# Patient Record
Sex: Male | Born: 1970 | Race: Black or African American | Hispanic: No | Marital: Single | Smoking: Current every day smoker
Health system: Southern US, Community
[De-identification: ages and names within clinical notes are randomized; demographics above are authoritative.]

## PROBLEM LIST (undated history)

## (undated) DIAGNOSIS — F1010 Alcohol abuse, uncomplicated: Secondary | ICD-10-CM

## (undated) DIAGNOSIS — T07XXXA Unspecified multiple injuries, initial encounter: Secondary | ICD-10-CM

## (undated) DIAGNOSIS — J449 Chronic obstructive pulmonary disease, unspecified: Secondary | ICD-10-CM

## (undated) DIAGNOSIS — N529 Male erectile dysfunction, unspecified: Secondary | ICD-10-CM

## (undated) DIAGNOSIS — R12 Heartburn: Secondary | ICD-10-CM

## (undated) DIAGNOSIS — J050 Acute obstructive laryngitis [croup]: Secondary | ICD-10-CM

## (undated) DIAGNOSIS — E785 Hyperlipidemia, unspecified: Secondary | ICD-10-CM

## (undated) DIAGNOSIS — Z720 Tobacco use: Secondary | ICD-10-CM

## (undated) DIAGNOSIS — F439 Reaction to severe stress, unspecified: Secondary | ICD-10-CM

## (undated) DIAGNOSIS — Z20828 Contact with and (suspected) exposure to other viral communicable diseases: Secondary | ICD-10-CM

## (undated) DIAGNOSIS — Z9111 Patient's noncompliance with dietary regimen: Secondary | ICD-10-CM

## (undated) DIAGNOSIS — I509 Heart failure, unspecified: Secondary | ICD-10-CM

## (undated) DIAGNOSIS — Z872 Personal history of diseases of the skin and subcutaneous tissue: Secondary | ICD-10-CM

## (undated) DIAGNOSIS — K30 Functional dyspepsia: Secondary | ICD-10-CM

## (undated) DIAGNOSIS — L0291 Cutaneous abscess, unspecified: Secondary | ICD-10-CM

## (undated) DIAGNOSIS — J40 Bronchitis, not specified as acute or chronic: Secondary | ICD-10-CM

## (undated) DIAGNOSIS — E119 Type 2 diabetes mellitus without complications: Secondary | ICD-10-CM

## (undated) DIAGNOSIS — Z91119 Patient's noncompliance with dietary regimen due to unspecified reason: Secondary | ICD-10-CM

## (undated) DIAGNOSIS — L700 Acne vulgaris: Secondary | ICD-10-CM

## (undated) DIAGNOSIS — R42 Dizziness and giddiness: Secondary | ICD-10-CM

## (undated) DIAGNOSIS — J45909 Unspecified asthma, uncomplicated: Secondary | ICD-10-CM

## (undated) DIAGNOSIS — L02215 Cutaneous abscess of perineum: Secondary | ICD-10-CM

## (undated) DIAGNOSIS — I10 Essential (primary) hypertension: Secondary | ICD-10-CM

## (undated) DIAGNOSIS — E669 Obesity, unspecified: Secondary | ICD-10-CM

## (undated) DIAGNOSIS — R9431 Abnormal electrocardiogram [ECG] [EKG]: Secondary | ICD-10-CM

## (undated) HISTORY — DX: Obesity, unspecified: E66.9

## (undated) HISTORY — DX: Acute obstructive laryngitis (croup): J05.0

## (undated) HISTORY — DX: Reaction to severe stress, unspecified: F43.9

## (undated) HISTORY — DX: Tobacco use: Z72.0

## (undated) HISTORY — DX: Male erectile dysfunction, unspecified: N52.9

## (undated) HISTORY — DX: Contact with and (suspected) exposure to other viral communicable diseases: Z20.828

## (undated) HISTORY — DX: Type 2 diabetes mellitus without complications: E11.9

## (undated) HISTORY — DX: Essential (primary) hypertension: I10

## (undated) HISTORY — DX: Abnormal electrocardiogram (ECG) (EKG): R94.31

## (undated) HISTORY — DX: Patient's noncompliance with dietary regimen: Z91.11

## (undated) HISTORY — DX: Patient's noncompliance with dietary regimen due to unspecified reason: Z91.119

## (undated) HISTORY — DX: Cutaneous abscess of perineum: L02.215

## (undated) HISTORY — DX: Heartburn: R12

## (undated) HISTORY — DX: Personal history of diseases of the skin and subcutaneous tissue: Z87.2

## (undated) HISTORY — DX: Dizziness and giddiness: R42

## (undated) HISTORY — DX: Unspecified asthma, uncomplicated: J45.909

## (undated) HISTORY — DX: Chronic obstructive pulmonary disease, unspecified: J44.9

## (undated) HISTORY — DX: Unspecified multiple injuries, initial encounter: T07.XXXA

## (undated) HISTORY — DX: Acne vulgaris: L70.0

## (undated) HISTORY — DX: Bronchitis, not specified as acute or chronic: J40

## (undated) HISTORY — DX: Cutaneous abscess, unspecified: L02.91

## (undated) HISTORY — DX: Alcohol abuse, uncomplicated: F10.10

## (undated) HISTORY — DX: Functional dyspepsia: K30

## (undated) HISTORY — DX: Hyperlipidemia, unspecified: E78.5

---

## 1997-09-16 ENCOUNTER — Emergency Department (HOSPITAL_COMMUNITY): Admission: EM | Admit: 1997-09-16 | Discharge: 1997-09-16 | Payer: Self-pay | Admitting: Emergency Medicine

## 1998-07-13 ENCOUNTER — Encounter: Payer: Self-pay | Admitting: Emergency Medicine

## 1998-07-13 ENCOUNTER — Inpatient Hospital Stay (HOSPITAL_COMMUNITY): Admission: EM | Admit: 1998-07-13 | Discharge: 1998-07-15 | Payer: Self-pay | Admitting: Emergency Medicine

## 1998-07-14 ENCOUNTER — Encounter: Payer: Self-pay | Admitting: Internal Medicine

## 1998-07-15 ENCOUNTER — Encounter: Payer: Self-pay | Admitting: Internal Medicine

## 1998-08-16 ENCOUNTER — Emergency Department (HOSPITAL_COMMUNITY): Admission: EM | Admit: 1998-08-16 | Discharge: 1998-08-16 | Payer: Self-pay | Admitting: Internal Medicine

## 1999-08-04 ENCOUNTER — Encounter: Admission: RE | Admit: 1999-08-04 | Discharge: 1999-11-02 | Payer: Self-pay | Admitting: Internal Medicine

## 2007-06-13 ENCOUNTER — Encounter: Admission: RE | Admit: 2007-06-13 | Discharge: 2007-06-13 | Payer: Self-pay | Admitting: Internal Medicine

## 2009-01-08 ENCOUNTER — Encounter (HOSPITAL_COMMUNITY): Admission: RE | Admit: 2009-01-08 | Discharge: 2009-03-14 | Payer: Self-pay | Admitting: Cardiology

## 2010-02-21 ENCOUNTER — Emergency Department (HOSPITAL_COMMUNITY): Admission: EM | Admit: 2010-02-21 | Discharge: 2010-02-21 | Payer: Self-pay | Admitting: Emergency Medicine

## 2010-02-23 ENCOUNTER — Emergency Department (HOSPITAL_COMMUNITY): Admission: EM | Admit: 2010-02-23 | Discharge: 2010-02-23 | Payer: Self-pay | Admitting: Emergency Medicine

## 2010-04-20 ENCOUNTER — Emergency Department (HOSPITAL_COMMUNITY)
Admission: EM | Admit: 2010-04-20 | Discharge: 2010-04-20 | Payer: Self-pay | Source: Home / Self Care | Admitting: Emergency Medicine

## 2010-04-27 LAB — URINALYSIS, ROUTINE W REFLEX MICROSCOPIC
Bilirubin Urine: NEGATIVE
Hgb urine dipstick: NEGATIVE
Ketones, ur: NEGATIVE mg/dL
Leukocytes, UA: NEGATIVE
Nitrite: NEGATIVE
Protein, ur: 30 mg/dL — AB
Specific Gravity, Urine: 1.014 (ref 1.005–1.030)
Urine Glucose, Fasting: NEGATIVE mg/dL
Urobilinogen, UA: 0.2 mg/dL (ref 0.0–1.0)
pH: 6 (ref 5.0–8.0)

## 2010-04-27 LAB — CBC
HCT: 44.3 % (ref 39.0–52.0)
Hemoglobin: 15.2 g/dL (ref 13.0–17.0)
MCH: 31.3 pg (ref 26.0–34.0)
MCHC: 34.3 g/dL (ref 30.0–36.0)
MCV: 91.3 fL (ref 78.0–100.0)
Platelets: 325 10*3/uL (ref 150–400)
RBC: 4.85 MIL/uL (ref 4.22–5.81)
RDW: 13.5 % (ref 11.5–15.5)
WBC: 7.5 10*3/uL (ref 4.0–10.5)

## 2010-04-27 LAB — URINE MICROSCOPIC-ADD ON

## 2010-04-27 LAB — GLUCOSE, CAPILLARY
Glucose-Capillary: 142 mg/dL — ABNORMAL HIGH (ref 70–99)
Glucose-Capillary: 93 mg/dL (ref 70–99)

## 2010-04-27 LAB — DIFFERENTIAL
Basophils Absolute: 0 10*3/uL (ref 0.0–0.1)
Basophils Relative: 1 % (ref 0–1)
Eosinophils Absolute: 0.3 10*3/uL (ref 0.0–0.7)
Eosinophils Relative: 3 % (ref 0–5)
Lymphocytes Relative: 17 % (ref 12–46)
Lymphs Abs: 1.2 10*3/uL (ref 0.7–4.0)
Monocytes Absolute: 0.7 10*3/uL (ref 0.1–1.0)
Monocytes Relative: 10 % (ref 3–12)
Neutro Abs: 5.2 10*3/uL (ref 1.7–7.7)
Neutrophils Relative %: 70 % (ref 43–77)

## 2010-04-27 LAB — HEPATIC FUNCTION PANEL
ALT: 21 U/L (ref 0–53)
AST: 21 U/L (ref 0–37)
Albumin: 3.7 g/dL (ref 3.5–5.2)
Alkaline Phosphatase: 72 U/L (ref 39–117)
Bilirubin, Direct: 0.1 mg/dL (ref 0.0–0.3)
Indirect Bilirubin: 0.5 mg/dL (ref 0.3–0.9)
Total Bilirubin: 0.6 mg/dL (ref 0.3–1.2)
Total Protein: 7.9 g/dL (ref 6.0–8.3)

## 2010-04-27 LAB — POCT I-STAT, CHEM 8
BUN: 16 mg/dL (ref 6–23)
Calcium, Ion: 0.99 mmol/L — ABNORMAL LOW (ref 1.12–1.32)
Chloride: 105 mEq/L (ref 96–112)
Creatinine, Ser: 1 mg/dL (ref 0.4–1.5)
Glucose, Bld: 101 mg/dL — ABNORMAL HIGH (ref 70–99)
HCT: 47 % (ref 39.0–52.0)
Hemoglobin: 16 g/dL (ref 13.0–17.0)
Potassium: 4.6 mEq/L (ref 3.5–5.1)
Sodium: 135 mEq/L (ref 135–145)
TCO2: 29 mmol/L (ref 0–100)

## 2012-03-25 ENCOUNTER — Encounter (HOSPITAL_COMMUNITY): Payer: Self-pay | Admitting: Hematology

## 2012-03-26 ENCOUNTER — Encounter (HOSPITAL_COMMUNITY): Payer: Self-pay | Admitting: Internal Medicine

## 2012-05-19 ENCOUNTER — Encounter (HOSPITAL_COMMUNITY): Payer: Self-pay | Admitting: General Practice

## 2012-05-19 DIAGNOSIS — F439 Reaction to severe stress, unspecified: Secondary | ICD-10-CM

## 2012-05-19 DIAGNOSIS — I10 Essential (primary) hypertension: Secondary | ICD-10-CM | POA: Insufficient documentation

## 2012-05-19 DIAGNOSIS — E1165 Type 2 diabetes mellitus with hyperglycemia: Secondary | ICD-10-CM | POA: Insufficient documentation

## 2013-09-22 ENCOUNTER — Observation Stay (HOSPITAL_COMMUNITY)
Admission: EM | Admit: 2013-09-22 | Discharge: 2013-09-23 | Disposition: A | Discharge status: Home / Self Care | Payer: Self-pay | Attending: Internal Medicine | Admitting: Internal Medicine

## 2013-09-22 ENCOUNTER — Encounter (HOSPITAL_COMMUNITY): Payer: Self-pay | Admitting: Emergency Medicine

## 2013-09-22 ENCOUNTER — Emergency Department (HOSPITAL_COMMUNITY): Payer: Self-pay

## 2013-09-22 DIAGNOSIS — I10 Essential (primary) hypertension: Secondary | ICD-10-CM | POA: Diagnosis present

## 2013-09-22 DIAGNOSIS — IMO0002 Reserved for concepts with insufficient information to code with codable children: Secondary | ICD-10-CM | POA: Diagnosis present

## 2013-09-22 DIAGNOSIS — I5023 Acute on chronic systolic (congestive) heart failure: Principal | ICD-10-CM | POA: Diagnosis present

## 2013-09-22 DIAGNOSIS — E785 Hyperlipidemia, unspecified: Secondary | ICD-10-CM | POA: Insufficient documentation

## 2013-09-22 DIAGNOSIS — I160 Hypertensive urgency: Secondary | ICD-10-CM

## 2013-09-22 DIAGNOSIS — F1010 Alcohol abuse, uncomplicated: Secondary | ICD-10-CM | POA: Insufficient documentation

## 2013-09-22 DIAGNOSIS — F17200 Nicotine dependence, unspecified, uncomplicated: Secondary | ICD-10-CM | POA: Insufficient documentation

## 2013-09-22 DIAGNOSIS — J449 Chronic obstructive pulmonary disease, unspecified: Secondary | ICD-10-CM | POA: Insufficient documentation

## 2013-09-22 DIAGNOSIS — I509 Heart failure, unspecified: Secondary | ICD-10-CM | POA: Insufficient documentation

## 2013-09-22 DIAGNOSIS — R609 Edema, unspecified: Secondary | ICD-10-CM

## 2013-09-22 DIAGNOSIS — Z720 Tobacco use: Secondary | ICD-10-CM | POA: Diagnosis present

## 2013-09-22 DIAGNOSIS — J4489 Other specified chronic obstructive pulmonary disease: Secondary | ICD-10-CM | POA: Insufficient documentation

## 2013-09-22 DIAGNOSIS — Z91199 Patient's noncompliance with other medical treatment and regimen due to unspecified reason: Secondary | ICD-10-CM | POA: Insufficient documentation

## 2013-09-22 DIAGNOSIS — E1165 Type 2 diabetes mellitus with hyperglycemia: Secondary | ICD-10-CM | POA: Diagnosis present

## 2013-09-22 DIAGNOSIS — R0602 Shortness of breath: Secondary | ICD-10-CM

## 2013-09-22 DIAGNOSIS — IMO0001 Reserved for inherently not codable concepts without codable children: Secondary | ICD-10-CM | POA: Insufficient documentation

## 2013-09-22 DIAGNOSIS — N529 Male erectile dysfunction, unspecified: Secondary | ICD-10-CM | POA: Insufficient documentation

## 2013-09-22 DIAGNOSIS — Z9119 Patient's noncompliance with other medical treatment and regimen: Secondary | ICD-10-CM | POA: Insufficient documentation

## 2013-09-22 DIAGNOSIS — J811 Chronic pulmonary edema: Secondary | ICD-10-CM | POA: Diagnosis present

## 2013-09-22 LAB — CBC
HEMATOCRIT: 39 % (ref 39.0–52.0)
Hemoglobin: 13.3 g/dL (ref 13.0–17.0)
MCH: 32.3 pg (ref 26.0–34.0)
MCHC: 34.1 g/dL (ref 30.0–36.0)
MCV: 94.7 fL (ref 78.0–100.0)
Platelets: 361 10*3/uL (ref 150–400)
RBC: 4.12 MIL/uL — ABNORMAL LOW (ref 4.22–5.81)
RDW: 13.2 % (ref 11.5–15.5)
WBC: 8.2 10*3/uL (ref 4.0–10.5)

## 2013-09-22 LAB — BASIC METABOLIC PANEL
BUN: 13 mg/dL (ref 6–23)
CO2: 22 mEq/L (ref 19–32)
CREATININE: 0.89 mg/dL (ref 0.50–1.35)
Calcium: 8.9 mg/dL (ref 8.4–10.5)
Chloride: 95 mEq/L — ABNORMAL LOW (ref 96–112)
GLUCOSE: 221 mg/dL — AB (ref 70–99)
POTASSIUM: 4.7 meq/L (ref 3.7–5.3)
Sodium: 133 mEq/L — ABNORMAL LOW (ref 137–147)

## 2013-09-22 LAB — PRO B NATRIURETIC PEPTIDE: Pro B Natriuretic peptide (BNP): 1845 pg/mL — ABNORMAL HIGH (ref 0–125)

## 2013-09-22 LAB — GLUCOSE, CAPILLARY
GLUCOSE-CAPILLARY: 338 mg/dL — AB (ref 70–99)
Glucose-Capillary: 155 mg/dL — ABNORMAL HIGH (ref 70–99)

## 2013-09-22 LAB — TROPONIN I: Troponin I: <0.3 ng/mL (ref <0.30)

## 2013-09-22 LAB — I-STAT TROPONIN, ED: Troponin i, poc: 0.04 ng/mL (ref 0.00–0.08)

## 2013-09-22 LAB — HEMOGLOBIN A1C
Hgb A1c MFr Bld: 9.4 % — ABNORMAL HIGH (ref <5.7)
Mean Plasma Glucose: 223 mg/dL — ABNORMAL HIGH (ref <117)

## 2013-09-22 LAB — TSH: TSH: 2.48 u[IU]/mL (ref 0.350–4.500)

## 2013-09-22 MED ORDER — HYDRALAZINE HCL 20 MG/ML IJ SOLN
10.0000 mg | Freq: Once | INTRAMUSCULAR | Status: AC
  Administered 2013-09-22: 10 mg via INTRAVENOUS
  Filled 2013-09-22: qty 1

## 2013-09-22 MED ORDER — NITROGLYCERIN 0.4 MG SL SUBL: 0.4000 mg | SUBLINGUAL_TABLET | SUBLINGUAL | Status: DC | PRN

## 2013-09-22 MED ORDER — ASPIRIN 325 MG PO TABS
325.0000 mg | ORAL_TABLET | ORAL | Status: AC
  Administered 2013-09-22: 325 mg via ORAL
  Filled 2013-09-22: qty 1

## 2013-09-22 MED ORDER — CARVEDILOL 6.25 MG PO TABS
6.2500 mg | ORAL_TABLET | Freq: Two times a day (BID) | ORAL | Status: DC
  Administered 2013-09-22: 6.25 mg via ORAL
  Filled 2013-09-22 (×2): qty 1

## 2013-09-22 MED ORDER — ASPIRIN 81 MG PO CHEW
324.0000 mg | CHEWABLE_TABLET | ORAL | Status: AC
  Administered 2013-09-22: 324 mg via ORAL
  Filled 2013-09-22: qty 4

## 2013-09-22 MED ORDER — HEPARIN SODIUM (PORCINE) 5000 UNIT/ML IJ SOLN
5000.0000 [IU] | Freq: Three times a day (TID) | INTRAMUSCULAR | Status: DC
  Administered 2013-09-22 – 2013-09-23 (×2): 5000 [IU] via SUBCUTANEOUS
  Filled 2013-09-22 (×4): qty 1

## 2013-09-22 MED ORDER — PNEUMOCOCCAL VAC POLYVALENT 25 MCG/0.5ML IJ INJ
0.5000 mL | INJECTION | INTRAMUSCULAR | Status: AC
  Administered 2013-09-23: 0.5 mL via INTRAMUSCULAR
  Filled 2013-09-22: qty 0.5

## 2013-09-22 MED ORDER — NICOTINE 14 MG/24HR TD PT24
14.0000 mg | MEDICATED_PATCH | Freq: Every day | TRANSDERMAL | Status: DC
  Administered 2013-09-22 – 2013-09-23 (×2): 14 mg via TRANSDERMAL
  Filled 2013-09-22 (×2): qty 1

## 2013-09-22 MED ORDER — CARVEDILOL 12.5 MG PO TABS
12.5000 mg | ORAL_TABLET | Freq: Two times a day (BID) | ORAL | Status: DC
  Administered 2013-09-23: 12.5 mg via ORAL
  Filled 2013-09-22 (×3): qty 1

## 2013-09-22 MED ORDER — ASPIRIN EC 81 MG PO TBEC
81.0000 mg | DELAYED_RELEASE_TABLET | Freq: Every day | ORAL | Status: DC
  Administered 2013-09-23: 81 mg via ORAL
  Filled 2013-09-22: qty 1

## 2013-09-22 MED ORDER — INSULIN ASPART 100 UNIT/ML ~~LOC~~ SOLN
0.0000 [IU] | Freq: Three times a day (TID) | SUBCUTANEOUS | Status: DC
  Administered 2013-09-22: 7 [IU] via SUBCUTANEOUS
  Administered 2013-09-23: 5 [IU] via SUBCUTANEOUS

## 2013-09-22 MED ORDER — ASPIRIN 300 MG RE SUPP
300.0000 mg | RECTAL | Status: AC
  Filled 2013-09-22: qty 1

## 2013-09-22 MED ORDER — FUROSEMIDE 10 MG/ML IJ SOLN
40.0000 mg | Freq: Once | INTRAMUSCULAR | Status: AC
  Administered 2013-09-22: 40 mg via INTRAVENOUS
  Filled 2013-09-22: qty 4

## 2013-09-22 MED ORDER — LISINOPRIL 10 MG PO TABS
10.0000 mg | ORAL_TABLET | Freq: Every day | ORAL | Status: DC
  Administered 2013-09-22 – 2013-09-23 (×2): 10 mg via ORAL
  Filled 2013-09-22 (×2): qty 1

## 2013-09-22 MED ORDER — SPIRONOLACTONE 25 MG PO TABS
25.0000 mg | ORAL_TABLET | Freq: Every day | ORAL | Status: DC
  Administered 2013-09-22 – 2013-09-23 (×2): 25 mg via ORAL
  Filled 2013-09-22 (×2): qty 1

## 2013-09-22 MED ORDER — LISINOPRIL 10 MG PO TABS
10.0000 mg | ORAL_TABLET | Freq: Once | ORAL | Status: AC
  Administered 2013-09-22: 10 mg via ORAL
  Filled 2013-09-22: qty 1

## 2013-09-22 MED ORDER — FUROSEMIDE 20 MG PO TABS
20.0000 mg | ORAL_TABLET | Freq: Every day | ORAL | Status: DC
  Administered 2013-09-22 – 2013-09-23 (×2): 20 mg via ORAL
  Filled 2013-09-22 (×2): qty 1

## 2013-09-22 NOTE — H&P (Signed)
Date: 09/22/2013               Patient Name:  Austin Alexander MRN: [id number]  DOB: [birth year] Age / Sex: 43 y.o., male   PCP: No Pcp Per Patient              Medical Service: Internal Medicine Teaching Service              Attending Physician: Dr. Bartholomew Crews, MD    First Contact: Arrie Aran, MS 3 Pager: [phone]  Second Contact: Dr. Bing Neighbors Pager: [phone]  Third Contact Dr. Randell Loop Pager: [phone]       After Hours (After 5p/  First Contact Pager: [phone]  weekends / holidays): Second Contact Pager: [phone]   Chief Complaint: "Hard cough"  History of Present Illness:  Austin Alexander is an obese 63y gentlemen with h/o uncontrolled DM2, HTn, HLD, tobacco and alcohol abuse, who has not taken any medications for multiple weeks, and presented to the ED this morning after being woken up from sleep by a cough, felt short of breath, and became diaphoretic.  He was in his normal state of health when he went to sleep last night.  His cough was worsened by lying down, and SOB was worsening by activity.  He was found to be mildly hypoxic at about 95%, and at one reading dropped to 88%, at which point he was given 3L O2 via [place] with good response.  He was weaned back to RA shortly thereafter and remained stable at 94-95%.  He was also moderately hypertensive, with BP as high as sys 165 and dias 111. He was given a dose of IV lasix 40mg , and BPs are now trending down.  He reports improved SOB.    Meds: No current facility-administered medications for this encounter.   Current Outpatient Prescriptions  Medication Sig Dispense Refill  . glimepiride (AMARYL) 4 MG tablet Take 4 mg by mouth daily with breakfast. Take 1 tablet in the morning with breakfast or the first main meal      . Multiple Vitamins-Minerals (CENTRUM ADULTS PO) Take 1 tablet by mouth daily.        Allergies: Allergies as of 09/22/2013  . (No Known Allergies)   Past Medical History  Diagnosis Date  .  Bronchitis   . Croup   . Tobacco abuse   . Dizziness   . Abscess of perineum   . Heartburn   . Indigestion   . H/O folliculitis   . 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 Alcohol abuse   . Hypertension     variable in nature  . Hyperlipidemia   . Multiple excoriations     multifactorial in origin  . Mild obesity   . Dietary noncompliance     History of,  . Situational stress     secondary to financial issues  . Erectile dysfunction     Multifactorial. Improved with Cialis. New onset.  . Cystic acne     with acne keloidosis  . Diabetes mellitus without complication     Type 2, uncontrolled with questionable improvement  . 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 EKG, abnormal     History of, with negative cardiac evaluation by history.  [name] COPD (chronic obstructive pulmonary disease)     moderate obstruction with low vital capacity  . Asthma     History of,   History reviewed. No pertinent past  surgical history. History reviewed. No pertinent family history. History   Social History  . Marital Status: Single    Spouse Name: N/A    Number of Children: N/A  . Years of Education: N/A   Occupational History  . Not on file.   Social History Main Topics  . Smoking status: Current Every Day Smoker  . Smokeless tobacco: Not on file  . Alcohol Use: Yes  . Drug Use: Not on file  . Sexual Activity: Not on file   Other Topics Concern  . Not on file   Social History Narrative  . No narrative on file    Review of Systems:  Review of Systems  Constitutional: Negative for fever, weight loss and diaphoresis.  HENT: Positive for congestion.   Eyes: Negative for redness.  Respiratory: Positive for cough and shortness of breath. Negative for sputum production.   Cardiovascular: Negative for chest pain, palpitations, orthopnea and leg swelling.  Gastrointestinal: Negative for nausea, vomiting and abdominal pain.  Genitourinary:  Negative for dysuria and flank pain.  Neurological: Negative for dizziness and headaches.   Physical Exam: Blood pressure 151/106, pulse 109, temperature 98.1 F (36.7 C), temperature source Oral, resp. rate 19, SpO2 97.00%. Physical Exam  Constitutional: He is oriented to person, place, and time and well-developed, well-nourished, and in no distress.  Moderately obese  HENT:  Head: Normocephalic and atraumatic.  Nose: Nose normal.  Mouth/Throat: Oropharynx is clear and moist. No oropharyngeal exudate.  Eyes: Conjunctivae and EOM are normal. No scleral icterus.  Neck: Normal range of motion. Neck supple. No JVD present. No thyromegaly present.  Cardiovascular: Regular rhythm and normal heart sounds.   No murmur heard. Tachycardic to 110  Pulmonary/Chest: Effort normal. No respiratory distress. He has no wheezes.  Bilateral crackles  Abdominal: Soft. Bowel sounds are normal. He exhibits distension. He exhibits no mass. There is no tenderness. There is no rebound and no guarding.  Musculoskeletal: Normal range of motion. He exhibits no edema.  Lymphadenopathy:    He has no cervical adenopathy.  Neurological: He is alert and oriented to person, place, and time.  Skin: Skin is warm and dry. He is not diaphoretic.  Psychiatric: Affect normal.    Lab results: Results for Austin Alexander, Austin Alexander (MRN [id number]) as of 09/22/2013 08:04  Ref. Range 09/22/2013 03:42 09/22/2013 03:44  Sodium Latest Range: 137-147 mEq/L  133 (L)  Potassium Latest Range: 3.7-5.3 mEq/L  4.7  Chloride Latest Range: 96-112 mEq/L  95 (L)  CO2 Latest Range: 19-32 mEq/L  22  BUN Latest Range: 6-23 mg/dL  13  Creatinine Latest Range: 0.50-1.35 mg/dL  0.89  Calcium Latest Range: 8.4-10.5 mg/dL  8.9  GFR calc non Af Amer Latest Range: >90 mL/min  >90  GFR calc Af Amer Latest Range: >90 mL/min  >90  Glucose Latest Range: 70-99 mg/dL  221 (H)  Troponin i, poc Latest Range: 0.00-0.08 ng/mL 0.04   Pro B Natriuretic peptide (BNP)  Latest Range: 0-125 pg/mL  1845.0 (H)  WBC Latest Range: 4.0-10.5 K/uL  8.2  RBC Latest Range: 4.22-5.81 MIL/uL  4.12 (L)  Hemoglobin Latest Range: 13.0-17.0 g/dL  13.3  HCT Latest Range: 39.0-52.0 %  39.0  MCV Latest Range: 78.0-100.0 fL  94.7  MCH Latest Range: 26.0-34.0 pg  32.3  MCHC Latest Range: 30.0-36.0 g/dL  34.1  RDW Latest Range: 11.5-15.5 %  13.2  Platelets Latest Range: 150-400 K/uL  361   Imaging results:  Dg Chest 2 View  09/22/2013  CLINICAL DATA:  Shortness of breath and chest pain. History of smoking.  EXAM: CHEST  2 VIEW  COMPARISON:  Chest radiograph performed 04/20/2010  FINDINGS: The lungs are well expanded. Vascular congestion is noted, with increased interstitial markings, raising concern for mild pulmonary edema. No definite pleural effusion or pneumothorax is seen.  The heart is borderline normal in size. No acute osseous abnormalities are identified.  IMPRESSION: Vascular congestion, with increased interstitial markings, raising concern for mild pulmonary edema.   Electronically Signed   By: Garald Balding M.D.   On: 09/22/2013 06:37    Other results: EKG: Initial EKG showed sinus tachycardia, with new T wave abnormalities in lead I, avL.  Repeat EKG shows additional biphasic T wave in V5  Assessment & Plan by Problem: Active Problems:   Pulmonary edema   Acute on chronic heart failure  Acute on Chronic Heart Failure - Stress test from Sept 2010 Est EF = 46%.  Clinical signs of crackles on lung exam, tachycardia, history of acute SOB, diaphoresis, EKG changes in contiguous leads, necessitates r/o of cardiac ischemia.  It is reassuring that pt does not report CP.  BNP 1845, Troponins neg x2. - ASA 325mg  STAT  ?Hypertensive Urgency - Pt is just below borderline to meet criteria for urgency. He reports 2 weeks of HTN med noncompliance 2/2 financial considerations.  On exam, he is fluid overloaded with crackles at lung bases.  He has received 40mg  IV lasix and is  currently diuresing well with total 0.9L urine so far.  Plan to observe him for improvement of Sxs, and reassess the need for further diuresis. -hydralazine 10mg  -lisinopril 10mg   Pulmonary edema - Improved SOB reported; anticipate further improvement with current diuresis plan, and HTN control.   Health Maintenance - Pt states he does not have a PCP he sees regularly for his DM, HTN, HLD, but has seen Dr. Amadeo Garnet at the [facility] on [address]   Attempts to contact this clinic were unsuccessful.  Mr. Mcniff was encouraged to call at the next available business hour to set up an appointment, or walk-in to visit and discuss his chronic medical conditions.  In the mean-time, it would benefit him to come to a one-time visit in our clinic.    This is a Careers information officer Note.  The care of the patient was discussed with Dr. Denton Brick and the assessment and plan was formulated with their assistance.  Please see their note for official documentation of the patient encounter.   Signed: Barbie Haggis, Med Student 09/22/2013, 8:06 AM

## 2013-09-22 NOTE — ED Notes (Signed)
Attempted report 

## 2013-09-22 NOTE — ED Notes (Signed)
Per Patient: States he woke up from his sleep with SOB, patient reports associated chest tightness. Pt reports he has had a cough for several days. Pt is able to speak in complete sentences at this time. NAD at the moment.

## 2013-09-22 NOTE — H&P (Signed)
Date: 09/22/2013               Patient Name:  Austin Alexander MRN: [id number]  DOB: 01/04/1971 Age / Sex: 43 y.o., male   PCP: No Pcp Per Patient         Medical Service: Internal Medicine Teaching Service         Attending Physician: Dr. Bartholomew Crews, MD    First Contact: Dr. Denton Brick Pager: [phone]  Second Contact: Dr. Hayes Ludwig Pager: [phone]       After Hours (After 5p/  First Contact Pager: [phone]  weekends / holidays): Second Contact Pager: [phone]   Chief Complaint: Cough  History of Present Illness: 34 y o male with PMH of DM and HTN, presented today with c/o cough of 2 weeks duration, patient said he coughed and threw up the food he had recently eaten. Pt says he has been coughing at baseline, he calls it smokers cough, as he has been smoking cigarette for over 10 years, at least one pack per day. Cough- productive of whitish sputum, Pt denies SOB- says he can lie flat, denies leg swelling or increase in abdominal swelling, has tightness, but no change from prior he says. Pt denies Chest pain. As per Ed notes, pt came in with complaints of SOB, but at the tim eof our examination pt had already passed at least 940mls of urine, also documented was chest pain which pt denied having. Pt last saw his PCP 2 years ago, thinks he was placed on lisinopril- say the medication sounds familiar, is also on glipizide. Last refill of his  medications was a few months ago. Pt will like to continue following up with Dr Mardene Speak. [place]. Pt says his challenge is that he has no insurance and no money, otherwise he is ready to follow up as an outpatient.  Meds: No current facility-administered medications for this encounter.   Current Outpatient Prescriptions  Medication Sig Dispense Refill  . glimepiride (AMARYL) 4 MG tablet Take 4 mg by mouth daily with breakfast. Take 1 tablet in the morning with breakfast or the first main meal      . Multiple Vitamins-Minerals (CENTRUM ADULTS PO) Take  1 tablet by mouth daily.        Allergies: Allergies as of 09/22/2013  . (No Known Allergies)   Past Medical History  Diagnosis Date  . Bronchitis   . Croup   . Tobacco abuse   . Dizziness   . Abscess of perineum   . Heartburn   . Indigestion   . H/O folliculitis   . 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 Alcohol abuse   . Hypertension     variable in nature  . Hyperlipidemia   . Multiple excoriations     multifactorial in origin  . Mild obesity   . Dietary noncompliance     History of,  . Situational stress     secondary to financial issues  . Erectile dysfunction     Multifactorial. Improved with Cialis. New onset.  . Cystic acne     with acne keloidosis  . Diabetes mellitus without complication     Type 2, uncontrolled with questionable improvement  . 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 EKG, abnormal     History of, with negative cardiac evaluation by history.  [name] COPD (chronic obstructive pulmonary disease)     moderate obstruction with low vital capacity  .  Asthma     History of,   History reviewed. No pertinent past surgical history. History reviewed. No pertinent family history. History   Social History  . Marital Status: Single    Spouse Name: N/A    Number of Children: N/A  . Years of Education: N/A   Occupational History  . Not on file.   Social History Main Topics  . Smoking status: Current Every Day Smoker  . Smokeless tobacco: Not on file  . Alcohol Use: Yes  . Drug Use: Not on file  . Sexual Activity: Not on file   Other Topics Concern  . Not on file   Social History Narrative  . No narrative on file    Review of Systems: CONSTITUTIONAL- No Fever, or change in appetite. HEAD- No Headache or dizziness. Mouth/throat- No Sorethroat. RESPIRATORY- Some congestion from sleeping under a fan, he says. CARDIAC- No Palpitations or chest pain. GI- No vomiting, diarrhoea, constipation.  URINARY- No  dysuria.  NEUROLOGIC- No Numbness, syncope, seizures or burning.  Physical Exam: Blood pressure 137/98, pulse 105, temperature 98.1 F (36.7 C), temperature source Oral, resp. rate 14, SpO2 95.00%. GENERAL- alert, co-operative, appears as stated age, not in any distress, was on O2 by nasal canula- sat 97%- on 4.5L , turned off O2 sats ranged between- 93-97%, mostly- 95% . HEENT- Atraumatic, normocephalic, PERRL, EOMI, oral mucosa appears moist,poor dentition generally. No cervical LN enlargement, thyroid does not appear enlarged, neck supple. CARDIAC- Tachycardic- low 100s, regular, no murmurs, rubs or gallops. RESP- Moving equal volumes of air, bilateral basilar crackles, no wheezes.  ABDOMEN- Soft, nontender, no guarding or rebound, no palpable masses or organomegaly, bowel sounds present. BACK- Normal curvature of the spine no CVA tenderness. NEURO- No obvious Cr N abnormality, Normal strenght upper and lower extremities-. EXTREMITIES- pulse 2+, symmetric, no pedal edema. SKIN- Warm, dry, No rash or lesion. PSYCH- Normal mood and affect, appropriate thought content and speech.  Lab results: Basic Metabolic Panel:  Recent Labs  09/22/13 0344  NA 133*  K 4.7  CL 95*  CO2 22  GLUCOSE 221*  BUN 13  CREATININE 0.89  CALCIUM 8.9   CBC:  Recent Labs  09/22/13 0344  WBC 8.2  HGB 13.3  HCT 39.0  MCV 94.7  PLT 361   BNP:  Recent Labs  09/22/13 0344  PROBNP 1845.0*   Imaging results:  Dg Chest 2 View  09/22/2013   CLINICAL DATA:  Shortness of breath and chest pain. History of smoking.  EXAM: CHEST  2 VIEW  COMPARISON:  Chest radiograph performed 04/20/2010  FINDINGS: The lungs are well expanded. Vascular congestion is noted, with increased interstitial markings, raising concern for mild pulmonary edema. No definite pleural effusion or pneumothorax is seen.  The heart is borderline normal in size. No acute osseous abnormalities are identified.  IMPRESSION: Vascular  congestion, with increased interstitial markings, raising concern for mild pulmonary edema.   Electronically Signed   By: Garald Balding M.D.   On: 09/22/2013 06:37    Other results: EKG: rate- 123bpm, sinus tachycardia, large P waves- II, III, AVF, axis- normal, T wave inversions- 1 and AVL only. No ST Abnormalities. Compared to prior EKG- 2012- some t wave flattening- AVL, ? repol abnormalities. Repeat EKG in th eEd- T wave inversions prominent with some changes in V4 and V5.  Assessment & Plan by Problem:  Pulmonary edema- Likely from congestive heart failure which appears to be more of left heart failure at this time,  most likely from chronically uncontrolled HTN, due to medication noncompliance. This is consistent with pts SOB- present initially on admission and Cough, tachypnea and tachycardia, crackles present on exam, BNP- elevated- 1845, hyponatremia- na- 133( Hypervolemic hyponatremia). Chest Xray Suggestive of mild pulmonary edema. Pt weight was not taken in the Ed. But say his weight is about 220.  Pt was given 40mg  of IV lasix an hour before we saw him, and pt had already put out at least 1L of urine, with improvement of symptoms. Also consider ACS as provoking factor for CHF, EKG with some T wave inversions- Lat leads, still present on repeat after diuresis, not present in prior- 2012, also with c/o chest pain, documented by Ed physician- no family hx of heart attack, pt says he has had a stress tests before which was negative. I- stat troponin was negative. Pt kidney function is normal, with Normal, Cr and GFR.  - Admit to in pt- Tele - Monitor in and out - Bmet in the am. -  Troponin X1- negative, cycle trops.  - Close follow up with PCP is important. - EKG in the morning. - Aspirin 325 start and 81 daily. - TSH  Heart failure- No echo on file. Clinically appears to be R - Echo - Daily weights - frusemide- 20mg  daily.   Diabetes- Pts blood sugar on arrival in the ED- 221. Hx of  Diabetes. Home meds- Glimepiride- 4mg  daily. - SSI-  - HgBa1c. - Lipid profile  HTN- Blood pressure elevated- highest recorded in the Ed- 165/108.  Pt thinks he was on lisinopril in the past, likely, since he has diabetes. - Coreg- 6.25mg  daily.   Smoking- Pt ready to quit. Will like to ready nicotine patches.   DVT ppx-   Dispo: Disposition is deferred at this time, awaiting improvement of current medical problems.  The patient does have a current PCP (No Pcp Per Patient) and does need an [facility] hospital follow-up appointment after discharge.  The patient does not know have transportation limitations that hinder transportation to clinic appointments.  Signed: Jenetta Downer, MD 09/22/2013, 8:44 AM

## 2013-09-22 NOTE — Progress Notes (Signed)
Echo Lab  2D Echocardiogram completed.  [place], RDCS 09/22/2013 3:40 PM

## 2013-09-22 NOTE — ED Provider Notes (Addendum)
CSN: [id number]     Arrival date & time 09/22/13  0303 History   First MD Initiated Contact with Patient 09/22/13 0533     Chief Complaint  Patient presents with  . Chest Pain  . Shortness of Breath     (Consider location/radiation/quality/duration/timing/severity/associated sxs/prior Treatment) HPI  This patient is a pleasant 43 year old diabetic man who smokes about 1.5 cigarettes per day. He also has a history of hypertension and has been out of his medications for the past week. He comes in today with complaints of shortness of breath. He was feeling well last night when he went to sleep. However, he awoke shortly prior to arrival coughing and had persistent shortness of breath. He denies chest pain, fever, palpitations and wheezing. He denies history of similar symptoms.  Denies any recent cocaine use. He has no history of coronary artery disease. He has no risk factors or history for VTE. He feels more short of breath when he is up and ambulating. He has not had any lower extremity edema. No abdominal pain.  Past Medical History  Diagnosis Date  . Bronchitis   . Croup   . Tobacco abuse   . Dizziness   . Abscess of perineum   . Heartburn   . Indigestion   . H/O folliculitis   . 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 Alcohol abuse   . Hypertension     variable in nature  . Hyperlipidemia   . Multiple excoriations     multifactorial in origin  . Mild obesity   . Dietary noncompliance     History of,  . Situational stress     secondary to financial issues  . Erectile dysfunction     Multifactorial. Improved with Cialis. New onset.  . Cystic acne     with acne keloidosis  . Diabetes mellitus without complication     Type 2, uncontrolled with questionable improvement  . 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 EKG, abnormal     History of, with negative cardiac evaluation by history.  [name] COPD (chronic obstructive pulmonary disease)      moderate obstruction with low vital capacity  . Asthma     History of,   History reviewed. No pertinent past surgical history. History reviewed. No pertinent family history. History  Substance Use Topics  . Smoking status: Current Every Day Smoker  . Smokeless tobacco: Not on file  . Alcohol Use: Yes    Review of Systems Ten point review of symptoms performed and is negative with the exception of symptoms noted above.     Allergies  Review of patient's allergies indicates no known allergies.  Home Medications   Prior to Admission medications   Medication Sig Start Date End Date Taking? Authorizing Provider  glimepiride (AMARYL) 4 MG tablet Take 4 mg by mouth daily with breakfast. Take 1 tablet in the morning with breakfast or the first main meal   Yes Historical Provider, MD  Multiple Vitamins-Minerals (CENTRUM ADULTS PO) Take 1 tablet by mouth daily.   Yes Historical Provider, MD   BP 153/111  Pulse 114  Temp(Src) 98.1 F (36.7 C) (Oral)  Resp 21  SpO2 97% Physical Exam Gen: well developed and well nourished appearing Head: NCAT Eyes: PERL, EOMI Nose: no epistaixis or rhinorrhea Mouth/throat: mucosa is moist and pink Neck: supple, no stridor Lungs: sats 90% on RA, RR 24/min, CTA B, no wheezing, rhonchi or rales  CV: Rapid and regular with pulse of 112 beats per minute, no murmur,, good distal pulses.  Abd: soft, notender, nondistended Back: no ttp, no cva ttp Skin: warm and dry Ext: no edema, normal to inspection Neuro: CN ii-xii grossly intact, no focal deficits Psyche; normal affect,  calm and cooperative.  ED Course  Procedures (including critical care time)    Results for orders placed during the hospital encounter of 09/22/13 (from the past 24 hour(s))  I-STAT TROPOININ, ED     Status: None   Collection Time    09/22/13  3:42 AM      Result Value Ref Range   Troponin i, poc 0.04  0.00 - 0.08 ng/mL   Comment 3           CBC     Status: Abnormal    Collection Time    09/22/13  3:44 AM      Result Value Ref Range   WBC 8.2  4.0 - 10.5 K/uL   RBC 4.12 (*) 4.22 - 5.81 MIL/uL   Hemoglobin 13.3  13.0 - 17.0 g/dL   HCT 39.0  39.0 - 52.0 %   MCV 94.7  78.0 - 100.0 fL   MCH 32.3  26.0 - 34.0 pg   MCHC 34.1  30.0 - 36.0 g/dL   RDW 13.2  11.5 - 15.5 %   Platelets 361  150 - 400 K/uL  BASIC METABOLIC PANEL     Status: Abnormal   Collection Time    09/22/13  3:44 AM      Result Value Ref Range   Sodium 133 (*) 137 - 147 mEq/L   Potassium 4.7  3.7 - 5.3 mEq/L   Chloride 95 (*) 96 - 112 mEq/L   CO2 22  19 - 32 mEq/L   Glucose, Bld 221 (*) 70 - 99 mg/dL   BUN 13  6 - 23 mg/dL   Creatinine, Ser 0.89  0.50 - 1.35 mg/dL   Calcium 8.9  8.4 - 10.5 mg/dL   GFR calc non Af Amer >90  >90 mL/min   GFR calc Af Amer >90  >90 mL/min  PRO B NATRIURETIC PEPTIDE     Status: Abnormal   Collection Time    09/22/13  3:44 AM      Result Value Ref Range   Pro B Natriuretic peptide (BNP) 1845.0 (*) 0 - 125 pg/mL   EKG: sius tach, 123 bpm, no acute ischemic changes, normal intervals, normal axis, normal qrs complex  CRITICAL CARE Performed by: Elyn Peers   Total critical care time: 83m  Critical care time was exclusive of separately billable procedures and treating other patients.  Critical care was necessary to treat or prevent imminent or life-threatening deterioration.  Critical care was time spent personally by me on the following activities: development of treatment plan with patient and/or surrogate as well as nursing, discussions with consultants, evaluation of patient's response to treatment, examination of patient, obtaining history from patient or surrogate, ordering and performing treatments and interventions, ordering and review of laboratory studies, ordering and review of radiographic studies, pulse oximetry and re-evaluation of patient's condition.  MDM   DDX: pneumonia, pleural effusion, PE, pneumothorax, pulmonary edema.   CXR  consistent with pulmonary edema. This is supported by elevated BNP level and HTN. We are treating with hydralazine IV for afterload reduction and IV Lasix. IM on call paged to request admission.   Elyn Peers, MD 09/22/13 [phone]  Case discussed with IM resident  on call who has accepted the patient on behalf of Dr. Melina Copa.   Elyn Peers, MD 09/22/13 [phone]

## 2013-09-22 NOTE — H&P (Signed)
  Date: 09/22/2013  Patient name: Carina Alire  Medical record number: [id number]  Date of birth: Jan 14, 1971   I have seen and evaluated Johny Blamer and discussed their care with the Residency Team. Mr Lutes was at his son's college graduation the day prior to admission and was in normal health. He woke up suddenly coughing, which he states is not unusual for him as he has a smoker's cough, but then realized he had SOB which made him come to the ED. In the ED, he was found to be in mild HF and was tx with IV lasix and diuresed 900 cc urine. When we saw the pt, he had no dyspnea, was lying flat. He also denied CP. He states he can walk up 3 flights or stairs and lift 50 lbs without CP or dyspnea. He has not noticed any weight gain, edema. He had a stress test in 2010 that showed EF 46% and no ischemia. This was similar to results in 2000.  He has DM, non insulin dep, and HTN. He has not seen a physician in over one year. He cont to take his amaryl as he had refills.   Vitals were reviewed. H tach and regular. No MRG. Lungs good air flow with slight crackles R base. Abd + BS. Ext no edema.  Labs and imaging reviewed.  Assessment and Plan: I have seen and evaluated the patient as outlined above. I agree with the formulated Assessment and Plan as detailed in the residents' admission note, with the following changes:   1. Acute heart failure - his EF was slightly low in 2010 but he was never started on HF meds. He has responded well to diuresis and will be transitioned to PO. ACEI and BB will be added. Will get ECHO today. He will need education on daily weights and diet. He has F/U arranged next week with new MD.  2. DM II - agree with A1C. SSI. Likely will need metformin at D/C as I doubt his A1C is controlled.  3. HTN - to be D/C'd on ACEI and BB and lasix. He may need additinoal agents but will be F/U'd as outpt.  4,. Inverted T waves in lateral leads - pre-test prob for AMI is very low but  he needs R/O with Trop I.   Bartholomew Crews, MD 6/13/20151:46 PM

## 2013-09-22 NOTE — Consult Note (Signed)
Reason for Consult: New-onset congestive heart failure/atypical chest pain Referring Physician: Dr. Larey Dresser  Austin Alexander is an 43 y.o. male.  HPI: Patient is 43 year old male with past medical history significant for hypertensive heart disease with asymptomatic LV systolic dysfunction, type 2 diabetes mellitus, alcohol abuse, tobacco abuse, was admitted earlier this morning because of sudden onset of paroxysmal nocturnal dyspnea associated with coughing. Denies orthopnea or leg swelling. Also complains of vague pleuritic chest pain which lasted a few minutes. Denies any exertional angina states he is very active and lifts boxes up to 50 pounds without difficulty. Patient states he ran out of medication for last few weeks. Denies any palpitation lightheadedness or syncope. States has been drinking heavy 1 pint of hard liquor 4-5 days per week. And continues to smoke one pack per day. Patient had a stress test in 2000 and in 2010 which showed no evidence of ischemia with EF of 46%. Patient was noted to be in mild congestive heart failure requiring IV Lasix with improvement in his breathing. MI was ruled out by serial enzymes 2-D echo showed EF of approximately 30%. EKG done in the ER showed sinus tach with minor T wave inversion in high lateral leads. Patient presently denies any complaints and feels that all his symptoms are because of not taking his medications and drinking.  Past Medical History  Diagnosis Date  . Bronchitis   . Croup   . Tobacco abuse   . Dizziness   . Abscess of perineum   . Heartburn   . Indigestion   . H/O folliculitis   . 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 Alcohol abuse   . Hypertension     variable in nature  . Hyperlipidemia   . Multiple excoriations     multifactorial in origin  . Mild obesity   . Dietary noncompliance     History of,  . Situational stress     secondary to financial issues  . Erectile dysfunction     Multifactorial.  Improved with Cialis. New onset.  . Cystic acne     with acne keloidosis  . Diabetes mellitus without complication     Type 2, uncontrolled with questionable improvement  . 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 EKG, abnormal     History of, with negative cardiac evaluation by history.  [name] COPD (chronic obstructive pulmonary disease)     moderate obstruction with low vital capacity  . Asthma     History of,    History reviewed. No pertinent past surgical history.  History reviewed. No pertinent family history.  Social History:  reports that he has been smoking.  He does not have any smokeless tobacco history on file. He reports that he drinks alcohol. His drug history is not on file.  Allergies: No Known Allergies  Medications: I have reviewed the patient's current medications.  Results for orders placed during the hospital encounter of 09/22/13 (from the past 48 hour(s))  I-STAT TROPOININ, ED     Status: None   Collection Time    09/22/13  3:42 AM      Result Value Ref Range   Troponin i, poc 0.04  0.00 - 0.08 ng/mL   Comment 3            Comment: Due to the release kinetics of cTnI,     a negative result within the first hours     of the  onset of symptoms does not rule out     myocardial infarction with certainty.     If myocardial infarction is still suspected,     repeat the test at appropriate intervals.  CBC     Status: Abnormal   Collection Time    09/22/13  3:44 AM      Result Value Ref Range   WBC 8.2  4.0 - 10.5 K/uL   RBC 4.12 (*) 4.22 - 5.81 MIL/uL   Hemoglobin 13.3  13.0 - 17.0 g/dL   HCT 39.0  39.0 - 52.0 %   MCV 94.7  78.0 - 100.0 fL   MCH 32.3  26.0 - 34.0 pg   MCHC 34.1  30.0 - 36.0 g/dL   RDW 13.2  11.5 - 15.5 %   Platelets 361  150 - 400 K/uL  BASIC METABOLIC PANEL     Status: Abnormal   Collection Time    09/22/13  3:44 AM      Result Value Ref Range   Sodium 133 (*) 137 - 147 mEq/L   Potassium 4.7  3.7 - 5.3 mEq/L    Comment: HEMOLYSIS AT THIS LEVEL MAY AFFECT RESULT   Chloride 95 (*) 96 - 112 mEq/L   CO2 22  19 - 32 mEq/L   Glucose, Bld 221 (*) 70 - 99 mg/dL   BUN 13  6 - 23 mg/dL   Creatinine, Ser 0.89  0.50 - 1.35 mg/dL   Calcium 8.9  8.4 - 10.5 mg/dL   GFR calc non Af Amer >90  >90 mL/min   GFR calc Af Amer >90  >90 mL/min   Comment: (NOTE)     The eGFR has been calculated using the CKD EPI equation.     This calculation has not been validated in all clinical situations.     eGFR's persistently <90 mL/min signify possible Chronic Kidney     Disease.  PRO B NATRIURETIC PEPTIDE     Status: Abnormal   Collection Time    09/22/13  3:44 AM      Result Value Ref Range   Pro B Natriuretic peptide (BNP) 1845.0 (*) 0 - 125 pg/mL  TROPONIN I     Status: None   Collection Time    09/22/13 11:16 AM      Result Value Ref Range   Troponin I <0.30  <0.30 ng/mL   Comment:            Due to the release kinetics of cTnI,     a negative result within the first hours     of the onset of symptoms does not rule out     myocardial infarction with certainty.     If myocardial infarction is still suspected,     repeat the test at appropriate intervals.  GLUCOSE, CAPILLARY     Status: Abnormal   Collection Time    09/22/13  4:02 PM      Result Value Ref Range   Glucose-Capillary 338 (*) 70 - 99 mg/dL   Comment 1 Documented in Chart     Comment 2 Notify RN    TROPONIN I     Status: None   Collection Time    09/22/13  4:58 PM      Result Value Ref Range   Troponin I <0.30  <0.30 ng/mL   Comment:            Due to the release kinetics of cTnI,     a  negative result within the first hours     of the onset of symptoms does not rule out     myocardial infarction with certainty.     If myocardial infarction is still suspected,     repeat the test at appropriate intervals.  TSH     Status: None   Collection Time    09/22/13  4:58 PM      Result Value Ref Range   TSH 2.480  0.350 - 4.500 uIU/mL    Dg  Chest 2 View  09/22/2013   CLINICAL DATA:  Shortness of breath and chest pain. History of smoking.  EXAM: CHEST  2 VIEW  COMPARISON:  Chest radiograph performed 04/20/2010  FINDINGS: The lungs are well expanded. Vascular congestion is noted, with increased interstitial markings, raising concern for mild pulmonary edema. No definite pleural effusion or pneumothorax is seen.  The heart is borderline normal in size. No acute osseous abnormalities are identified.  IMPRESSION: Vascular congestion, with increased interstitial markings, raising concern for mild pulmonary edema.   Electronically Signed   By: Garald Balding M.D.   On: 09/22/2013 06:37    Review of Systems  Constitutional: Negative for fever and chills.  HENT: Negative for hearing loss.   Eyes: Negative for blurred vision, double vision and photophobia.  Respiratory: Positive for cough and shortness of breath. Negative for hemoptysis, sputum production and wheezing.   Cardiovascular: Positive for chest pain and PND. Negative for palpitations, orthopnea, claudication and leg swelling.  Gastrointestinal: Negative for nausea, vomiting and abdominal pain.  Genitourinary: Negative for dysuria.  Neurological: Negative for dizziness and headaches.   Blood pressure 152/109, pulse 104, temperature 97.8 F (36.6 C), temperature source Oral, resp. rate 20, height 6' (1.829 m), weight 101.197 kg (223 lb 1.6 oz), SpO2 100.00%. Physical Exam  Constitutional: He is oriented to person, place, and time.  HENT:  Head: Normocephalic and atraumatic.  Eyes: Conjunctivae are normal. Pupils are equal, round, and reactive to light. Left eye exhibits no discharge. No scleral icterus.  Neck: Normal range of motion. Neck supple. No JVD present. No tracheal deviation present. No thyromegaly present.  Cardiovascular: Normal rate and regular rhythm.   Murmur (soft systolic murmur and S3 gallop noted) heard. Respiratory: Effort normal and breath sounds normal. No  respiratory distress. He has no wheezes. He has no rales.  GI: Soft. Bowel sounds are normal. He exhibits no distension. There is no tenderness. There is no rebound.  Musculoskeletal: He exhibits no edema and no tenderness.  Neurological: He is alert and oriented to person, place, and time.    Assessment/Plan: Resolving decompensated congestive heart failure secondary to depressed LV systolic function probably secondary to hypertensive heart disease/alcohol abuse rule out ischemia Status post atypical chest pain Uncontrolled hypertension Type 2 diabetes mellitus Alcohol abuse Tobacco abuse Plan Agree with present management Increase carvedilol as per orders We'll add aldosterone inhibitor as per orders Discussed with patient regarding left cardiac cath as yet marked depressed LV systolic function versus repeating the stress test patient states he wants to be treated medically at this point and plans to take his medication regularly. Patient has been consult regarding diet smoking cessation and alcohol cessation. We'll repeat his 2-D echo her as outpatient in 2-3 months and increase his beta blockers and ACE inhibitors as blood pressure tolerates as outpatient. If his EF remains 35% refer him for ICD. Check lipid panel  Austin Alexander 09/22/2013, 7:02 PM

## 2013-09-23 DIAGNOSIS — E119 Type 2 diabetes mellitus without complications: Secondary | ICD-10-CM

## 2013-09-23 DIAGNOSIS — F17200 Nicotine dependence, unspecified, uncomplicated: Secondary | ICD-10-CM

## 2013-09-23 DIAGNOSIS — I509 Heart failure, unspecified: Secondary | ICD-10-CM

## 2013-09-23 LAB — BASIC METABOLIC PANEL
BUN: 12 mg/dL (ref 6–23)
CHLORIDE: 96 meq/L (ref 96–112)
CO2: 25 meq/L (ref 19–32)
CREATININE: 0.9 mg/dL (ref 0.50–1.35)
Calcium: 8.9 mg/dL (ref 8.4–10.5)
GFR calc Af Amer: >90 mL/min (ref >90)
GFR calc non Af Amer: >90 mL/min (ref >90)
GLUCOSE: 281 mg/dL — AB (ref 70–99)
Potassium: 4.5 mEq/L (ref 3.7–5.3)
Sodium: 135 mEq/L — ABNORMAL LOW (ref 137–147)

## 2013-09-23 LAB — LIPID PANEL
CHOL/HDL RATIO: 3.5 ratio
Cholesterol: 190 mg/dL (ref 0–200)
HDL: 55 mg/dL (ref >39)
LDL Cholesterol: 96 mg/dL (ref 0–99)
Triglycerides: 196 mg/dL — ABNORMAL HIGH (ref <150)
VLDL: 39 mg/dL (ref 0–40)

## 2013-09-23 LAB — GLUCOSE, CAPILLARY
GLUCOSE-CAPILLARY: 289 mg/dL — AB (ref 70–99)
GLUCOSE-CAPILLARY: 252 mg/dL — AB (ref 70–99)

## 2013-09-23 LAB — TROPONIN I

## 2013-09-23 MED ORDER — CARVEDILOL 12.5 MG PO TABS: 12.5000 mg | ORAL_TABLET | Freq: Two times a day (BID) | ORAL | Status: DC

## 2013-09-23 MED ORDER — LISINOPRIL 10 MG PO TABS: 10.0000 mg | ORAL_TABLET | Freq: Every day | ORAL | Status: DC

## 2013-09-23 MED ORDER — SIMVASTATIN 40 MG PO TABS: 40.0000 mg | ORAL_TABLET | Freq: Every day | ORAL | Status: AC

## 2013-09-23 MED ORDER — METFORMIN HCL 500 MG PO TABS: 500.0000 mg | ORAL_TABLET | Freq: Two times a day (BID) | ORAL | Status: DC

## 2013-09-23 MED ORDER — SPIRONOLACTONE 25 MG PO TABS: 25.0000 mg | ORAL_TABLET | Freq: Every day | ORAL | Status: DC

## 2013-09-23 MED ORDER — ASPIRIN 81 MG PO TBEC: 81.0000 mg | DELAYED_RELEASE_TABLET | Freq: Every day | ORAL | Status: DC

## 2013-09-23 NOTE — Progress Notes (Signed)
  I have seen and examined the patient, and reviewed the daily progress note by Arrie Aran, MS 3 and discussed the care of the patient with them. Please see my progress note from 09/23/2013 for further details regarding assessment and plan.    Signed:  Jenetta Downer, MD 09/23/2013, 12:46 PM

## 2013-09-23 NOTE — Discharge Summary (Addendum)
Name: Austin Alexander MRN: [id number] DOB: Jun 17, 1970 43 y.o. PCP: No Pcp Per Patient  Date of Admission: 09/22/2013  3:13 AM Date of Discharge: 09/25/2013 Attending Physician: No att. providers found  Discharge Diagnosis:  Principal Problem:   Acute on chronic heart failure Active Problems:   HTN (hypertension)   Uncontrolled diabetes mellitus   Pulmonary edema   Tobacco abuse  Discharge Medications:   Medication List    STOP taking these medications       glimepiride 4 MG tablet  Commonly known as:  AMARYL      TAKE these medications       aspirin 81 MG EC tablet  Take 1 tablet (81 mg total) by mouth daily.     carvedilol 12.5 MG tablet  Commonly known as:  COREG  Take 1 tablet (12.5 mg total) by mouth 2 (two) times daily with a meal.     CENTRUM ADULTS PO  Take 1 tablet by mouth daily.     lisinopril 10 MG tablet  Commonly known as:  PRINIVIL,ZESTRIL  Take 1 tablet (10 mg total) by mouth daily.     metFORMIN 500 MG tablet  Commonly known as:  GLUCOPHAGE  Take 1 tablet (500 mg total) by mouth 2 (two) times daily with a meal.     simvastatin 40 MG tablet  Commonly known as:  ZOCOR  Take 1 tablet (40 mg total) by mouth daily.     spironolactone 25 MG tablet  Commonly known as:  ALDACTONE  Take 1 tablet (25 mg total) by mouth daily.        Disposition and follow-up:   [name] was discharged from [facility] in Good condition.  At the hospital follow up visit please address:  1.  Smoking cessation. Compliance with all his medications- Diabetic, statin and HTN. Any features of fluid overload. Has pt followed up and established care with his PCP.   2.  Labs / imaging needed at time of follow-up: Bmet- K, as pt is on ACE-i and Spironolactone.  3.  Pending labs/ test needing follow-up: None  Follow-up Appointments: Follow-up Information   Follow up with [facility], MD. Schedule an appointment as soon as possible for a visit  on 09/28/2013. (Please schedule an appointment within a week.)    Specialty:  General Practice   Contact information:   [place] [id number] [phone]       Discharge Instructions: Discharge Instructions   Diet - low sodium heart healthy    Complete by:  As directed      Discharge instructions    Complete by:  As directed   We have prescribed several new medications for you.  For your blood pressure 2 medications- Coreg/carvedilol, One tablet  2ce a day  And lisinopril, take one tablet once a day.     For your diabetes- Metformin- one tablet 2ce a day.  For your heart failure- Spironolactone one tablet 1ce a day. This medication also helps your blood pressure.  For your cholesterol- Simvastatin- one tablet once a day, in the evening.  All these medications are very important, it is therefore very very  important you follow up with your primary care doctor within a week. These medication doses will be adjusted by your PCP.  We have attached some materials on smoking cessation, please read them, and follow up with your PCP, about stopping smoking.     Increase activity slowly    Complete  by:  As directed            Consultations: Treatment Team:  Clent Demark, MD  Procedures Performed:  Dg Chest 2 View  09/22/2013   CLINICAL DATA:  Shortness of breath and chest pain. History of smoking.  EXAM: CHEST  2 VIEW  COMPARISON:  Chest radiograph performed 04/20/2010  FINDINGS: The lungs are well expanded. Vascular congestion is noted, with increased interstitial markings, raising concern for mild pulmonary edema. No definite pleural effusion or pneumothorax is seen.  The heart is borderline normal in size. No acute osseous abnormalities are identified.  IMPRESSION: Vascular congestion, with increased interstitial markings, raising concern for mild pulmonary edema.   Electronically Signed   By: Garald Balding M.D.   On: 09/22/2013 06:37    2D Echo:  Study Conclusions  - Left ventricle: The cavity size was moderately dilated. Systolic function was severely reduced. The estimated ejection fraction was in the range of 25% to 30%. Regional wall motion abnormalities cannot be excluded. - Mitral valve: There was mild regurgitation. - Left atrium: The atrium was moderately to severely dilated. - Right ventricle: The cavity size was mildly dilated. Wall thickness was normal.  Cardiac Cath: None  Admission HPI: Chief Complaint: Cough and initial complaints of chest pain and SOB.   History of Present Illness: 86 y o male with PMH of DM and HTN, presented today with c/o cough of about 2 weeks duration, patient said he coughed and threw up the food he had recently eaten. Pt says he has been coughing at baseline, he calls it smokers cough, as he has been smoking cigarette for over 10 years, at least one pack per day. Cough- productive of whitish sputum, Pt says he initially had some SOB which he reported had resolved at the time of our evaluation, but pt said he could lie flat, denies leg swelling or increase in abdominal swelling, has tightness, but no change from prior he says. Pt initially came in with some complaints of Chest pain, but denied this at the time of our evaluation. As per Ed notes, pt came in with complaints of SOB, but at the time of our examination pt had already passed at least 967mls of urine, also documented was chest pain which pt denied having. No family hx of premature heart attacks. Pt last saw his PCP 2 years ago, thinks he was placed on lisinopril- say the medication sounds familiar, is also on glipizide. Last refill of his medications was a few months ago. Pt will like to continue following up with Dr Mardene Speak. [place]. Pt says his challenge is that he has no insurance and no money, otherwise he is ready to follow up as an outpatient.  Physical Exam:  Blood pressure 137/98, pulse 105, temperature 98.1 F (36.7 C), temperature  source Oral, resp. rate 14, SpO2 95.00%.  GENERAL- alert, co-operative, appears as stated age, not in any distress, was on O2 by nasal canula- sat 97%- on 4.5L , turned off O2 sats ranged between- 93-97%, mostly- 95% .  HEENT- Atraumatic, normocephalic, PERRL, EOMI, oral mucosa appears moist,poor dentition generally. No cervical LN enlargement, thyroid does not appear enlarged, neck supple.  CARDIAC- Tachycardic- low 100s, regular, no murmurs, rubs or gallops.  RESP- Moving equal volumes of air, bilateral basilar crackles, no wheezes.  ABDOMEN- Soft, nontender, no guarding or rebound, no palpable masses or organomegaly, bowel sounds present.  BACK- Normal curvature of the spine no CVA tenderness.  NEURO- No obvious  Cr N abnormality, Normal strenght upper and lower extremities-.  EXTREMITIES- pulse 2+, symmetric, no pedal edema.  SKIN- Warm, dry, No rash or lesion.  PSYCH- Normal mood and affect, appropriate thought content and speech.   Hospital Course by problem list:   Acute on Chronic CHF (Systolic) - With pulmonary edema- Pt presented with SOB, with Cough, with crackles on exam, had a transient supplemental oxygen requirement. BNP done- 1845, trops X3- negative, EKG- with T wave inversions in lateral Leads, more prominent on repeat after diuresis, Chest xray suggestive of mild congestion, Consequent echo showed EF- 25-30%, wall motion abnormalities not excluded. This was decreased compared to Nuc med stress test in 2010, with EF 40-45%. He was given 40mg  IV Lasix, with reported improvement of symptoms, and given 2-20mg  PO doses over the next 24 hours.  Cardiology was consulted, impression- Compensated congestive heart failure secondary to depressed LV systolic function probably secondary to hypertensive heart disease/alcohol abuse rule out ischemia and recommendations, based on pts preference was for medical therapy, with ACE-i, Spironolactone, and beta-blockers, repeat 2D Echo in 2-3 months, and   If EF <35% refer for ICD. On discharge, pts SOB had resolved, and did not require O2 at rest or with ambulation, He was discharged in stable condition, to follow up with cardiology on the 22nd of June-2015. With the following prescriptions: - ASA 81mg  daily, Coreg 12.5mg  BID, Lisinopril 10mg  daily, Spironolactone 25mg  daily (K- on discharge- 4.5).. Pt wanted to follow up with his previous PCP- Dr Amadeo Garnet. He was therefore instructed to call and follow up within a week.  Diabetes- Hg A1c found to be 9.4- 09/22/2013. He had previously been noncompliant on Glimepiride. As there was no clear contraindication to Metformin therapy, he was discharged on 500mg  BID, and told to discuss his diabetic regimen at his PCP follow-up visit. Given his CVD risk profile, high intensity statin therapy was indicated, with a goal of LDL<70. His LDL was found to be 96, TG- 196; discharged with simvastatin 40mg  daily.   HTN- BP on presentation was 165/108; acceptable level achieved on discharge- 140/94 after Lasix therapy and initiating blood pressure regimen described above (Lisinopril, Coreg, Spironolactone). Counseled that these medications may need to be titrated up at his outpatient PCP follow up visit.   Smoking cessation- Patient reported effort and desire to quit smoking. He was given Scientist, clinical (histocompatibility and immunogenetics).  Addendum- 09/30/2013- Acute on Chronic Systolic heart failure.  Discharge Vitals:   BP 148/94  Pulse 105  Temp(Src) 98.2 F (36.8 C) (Oral)  Resp 19  Ht 6' (1.829 m)  Wt 221 lb 8 oz (100.472 kg)  BMI 30.03 kg/m2  SpO2 100%  Discharge Labs:  No results found for this or any previous visit (from the past 24 hour(s)).  Signed: Jenetta Downer, MD 09/25/2013, 11:39 AM   Time Spent on Discharge: 35 minutes Services Ordered on Discharge: None Equipment Ordered on Discharge: None

## 2013-09-23 NOTE — Discharge Instructions (Signed)
Smoking Cessation Quitting smoking is important to your health and has many advantages. However, it is not always easy to quit since nicotine is a very addictive drug. Often times, people try 3 times or more before being able to quit. This document explains the best ways for you to prepare to quit smoking. Quitting takes hard work and a lot of effort, but you can do it. ADVANTAGES OF QUITTING SMOKING  You will live longer, feel better, and live better.  Your body will feel the impact of quitting smoking almost immediately.  Within 20 minutes, blood pressure decreases. Your pulse returns to its normal level.  After 8 hours, carbon monoxide levels in the blood return to normal. Your oxygen level increases.  After 24 hours, the chance of having a heart attack starts to decrease. Your breath, hair, and body stop smelling like smoke.  After 48 hours, damaged nerve endings begin to recover. Your sense of taste and smell improve.  After 72 hours, the body is virtually free of nicotine. Your bronchial tubes relax and breathing becomes easier.  After 2 to 12 weeks, lungs can hold more air. Exercise becomes easier and circulation improves.  The risk of having a heart attack, stroke, cancer, or lung disease is greatly reduced.  After 1 year, the risk of coronary heart disease is cut in half.  After 5 years, the risk of stroke falls to the same as a nonsmoker.  After 10 years, the risk of lung cancer is cut in half and the risk of other cancers decreases significantly.  After 15 years, the risk of coronary heart disease drops, usually to the level of a nonsmoker.  If you are pregnant, quitting smoking will improve your chances of having a healthy baby.  The people you live with, especially any children, will be healthier.  You will have extra money to spend on things other than cigarettes. QUESTIONS TO THINK ABOUT BEFORE ATTEMPTING TO QUIT You may want to talk about your answers with your  caregiver.  Why do you want to quit?  If you tried to quit in the past, what helped and what did not?  What will be the most difficult situations for you after you quit? How will you plan to handle them?  Who can help you through the tough times? Your family? Friends? A caregiver?  What pleasures do you get from smoking? What ways can you still get pleasure if you quit? Here are some questions to ask your caregiver:  How can you help me to be successful at quitting?  What medicine do you think would be best for me and how should I take it?  What should I do if I need more help?  What is smoking withdrawal like? How can I get information on withdrawal? GET READY  Set a quit date.  Change your environment by getting rid of all cigarettes, ashtrays, matches, and lighters in your home, car, or work. Do not let people smoke in your home.  Review your past attempts to quit. Think about what worked and what did not. GET SUPPORT AND ENCOURAGEMENT You have a better chance of being successful if you have help. You can get support in many ways.  Tell your family, friends, and co-workers that you are going to quit and need their support. Ask them not to smoke around you.  Get individual, group, or telephone counseling and support. Programs are available at [employer] and health centers. Call your local health department for  information about programs in your area.  Spiritual beliefs and practices may help some smokers quit.  Download a "quit meter" on your computer to keep track of quit statistics, such as how long you have gone without smoking, cigarettes not smoked, and money saved.  Get a self-help book about quitting smoking and staying off of tobacco. Chualar yourself from urges to smoke. Talk to someone, go for a walk, or occupy your time with a task.  Change your normal routine. Take a different route to work. Drink tea instead of coffee.  Eat breakfast in a different place.  Reduce your stress. Take a hot bath, exercise, or read a book.  Plan something enjoyable to do every day. Reward yourself for not smoking.  Explore interactive web-based programs that specialize in helping you quit. GET MEDICINE AND USE IT CORRECTLY Medicines can help you stop smoking and decrease the urge to smoke. Combining medicine with the above behavioral methods and support can greatly increase your chances of successfully quitting smoking.  Nicotine replacement therapy helps deliver nicotine to your body without the negative effects and risks of smoking. Nicotine replacement therapy includes nicotine gum, lozenges, inhalers, nasal sprays, and skin patches. Some may be available over-the-counter and others require a prescription.  Antidepressant medicine helps people abstain from smoking, but how this works is unknown. This medicine is available by prescription.  Nicotinic receptor partial agonist medicine simulates the effect of nicotine in your brain. This medicine is available by prescription. Ask your caregiver for advice about which medicines to use and how to use them based on your health history. Your caregiver will tell you what side effects to look out for if you choose to be on a medicine or therapy. Carefully read the information on the package. Do not use any other product containing nicotine while using a nicotine replacement product.  RELAPSE OR DIFFICULT SITUATIONS Most relapses occur within the first 3 months after quitting. Do not be discouraged if you start smoking again. Remember, most people try several times before finally quitting. You may have symptoms of withdrawal because your body is used to nicotine. You may crave cigarettes, be irritable, feel very hungry, cough often, get headaches, or have difficulty concentrating. The withdrawal symptoms are only temporary. They are strongest when you first quit, but they will go away within  10 14 days. To reduce the chances of relapse, try to:  Avoid drinking alcohol. Drinking lowers your chances of successfully quitting.  Reduce the amount of caffeine you consume. Once you quit smoking, the amount of caffeine in your body increases and can give you symptoms, such as a rapid heartbeat, sweating, and anxiety.  Avoid smokers because they can make you want to smoke.  Do not let weight gain distract you. Many smokers will gain weight when they quit, usually less than 10 pounds. Eat a healthy diet and stay active. You can always lose the weight gained after you quit.  Find ways to improve your mood other than smoking. FOR MORE INFORMATION  www.smokefree.gov

## 2013-09-23 NOTE — Progress Notes (Signed)
Subjective:  Patient denies any chest pain or shortness of breath. States feels much better since admission. Less palpitation lightheadedness or syncope. Denies PND orthopnea or leg swelling.  Objective:  Vital Signs in the last 24 hours: Temp:  [97.8 F (36.6 C)-99.3 F (37.4 C)] 98.2 F (36.8 C) (06/14 0523) Pulse Rate:  [98-108] 105 (06/14 0700) Resp:  [16-31] 19 (06/14 0523) BP: (117-156)/(79-112) 148/94 mmHg (06/14 0700) SpO2:  [95 %-100 %] 100 % (06/14 0523) Weight:  [100.472 kg (221 lb 8 oz)-101.197 kg (223 lb 1.6 oz)] 100.472 kg (221 lb 8 oz) (06/14 0523)  Intake/Output from previous day: 06/13 0701 - 06/14 0700 In: 240 [P.O.:240] Out: 4350 [Urine:4350] Intake/Output from this shift: Total I/O In: 240 [P.O.:240] Out: -   Physical Exam: Neck: no adenopathy, no carotid bruit, no JVD and supple, symmetrical, trachea midline Lungs: clear to auscultation bilaterally Heart: regular rate and rhythm, S1, S2 normal and Soft systolic murmur noted no history gallop Abdomen: soft, non-tender; bowel sounds normal; no masses,  no organomegaly Extremities: extremities normal, atraumatic, no cyanosis or edema  Lab Results:  Recent Labs  09/22/13 0344  WBC 8.2  HGB 13.3  PLT 361    Recent Labs  09/22/13 0344 09/23/13 0840  NA 133* 135*  K 4.7 4.5  CL 95* 96  CO2 22 25  GLUCOSE 221* 281*  BUN 13 12  CREATININE 0.89 0.90    Recent Labs  09/22/13 1658 09/22/13 2320  TROPONINI <0.30 <0.30   Hepatic Function Panel No results found for this basename: PROT, ALBUMIN, AST, ALT, ALKPHOS, BILITOT, BILIDIR, IBILI,  in the last 72 hours  Recent Labs  09/23/13 0438  CHOL 190   No results found for this basename: PROTIME,  in the last 72 hours  Imaging: Imaging results have been reviewed and Dg Chest 2 View  09/22/2013   CLINICAL DATA:  Shortness of breath and chest pain. History of smoking.  EXAM: CHEST  2 VIEW  COMPARISON:  Chest radiograph performed 04/20/2010   FINDINGS: The lungs are well expanded. Vascular congestion is noted, with increased interstitial markings, raising concern for mild pulmonary edema. No definite pleural effusion or pneumothorax is seen.  The heart is borderline normal in size. No acute osseous abnormalities are identified.  IMPRESSION: Vascular congestion, with increased interstitial markings, raising concern for mild pulmonary edema.   Electronically Signed   By: Garald Balding M.D.   On: 09/22/2013 06:37    Cardiac Studies:  Assessment/Plan:  Compensated congestive heart failure secondary to depressed LV systolic function probably secondary to hypertensive heart disease/alcohol abuse rule out ischemia  Status post atypical chest pain  Uncontrolled hypertension  Type 2 diabetes mellitus  Alcohol abuse  Tobacco abuse  Plan Okay to discharge home Will up titrate beta blockers and ACE inhibitor  as outpatient as tolerated. Will arrange for 2-D echo in to 3 months as outpatient hopefully his LV function will improve. Discussed again with patient regarding lifestyle modification smoking cessation and alcohol cessation and diet. Heart failure instructions also have been given.  LOS: 1 day    Terasa Orsini N 09/23/2013, 11:14 AM

## 2013-09-23 NOTE — H&P (Signed)
  I have seen and examined the patient, and reviewed the daily progress note by Arrie Aran, MS 3 and discussed the care of the patient with them. Please see my progress note from 09/23/2013 for further details regarding assessment and plan.    Signed:  Jenetta Downer, MD 09/23/2013, 12:45 PM

## 2013-09-23 NOTE — Progress Notes (Signed)
Heart Failure Navigator Consult Note  Presentation: Austin Alexander is a 43 year old male---was at his son's college graduation the day prior to admission and was in normal health. He woke up suddenly coughing, which he states is not unusual for him as he has a smoker's cough, but then realized he had SOB which made him come to the ED. In the ED, he was found to be in mild HF and was tx with IV lasix and diuresed 900 cc urine. When we saw the pt, he had no dyspnea, was lying flat. He also denied CP. He states he can walk up 3 flights or stairs and lift 50 lbs without CP or dyspnea. He has not noticed any weight gain, edema. He had a stress test in 2010 that showed EF 46% and no ischemia. This was similar to results in 2000.   Past Medical History  Diagnosis Date  . Bronchitis   . Croup   . Tobacco abuse   . Dizziness   . Abscess of perineum   . Heartburn   . Indigestion   . H/O folliculitis   . 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 Alcohol abuse   . Hypertension     variable in nature  . Hyperlipidemia   . Multiple excoriations     multifactorial in origin  . Mild obesity   . Dietary noncompliance     History of,  . Situational stress     secondary to financial issues  . Erectile dysfunction     Multifactorial. Improved with Cialis. New onset.  . Cystic acne     with acne keloidosis  . Diabetes mellitus without complication     Type 2, uncontrolled with questionable improvement  . 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 EKG, abnormal     History of, with negative cardiac evaluation by history.  [name] COPD (chronic obstructive pulmonary disease)     moderate obstruction with low vital capacity  . Asthma     History of,    History   Social History  . Marital Status: Single    Spouse Name: N/A    Number of Children: N/A  . Years of Education: N/A   Social History Main Topics  . Smoking status: Current Every Day Smoker  . Smokeless  tobacco: None  . Alcohol Use: Yes  . Drug Use: None  . Sexual Activity: None   Other Topics Concern  . None   Social History Narrative  . None    ECHO:Study Conclusions--09/12/13  - Left ventricle: The cavity size was moderately dilated. Systolic function was severely reduced. The estimated ejection fraction was in the range of 25% to 30%. Regional wall motion abnormalities cannot be excluded. - Mitral valve: There was mild regurgitation. - Left atrium: The atrium was moderately to severely dilated. - Right ventricle: The cavity size was mildly dilated. Wall thickness was normal.  Transthoracic echocardiography. M-mode, complete 2D, spectral Doppler, and color Doppler. Birthdate: Patient birthdate: Feb 06, 1971. Age: Patient is 43 yr old. Sex: Gender: male. Blood pressure: 127/87 Patient status: Inpatient. Study date: Study date: 09/22/2013. Study time: 03:30 PM. Location: Bedside.   BNP    Component Value Date/Time   PROBNP 1845.0* 09/22/2013 0344    Education Assessment and Provision:  Detailed education and instructions provided on heart failure disease management including the following:  Signs and symptoms of Heart Failure When to call the physician Importance of daily  weights Low sodium diet Fluid restriction Medication management Anticipated future follow-up appointments  Patient education given on each of the above topics.  Patient acknowledges understanding and acceptance of all instructions.  He seems open to the topics described above.  We briefly discussed his history of hypertension and how that can play a role in HF.  He does acknowledge issues with medication compliance in the past and not doing as he "was supposed to".  I reinforced the importance of taking medications exactly as prescribed.  I also encouraged him to get a scale to do daily weights.    Education Materials:  "Living Better With Heart Failure" Booklet, Daily Weight Tracker Tool and Heart  Failure Educational Video.   High Risk Criteria for Readmission and/or Poor Patient Outcomes:   EF <30%- Yes 25-30%   2 or more admissions in 6 months- No  Difficult social situation- No-  Demonstrates medication noncompliance- Yes--says he has not always taken in the past.    Barriers of Care:  Knowledge of HF and compliance to recommendations  Discharge Planning:   Plans to discharge to home alone--girlfriend is there some.  He says he plans to follow-up with Dr. Terrence Dupont at discharge.  No appointment noted as of yet.  He may benefit from outpatient follow-up with the HF clinic --as affording medications may be an issue.  He could also benefit from seeing the outpatient HF SW.

## 2013-09-23 NOTE — Progress Notes (Signed)
Subjective: No complaints today. Seen by cardiology yesterday. Pt feels okay, ready to go home. Says she will follow up as needed with his PCP.   Objective: Vital signs in last 24 hours: Filed Vitals:   09/22/13 2127 09/23/13 0224 09/23/13 0523 09/23/13 0700  BP: 132/95 141/104 148/105 148/94  Pulse: 99 108 106 105  Temp: 98.8 F (37.1 C) 99.3 F (37.4 C) 98.2 F (36.8 C)   TempSrc: Oral Oral Oral   Resp: 18 19 19    Height:      Weight:   221 lb 8 oz (100.472 kg)   SpO2: 99% 99% 100%    Weight change:   Intake/Output Summary (Last 24 hours) at 09/23/13 0938 Last data filed at 09/23/13 0831  Gross per 24 hour  Intake    480 ml  Output   2550 ml  Net  -2070 ml   General appearance: alert, cooperative and no distress Head: Normocephalic, without obvious abnormality, atraumatic Lungs: clear to auscultation bilaterally Heart: regular rate and rhythm, S1, S2 normal, no murmur, click, rub or gallop Abdomen: soft, non-tender; bowel sounds normal; no masses,  no organomegaly Extremities: extremities normal, atraumatic, no cyanosis or edema Pulses: 2+ and symmetric Lab Results: Basic Metabolic Panel:  Recent Labs Lab 09/22/13 0344 09/23/13 0840  NA 133* 135*  K 4.7 4.5  CL 95* 96  CO2 22 25  GLUCOSE 221* 281*  BUN 13 12  CREATININE 0.89 0.90  CALCIUM 8.9 8.9   CBC:  Recent Labs Lab 09/22/13 0344  WBC 8.2  HGB 13.3  HCT 39.0  MCV 94.7  PLT 361   Cardiac Enzymes:  Recent Labs Lab 09/22/13 1116 09/22/13 1658 09/22/13 2320  TROPONINI <0.30 <0.30 <0.30   BNP:  Recent Labs Lab 09/22/13 0344  PROBNP 1845.0*   CBG:  Recent Labs Lab 09/22/13 1602 09/22/13 2006 09/23/13 0708  GLUCAP 338* 155* 289*   Hemoglobin A1C:  Recent Labs Lab 09/22/13 1658  HGBA1C 9.4*   Fasting Lipid Panel:  Recent Labs Lab 09/23/13 0438  CHOL 190  HDL 55  LDLCALC 96  TRIG 196*  CHOLHDL 3.5   Thyroid Function Tests:  Recent Labs Lab 09/22/13 1658    TSH 2.480   Micro Results: No results found for this or any previous visit (from the past 240 hour(s)). Studies/Results: Dg Chest 2 View  09/22/2013   CLINICAL DATA:  Shortness of breath and chest pain. History of smoking.  EXAM: CHEST  2 VIEW  COMPARISON:  Chest radiograph performed 04/20/2010  FINDINGS: The lungs are well expanded. Vascular congestion is noted, with increased interstitial markings, raising concern for mild pulmonary edema. No definite pleural effusion or pneumothorax is seen.  The heart is borderline normal in size. No acute osseous abnormalities are identified.  IMPRESSION: Vascular congestion, with increased interstitial markings, raising concern for mild pulmonary edema.   Electronically Signed   By: Garald Balding M.D.   On: 09/22/2013 06:37   Medications: I have reviewed the patient's current medications. Scheduled Meds: . aspirin EC  81 mg Oral Daily  . carvedilol  12.5 mg Oral BID WC  . furosemide  20 mg Oral Daily  . heparin  5,000 Units Subcutaneous 3 times per day  . insulin aspart  0-9 Units Subcutaneous TID WC  . lisinopril  10 mg Oral Daily  . nicotine  14 mg Transdermal Daily  . pneumococcal 23 valent vaccine  0.5 mL Intramuscular Tomorrow-1000  . spironolactone  25 mg Oral Daily  Continuous Infusions:  PRN Meds:.nitroGLYCERIN Assessment/Plan:  Acute on Chronic CHF- Echo done yesterday- EF- 25-30%, wall motion abnormalities not excluded. Seen by cards yesterday. Cards recs- Repeat 2D Echo, as pt has elected for medical therapy. If EF <35% refer for ICD. Follow up with cards in 2-3 months. Pulmonary edema has resolved - Discharge home today  - Cards recs appreciated- Pt to be discharged home with spironolactone, lisinopril and Coreg, to be up titrated as an out-pt.  - K- 4.5 today, this should be followed up closely. - Cont Aspirin- 81mg  daily  Diabetes- HgBa1c- 9.4 09/22/2013. Previously on Glimeperide. - Discharge home on Metformin- 500mg  BID. -  LDL- 96, not at goal, will start statin- Simvastatin at 40mg  daily.  HTN- Acceptable Bp on discharge- 140/94. But now on 3 blood pressure medications. - Titrate as out-pt.  - Discharge home on Lisinopril 10mg  daily, Coreg- 6.25mg  daily, and spironolactone- 25mg  daily.  Smoking cessation- Scientist, clinical (histocompatibility and immunogenetics) given.   Dispo: Disposition is deferred at this time, awaiting improvement of current medical problems.  Anticipated discharge in approximately today. The patient does have a current PCP (No Pcp Per Patient) and does need an [facility] hospital follow-up appointment after discharge.  The patient does not know have transportation limitations that hinder transportation to clinic appointments.  .Services Needed at time of discharge: Y = Yes, Blank = No PT:   OT:   RN:   Equipment:   Other:     LOS: 1 day   Jenetta Downer, MD 09/23/2013, 9:38 AM

## 2013-09-23 NOTE — Progress Notes (Signed)
Utilization Review Completed.Austin Alexander T6/14/2015  

## 2013-09-23 NOTE — Progress Notes (Signed)
Subjective: Austin Alexander was feeling great this morning, with no SOB.  Spoke with Dr. Terrence Dupont yesterday regarding therapeutic options for his reduced EF.  Would prefer to manage medically and follow up in clinic.  Pending lab results, he can go home today.  Objective: Vital signs in last 24 hours: Filed Vitals:   09/22/13 2127 09/23/13 0224 09/23/13 0523 09/23/13 0700  BP: 132/95 141/104 148/105 148/94  Pulse: 99 108 106 105  Temp: 98.8 F (37.1 C) 99.3 F (37.4 C) 98.2 F (36.8 C)   TempSrc: Oral Oral Oral   Resp: 18 19 19    Height:      Weight:   100.472 kg (221 lb 8 oz)   SpO2: 99% 99% 100%    Weight change:   Intake/Output Summary (Last 24 hours) at 09/23/13 0943 Last data filed at 09/23/13 0831  Gross per 24 hour  Intake    480 ml  Output   2050 ml  Net  -1570 ml   Physical Exam  Constitutional: He is oriented to person, place, and time and well-developed, well-nourished, and in no distress.  Eyes: Conjunctivae are normal.  Cardiovascular: Normal rate, regular rhythm and normal heart sounds.  Exam reveals no gallop and no friction rub.   No murmur heard. Pulmonary/Chest: Effort normal and breath sounds normal.  Neurological: He is alert and oriented to person, place, and time.  Skin: Skin is warm and dry.   Lab Results: Results for TEDRIC, COLLINGWOOD (MRN [id number]) as of 09/23/2013 09:58  Ref. Range 09/22/2013 03:44 09/23/2013 08:40  Sodium Latest Range: 137-147 mEq/L 133 (L) 135 (L)  Potassium Latest Range: 3.7-5.3 mEq/L 4.7 4.5  Chloride Latest Range: 96-112 mEq/L 95 (L) 96  CO2 Latest Range: 19-32 mEq/L 22 25  BUN Latest Range: 6-23 mg/dL 13 12  Creatinine Latest Range: 0.50-1.35 mg/dL 0.89 0.90  Calcium Latest Range: 8.4-10.5 mg/dL 8.9 8.9  GFR calc non Af Amer Latest Range: >90 mL/min >90 >90  GFR calc Af Amer Latest Range: >90 mL/min >90 >90  Glucose Latest Range: 70-99 mg/dL 221 (H) 281 (H)   Results for COBEE, MIAO (MRN [id number]) as of 09/23/2013 09:58  Ref. Range 09/23/2013 04:38  Cholesterol Latest Range: 0-200 mg/dL 190  Triglycerides Latest Range: <150 mg/dL 196 (H)  HDL Latest Range: >39 mg/dL 55  LDL (calc) Latest Range: 0-99 mg/dL 96  VLDL Latest Range: 0-40 mg/dL 39  Total CHOL/HDL Ratio No range found 3.5    Hemoglobin A1C Latest Range: <5.7 % 9.4 (H)   TSH Latest Range: 0.350-4.500 uIU/mL 2.480    Ref. Range 09/22/2013 11:16 09/22/2013 16:58 09/22/2013 23:20  Troponin I Latest Range: <0.30 ng/mL <0.30 <0.30 <0.30    Micro Results: No results found for this or any previous visit (from the past 240 hour(s)). Studies/Results: Dg Chest 2 View  09/22/2013   CLINICAL DATA:  Shortness of breath and chest pain. History of smoking.  EXAM: CHEST  2 VIEW  COMPARISON:  Chest radiograph performed 04/20/2010  FINDINGS: The lungs are well expanded. Vascular congestion is noted, with increased interstitial markings, raising concern for mild pulmonary edema. No definite pleural effusion or pneumothorax is seen.  The heart is borderline normal in size. No acute osseous abnormalities are identified.  IMPRESSION: Vascular congestion, with increased interstitial markings, raising concern for mild pulmonary edema.   Electronically Signed   By: Garald Balding M.D.   On: 09/22/2013 06:37   Medications: I have reviewed the patient's current medications. Scheduled Meds: . aspirin  EC  81 mg Oral Daily  . carvedilol  12.5 mg Oral BID WC  . furosemide  20 mg Oral Daily  . heparin  5,000 Units Subcutaneous 3 times per day  . insulin aspart  0-9 Units Subcutaneous TID WC  . lisinopril  10 mg Oral Daily  . nicotine  14 mg Transdermal Daily  . pneumococcal 23 valent vaccine  0.5 mL Intramuscular Tomorrow-1000  . spironolactone  25 mg Oral Daily   Continuous Infusions:  PRN Meds:.nitroGLYCERIN Assessment/Plan: Principal Problem:   Acute on chronic heart failure Active Problems:   HTN (hypertension)   Uncontrolled diabetes mellitus   Pulmonary edema    Tobacco abuse  Acute on Chronic Heart Failure - Stress test from Sept 2010 Est EF = 46%. 2D Echo yesterday ef: 20-25%. Pt spoke with cardiologist yesterday PM, re: medical mgmt vs. Heart cath.  Pt prefers medical mgmt with close follow up in outpt setting. Troponins neg x3.  - Dr Terrence Dupont, cardiologist, consulted, appreciate guidance.  Recommends:  - ASA 81mg  daily - Coreg 12.5mg  BID - Lisinopril 10mg  daily - Spironolactone 25mg  daily - Discontinue Lasix - Will require close followup for K monitoring (4.5 at discharge) -Discharge home today  Diabetes - A1c 9.4 - metformin 500mg   Hyperlipidemia - Age 43, male, smoker, diabetic, HTN, CHF: high intensity statin is indicated, goal LDL<70 (96 today) Simvastatin 40mg  daily  Hypertensive Urgency, resolved - Pt is just below borderline to meet criteria for urgency. He reports 2 weeks of HTN med noncompliance 2/2 financial considerations. On exam, he is fluid overloaded with crackles at lung bases. He has received 40mg  IV lasix and is currently diuresing well with total 0.9L urine so far. Plan to observe him for improvement of Sxs, and reassess the need for further diuresis.  -hydralazine 10mg   -lisinopril 10mg    Pulmonary edema, resolved - Maintain fluid status with spironolactone, as noted above.  Health Maintenance - Pt states he does not have a PCP he sees regularly for his DM, HTN, HLD, but has seen Dr. Amadeo Garnet at the [facility] on [address]. Attempts to contact this clinic were unsuccessful. Mr. Polek was encouraged to call at the next available business hour to set up an appointment, or walk-in to visit and discuss his chronic medical conditions. In the mean-time, it would benefit him to come to a one-time visit in our clinic.    This is a Careers information officer Note.  The care of the patient was discussed with Dr. Denton Brick and the assessment and plan formulated with their assistance.  Please see their attached note for official  documentation of the daily encounter.   LOS: 1 day   Barbie Haggis, Med Student 09/23/2013, 9:43 AM

## 2013-10-01 ENCOUNTER — Encounter (HOSPITAL_COMMUNITY): Payer: Self-pay

## 2013-10-31 ENCOUNTER — Emergency Department (HOSPITAL_COMMUNITY): Payer: Self-pay

## 2013-10-31 ENCOUNTER — Emergency Department (HOSPITAL_COMMUNITY)
Admission: EM | Admit: 2013-10-31 | Discharge: 2013-10-31 | Disposition: A | Discharge status: Hospice | Payer: Self-pay | Source: Home / Self Care

## 2013-10-31 ENCOUNTER — Encounter (HOSPITAL_COMMUNITY): Payer: Self-pay | Admitting: Emergency Medicine

## 2013-10-31 ENCOUNTER — Inpatient Hospital Stay (HOSPITAL_COMMUNITY)
Admission: EM | Admit: 2013-10-31 | Discharge: 2013-11-04 | DRG: 728 | Disposition: A | Discharge status: Home / Self Care | Payer: Self-pay | Attending: Internal Medicine | Admitting: Internal Medicine

## 2013-10-31 DIAGNOSIS — N498 Inflammatory disorders of other specified male genital organs: Secondary | ICD-10-CM

## 2013-10-31 DIAGNOSIS — I509 Heart failure, unspecified: Secondary | ICD-10-CM | POA: Diagnosis present

## 2013-10-31 DIAGNOSIS — F17200 Nicotine dependence, unspecified, uncomplicated: Secondary | ICD-10-CM | POA: Diagnosis present

## 2013-10-31 DIAGNOSIS — N492 Inflammatory disorders of scrotum: Secondary | ICD-10-CM | POA: Diagnosis present

## 2013-10-31 DIAGNOSIS — IMO0001 Reserved for inherently not codable concepts without codable children: Secondary | ICD-10-CM | POA: Diagnosis present

## 2013-10-31 DIAGNOSIS — F1010 Alcohol abuse, uncomplicated: Secondary | ICD-10-CM | POA: Diagnosis present

## 2013-10-31 DIAGNOSIS — E1165 Type 2 diabetes mellitus with hyperglycemia: Secondary | ICD-10-CM

## 2013-10-31 DIAGNOSIS — Z91199 Patient's noncompliance with other medical treatment and regimen due to unspecified reason: Secondary | ICD-10-CM

## 2013-10-31 DIAGNOSIS — J4489 Other specified chronic obstructive pulmonary disease: Secondary | ICD-10-CM | POA: Diagnosis present

## 2013-10-31 DIAGNOSIS — Z720 Tobacco use: Secondary | ICD-10-CM | POA: Diagnosis present

## 2013-10-31 DIAGNOSIS — Z7982 Long term (current) use of aspirin: Secondary | ICD-10-CM

## 2013-10-31 DIAGNOSIS — E785 Hyperlipidemia, unspecified: Secondary | ICD-10-CM | POA: Diagnosis present

## 2013-10-31 DIAGNOSIS — IMO0002 Reserved for concepts with insufficient information to code with codable children: Secondary | ICD-10-CM | POA: Diagnosis present

## 2013-10-31 DIAGNOSIS — I5022 Chronic systolic (congestive) heart failure: Secondary | ICD-10-CM | POA: Diagnosis present

## 2013-10-31 DIAGNOSIS — I10 Essential (primary) hypertension: Secondary | ICD-10-CM | POA: Diagnosis present

## 2013-10-31 DIAGNOSIS — I429 Cardiomyopathy, unspecified: Secondary | ICD-10-CM

## 2013-10-31 DIAGNOSIS — I428 Other cardiomyopathies: Secondary | ICD-10-CM | POA: Diagnosis present

## 2013-10-31 DIAGNOSIS — Z9119 Patient's noncompliance with other medical treatment and regimen: Secondary | ICD-10-CM

## 2013-10-31 DIAGNOSIS — J449 Chronic obstructive pulmonary disease, unspecified: Secondary | ICD-10-CM | POA: Diagnosis present

## 2013-10-31 LAB — URINALYSIS, ROUTINE W REFLEX MICROSCOPIC
GLUCOSE, UA: NEGATIVE mg/dL
Hgb urine dipstick: NEGATIVE
Ketones, ur: NEGATIVE mg/dL
Nitrite: NEGATIVE
PH: 5 (ref 5.0–8.0)
Protein, ur: >300 mg/dL — AB
SPECIFIC GRAVITY, URINE: 1.024 (ref 1.005–1.030)
Urobilinogen, UA: 1 mg/dL (ref 0.0–1.0)

## 2013-10-31 LAB — URINE MICROSCOPIC-ADD ON

## 2013-10-31 LAB — CBC WITH DIFFERENTIAL/PLATELET
Basophils Absolute: 0 10*3/uL (ref 0.0–0.1)
Basophils Relative: 0 % (ref 0–1)
EOS PCT: 3 % (ref 0–5)
Eosinophils Absolute: 0.5 10*3/uL (ref 0.0–0.7)
HCT: 37.7 % — ABNORMAL LOW (ref 39.0–52.0)
Hemoglobin: 12.9 g/dL — ABNORMAL LOW (ref 13.0–17.0)
LYMPHS ABS: 1.6 10*3/uL (ref 0.7–4.0)
LYMPHS PCT: 10 % — AB (ref 12–46)
MCH: 32.1 pg (ref 26.0–34.0)
MCHC: 34.2 g/dL (ref 30.0–36.0)
MCV: 93.8 fL (ref 78.0–100.0)
Monocytes Absolute: 0.9 10*3/uL (ref 0.1–1.0)
Monocytes Relative: 6 % (ref 3–12)
Neutro Abs: 12.7 10*3/uL — ABNORMAL HIGH (ref 1.7–7.7)
Neutrophils Relative %: 81 % — ABNORMAL HIGH (ref 43–77)
Platelets: 445 10*3/uL — ABNORMAL HIGH (ref 150–400)
RBC: 4.02 MIL/uL — AB (ref 4.22–5.81)
RDW: 12.8 % (ref 11.5–15.5)
WBC: 15.7 10*3/uL — AB (ref 4.0–10.5)

## 2013-10-31 LAB — GLUCOSE, CAPILLARY
GLUCOSE-CAPILLARY: 302 mg/dL — AB (ref 70–99)
Glucose-Capillary: 114 mg/dL — ABNORMAL HIGH (ref 70–99)

## 2013-10-31 LAB — I-STAT CHEM 8, ED
BUN: 19 mg/dL (ref 6–23)
CREATININE: 0.9 mg/dL (ref 0.50–1.35)
Calcium, Ion: 1.17 mmol/L (ref 1.12–1.23)
Chloride: 102 mEq/L (ref 96–112)
Glucose, Bld: 136 mg/dL — ABNORMAL HIGH (ref 70–99)
HCT: 40 % (ref 39.0–52.0)
Hemoglobin: 13.6 g/dL (ref 13.0–17.0)
Potassium: 4.7 mEq/L (ref 3.7–5.3)
SODIUM: 135 meq/L — AB (ref 137–147)
TCO2: 23 mmol/L (ref 0–100)

## 2013-10-31 LAB — I-STAT CG4 LACTIC ACID, ED: Lactic Acid, Venous: 0.89 mmol/L (ref 0.5–2.2)

## 2013-10-31 MED ORDER — PIPERACILLIN-TAZOBACTAM 3.375 G IVPB
3.3750 g | Freq: Three times a day (TID) | INTRAVENOUS | Status: DC
  Administered 2013-10-31 – 2013-11-03 (×8): 3.375 g via INTRAVENOUS
  Filled 2013-10-31 (×10): qty 50

## 2013-10-31 MED ORDER — INSULIN ASPART 100 UNIT/ML ~~LOC~~ SOLN
0.0000 [IU] | Freq: Every day | SUBCUTANEOUS | Status: DC
  Administered 2013-10-31: 4 [IU] via SUBCUTANEOUS
  Administered 2013-11-01: 2 [IU] via SUBCUTANEOUS

## 2013-10-31 MED ORDER — HYDROCODONE-ACETAMINOPHEN 5-325 MG PO TABS
1.0000 | ORAL_TABLET | ORAL | Status: DC | PRN
  Administered 2013-10-31 – 2013-11-03 (×6): 2 via ORAL
  Filled 2013-10-31 (×7): qty 2

## 2013-10-31 MED ORDER — ONDANSETRON HCL 4 MG/2ML IJ SOLN: 4.0000 mg | Freq: Four times a day (QID) | INTRAMUSCULAR | Status: DC | PRN

## 2013-10-31 MED ORDER — SIMVASTATIN 40 MG PO TABS
40.0000 mg | ORAL_TABLET | Freq: Every day | ORAL | Status: DC
  Administered 2013-11-01 – 2013-11-03 (×3): 40 mg via ORAL
  Filled 2013-10-31 (×4): qty 1

## 2013-10-31 MED ORDER — LOPERAMIDE HCL 2 MG PO CAPS: 2.0000 mg | ORAL_CAPSULE | ORAL | Status: AC | PRN

## 2013-10-31 MED ORDER — CLINDAMYCIN PHOSPHATE 600 MG/50ML IV SOLN
600.0000 mg | Freq: Once | INTRAVENOUS | Status: AC
  Administered 2013-10-31: 600 mg via INTRAVENOUS
  Filled 2013-10-31: qty 50

## 2013-10-31 MED ORDER — MORPHINE SULFATE 2 MG/ML IJ SOLN
2.0000 mg | INTRAMUSCULAR | Status: DC | PRN
  Administered 2013-10-31 – 2013-11-04 (×6): 2 mg via INTRAVENOUS
  Filled 2013-10-31 (×6): qty 1

## 2013-10-31 MED ORDER — LORAZEPAM 1 MG PO TABS: 1.0000 mg | ORAL_TABLET | Freq: Four times a day (QID) | ORAL | Status: AC | PRN

## 2013-10-31 MED ORDER — ENOXAPARIN SODIUM 40 MG/0.4ML ~~LOC~~ SOLN
40.0000 mg | SUBCUTANEOUS | Status: DC
  Administered 2013-10-31 – 2013-11-03 (×4): 40 mg via SUBCUTANEOUS
  Filled 2013-10-31 (×5): qty 0.4

## 2013-10-31 MED ORDER — VITAMIN B-1 100 MG PO TABS
100.0000 mg | ORAL_TABLET | Freq: Every day | ORAL | Status: DC
  Administered 2013-11-01 – 2013-11-04 (×4): 100 mg via ORAL
  Filled 2013-10-31 (×4): qty 1

## 2013-10-31 MED ORDER — ACETAMINOPHEN 325 MG PO TABS: 650.0000 mg | ORAL_TABLET | Freq: Four times a day (QID) | ORAL | Status: DC | PRN

## 2013-10-31 MED ORDER — SPIRONOLACTONE 25 MG PO TABS
25.0000 mg | ORAL_TABLET | Freq: Every day | ORAL | Status: DC
  Administered 2013-10-31 – 2013-11-04 (×5): 25 mg via ORAL
  Filled 2013-10-31 (×6): qty 1

## 2013-10-31 MED ORDER — ACETAMINOPHEN 650 MG RE SUPP: 650.0000 mg | Freq: Four times a day (QID) | RECTAL | Status: DC | PRN

## 2013-10-31 MED ORDER — VANCOMYCIN HCL IN DEXTROSE 1-5 GM/200ML-% IV SOLN
1000.0000 mg | Freq: Three times a day (TID) | INTRAVENOUS | Status: DC
  Administered 2013-10-31 – 2013-11-02 (×6): 1000 mg via INTRAVENOUS
  Filled 2013-10-31 (×8): qty 200

## 2013-10-31 MED ORDER — SODIUM CHLORIDE 0.9 % IJ SOLN
3.0000 mL | Freq: Two times a day (BID) | INTRAMUSCULAR | Status: DC
  Administered 2013-11-02 – 2013-11-04 (×3): 3 mL via INTRAVENOUS

## 2013-10-31 MED ORDER — IOHEXOL 300 MG/ML  SOLN
100.0000 mL | Freq: Once | INTRAMUSCULAR | Status: AC | PRN
  Administered 2013-10-31: 100 mL via INTRAVENOUS

## 2013-10-31 MED ORDER — ASPIRIN EC 81 MG PO TBEC
81.0000 mg | DELAYED_RELEASE_TABLET | Freq: Every day | ORAL | Status: DC
  Administered 2013-11-01 – 2013-11-04 (×4): 81 mg via ORAL
  Filled 2013-10-31 (×4): qty 1

## 2013-10-31 MED ORDER — INSULIN ASPART 100 UNIT/ML ~~LOC~~ SOLN
0.0000 [IU] | Freq: Three times a day (TID) | SUBCUTANEOUS | Status: DC
  Administered 2013-11-01: 5 [IU] via SUBCUTANEOUS
  Administered 2013-11-01: 2 [IU] via SUBCUTANEOUS
  Administered 2013-11-01: 3 [IU] via SUBCUTANEOUS
  Administered 2013-11-02 – 2013-11-03 (×3): 2 [IU] via SUBCUTANEOUS
  Administered 2013-11-04: 3 [IU] via SUBCUTANEOUS
  Administered 2013-11-04: 2 [IU] via SUBCUTANEOUS

## 2013-10-31 MED ORDER — MORPHINE SULFATE 4 MG/ML IJ SOLN: 4.0000 mg | Freq: Once | INTRAMUSCULAR | Status: DC

## 2013-10-31 MED ORDER — HYDROXYZINE HCL 25 MG PO TABS: 25.0000 mg | ORAL_TABLET | Freq: Four times a day (QID) | ORAL | Status: AC | PRN

## 2013-10-31 MED ORDER — ALBUTEROL SULFATE (2.5 MG/3ML) 0.083% IN NEBU: 2.5000 mg | INHALATION_SOLUTION | RESPIRATORY_TRACT | Status: DC | PRN

## 2013-10-31 MED ORDER — ADULT MULTIVITAMIN W/MINERALS CH
1.0000 | ORAL_TABLET | Freq: Every day | ORAL | Status: DC
  Administered 2013-10-31 – 2013-11-04 (×5): 1 via ORAL
  Filled 2013-10-31 (×5): qty 1

## 2013-10-31 MED ORDER — ONDANSETRON HCL 4 MG PO TABS: 4.0000 mg | ORAL_TABLET | Freq: Four times a day (QID) | ORAL | Status: DC | PRN

## 2013-10-31 MED ORDER — ONDANSETRON 4 MG PO TBDP
4.0000 mg | ORAL_TABLET | Freq: Four times a day (QID) | ORAL | Status: AC | PRN
  Filled 2013-10-31: qty 1

## 2013-10-31 MED ORDER — CARVEDILOL 12.5 MG PO TABS
12.5000 mg | ORAL_TABLET | Freq: Two times a day (BID) | ORAL | Status: DC
  Administered 2013-10-31 – 2013-11-04 (×8): 12.5 mg via ORAL
  Filled 2013-10-31 (×10): qty 1

## 2013-10-31 MED ORDER — LISINOPRIL 10 MG PO TABS
10.0000 mg | ORAL_TABLET | Freq: Every day | ORAL | Status: DC
  Administered 2013-11-01 – 2013-11-04 (×4): 10 mg via ORAL
  Filled 2013-10-31 (×4): qty 1

## 2013-10-31 MED ORDER — THIAMINE HCL 100 MG/ML IJ SOLN
100.0000 mg | Freq: Once | INTRAMUSCULAR | Status: AC
  Administered 2013-10-31: 100 mg via INTRAMUSCULAR
  Filled 2013-10-31: qty 1

## 2013-10-31 NOTE — ED Notes (Signed)
Md Borden at bedside performing I & D of left scrotum. Pt tolerated well. Pressure dressing applied.

## 2013-10-31 NOTE — ED Notes (Signed)
Admitting MD at bedside.

## 2013-10-31 NOTE — ED Provider Notes (Signed)
Medical screening examination/treatment/procedure(s) were conducted as a shared visit with non-physician practitioner(s) and myself.  I personally evaluated the patient during the encounter.   EKG Interpretation None      Pt is a 43 y.o. M with history of diabetes who presents to the emergency department with scrotal swelling for the last day. He denies any fevers or chills. He has had swelling that has been present for 1 day. Denies a history of hidradenitis. No history of prior abscess. No vomiting or diarrhea. No testicular pain, dysuria or hematuria. On exam, patient has significant swelling and induration of the scrotum without erythema. There is no perineal crepitus or perineal warmth or erythema. He does have multiple areas with some purulent drainage that appear similar to hidradenitis. We'll obtain CT scan to evaluate for possible abscess and less likely Fournier's gangrene. We'll obtain labs, give IV clindamycin.   CT scan shows a scrotal abscess with significant cellulitis. Spoke with Dr. Alinda Money with urology who will see the patient in consult. He recommends admission given patient's cellulitis and history of diabetes. Discussed with hospitalist Dr. Algis Liming was requested admission for inpatient, telemetry bed given patient has a history of an ejection fraction of 30%.  [place], DO 10/31/13 1700

## 2013-10-31 NOTE — ED Notes (Signed)
Per pt sent here from [facility] for further evaluation of scrotal abscess.

## 2013-10-31 NOTE — Progress Notes (Signed)
ANTIBIOTIC CONSULT NOTE - INITIAL  Pharmacy Consult for Vancomycin and Zosyn Indication: scrotal abscess  No Known Allergies  Patient Measurements: Height: 6' (182.9 cm) Weight: 218 lb (98.884 kg) IBW/kg (Calculated) : 77.6  Vital Signs: Temp: 99.2 F (37.3 C) (07/22 1813) Temp src: Oral (07/22 1059) BP: 163/111 mmHg (07/22 1813) Pulse Rate: 111 (07/22 1813) Intake/Output from previous day:   Intake/Output from this shift:    Labs:  Recent Labs  10/31/13 1126 10/31/13 1136  WBC 15.7*  --   HGB 12.9* 13.6  PLT 445*  --   CREATININE  --  0.90   Estimated Creatinine Clearance: 126.2 ml/min (by C-G formula based on Cr of 0.9). No results found for this basename: VANCOTROUGH, VANCOPEAK, VANCORANDOM, GENTTROUGH, GENTPEAK, GENTRANDOM, TOBRATROUGH, TOBRAPEAK, TOBRARND, AMIKACINPEAK, AMIKACINTROU, AMIKACIN,  in the last 72 hours   Microbiology: No results found for this or any previous visit (from the past 720 hour(s)).  Medical History: Past Medical History  Diagnosis Date  . Bronchitis   . Croup   . Tobacco abuse   . Dizziness   . Abscess of perineum   . Heartburn   . Indigestion   . H/O folliculitis   . 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 Alcohol abuse   . Hypertension     variable in nature  . Hyperlipidemia   . Multiple excoriations     multifactorial in origin  . Mild obesity   . Dietary noncompliance     History of,  . Situational stress     secondary to financial issues  . Erectile dysfunction     Multifactorial. Improved with Cialis. New onset.  . Cystic acne     with acne keloidosis  . Diabetes mellitus without complication     Type 2, uncontrolled with questionable improvement  . 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 EKG, abnormal     History of, with negative cardiac evaluation by history.  [name] COPD (chronic obstructive pulmonary disease)     moderate obstruction with low vital capacity  . Asthma     History of,    Medications:  Prescriptions prior to admission  Medication Sig Dispense Refill  . aspirin EC 81 MG EC tablet Take 1 tablet (81 mg total) by mouth daily.  30 tablet  6  . carvedilol (COREG) 12.5 MG tablet Take 1 tablet (12.5 mg total) by mouth 2 (two) times daily with a meal.  30 tablet  0  . lisinopril (PRINIVIL,ZESTRIL) 10 MG tablet Take 1 tablet (10 mg total) by mouth daily.  30 tablet  0  . metFORMIN (GLUCOPHAGE) 500 MG tablet Take 1 tablet (500 mg total) by mouth 2 (two) times daily with a meal.  60 tablet  0  . Multiple Vitamins-Minerals (CENTRUM ADULTS PO) Take 1 tablet by mouth daily.      . simvastatin (ZOCOR) 40 MG tablet Take 1 tablet (40 mg total) by mouth daily.  30 tablet  0  . spironolactone (ALDACTONE) 25 MG tablet Take 1 tablet (25 mg total) by mouth daily.  30 tablet  0   Assessment: 43 y.o. male presents with scrotal pain, swelling, and discharge. S/p I&D of scrotal abscess in ED. To begin broad spectrum antibiotics (Vanc and Zosyn) for scrotal abscess. Estimated CrCl >100 ml/min. WBC elevated.  Goal of Therapy:  Vancomycin trough level 10-15 mcg/ml  Plan:  1. Zosyn 3.375gm IV q8h. Each dose over 4 hours  2. Vancomycin 1gm IV q8h. 3. Will f/u renal function, micro data, pt's clinical condition 4. Vanc trough prn  Sherlon Handing, PharmD, BCPS Clinical pharmacist, pager [phone] 10/31/2013,6:35 PM

## 2013-10-31 NOTE — H&P (Signed)
History and Physical  Austin Alexander [id number] DOB: [birth year] DOA: 10/31/2013   Referring physician: Dr. Pryor Curia, [place] PCP: No PCP Per Patient  Outpatient Specialists:  1. Cardiology: Dr. Charolette Forward.  Chief Complaint: Scrotal pain, swelling and drainage.  HPI: Austin Alexander is a 43 y.o. male with history of type II DM, HTN, cardiomyopathy with LVEF of [id number], chronic systolic CHF, ongoing tobacco and alcohol abuse, hyperlipidemia, prior history of skin infections-facial acne, abscesses, folliculitis, presented to the ED from urgent care center for complaints as above. Patient states that he first noticed a bump underneath his scrotum 2 days ago. He denies taking on the bump. Since then he has noticed rapidly progressive scrotal swelling, worsening pain rated as 8/10 which is worse on sitting and drainage from the scrotal area. He tried topical treatment with warm water soaks and OTC medications without significant relief. He denies fever or chills. He claims compliance to all his medications except lisinopril. He denies chest pain, dyspnea, palpitations, orthopnea or PND. In the ED, he was noted to have findings suggestive of scrotal abscesses which were confirmed on CT, WBC 15.7. Urology has been consulted by EDP and hospitalist admission requested.   Review of Systems: All systems reviewed and apart from history of presenting illness, are negative.  Past Medical History  Diagnosis Date  . Bronchitis   . Croup   . Tobacco abuse   . Dizziness   . Abscess of perineum   . Heartburn   . Indigestion   . H/O folliculitis   . 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 Alcohol abuse   . Hypertension     variable in nature  . Hyperlipidemia   . Multiple excoriations     multifactorial in origin  . Mild obesity   . Dietary noncompliance     History of,  . Situational stress     secondary to financial issues  . Erectile dysfunction     Multifactorial. Improved with  Cialis. New onset.  . Cystic acne     with acne keloidosis  . Diabetes mellitus without complication     Type 2, uncontrolled with questionable improvement  . 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 EKG, abnormal     History of, with negative cardiac evaluation by history.  [name] COPD (chronic obstructive pulmonary disease)     moderate obstruction with low vital capacity  . Asthma     History of,   History reviewed. No pertinent past surgical history. Social History:  reports that he has been smoking.  He does not have any smokeless tobacco history on file. He reports that he drinks alcohol. His drug history is not on file. Single. Independent of activities of daily living. Smokes about a pack of cigarettes per day. States that he drinks 4 times per week including beers and liquor but evasive about exact amount. Seems to be drinking more than he states.   No Known Allergies  History reviewed. No pertinent family history. denies family history.   Prior to Admission medications   Medication Sig Start Date End Date Taking? Authorizing Provider  aspirin EC 81 MG EC tablet Take 1 tablet (81 mg total) by mouth daily. 09/23/13  Yes Ejiroghene Emokpae, MD  carvedilol (COREG) 12.5 MG tablet Take 1 tablet (12.5 mg total) by mouth 2 (two) times daily with a meal. 09/23/13  Yes Ejiroghene Emokpae, MD  lisinopril (PRINIVIL,ZESTRIL) 10 MG tablet  Take 1 tablet (10 mg total) by mouth daily. 09/23/13  Yes Ejiroghene Emokpae, MD  metFORMIN (GLUCOPHAGE) 500 MG tablet Take 1 tablet (500 mg total) by mouth 2 (two) times daily with a meal. 09/23/13  Yes Ejiroghene Emokpae, MD  Multiple Vitamins-Minerals (CENTRUM ADULTS PO) Take 1 tablet by mouth daily.   Yes Historical Provider, MD  simvastatin (ZOCOR) 40 MG tablet Take 1 tablet (40 mg total) by mouth daily. 09/23/13  Yes Ejiroghene Emokpae, MD  spironolactone (ALDACTONE) 25 MG tablet Take 1 tablet (25 mg total) by mouth daily. 09/23/13  Yes  Jenetta Downer, MD   Physical Exam: Filed Vitals:   10/31/13 1515 10/31/13 1525 10/31/13 1545 10/31/13 1645  BP: 141/96 141/96 144/97 144/98  Pulse: 88 92 90 92  Temp:      TempSrc:      Resp:      Height:      Weight:      SpO2: 96% 98% 98% 98%   respiratory rate: 18 per minute, temperature: 99.4 degrees Fahrenheit   General exam: Moderately built and nourished male patient, lying comfortably supine on the gurney in no obvious distress.  Head, eyes and ENT: Nontraumatic and normocephalic. Pupils equally reacting to light and accommodation. Oral mucosa moist.   Neck: Supple. No JVD, carotid bruit or thyromegaly.  Lymphatics: No lymphadenopathy.  Respiratory system: Clear to auscultation. No increased work of breathing.  Cardiovascular system: S1 and S2 heard, RRR. No JVD, murmurs, gallops, clicks or pedal edema.  Gastrointestinal system: Abdomen is nondistended, soft and nontender. Normal bowel sounds heard. No organomegaly or masses appreciated. Scrotum is moderately enlarged with induration of skin, couple of areas of ulceration with purulent drainage, mild tenderness without significant erythema. No crepitus. Penis appears normal.  Central nervous system: Alert and oriented. No focal neurological deficits.  Extremities: Symmetric 5 x 5 power. Peripheral pulses symmetrically felt.   Skin: No rashes or acute findings.  Musculoskeletal system: Negative exam.  Psychiatry: Pleasant and cooperative.   Labs on Admission:  Basic Metabolic Panel:  Recent Labs Lab 10/31/13 1136  NA 135*  K 4.7  CL 102  GLUCOSE 136*  BUN 19  CREATININE 0.90   Liver Function Tests: No results found for this basename: AST, ALT, ALKPHOS, BILITOT, PROT, ALBUMIN,  in the last 168 hours No results found for this basename: LIPASE, AMYLASE,  in the last 168 hours No results found for this basename: AMMONIA,  in the last 168 hours CBC:  Recent Labs Lab 10/31/13 1126 10/31/13 1136    WBC 15.7*  --   NEUTROABS 12.7*  --   HGB 12.9* 13.6  HCT 37.7* 40.0  MCV 93.8  --   PLT 445*  --    Cardiac Enzymes: No results found for this basename: CKTOTAL, CKMB, CKMBINDEX, TROPONINI,  in the last 168 hours  BNP (last 3 results)  Recent Labs  09/22/13 0344  PROBNP 1845.0*   CBG: No results found for this basename: GLUCAP,  in the last 168 hours  Radiological Exams on Admission: Ct Pelvis W Contrast  10/31/2013   CLINICAL DATA:  Scrotal pain and swelling for 2 days  EXAM: CT PELVIS WITH CONTRAST  TECHNIQUE: Multidetector CT imaging of the pelvis was performed using the standard protocol following the bolus administration of intravenous contrast.  CONTRAST:  18mL OMNIPAQUE IOHEXOL 300 MG/ML  SOLN  COMPARISON:  None.  FINDINGS: There is diffuse edema of the scrotum. A midline scrotal hypodensity surrounded by increased density measures 4  x 2 cm and may reflect a scrotal abscess. There is no gas within the soft tissues of the scrotum or extending into the perineal region. The inflammatory changes do not appear to extend superiorly along the inguinal canals into the pelvis. There are enlarged inguinal lymph nodes bilaterally.  Separate from the scrotum along the posterior aspect of the buttocks there is skin thickening and increased fat density on the left. Within the pelvic cavity no abnormal fluid collections or inflammatory changes are demonstrated. The bony structures are unremarkable.  IMPRESSION: 1. The findings are consistent with a scrotal abscess and diffuse scrotal wall edema. No gas collections are demonstrated. There are enlarged inguinal lymph nodes bilaterally. 2. There is change consistent with a cellulitis involving the gluteal regions posteriorly, separate from the scrotal base. 3. There is no extension of inflammatory changes into the pelvic cavity.   Electronically Signed   By: David  Martinique   On: 10/31/2013 12:43    Assessment/Plan Principal Problem:   Scrotal  abscess Active Problems:   HTN (hypertension)   Uncontrolled diabetes mellitus   Tobacco abuse   Systolic CHF, chronic   Cardiomyopathy   Alcohol abuse   1. Scrotal abscess: Admit to telemetry. Treat empirically with IV Zosyn, vancomycin and pain management. Urology has been consulted and we'll await this input regarding bedside incision and drainage versus drainage in order GA (in which case may consider cardiology clearance given low EF although seems compensated from CHF standpoint). 2. Uncontrolled type II DM: Hold oral hypoglycemics and place on SSI. 3. Uncontrolled hypertension: Continue carvedilol and resume lisinopril which he as been noncompliant with. Counseled regarding compliance with medications. 4. Chronic systolic CHF/cardiomyopathy: LVEF 25-30% in June 2015. He had then been admitted for acute on chronic systolic CHF and had declined cardiac cath. Continue medical management-Aldactone, lisinopril, statins, beta blockers. Compensated. 5. Tobacco abuse: Cessation counseled. Patient declines the cooking patch. 6. Alcohol abuse: Ativan protocol.     Code Status: Full  Family Communication: None at bedside.  Disposition Plan: Home when medically stable.   Time spent: 72 minutes  Ralynn San, MD, FACP, FHM. [facility] Pager [phone]  If 7PM-7AM, please contact night-coverage [email or web address] Password [facility] 10/31/2013, 5:12 PM

## 2013-10-31 NOTE — ED Provider Notes (Signed)
Austin Alexander is a 43 y.o. male who presents to Urgent Care today for scrotal swelling. Patient presents to urgent care today for scrotal abscess and swelling. Symptoms were present over the past 2 days and dramatically worsened yesterday. He notes significant tenderness and swelling. He denies any significant fevers or chills nausea vomiting or diarrhea.   Past Medical History  Diagnosis Date  . Bronchitis   . Croup   . Tobacco abuse   . Dizziness   . Abscess of perineum   . Heartburn   . Indigestion   . H/O folliculitis   . 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 Alcohol abuse   . Hypertension     variable in nature  . Hyperlipidemia   . Multiple excoriations     multifactorial in origin  . Mild obesity   . Dietary noncompliance     History of,  . Situational stress     secondary to financial issues  . Erectile dysfunction     Multifactorial. Improved with Cialis. New onset.  . Cystic acne     with acne keloidosis  . Diabetes mellitus without complication     Type 2, uncontrolled with questionable improvement  . 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 EKG, abnormal     History of, with negative cardiac evaluation by history.  [name] COPD (chronic obstructive pulmonary disease)     moderate obstruction with low vital capacity  . Asthma     History of,   History  Substance Use Topics  . Smoking status: Current Every Day Smoker  . Smokeless tobacco: Not on file  . Alcohol Use: Yes   ROS as above Medications: No current facility-administered medications for this encounter.   Current Outpatient Prescriptions  Medication Sig Dispense Refill  . aspirin EC 81 MG EC tablet Take 1 tablet (81 mg total) by mouth daily.  30 tablet  6  . carvedilol (COREG) 12.5 MG tablet Take 1 tablet (12.5 mg total) by mouth 2 (two) times daily with a meal.  30 tablet  0  . lisinopril (PRINIVIL,ZESTRIL) 10 MG tablet Take 1 tablet (10 mg total) by mouth daily.   30 tablet  0  . metFORMIN (GLUCOPHAGE) 500 MG tablet Take 1 tablet (500 mg total) by mouth 2 (two) times daily with a meal.  60 tablet  0  . Multiple Vitamins-Minerals (CENTRUM ADULTS PO) Take 1 tablet by mouth daily.      . simvastatin (ZOCOR) 40 MG tablet Take 1 tablet (40 mg total) by mouth daily.  30 tablet  0  . spironolactone (ALDACTONE) 25 MG tablet Take 1 tablet (25 mg total) by mouth daily.  30 tablet  0    Exam:  BP 150/103  Pulse 99  Temp(Src) 99.4 F (37.4 C)  Resp 16  SpO2 99% Gen: Well NAD HEENT: EOMI,  MMM Lungs: Normal work of breathing. CTABL Heart: RRR no MRG Abd: NABS, Soft. Nondistended, Nontender Exts: Brisk capillary refill, warm and well perfused.  Scrotum: Significant induration and swelling in the inferior scrotum into the perineum. Patient has multiple areas of pus oozing with pressure on the right side of his scrotum. The testicles are nontender bilaterally. No SI induration or swelling. No abdominal induration  No results found for this or any previous visit (from the past 24 hour(s)). No results found.  Assessment and Plan: 43 y.o. male with large scrotal abscess with induration. Concern for early  Fournier's gangrene. Transfer to the emergency room for evaluation and management. Of note patient has poor health generally with recent admission for acute on chronic heart failure and poorly controlled diabetes. These medical problems increase his risk.  Discussed warning signs or symptoms. Please see discharge instructions. Patient expresses understanding.   This note was created using Systems analyst. Any transcription errors are unintended.    Gregor Hams, MD 10/31/13 [phone]

## 2013-10-31 NOTE — ED Notes (Signed)
C/o abscess on groin area which was notice on Monday Did soak in tub  Does have little drainage

## 2013-10-31 NOTE — Progress Notes (Signed)
Brandonjames Tango [id number] Admitted to 5W: 10/31/2013 6:42 PM Attending Provider: Modena Jansky, MD    Austin Alexander is a 43 y.o. male patient admitted from ED awake, alert  & orientated  X 3,  Full Code, VSS - Blood pressure 163/111, pulse 111, temperature 99.2 F (37.3 C), temperature source Oral, resp. rate 18, height 6' (1.829 m), weight 98.884 kg (218 lb), SpO2 99.00%.,  R/A, no c/o shortness of breath, no c/o chest pain, no distress noted. Tele # 08 placed and pt is currently running:sinus tachycardia.   IV site WDL:  forearm left, condition patent and no redness with a transparent dsg that's clean dry and intact.  Allergies:  No Known Allergies   Past Medical History  Diagnosis Date  . Bronchitis   . Croup   . Tobacco abuse   . Dizziness   . Abscess of perineum   . Heartburn   . Indigestion   . H/O folliculitis   . 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 Alcohol abuse   . Hypertension     variable in nature  . Hyperlipidemia   . Multiple excoriations     multifactorial in origin  . Mild obesity   . Dietary noncompliance     History of,  . Situational stress     secondary to financial issues  . Erectile dysfunction     Multifactorial. Improved with Cialis. New onset.  . Cystic acne     with acne keloidosis  . Diabetes mellitus without complication     Type 2, uncontrolled with questionable improvement  . 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 EKG, abnormal     History of, with negative cardiac evaluation by history.  [name] COPD (chronic obstructive pulmonary disease)     moderate obstruction with low vital capacity  . Asthma     History of,    History:  obtained from patient.  Pt orientation to unit, room and routine. Information packet given to patient/family and safety video watched.  Admission INP armband ID verified with patient/family, and in place. SR up x 2, fall risk assessment complete with Patient and family  verbalizing understanding of risks associated with falls. Pt verbalizes an understanding of how to use the call bell and to call for help before getting out of bed.  Skin, clean-dry- intact without evidence of bruising, or skin tears.   S/P I & D to scrotum abscess.    Will cont to monitor and assist as needed.  Lindalou Hose, RN 10/31/2013 6:42 PM

## 2013-10-31 NOTE — Progress Notes (Signed)
Received report from [facility] in ED.

## 2013-10-31 NOTE — ED Provider Notes (Signed)
CSN: [id number]     Arrival date & time 10/31/13  1052 History   First MD Initiated Contact with Patient 10/31/13 1103     Chief Complaint  Patient presents with  . Abscess     (Consider location/radiation/quality/duration/timing/severity/associated sxs/prior Treatment) HPI  43 year old male with history of noncompliant diabetes, CHF, tobacco and alcohol abuse, history of folliculitis who was sent here from urgent care for evaluation of scrotal abscess concerning for early Fournier's gangrene.  Patient reported rapid onset of pain, and swelling to his scrotal region for the past 2 days. Patient states she noticed a bump to his scrotum 2 days ago. He has been trying to soak in warm water with absence of that today he notice that the swelling has.progressively worse. He also noticed some pus discharge from scrotum.  Complaining of pain only with palpation. He denies fever, chills, headache, chest pain, shortness of breath, abdominal pain, back pain, dysuria, hematuria, rectal pain, rectal bleeding. He has had a similar abscess many years ago.  Past Medical History  Diagnosis Date  . Bronchitis   . Croup   . Tobacco abuse   . Dizziness   . Abscess of perineum   . Heartburn   . Indigestion   . H/O folliculitis   . 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 Alcohol abuse   . Hypertension     variable in nature  . Hyperlipidemia   . Multiple excoriations     multifactorial in origin  . Mild obesity   . Dietary noncompliance     History of,  . Situational stress     secondary to financial issues  . Erectile dysfunction     Multifactorial. Improved with Cialis. New onset.  . Cystic acne     with acne keloidosis  . Diabetes mellitus without complication     Type 2, uncontrolled with questionable improvement  . 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 EKG, abnormal     History of, with negative cardiac evaluation by history.  [name] COPD (chronic  obstructive pulmonary disease)     moderate obstruction with low vital capacity  . Asthma     History of,   History reviewed. No pertinent past surgical history. History reviewed. No pertinent family history. History  Substance Use Topics  . Smoking status: Current Every Day Smoker  . Smokeless tobacco: Not on file  . Alcohol Use: Yes    Review of Systems  Constitutional: Negative for fever.  Respiratory: Negative for shortness of breath.   Cardiovascular: Negative for chest pain.  Genitourinary: Positive for scrotal swelling. Negative for dysuria, flank pain, difficulty urinating, penile pain and testicular pain.  Neurological: Negative for numbness and headaches.  All other systems reviewed and are negative.     Allergies  Review of patient's allergies indicates no known allergies.  Home Medications   Prior to Admission medications   Medication Sig Start Date End Date Taking? Authorizing Provider  aspirin EC 81 MG EC tablet Take 1 tablet (81 mg total) by mouth daily. 09/23/13   Jenetta Downer, MD  carvedilol (COREG) 12.5 MG tablet Take 1 tablet (12.5 mg total) by mouth 2 (two) times daily with a meal. 09/23/13   Ejiroghene Emokpae, MD  lisinopril (PRINIVIL,ZESTRIL) 10 MG tablet Take 1 tablet (10 mg total) by mouth daily. 09/23/13   Jenetta Downer, MD  metFORMIN (GLUCOPHAGE) 500 MG tablet Take 1 tablet (500 mg total) by mouth 2 (  two) times daily with a meal. 09/23/13   Jenetta Downer, MD  Multiple Vitamins-Minerals (CENTRUM ADULTS PO) Take 1 tablet by mouth daily.    Historical Provider, MD  simvastatin (ZOCOR) 40 MG tablet Take 1 tablet (40 mg total) by mouth daily. 09/23/13   Jenetta Downer, MD  spironolactone (ALDACTONE) 25 MG tablet Take 1 tablet (25 mg total) by mouth daily. 09/23/13   Ejiroghene Emokpae, MD   BP 137/95  Pulse 95  Temp(Src) 99.1 F (37.3 C) (Oral)  Resp 20  Ht 6' (1.829 m)  Wt 223 lb (101.152 kg)  BMI 30.24 kg/m2  SpO2 99% Physical  Exam  Constitutional: He appears well-developed and well-nourished. No distress.  HENT:  Head: Atraumatic.  Eyes: Conjunctivae are normal.  Neck: Normal range of motion. Neck supple.  Cardiovascular: Normal rate and regular rhythm.   Pulmonary/Chest: Effort normal and breath sounds normal.  Abdominal: Soft. There is no tenderness.  Genitourinary:  Scrotum with moderate induration, and swelling noted to the inferior scrotum approaching perineum. An area of pus noted to left side of scrotum, which can be expressed with pressure.  Mild tenderness on exam.  Testicles are nontender.  Normal penile shaft, no discharge.    Neurological: He is alert.  Skin: No rash noted.  Psychiatric: He has a normal mood and affect.    ED Course  Procedures (including critical care time)  11:32 AM Pt with rapid progressive scrotal swelling concerning for scrotal abscess, and early Fournier's Gangrene given hx of uncontrolled DM and alcohol abuse.  Care discussed with Dr. Leonides Schanz.  2:53 PM Pt has a scrotal abscess on CT scan.  No evidence of Fournier's Gangrene.  [facility] has been consulted but was recommended to consult urology for management.  Will consult urology.  Pt otherwise has a leukocytosis of 15.7 with a left shift.    3:40 PM Urology, Dr. Alinda Money was consulted who agrees to be available for consultation and will also see pt.  He request for medical admission.  Will consult for medicine admission.   4:19 PM Pt will be admitted for further care.  My attending has consulted for admission.    Labs Review Labs Reviewed  CBC WITH DIFFERENTIAL - Abnormal; Notable for the following:    WBC 15.7 (*)    RBC 4.02 (*)    Hemoglobin 12.9 (*)    HCT 37.7 (*)    Platelets 445 (*)    Neutrophils Relative % 81 (*)    Neutro Abs 12.7 (*)    Lymphocytes Relative 10 (*)    All other components within normal limits  URINALYSIS, ROUTINE W REFLEX MICROSCOPIC - Abnormal; Notable for the following:     Color, Urine AMBER (*)    APPearance HAZY (*)    Bilirubin Urine SMALL (*)    Protein, ur >300 (*)    Leukocytes, UA SMALL (*)    All other components within normal limits  URINE MICROSCOPIC-ADD ON - Abnormal; Notable for the following:    Bacteria, UA FEW (*)    Casts HYALINE CASTS (*)    All other components within normal limits  I-STAT CHEM 8, ED - Abnormal; Notable for the following:    Sodium 135 (*)    Glucose, Bld 136 (*)    All other components within normal limits  CULTURE, BLOOD (ROUTINE X 2)  CULTURE, BLOOD (ROUTINE X 2)  I-STAT CG4 LACTIC ACID, ED    Imaging Review Ct Pelvis W Contrast  10/31/2013  CLINICAL DATA:  Scrotal pain and swelling for 2 days  EXAM: CT PELVIS WITH CONTRAST  TECHNIQUE: Multidetector CT imaging of the pelvis was performed using the standard protocol following the bolus administration of intravenous contrast.  CONTRAST:  110mL OMNIPAQUE IOHEXOL 300 MG/ML  SOLN  COMPARISON:  None.  FINDINGS: There is diffuse edema of the scrotum. A midline scrotal hypodensity surrounded by increased density measures 4 x 2 cm and may reflect a scrotal abscess. There is no gas within the soft tissues of the scrotum or extending into the perineal region. The inflammatory changes do not appear to extend superiorly along the inguinal canals into the pelvis. There are enlarged inguinal lymph nodes bilaterally.  Separate from the scrotum along the posterior aspect of the buttocks there is skin thickening and increased fat density on the left. Within the pelvic cavity no abnormal fluid collections or inflammatory changes are demonstrated. The bony structures are unremarkable.  IMPRESSION: 1. The findings are consistent with a scrotal abscess and diffuse scrotal wall edema. No gas collections are demonstrated. There are enlarged inguinal lymph nodes bilaterally. 2. There is change consistent with a cellulitis involving the gluteal regions posteriorly, separate from the scrotal base.  3. There is no extension of inflammatory changes into the pelvic cavity.   Electronically Signed   By: David  Martinique   On: 10/31/2013 12:43     EKG Interpretation None      MDM   Final diagnoses:  Scrotal abscess    BP 144/97  Pulse 90  Temp(Src) 99.1 F (37.3 C) (Oral)  Resp 18  Ht 6' (1.829 m)  Wt 223 lb (101.152 kg)  BMI 30.24 kg/m2  SpO2 98%  I have reviewed nursing notes and vital signs. I personally reviewed the imaging tests through PACS system  I reviewed available ER/hospitalization records thought the EMR     Domenic Moras, [place] 10/31/13 1619

## 2013-10-31 NOTE — Consult Note (Signed)
Urology Consult   Physician requesting consult: Dr. Janeann Merl  Reason for consult: Scrotal abscess  History of Present Illness: Austin Alexander is a 43 y.o. male with a history of type II DM, HTN, cardiomyopathy with LVEF of [id number], chronic systolic CHF, ongoing tobacco and alcohol abuse, and  Hyperlipidemia. He presented to the ED today with a 2 day history of worsening, severe, scrotal pain.  He states he has noted some purulent drainage from the scrotum and he has had some low grade fever. He denies prior history of scrotal abscess or infection but has had skin infections elsewhere on his body repeatedly. A CT scan was performed which demonstrated a 4 x 2 cm area that was hypodense and located in the midline of the inferior scrotum possibly consistent with a developing abscess.   He denies a history of voiding or storage urinary symptoms, hematuria, UTIs, STDs, urolithiasis, GU malignancy/trauma/surgery.  Past Medical History  Diagnosis Date  .    .    . Tobacco abuse   .    [name] Abscess of perineum   . Heartburn   . Indigestion   . H/O folliculitis   . 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 Alcohol abuse   . Hypertension     variable in nature  . Hyperlipidemia   . Multiple excoriations     multifactorial in origin  . Mild obesity   . Dietary noncompliance     History of,  . Situational stress     secondary to financial issues  . Erectile dysfunction     Multifactorial. Improved with Cialis. New onset.  . Cystic acne     with acne keloidosis  . Diabetes mellitus without complication     Type 2, uncontrolled with questionable improvement  . 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 EKG, abnormal     History of, with negative cardiac evaluation by history.  [name] COPD (chronic obstructive pulmonary disease)     moderate obstruction with low vital capacity  . Asthma     History of,    History reviewed. No pertinent past surgical  history.  Medications:  Home meds:    Medication List    ASK your doctor about these medications       aspirin 81 MG EC tablet  Take 1 tablet (81 mg total) by mouth daily.     carvedilol 12.5 MG tablet  Commonly known as:  COREG  Take 1 tablet (12.5 mg total) by mouth 2 (two) times daily with a meal.     CENTRUM ADULTS PO  Take 1 tablet by mouth daily.     lisinopril 10 MG tablet  Commonly known as:  PRINIVIL,ZESTRIL  Take 1 tablet (10 mg total) by mouth daily.     metFORMIN 500 MG tablet  Commonly known as:  GLUCOPHAGE  Take 1 tablet (500 mg total) by mouth 2 (two) times daily with a meal.     simvastatin 40 MG tablet  Commonly known as:  ZOCOR  Take 1 tablet (40 mg total) by mouth daily.     spironolactone 25 MG tablet  Commonly known as:  ALDACTONE  Take 1 tablet (25 mg total) by mouth daily.        Scheduled Meds: . [START ON 11/01/2013] aspirin EC  81 mg Oral Daily  . carvedilol  12.5 mg Oral BID WC  . enoxaparin (LOVENOX) injection  40 mg Subcutaneous Q24H  .  insulin aspart  0-5 Units Subcutaneous QHS  . [START ON 11/01/2013] insulin aspart  0-9 Units Subcutaneous TID WC  . [START ON 11/01/2013] lisinopril  10 mg Oral Daily  . multivitamin with minerals  1 tablet Oral Daily  . piperacillin-tazobactam (ZOSYN)  IV  3.375 g Intravenous 3 times per day  . [START ON 11/01/2013] simvastatin  40 mg Oral q1800  . sodium chloride  3 mL Intravenous Q12H  . spironolactone  25 mg Oral Daily  . thiamine  100 mg Intramuscular Once  . [START ON 11/01/2013] thiamine  100 mg Oral Daily  . vancomycin  1,000 mg Intravenous Q8H   Continuous Infusions:  PRN Meds:.acetaminophen, acetaminophen, albuterol, HYDROcodone-acetaminophen, hydrOXYzine, loperamide, LORazepam, morphine injection, ondansetron (ZOFRAN) IV, ondansetron, ondansetron  Allergies: No Known Allergies  Family History  Problem Relation Age of Onset  . Diabetes type II Mother   . Hypertension Mother     Social  History:  reports that he has been smoking Cigarettes.  He has a 20 pack-year smoking history. He does not have any smokeless tobacco history on file. He reports that he drinks about 7.2 ounces of alcohol per week. He reports that he does not use illicit drugs.  ROS: A complete review of systems was performed.  All systems are negative except for pertinent findings as noted.  Physical Exam:  Vital signs in last 24 hours: Temp:  [99.1 F (37.3 C)-99.4 F (37.4 C)] 99.2 F (37.3 C) (07/22 1813) Pulse Rate:  [88-111] 111 (07/22 1813) Resp:  [16-20] 18 (07/22 1238) BP: (128-163)/(87-111) 163/111 mmHg (07/22 1813) SpO2:  [96 %-100 %] 99 % (07/22 1813) Weight:  [98.884 kg (218 lb)-101.152 kg (223 lb)] 98.884 kg (218 lb) (07/22 1813) Constitutional:  Alert and oriented, No acute distress Cardiovascular: Regular rate and rhythm, No JVD Respiratory: Normal respiratory effort GI: Abdomen is soft, nontender, nondistended, no abdominal masses Genitourinary: No CVAT. Normal male phallus, testes are descended bilaterally and non-tender and without masses. Scrotum is indurated inferiorly with a small 2 cm fluctuant area noted in the midline inferiorly and anteriorly. He also has an indurated area that appears separate on the right lateral superior scrotum which has drained with purulent material seen. No further drainage can be expressed from this area. Rectal: Normal sphincter tone, no rectal masses, prostate is non tender and without nodularity. Lymphatic: No lymphadenopathy Neurologic: Grossly intact, no focal deficits Psychiatric: Normal mood and affect  Laboratory Data:   Recent Labs  10/31/13 1126 10/31/13 1136  WBC 15.7*  --   HGB 12.9* 13.6  HCT 37.7* 40.0  PLT 445*  --      Recent Labs  10/31/13 1136  NA 135*  K 4.7  CL 102  GLUCOSE 136*  BUN 19  CREATININE 0.90     Results for orders placed during the hospital encounter of 10/31/13 (from the past 24 hour(s))  CBC WITH  DIFFERENTIAL     Status: Abnormal   Collection Time    10/31/13 11:26 AM      Result Value Ref Range   WBC 15.7 (*) 4.0 - 10.5 K/uL   RBC 4.02 (*) 4.22 - 5.81 MIL/uL   Hemoglobin 12.9 (*) 13.0 - 17.0 g/dL   HCT 37.7 (*) 39.0 - 52.0 %   MCV 93.8  78.0 - 100.0 fL   MCH 32.1  26.0 - 34.0 pg   MCHC 34.2  30.0 - 36.0 g/dL   RDW 12.8  11.5 - 15.5 %   Platelets  445 (*) 150 - 400 K/uL   Neutrophils Relative % 81 (*) 43 - 77 %   Neutro Abs 12.7 (*) 1.7 - 7.7 K/uL   Lymphocytes Relative 10 (*) 12 - 46 %   Lymphs Abs 1.6  0.7 - 4.0 K/uL   Monocytes Relative 6  3 - 12 %   Monocytes Absolute 0.9  0.1 - 1.0 K/uL   Eosinophils Relative 3  0 - 5 %   Eosinophils Absolute 0.5  0.0 - 0.7 K/uL   Basophils Relative 0  0 - 1 %   Basophils Absolute 0.0  0.0 - 0.1 K/uL  I-STAT CHEM 8, ED     Status: Abnormal   Collection Time    10/31/13 11:36 AM      Result Value Ref Range   Sodium 135 (*) 137 - 147 mEq/L   Potassium 4.7  3.7 - 5.3 mEq/L   Chloride 102  96 - 112 mEq/L   BUN 19  6 - 23 mg/dL   Creatinine, Ser 0.90  0.50 - 1.35 mg/dL   Glucose, Bld 136 (*) 70 - 99 mg/dL   Calcium, Ion 1.17  1.12 - 1.23 mmol/L   TCO2 23  0 - 100 mmol/L   Hemoglobin 13.6  13.0 - 17.0 g/dL   HCT 40.0  39.0 - 52.0 %  I-STAT CG4 LACTIC ACID, ED     Status: None   Collection Time    10/31/13 11:39 AM      Result Value Ref Range   Lactic Acid, Venous 0.89  0.5 - 2.2 mmol/L  URINALYSIS, ROUTINE W REFLEX MICROSCOPIC     Status: Abnormal   Collection Time    10/31/13 12:12 PM      Result Value Ref Range   Color, Urine AMBER (*) YELLOW   APPearance HAZY (*) CLEAR   Specific Gravity, Urine 1.024  1.005 - 1.030   pH 5.0  5.0 - 8.0   Glucose, UA NEGATIVE  NEGATIVE mg/dL   Hgb urine dipstick NEGATIVE  NEGATIVE   Bilirubin Urine SMALL (*) NEGATIVE   Ketones, ur NEGATIVE  NEGATIVE mg/dL   Protein, ur >300 (*) NEGATIVE mg/dL   Urobilinogen, UA 1.0  0.0 - 1.0 mg/dL   Nitrite NEGATIVE  NEGATIVE   Leukocytes, UA SMALL (*)  NEGATIVE  URINE MICROSCOPIC-ADD ON     Status: Abnormal   Collection Time    10/31/13 12:12 PM      Result Value Ref Range   Squamous Epithelial / LPF RARE  RARE   WBC, UA 7-10  <3 WBC/hpf   Bacteria, UA FEW (*) RARE   Casts HYALINE CASTS (*) NEGATIVE  GLUCOSE, CAPILLARY     Status: Abnormal   Collection Time    10/31/13  6:21 PM      Result Value Ref Range   Glucose-Capillary 114 (*) 70 - 99 mg/dL   No results found for this or any previous visit (from the past 240 hour(s)).  Renal Function:  Recent Labs  10/31/13 1136  CREATININE 0.90   Estimated Creatinine Clearance: 126.2 ml/min (by C-G formula based on Cr of 0.9).    Radiologic Imaging: Ct Pelvis W Contrast  10/31/2013   CLINICAL DATA:  Scrotal pain and swelling for 2 days  EXAM: CT PELVIS WITH CONTRAST  TECHNIQUE: Multidetector CT imaging of the pelvis was performed using the standard protocol following the bolus administration of intravenous contrast.  CONTRAST:  122mL OMNIPAQUE IOHEXOL 300 MG/ML  SOLN  COMPARISON:  None.  FINDINGS: There is diffuse edema of the scrotum. A midline scrotal hypodensity surrounded by increased density measures 4 x 2 cm and may reflect a scrotal abscess. There is no gas within the soft tissues of the scrotum or extending into the perineal region. The inflammatory changes do not appear to extend superiorly along the inguinal canals into the pelvis. There are enlarged inguinal lymph nodes bilaterally.  Separate from the scrotum along the posterior aspect of the buttocks there is skin thickening and increased fat density on the left. Within the pelvic cavity no abnormal fluid collections or inflammatory changes are demonstrated. The bony structures are unremarkable.  IMPRESSION: 1. The findings are consistent with a scrotal abscess and diffuse scrotal wall edema. No gas collections are demonstrated. There are enlarged inguinal lymph nodes bilaterally. 2. There is change consistent with a cellulitis  involving the gluteal regions posteriorly, separate from the scrotal base. 3. There is no extension of inflammatory changes into the pelvic cavity.   Electronically Signed   By: David  Martinique   On: 10/31/2013 12:43    I independently reviewed the above imaging studies.  Procedure: I recommended incision and drainage of what appeared to be a superfical scrotal abscess based on his CT scan and confirmed with physical exam.  The fluctuant area anteriorly correlated with the CT findings. I consented the patient to perform a bedside I & D and discuss possible risks, complications, and the potential benefits. I explained that it would be preferable to avoid an anesthetic considering his cardiac history if possible and he agreed.  I prepped his scrotum with betadine and injected 3 cc of 2% lidocaine into the scrotal skin around the fluctuant area.  I then used a scalpel to incise a 2 cm opening with drainage of mostly serous fluid. A culture was obtained. I was able to place my finger into the opening and opened any small pockets that appeared to be confluent with this area. Overall, it measured about 2 x 5 cm  I then packed the opening with a wet to dry gauze dressing. The patient tolerated the procedure well and without complications.  Impression/Recommendation 1) Scrotal abscess: He underwent bedside I & D today. Culture has been sent.  I would recommend broad spectrum IV antibiotics covering gram positive, gram negative, and anaerobes pending his culture results.  I am hopeful that this will allow him to recover without the need to proceed to the OR for more aggressive drainage.  However, I would recommend cardiology see the patient if it would be necessary prior to any operative intervention as this is certainly possibly necessary in the next 1-2 days.   Oriah Leinweber,LES 10/31/2013, 7:23 PM    Pryor Curia MD  CC: Dr. Algis Liming

## 2013-11-01 DIAGNOSIS — F1010 Alcohol abuse, uncomplicated: Secondary | ICD-10-CM

## 2013-11-01 LAB — BASIC METABOLIC PANEL
ANION GAP: 12 (ref 5–15)
BUN: 19 mg/dL (ref 6–23)
CHLORIDE: 100 meq/L (ref 96–112)
CO2: 24 meq/L (ref 19–32)
CREATININE: 0.86 mg/dL (ref 0.50–1.35)
Calcium: 8.9 mg/dL (ref 8.4–10.5)
GFR calc Af Amer: >90 mL/min (ref >90)
GFR calc non Af Amer: >90 mL/min (ref >90)
Glucose, Bld: 156 mg/dL — ABNORMAL HIGH (ref 70–99)
Potassium: 4.8 mEq/L (ref 3.7–5.3)
Sodium: 136 mEq/L — ABNORMAL LOW (ref 137–147)

## 2013-11-01 LAB — GLUCOSE, CAPILLARY
GLUCOSE-CAPILLARY: 191 mg/dL — AB (ref 70–99)
GLUCOSE-CAPILLARY: 270 mg/dL — AB (ref 70–99)
Glucose-Capillary: 205 mg/dL — ABNORMAL HIGH (ref 70–99)
Glucose-Capillary: 237 mg/dL — ABNORMAL HIGH (ref 70–99)

## 2013-11-01 LAB — CBC
HCT: 36.1 % — ABNORMAL LOW (ref 39.0–52.0)
HEMOGLOBIN: 12.3 g/dL — AB (ref 13.0–17.0)
MCH: 31.9 pg (ref 26.0–34.0)
MCHC: 34.1 g/dL (ref 30.0–36.0)
MCV: 93.8 fL (ref 78.0–100.0)
Platelets: 411 10*3/uL — ABNORMAL HIGH (ref 150–400)
RBC: 3.85 MIL/uL — ABNORMAL LOW (ref 4.22–5.81)
RDW: 13.1 % (ref 11.5–15.5)
WBC: 12.9 10*3/uL — AB (ref 4.0–10.5)

## 2013-11-01 NOTE — Progress Notes (Signed)
Patient ID: Keyler Grigsby, male   DOB: 07-26-1970, 43 y.o.   MRN: [id number]    Subjective: Pt feels improved.  Pain is significantly decreased compared to prior to drainage of abscess. He has remained afebrile.  Objective: Vital signs in last 24 hours: Temp:  [98.5 F (36.9 C)-99.5 F (37.5 C)] 98.5 F (36.9 C) (07/23 0443) Pulse Rate:  [85-111] 85 (07/23 0600) Resp:  [17-20] 17 (07/23 0443) BP: (120-163)/(79-111) 133/89 mmHg (07/23 0600) SpO2:  [95 %-99 %] 95 % (07/23 0443) Weight:  [98.884 kg (218 lb)-99.6 kg (219 lb 9.3 oz)] 99.6 kg (219 lb 9.3 oz) (07/23 0443)  Intake/Output from previous day: 07/22 0701 - 07/23 0700 In: -  Out: 600 [Urine:600] Intake/Output this shift: Total I/O In: 360 [P.O.:360] Out: 400 [Urine:400]  Physical Exam:  General: Alert and oriented GU: Scrotal incision in inferior scrotum is packed with no apparent erythema and no crepitus.  There remains induration around the inferior scrotum with no fluctuant areas noted.  The right lateral area continues to drain minimal purulent material with induration also in this area but again without evidence of fluctuance.   Lab Results:  Recent Labs  10/31/13 1126 10/31/13 1136 11/01/13 0500  HGB 12.9* 13.6 12.3*  HCT 37.7* 40.0 36.1*   Lab Results  Component Value Date   WBC 12.9* 11/01/2013   HGB 12.3* 11/01/2013   HCT 36.1* 11/01/2013   MCV 93.8 11/01/2013   PLT 411* 11/01/2013     BMET  Recent Labs  10/31/13 1136 11/01/13 0500  NA 135* 136*  K 4.7 4.8  CL 102 100  CO2  --  24  GLUCOSE 136* 156*  BUN 19 19  CREATININE 0.90 0.86  CALCIUM  --  8.9     Assessment/Plan: 1) Scrotal abscess: Patient is clinically improved. Culture pending. Continue broad spectrum antibiotics and await culture results. Continue local wound care with wet to dry dressings BID.   LOS: 1 day   Sukhman Kocher,LES 11/01/2013, 2:41 PM

## 2013-11-01 NOTE — Progress Notes (Signed)
[facility] PROGRESS NOTE  Austin Alexander [id number] DOB: 07-02-1970 DOA: 10/31/2013 PCP: No PCP Per Patient  Assessment/Plan: 1. Scrotal abscess 1. Urology following 2. S/p debridement 3. On empiric vanc and zosyn 4. Hopeful for recovery without OR, however Urology has recommended Cardiac clearance should surgery be indicated in the next 1-2 days 2. DM 2 1. Recent a1c of 9.4 2. Diabetic coordinator consulted 3. HTN 1. BP stable, controlled 2. Cont current regimen 4. Systolic/diastolic chf 1. Presently compensated 2. EF in the 25-30% range 3. Cont monitor i/o and daily weights 5. Tobacco abuse 6. DVT prophylaxis 1. Lovenox 7. ETOH abuse 1. On CIWA  Code Status: Full Family Communication: Pt in room Disposition Plan: Pending   Consultants:  Urology  Antibiotics:  Vanc  Zosyn  HPI/Subjective: No complaints. No acute events noted overnight.  Objective: Filed Vitals:   10/31/13 1956 10/31/13 2343 11/01/13 0443 11/01/13 0600  BP: 120/87 127/79 132/90 133/89  Pulse: 105 85 90 85  Temp: 99.5 F (37.5 C)  98.5 F (36.9 C)   TempSrc: Oral  Oral   Resp: 20  17   Height:      Weight:   219 lb 9.3 oz (99.6 kg)   SpO2: 95%  95%     Intake/Output Summary (Last 24 hours) at 11/01/13 1256 Last data filed at 11/01/13 1004  Gross per 24 hour  Intake    360 ml  Output   1000 ml  Net   -640 ml   Filed Weights   10/31/13 1059 10/31/13 1813 11/01/13 0443  Weight: 223 lb (101.152 kg) 218 lb (98.884 kg) 219 lb 9.3 oz (99.6 kg)    Exam:   General:  Awake, in nad  Cardiovascular: regular, s1, s2  Respiratory: normal resp, effort, no wheezing  Abdomen: soft, nondistended*  Musculoskeletal: perfused, no clubbing   Data Reviewed: Basic Metabolic Panel:  Recent Labs Lab 10/31/13 1136 11/01/13 0500  NA 135* 136*  K 4.7 4.8  CL 102 100  CO2  --  24  GLUCOSE 136* 156*  BUN 19 19  CREATININE 0.90 0.86  CALCIUM  --  8.9   Liver Function  Tests: No results found for this basename: AST, ALT, ALKPHOS, BILITOT, PROT, ALBUMIN,  in the last 168 hours No results found for this basename: LIPASE, AMYLASE,  in the last 168 hours No results found for this basename: AMMONIA,  in the last 168 hours CBC:  Recent Labs Lab 10/31/13 1126 10/31/13 1136 11/01/13 0500  WBC 15.7*  --  12.9*  NEUTROABS 12.7*  --   --   HGB 12.9* 13.6 12.3*  HCT 37.7* 40.0 36.1*  MCV 93.8  --  93.8  PLT 445*  --  411*   Cardiac Enzymes: No results found for this basename: CKTOTAL, CKMB, CKMBINDEX, TROPONINI,  in the last 168 hours BNP (last 3 results)  Recent Labs  09/22/13 0344  PROBNP 1845.0*   CBG:  Recent Labs Lab 10/31/13 1821 10/31/13 2127 11/01/13 0755 11/01/13 1207  GLUCAP 114* 302* 191* 270*    Recent Results (from the past 240 hour(s))  CULTURE, BLOOD (ROUTINE X 2)     Status: None   Collection Time    10/31/13 11:43 AM      Result Value Ref Range Status   Specimen Description BLOOD LEFT ARM   Final   Special Requests BOTTLES DRAWN AEROBIC AND ANAEROBIC [facility]   Final   Culture  Setup Time     Final  Value: 10/31/2013 14:01     Performed at [facility]   Culture     Final   Value:        BLOOD CULTURE RECEIVED NO GROWTH TO DATE CULTURE WILL BE HELD FOR 5 DAYS BEFORE ISSUING A FINAL NEGATIVE REPORT     Performed at [facility]   Report Status PENDING   Incomplete  CULTURE, BLOOD (ROUTINE X 2)     Status: None   Collection Time    10/31/13 11:45 AM      Result Value Ref Range Status   Specimen Description BLOOD BLOOD RIGHT FOREARM   Final   Special Requests BOTTLES DRAWN AEROBIC AND ANAEROBIC 6CC   Final   Culture  Setup Time     Final   Value: 10/31/2013 14:01     Performed at [facility]   Culture     Final   Value:        BLOOD CULTURE RECEIVED NO GROWTH TO DATE CULTURE WILL BE HELD FOR 5 DAYS BEFORE ISSUING A FINAL NEGATIVE REPORT     Performed at [facility]   Report Status  PENDING   Incomplete  WOUND CULTURE     Status: None   Collection Time    10/31/13  5:56 PM      Result Value Ref Range Status   Specimen Description WOUND TESTICLE LEFT   Final   Special Requests NONE   Final   Gram Stain     Final   Value: MODERATE WBC PRESENT,BOTH PMN AND MONONUCLEAR     NO SQUAMOUS EPITHELIAL CELLS SEEN     NO ORGANISMS SEEN     Performed at [facility]   Culture     Final   Value: NO GROWTH 1 DAY     Performed at [facility]   Report Status PENDING   Incomplete     Studies: Ct Pelvis W Contrast  10/31/2013   CLINICAL DATA:  Scrotal pain and swelling for 2 days  EXAM: CT PELVIS WITH CONTRAST  TECHNIQUE: Multidetector CT imaging of the pelvis was performed using the standard protocol following the bolus administration of intravenous contrast.  CONTRAST:  134mL OMNIPAQUE IOHEXOL 300 MG/ML  SOLN  COMPARISON:  None.  FINDINGS: There is diffuse edema of the scrotum. A midline scrotal hypodensity surrounded by increased density measures 4 x 2 cm and may reflect a scrotal abscess. There is no gas within the soft tissues of the scrotum or extending into the perineal region. The inflammatory changes do not appear to extend superiorly along the inguinal canals into the pelvis. There are enlarged inguinal lymph nodes bilaterally.  Separate from the scrotum along the posterior aspect of the buttocks there is skin thickening and increased fat density on the left. Within the pelvic cavity no abnormal fluid collections or inflammatory changes are demonstrated. The bony structures are unremarkable.  IMPRESSION: 1. The findings are consistent with a scrotal abscess and diffuse scrotal wall edema. No gas collections are demonstrated. There are enlarged inguinal lymph nodes bilaterally. 2. There is change consistent with a cellulitis involving the gluteal regions posteriorly, separate from the scrotal base. 3. There is no extension of inflammatory changes into the pelvic  cavity.   Electronically Signed   By: David  Martinique   On: 10/31/2013 12:43   Scheduled Meds: . aspirin EC  81 mg Oral Daily  . carvedilol  12.5 mg Oral BID WC  . enoxaparin (LOVENOX) injection  40 mg Subcutaneous Q24H  . insulin aspart  0-5 Units Subcutaneous QHS  . insulin aspart  0-9 Units Subcutaneous TID WC  . lisinopril  10 mg Oral Daily  . multivitamin with minerals  1 tablet Oral Daily  . piperacillin-tazobactam (ZOSYN)  IV  3.375 g Intravenous 3 times per day  . simvastatin  40 mg Oral q1800  . sodium chloride  3 mL Intravenous Q12H  . spironolactone  25 mg Oral Daily  . thiamine  100 mg Oral Daily  . vancomycin  1,000 mg Intravenous Q8H   Continuous Infusions:   Principal Problem:   Scrotal abscess Active Problems:   HTN (hypertension)   Uncontrolled diabetes mellitus   Tobacco abuse   Systolic CHF, chronic   Cardiomyopathy   Alcohol abuse  Time spent: 20min  [facility], [facility] [facility] Pager [phone]. If 7PM-7AM, please contact night-coverage at [email or web address], password [facility] 11/01/2013, 12:56 PM  LOS: 1 day

## 2013-11-01 NOTE — Progress Notes (Signed)
Inpatient Diabetes Program Recommendations  AACE/ADA: New Consensus Statement on Inpatient Glycemic Control (2013)  Target Ranges:  Prepandial:   less than 140 mg/dL      Peak postprandial:   less than 180 mg/dL (1-2 hours)      Critically ill patients:  140 - 180 mg/dL   Inpatient Diabetes Program Recommendations Insulin - Basal: add Lantus 10 units  Insulin - Meal Coverage: add Novolog 5 units TID with meals A1C=9.4 on 09/22/13 will need follow-up after discharge for glycemic management   Thank you  Raoul Pitch BSN, RN,CDE Inpatient Diabetes Coordinator [phone] (team pager)

## 2013-11-02 DIAGNOSIS — F17200 Nicotine dependence, unspecified, uncomplicated: Secondary | ICD-10-CM

## 2013-11-02 LAB — COMPREHENSIVE METABOLIC PANEL
ALBUMIN: 2.4 g/dL — AB (ref 3.5–5.2)
ALT: 8 U/L (ref 0–53)
ANION GAP: 14 (ref 5–15)
AST: 9 U/L (ref 0–37)
Alkaline Phosphatase: 111 U/L (ref 39–117)
BUN: 16 mg/dL (ref 6–23)
CO2: 24 mEq/L (ref 19–32)
CREATININE: 0.88 mg/dL (ref 0.50–1.35)
Calcium: 9 mg/dL (ref 8.4–10.5)
Chloride: 96 mEq/L (ref 96–112)
GFR calc Af Amer: >90 mL/min (ref >90)
GFR calc non Af Amer: >90 mL/min (ref >90)
Glucose, Bld: 189 mg/dL — ABNORMAL HIGH (ref 70–99)
Potassium: 4.7 mEq/L (ref 3.7–5.3)
Sodium: 134 mEq/L — ABNORMAL LOW (ref 137–147)
TOTAL PROTEIN: 7.5 g/dL (ref 6.0–8.3)
Total Bilirubin: 0.3 mg/dL (ref 0.3–1.2)

## 2013-11-02 LAB — CBC
HEMATOCRIT: 36.3 % — AB (ref 39.0–52.0)
HEMOGLOBIN: 12.4 g/dL — AB (ref 13.0–17.0)
MCH: 32 pg (ref 26.0–34.0)
MCHC: 34.2 g/dL (ref 30.0–36.0)
MCV: 93.8 fL (ref 78.0–100.0)
Platelets: 430 10*3/uL — ABNORMAL HIGH (ref 150–400)
RBC: 3.87 MIL/uL — ABNORMAL LOW (ref 4.22–5.81)
RDW: 12.9 % (ref 11.5–15.5)
WBC: 13 10*3/uL — AB (ref 4.0–10.5)

## 2013-11-02 LAB — VANCOMYCIN, TROUGH: Vancomycin Tr: 17.9 ug/mL (ref 10.0–20.0)

## 2013-11-02 LAB — WOUND CULTURE: Culture: NO GROWTH

## 2013-11-02 LAB — GLUCOSE, CAPILLARY
GLUCOSE-CAPILLARY: 115 mg/dL — AB (ref 70–99)
GLUCOSE-CAPILLARY: 79 mg/dL (ref 70–99)
GLUCOSE-CAPILLARY: 82 mg/dL (ref 70–99)
Glucose-Capillary: 184 mg/dL — ABNORMAL HIGH (ref 70–99)

## 2013-11-02 MED ORDER — VANCOMYCIN HCL IN DEXTROSE 750-5 MG/150ML-% IV SOLN
750.0000 mg | Freq: Three times a day (TID) | INTRAVENOUS | Status: DC
  Administered 2013-11-03: 750 mg via INTRAVENOUS
  Filled 2013-11-02 (×3): qty 150

## 2013-11-02 MED ORDER — INSULIN ASPART 100 UNIT/ML ~~LOC~~ SOLN
5.0000 [IU] | Freq: Three times a day (TID) | SUBCUTANEOUS | Status: DC
  Administered 2013-11-02 – 2013-11-04 (×7): 5 [IU] via SUBCUTANEOUS

## 2013-11-02 MED ORDER — INSULIN GLARGINE 100 UNIT/ML ~~LOC~~ SOLN
10.0000 [IU] | Freq: Every day | SUBCUTANEOUS | Status: DC
  Administered 2013-11-02 – 2013-11-04 (×3): 10 [IU] via SUBCUTANEOUS
  Filled 2013-11-02 (×3): qty 0.1

## 2013-11-02 MED ORDER — VANCOMYCIN HCL IN DEXTROSE 750-5 MG/150ML-% IV SOLN
750.0000 mg | Freq: Once | INTRAVENOUS | Status: AC
  Administered 2013-11-02: 750 mg via INTRAVENOUS
  Filled 2013-11-02: qty 150

## 2013-11-02 NOTE — Progress Notes (Signed)
[facility] PROGRESS NOTE  Austin Alexander [id number] DOB: 03-Sep-1970 DOA: 10/31/2013 PCP: No PCP Per Patient  Assessment/Plan: 1. Scrotal abscess 1. Urology following 2. S/p debridement, packing in place 3. On empiric vanc and zosyn 4. Hopeful for continued improvement without need for OR 5. Should pt need surgery, will consult Cardiology for clearance 2. DM 2 1. Recent a1c of 9.4 2. Diabetic coordinator following 3. Glucose near 200 4. Lantus 10 and pre meal aspart at 5 units added 3. HTN 1. BP stable, controlled 2. Cont current regimen 4. Systolic/diastolic chf 1. Remains compensated 2. EF in the 25-30% range 3. Cont monitor i/o and daily weights 5. Tobacco abuse 6. DVT prophylaxis 1. Lovenox 7. ETOH abuse 1. On CIWA  Code Status: Full Family Communication: Pt in room Disposition Plan: Pending  Consultants:  Urology  Antibiotics:  Vanc  Zosyn  HPI/Subjective: No acute events. Pt with out complaints.  Objective: Filed Vitals:   11/01/13 2358 11/02/13 0442 11/02/13 0514 11/02/13 1007  BP: 127/79 141/91 137/93 127/87  Pulse: 89 75 74   Temp:  99 F (37.2 C)    TempSrc:  Oral    Resp:  18    Height:      Weight:  224 lb 6.9 oz (101.8 kg)    SpO2:  96%      Intake/Output Summary (Last 24 hours) at 11/02/13 1017 Last data filed at 11/02/13 0204  Gross per 24 hour  Intake      0 ml  Output   1400 ml  Net  -1400 ml   Filed Weights   10/31/13 1813 11/01/13 0443 11/02/13 0442  Weight: 218 lb (98.884 kg) 219 lb 9.3 oz (99.6 kg) 224 lb 6.9 oz (101.8 kg)    Exam:   General:  Awake, in no acute distress  Cardiovascular: regular, s1, s2  Respiratory: normal resp, effort, no wheezing or crackles  Abdomen: soft, nondistended, pos bowel sounds  Musculoskeletal: perfused, no clubbing   Data Reviewed: Basic Metabolic Panel:  Recent Labs Lab 10/31/13 1136 11/01/13 0500 11/02/13 0532  NA 135* 136* 134*  K 4.7 4.8 4.7  CL 102 100 96   CO2  --  24 24  GLUCOSE 136* 156* 189*  BUN 19 19 16   CREATININE 0.90 0.86 0.88  CALCIUM  --  8.9 9.0   Liver Function Tests:  Recent Labs Lab 11/02/13 0532  AST 9  ALT 8  ALKPHOS 111  BILITOT 0.3  PROT 7.5  ALBUMIN 2.4*   No results found for this basename: LIPASE, AMYLASE,  in the last 168 hours No results found for this basename: AMMONIA,  in the last 168 hours CBC:  Recent Labs Lab 10/31/13 1126 10/31/13 1136 11/01/13 0500 11/02/13 0532  WBC 15.7*  --  12.9* 13.0*  NEUTROABS 12.7*  --   --   --   HGB 12.9* 13.6 12.3* 12.4*  HCT 37.7* 40.0 36.1* 36.3*  MCV 93.8  --  93.8 93.8  PLT 445*  --  411* 430*   Cardiac Enzymes: No results found for this basename: CKTOTAL, CKMB, CKMBINDEX, TROPONINI,  in the last 168 hours BNP (last 3 results)  Recent Labs  09/22/13 0344  PROBNP 1845.0*   CBG:  Recent Labs Lab 11/01/13 0755 11/01/13 1207 11/01/13 1727 11/01/13 2224 11/02/13 0804  GLUCAP 191* 270* 205* 237* 184*    Recent Results (from the past 240 hour(s))  CULTURE, BLOOD (ROUTINE X 2)     Status: None  Collection Time    10/31/13 11:43 AM      Result Value Ref Range Status   Specimen Description BLOOD LEFT ARM   Final   Special Requests BOTTLES DRAWN AEROBIC AND ANAEROBIC [facility]   Final   Culture  Setup Time     Final   Value: 10/31/2013 14:01     Performed at [facility]   Culture     Final   Value:        BLOOD CULTURE RECEIVED NO GROWTH TO DATE CULTURE WILL BE HELD FOR 5 DAYS BEFORE ISSUING A FINAL NEGATIVE REPORT     Performed at [facility]   Report Status PENDING   Incomplete  CULTURE, BLOOD (ROUTINE X 2)     Status: None   Collection Time    10/31/13 11:45 AM      Result Value Ref Range Status   Specimen Description BLOOD BLOOD RIGHT FOREARM   Final   Special Requests BOTTLES DRAWN AEROBIC AND ANAEROBIC 6CC   Final   Culture  Setup Time     Final   Value: 10/31/2013 14:01     Performed at [facility]   Culture      Final   Value:        BLOOD CULTURE RECEIVED NO GROWTH TO DATE CULTURE WILL BE HELD FOR 5 DAYS BEFORE ISSUING A FINAL NEGATIVE REPORT     Performed at [facility]   Report Status PENDING   Incomplete  WOUND CULTURE     Status: None   Collection Time    10/31/13  5:56 PM      Result Value Ref Range Status   Specimen Description WOUND TESTICLE LEFT   Final   Special Requests NONE   Final   Gram Stain     Final   Value: MODERATE WBC PRESENT,BOTH PMN AND MONONUCLEAR     NO SQUAMOUS EPITHELIAL CELLS SEEN     NO ORGANISMS SEEN     Performed at [facility]   Culture     Final   Value: NO GROWTH 2 DAYS     Performed at [facility]   Report Status 11/02/2013 FINAL   Final     Studies: Ct Pelvis W Contrast  10/31/2013   CLINICAL DATA:  Scrotal pain and swelling for 2 days  EXAM: CT PELVIS WITH CONTRAST  TECHNIQUE: Multidetector CT imaging of the pelvis was performed using the standard protocol following the bolus administration of intravenous contrast.  CONTRAST:  127mL OMNIPAQUE IOHEXOL 300 MG/ML  SOLN  COMPARISON:  None.  FINDINGS: There is diffuse edema of the scrotum. A midline scrotal hypodensity surrounded by increased density measures 4 x 2 cm and may reflect a scrotal abscess. There is no gas within the soft tissues of the scrotum or extending into the perineal region. The inflammatory changes do not appear to extend superiorly along the inguinal canals into the pelvis. There are enlarged inguinal lymph nodes bilaterally.  Separate from the scrotum along the posterior aspect of the buttocks there is skin thickening and increased fat density on the left. Within the pelvic cavity no abnormal fluid collections or inflammatory changes are demonstrated. The bony structures are unremarkable.  IMPRESSION: 1. The findings are consistent with a scrotal abscess and diffuse scrotal wall edema. No gas collections are demonstrated. There are enlarged inguinal lymph nodes  bilaterally. 2. There is change consistent with a cellulitis involving the gluteal regions posteriorly, separate from the  scrotal base. 3. There is no extension of inflammatory changes into the pelvic cavity.   Electronically Signed   By: David  Martinique   On: 10/31/2013 12:43   Scheduled Meds: . aspirin EC  81 mg Oral Daily  . carvedilol  12.5 mg Oral BID WC  . enoxaparin (LOVENOX) injection  40 mg Subcutaneous Q24H  . insulin aspart  0-5 Units Subcutaneous QHS  . insulin aspart  0-9 Units Subcutaneous TID WC  . lisinopril  10 mg Oral Daily  . multivitamin with minerals  1 tablet Oral Daily  . piperacillin-tazobactam (ZOSYN)  IV  3.375 g Intravenous 3 times per day  . simvastatin  40 mg Oral q1800  . sodium chloride  3 mL Intravenous Q12H  . spironolactone  25 mg Oral Daily  . thiamine  100 mg Oral Daily  . vancomycin  1,000 mg Intravenous Q8H   Continuous Infusions:   Principal Problem:   Scrotal abscess Active Problems:   HTN (hypertension)   Uncontrolled diabetes mellitus   Tobacco abuse   Systolic CHF, chronic   Cardiomyopathy   Alcohol abuse  Time spent: 66min  Ticara Waner, [place] [facility] Pager [phone]. If 7PM-7AM, please contact night-coverage at [email or web address], password [facility] 11/02/2013, 10:17 AM  LOS: 2 days

## 2013-11-02 NOTE — Progress Notes (Signed)
ANTIBIOTIC CONSULT NOTE  Pharmacy Consult for Vancomycin and Zosyn Indication: scrotal abscess  No Known Allergies  Patient Measurements: Height: 6' (182.9 cm) Weight: 224 lb 6.9 oz (101.8 kg) IBW/kg (Calculated) : 77.6  Vital Signs: Temp: 97.9 F (36.6 C) (07/24 1806) Temp src: Oral (07/24 1806) BP: 141/93 mmHg (07/24 1806) Pulse Rate: 95 (07/24 1806) Intake/Output from previous day: 07/23 0701 - 07/24 0700 In: 360 [P.O.:360] Out: 1800 [Urine:1800] Intake/Output from this shift:    Labs:  Recent Labs  10/31/13 1126 10/31/13 1136 11/01/13 0500 11/02/13 0532  WBC 15.7*  --  12.9* 13.0*  HGB 12.9* 13.6 12.3* 12.4*  PLT 445*  --  411* 430*  CREATININE  --  0.90 0.86 0.88   Estimated Creatinine Clearance: 130.9 ml/min (by C-G formula based on Cr of 0.88).  Recent Labs  11/02/13 2010  [place] 17.9     Microbiology: Recent Results (from the past 720 hour(s))  CULTURE, BLOOD (ROUTINE X 2)     Status: None   Collection Time    10/31/13 11:43 AM      Result Value Ref Range Status   Specimen Description BLOOD LEFT ARM   Final   Special Requests BOTTLES DRAWN AEROBIC AND ANAEROBIC [facility]   Final   Culture  Setup Time     Final   Value: 10/31/2013 14:01     Performed at [facility]   Culture     Final   Value:        BLOOD CULTURE RECEIVED NO GROWTH TO DATE CULTURE WILL BE HELD FOR 5 DAYS BEFORE ISSUING A FINAL NEGATIVE REPORT     Performed at [facility]   Report Status PENDING   Incomplete  CULTURE, BLOOD (ROUTINE X 2)     Status: None   Collection Time    10/31/13 11:45 AM      Result Value Ref Range Status   Specimen Description BLOOD BLOOD RIGHT FOREARM   Final   Special Requests BOTTLES DRAWN AEROBIC AND ANAEROBIC 6CC   Final   Culture  Setup Time     Final   Value: 10/31/2013 14:01     Performed at [facility]   Culture     Final   Value:        BLOOD CULTURE RECEIVED NO GROWTH TO DATE CULTURE WILL BE HELD FOR 5 DAYS BEFORE  ISSUING A FINAL NEGATIVE REPORT     Performed at [facility]   Report Status PENDING   Incomplete  WOUND CULTURE     Status: None   Collection Time    10/31/13  5:56 PM      Result Value Ref Range Status   Specimen Description WOUND TESTICLE LEFT   Final   Special Requests NONE   Final   Gram Stain     Final   Value: MODERATE WBC PRESENT,BOTH PMN AND MONONUCLEAR     NO SQUAMOUS EPITHELIAL CELLS SEEN     NO ORGANISMS SEEN     Performed at [facility]   Culture     Final   Value: NO GROWTH 2 DAYS     Performed at [facility]   Report Status 11/02/2013 FINAL   Final    Medical History: Past Medical History  Diagnosis Date  . Bronchitis   . Croup   . Tobacco abuse   . Dizziness   . Abscess of perineum   . Heartburn   . Indigestion   .  H/O folliculitis   . 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 Alcohol abuse   . Hypertension     variable in nature  . Hyperlipidemia   . Multiple excoriations     multifactorial in origin  . Mild obesity   . Dietary noncompliance     History of,  . Situational stress     secondary to financial issues  . Erectile dysfunction     Multifactorial. Improved with Cialis. New onset.  . Cystic acne     with acne keloidosis  . Diabetes mellitus without complication     Type 2, uncontrolled with questionable improvement  . 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 EKG, abnormal     History of, with negative cardiac evaluation by history.  [name] COPD (chronic obstructive pulmonary disease)     moderate obstruction with low vital capacity  . Asthma     History of,    Medications:  Prescriptions prior to admission  Medication Sig Dispense Refill  . aspirin EC 81 MG EC tablet Take 1 tablet (81 mg total) by mouth daily.  30 tablet  6  . carvedilol (COREG) 12.5 MG tablet Take 1 tablet (12.5 mg total) by mouth 2 (two) times daily with a meal.  30 tablet  0  . lisinopril  (PRINIVIL,ZESTRIL) 10 MG tablet Take 1 tablet (10 mg total) by mouth daily.  30 tablet  0  . metFORMIN (GLUCOPHAGE) 500 MG tablet Take 1 tablet (500 mg total) by mouth 2 (two) times daily with a meal.  60 tablet  0  . Multiple Vitamins-Minerals (CENTRUM ADULTS PO) Take 1 tablet by mouth daily.      . simvastatin (ZOCOR) 40 MG tablet Take 1 tablet (40 mg total) by mouth daily.  30 tablet  0  . spironolactone (ALDACTONE) 25 MG tablet Take 1 tablet (25 mg total) by mouth daily.  30 tablet  0   Assessment: 43 y.o. male presents with scrotal pain, swelling, and discharge. S/p I&D of scrotal abscess in ED. To continue broad spectrum antibiotics (Vanc and Zosyn) for scrotal abscess. Estimated CrCl >100 ml/min. WBC elevated, but trending down now.  VT returns at 17.9 which is slightly supratherapeutic.  Per MD note plan to change to PO antibiotics soon.    Goal of Therapy:  Vancomycin trough level 10-15 mcg/ml  Plan:  1. Zosyn 3.375gm IV q8h. Each dose over 4 hours 2. Change Vancomycin to 750mg  IV q8h. 3. Will f/u renal function, micro data, pt's clinical condition, transition to PO 4. Vanc trough prn

## 2013-11-02 NOTE — Progress Notes (Signed)
Patient ID: Austin Alexander, male   DOB: 1970/06/15, 43 y.o.   MRN: [id number]    Subjective: Patient states that scrotal pain is about the same as yesterday and still improved from baseline on admission. No fever last 24 hours.  Patient states that dressing has not been changed since I initially packed it in the ER.  Objective: Vital signs in last 24 hours: Temp:  [97.3 F (36.3 C)-99 F (37.2 C)] 99 F (37.2 C) (07/24 0442) Pulse Rate:  [74-90] 74 (07/24 0514) Resp:  [18] 18 (07/24 0442) BP: (127-141)/(79-95) 127/87 mmHg (07/24 1007) SpO2:  [96 %-100 %] 96 % (07/24 0442) Weight:  [101.8 kg (224 lb 6.9 oz)] 101.8 kg (224 lb 6.9 oz) (07/24 0442)  Intake/Output from previous day: 07/23 0701 - 07/24 0700 In: 360 [P.O.:360] Out: 1800 [Urine:1800] Intake/Output this shift:    Physical Exam:  General: Alert and oriented GU: Exam is unchanged from yesterday. No scrotal erythema or crepitus. He continues to have significant indurated area in right superolateral scrotum (1.5 x 3.5 cm), inferior scrotum near incision site (5 x 3 cm) and left lateral scrotum (2 x 2 cm). No purulent drainage noted. I attempted to take dressing out but patient requires pain medication.  Lab Results:  Recent Labs  10/31/13 1136 11/01/13 0500 11/02/13 0532  HGB 13.6 12.3* 12.4*  HCT 40.0 36.1* 36.3*   Lab Results  Component Value Date   WBC 13.0* 11/02/2013   HGB 12.4* 11/02/2013   HCT 36.3* 11/02/2013   MCV 93.8 11/02/2013   PLT 430* 11/02/2013     BMET  Recent Labs  11/01/13 0500 11/02/13 0532  NA 136* 134*  K 4.8 4.7  CL 100 96  CO2 24 24  GLUCOSE 156* 189*  BUN 19 16  CREATININE 0.86 0.88  CALCIUM 8.9 9.0     Studies/Results: Wound culture with no growth.  Assessment/Plan: Scrotal abscess: Patient needs to be getting dressing changes with wet to dry saline dressings packed into scrotal wound BID.  Please provide pain medication prior to changes if necessary. Patient is otherwise  clinically similar today but not markedly improved. I think it would be ok to transition him to broad spectrum po antibiotics at this time with gram positive, gram negative, and anaerobic coverage. I would ask ID for specific recommendations for treatment.  Patient will then need continue antibiotic therapy for 2 weeks after discharge and needs continued wet to dry dressing changes to scrotal wound. He may need home health care to provide dressing changes. If he continues to demonstrate clinical improvement, he may then be discharged home with outpatient followup in about 2 weeks for further assessment. Will continue to follow.   LOS: 2 days   Judah Carchi,LES 11/02/2013, 12:37 PM

## 2013-11-03 DIAGNOSIS — I428 Other cardiomyopathies: Secondary | ICD-10-CM

## 2013-11-03 LAB — CBC
HCT: 37.1 % — ABNORMAL LOW (ref 39.0–52.0)
Hemoglobin: 12.8 g/dL — ABNORMAL LOW (ref 13.0–17.0)
MCH: 32.2 pg (ref 26.0–34.0)
MCHC: 34.5 g/dL (ref 30.0–36.0)
MCV: 93.2 fL (ref 78.0–100.0)
Platelets: 469 10*3/uL — ABNORMAL HIGH (ref 150–400)
RBC: 3.98 MIL/uL — ABNORMAL LOW (ref 4.22–5.81)
RDW: 12.9 % (ref 11.5–15.5)
WBC: 13.9 10*3/uL — ABNORMAL HIGH (ref 4.0–10.5)

## 2013-11-03 LAB — GLUCOSE, CAPILLARY
GLUCOSE-CAPILLARY: 102 mg/dL — AB (ref 70–99)
Glucose-Capillary: 155 mg/dL — ABNORMAL HIGH (ref 70–99)
Glucose-Capillary: 160 mg/dL — ABNORMAL HIGH (ref 70–99)
Glucose-Capillary: 178 mg/dL — ABNORMAL HIGH (ref 70–99)

## 2013-11-03 MED ORDER — CLINDAMYCIN HCL 150 MG PO CAPS
150.0000 mg | ORAL_CAPSULE | Freq: Four times a day (QID) | ORAL | Status: DC
  Administered 2013-11-03 – 2013-11-04 (×5): 150 mg via ORAL
  Filled 2013-11-03 (×8): qty 1

## 2013-11-03 MED ORDER — SACCHAROMYCES BOULARDII 250 MG PO CAPS
250.0000 mg | ORAL_CAPSULE | Freq: Two times a day (BID) | ORAL | Status: DC
  Administered 2013-11-03 – 2013-11-04 (×3): 250 mg via ORAL
  Filled 2013-11-03 (×4): qty 1

## 2013-11-03 NOTE — Progress Notes (Signed)
Patient ID: Austin Alexander, male   DOB: [birth year], 43 y.o.   MRN: [id number]    Subjective: Patient reports massively improved pain. Dressing last changed 2 hrs ago. No new complaints.  Objective: Vital signs in last 24 hours: Temp:  [97.9 F (36.6 C)-99 F (37.2 C)] 99 F (37.2 C) (07/25 0515) Pulse Rate:  [82-95] 92 (07/25 0515) Resp:  [17-18] 18 (07/25 0515) BP: (127-147)/(87-104) 142/96 mmHg (07/25 0515) SpO2:  [95 %-98 %] 97 % (07/25 0515) Weight:  [101.2 kg (223 lb 1.7 oz)] 101.2 kg (223 lb 1.7 oz) (07/25 0515)  Intake/Output from previous day: 07/24 0701 - 07/25 0700 In: -  Out: 1875 [Urine:1875] Intake/Output this shift:    Physical Exam:  General: Alert and oriented GU: Exam is unchanged from yesterday. No scrotal erythema or crepitus. He has a ~3cm indurated area inferiorly, although this is completely nontender. Some linear dressing is emanating from a 1 cm incision site in the anterior scrotum without overt purulence.   Lab Results:  Recent Labs  10/31/13 1136 11/01/13 0500 11/02/13 0532  HGB 13.6 12.3* 12.4*  HCT 40.0 36.1* 36.3*   Lab Results  Component Value Date   WBC 13.0* 11/02/2013   HGB 12.4* 11/02/2013   HCT 36.3* 11/02/2013   MCV 93.8 11/02/2013   PLT 430* 11/02/2013     BMET  Recent Labs  11/01/13 0500 11/02/13 0532  NA 136* 134*  K 4.8 4.7  CL 100 96  CO2 24 24  GLUCOSE 156* 189*  BUN 19 16  CREATININE 0.86 0.88  CALCIUM 8.9 9.0     Studies/Results: Wound culture with no growth.  Assessment/Plan: Scrotal abscess: Doing well. Improving. I think the indurated area inferiorly is resolving inflammatory tissue given it's lack of erythema and tenderness and does not represent any untreated infection.  As prior notes indicate, I think OK to transition to PO abx (clinda, bactrim, etc), in anticipation of discharge soon.  Patient seems motivated to participate in his own care and exchange the dressing himself, even at home.   LOS: 3  days   Austin Alexander, Austin Alexander 11/03/2013, 8:35 AM

## 2013-11-03 NOTE — Progress Notes (Signed)
[facility] PROGRESS NOTE  Austin Alexander [id number] DOB: Oct 05, 1970 DOA: 10/31/2013 PCP: No PCP Per Patient  Assessment/Plan: 1. Scrotal abscess 1. Urology following 2. S/p debridement, packing in place 3. Had been on empiric vanc and zosyn, will transition to PO clinda today 4. If pt remains stable, afebrile overnight, consider discharge at that time 2. DM 2 1. Recent a1c of 9.4 2. Diabetic coordinator following 3. Glucose near 200, improved with Lantus 10 and pre meal aspart at 5 units 3. HTN 1. BP stable, controlled 2. Cont current regimen 4. Systolic/diastolic chf 1. Remains compensated 2. EF in the 25-30% range 3. Cont monitor i/o and daily weights 5. Tobacco abuse 6. DVT prophylaxis 1. Lovenox 7. ETOH abuse 1. On CIWA  Code Status: Full Family Communication: Pt in room Disposition Plan: Pending  Consultants:  Urology  Antibiotics:  Vanc  Zosyn  Clindamycin added 7/25  HPI/Subjective: No acute events. Pt eager to go home. Objective: Filed Vitals:   11/02/13 1806 11/02/13 2144 11/03/13 0129 11/03/13 0515  BP: 141/93 133/94 140/101 142/96  Pulse: 95 91 94 92  Temp: 97.9 F (36.6 C) 98.9 F (37.2 C) 99 F (37.2 C) 99 F (37.2 C)  TempSrc: Oral Oral Oral Oral  Resp: 18 17 17 18   Height:      Weight:    101.2 kg (223 lb 1.7 oz)  SpO2: 95% 98% 96% 97%    Intake/Output Summary (Last 24 hours) at 11/03/13 1152 Last data filed at 11/03/13 0521  Gross per 24 hour  Intake      0 ml  Output   1875 ml  Net  -1875 ml   Filed Weights   11/01/13 0443 11/02/13 0442 11/03/13 0515  Weight: 99.6 kg (219 lb 9.3 oz) 101.8 kg (224 lb 6.9 oz) 101.2 kg (223 lb 1.7 oz)    Exam:   General:  Awake, in no acute distress  Cardiovascular: regular, s1, s2  Respiratory: normal resp, effort, no wheezing or crackles  Abdomen: soft, nondistended, pos bowel sounds  Musculoskeletal: perfused, no clubbing   Data Reviewed: Basic Metabolic  Panel:  Recent Labs Lab 10/31/13 1136 11/01/13 0500 11/02/13 0532  NA 135* 136* 134*  K 4.7 4.8 4.7  CL 102 100 96  CO2  --  24 24  GLUCOSE 136* 156* 189*  BUN 19 19 16   CREATININE 0.90 0.86 0.88  CALCIUM  --  8.9 9.0   Liver Function Tests:  Recent Labs Lab 11/02/13 0532  AST 9  ALT 8  ALKPHOS 111  BILITOT 0.3  PROT 7.5  ALBUMIN 2.4*   No results found for this basename: LIPASE, AMYLASE,  in the last 168 hours No results found for this basename: AMMONIA,  in the last 168 hours CBC:  Recent Labs Lab 10/31/13 1126 10/31/13 1136 11/01/13 0500 11/02/13 0532 11/03/13 0917  WBC 15.7*  --  12.9* 13.0* 13.9*  NEUTROABS 12.7*  --   --   --   --   HGB 12.9* 13.6 12.3* 12.4* 12.8*  HCT 37.7* 40.0 36.1* 36.3* 37.1*  MCV 93.8  --  93.8 93.8 93.2  PLT 445*  --  411* 430* 469*   Cardiac Enzymes: No results found for this basename: CKTOTAL, CKMB, CKMBINDEX, TROPONINI,  in the last 168 hours BNP (last 3 results)  Recent Labs  09/22/13 0344  PROBNP 1845.0*   CBG:  Recent Labs Lab 11/02/13 1238 11/02/13 1649 11/02/13 2152 11/03/13 0803 11/03/13 1138  GLUCAP 115* 79 82  102* 155*    Recent Results (from the past 240 hour(s))  CULTURE, BLOOD (ROUTINE X 2)     Status: None   Collection Time    10/31/13 11:43 AM      Result Value Ref Range Status   Specimen Description BLOOD LEFT ARM   Final   Special Requests BOTTLES DRAWN AEROBIC AND ANAEROBIC [facility]   Final   Culture  Setup Time     Final   Value: 10/31/2013 14:01     Performed at [facility]   Culture     Final   Value:        BLOOD CULTURE RECEIVED NO GROWTH TO DATE CULTURE WILL BE HELD FOR 5 DAYS BEFORE ISSUING A FINAL NEGATIVE REPORT     Performed at [facility]   Report Status PENDING   Incomplete  CULTURE, BLOOD (ROUTINE X 2)     Status: None   Collection Time    10/31/13 11:45 AM      Result Value Ref Range Status   Specimen Description BLOOD BLOOD RIGHT FOREARM   Final   Special  Requests BOTTLES DRAWN AEROBIC AND ANAEROBIC 6CC   Final   Culture  Setup Time     Final   Value: 10/31/2013 14:01     Performed at [facility]   Culture     Final   Value:        BLOOD CULTURE RECEIVED NO GROWTH TO DATE CULTURE WILL BE HELD FOR 5 DAYS BEFORE ISSUING A FINAL NEGATIVE REPORT     Performed at [facility]   Report Status PENDING   Incomplete  WOUND CULTURE     Status: None   Collection Time    10/31/13  5:56 PM      Result Value Ref Range Status   Specimen Description WOUND TESTICLE LEFT   Final   Special Requests NONE   Final   Gram Stain     Final   Value: MODERATE WBC PRESENT,BOTH PMN AND MONONUCLEAR     NO SQUAMOUS EPITHELIAL CELLS SEEN     NO ORGANISMS SEEN     Performed at [facility]   Culture     Final   Value: NO GROWTH 2 DAYS     Performed at [facility]   Report Status 11/02/2013 FINAL   Final     Studies: No results found. Scheduled Meds: . aspirin EC  81 mg Oral Daily  . carvedilol  12.5 mg Oral BID WC  . clindamycin  150 mg Oral 4 times per day  . enoxaparin (LOVENOX) injection  40 mg Subcutaneous Q24H  . insulin aspart  0-5 Units Subcutaneous QHS  . insulin aspart  0-9 Units Subcutaneous TID WC  . insulin aspart  5 Units Subcutaneous TID WC  . insulin glargine  10 Units Subcutaneous Daily  . lisinopril  10 mg Oral Daily  . multivitamin with minerals  1 tablet Oral Daily  . saccharomyces boulardii  250 mg Oral BID  . simvastatin  40 mg Oral q1800  . sodium chloride  3 mL Intravenous Q12H  . spironolactone  25 mg Oral Daily  . thiamine  100 mg Oral Daily   Continuous Infusions:   Principal Problem:   Scrotal abscess Active Problems:   HTN (hypertension)   Uncontrolled diabetes mellitus   Tobacco abuse   Systolic CHF, chronic   Cardiomyopathy   Alcohol abuse  Time spent: 31min  Austin Alexander  K  [facility] Pager [phone]. If 7PM-7AM, please contact night-coverage at [email or web address], password  [facility] 11/03/2013, 11:52 AM  LOS: 3 days

## 2013-11-03 NOTE — Progress Notes (Signed)
Agree with Dr. Ferguson's A/P.  

## 2013-11-04 LAB — CBC
HCT: 37.5 % — ABNORMAL LOW (ref 39.0–52.0)
Hemoglobin: 13 g/dL (ref 13.0–17.0)
MCH: 32.2 pg (ref 26.0–34.0)
MCHC: 34.7 g/dL (ref 30.0–36.0)
MCV: 92.8 fL (ref 78.0–100.0)
PLATELETS: 468 10*3/uL — AB (ref 150–400)
RBC: 4.04 MIL/uL — ABNORMAL LOW (ref 4.22–5.81)
RDW: 12.6 % (ref 11.5–15.5)
WBC: 9.5 10*3/uL (ref 4.0–10.5)

## 2013-11-04 LAB — GLUCOSE, CAPILLARY
Glucose-Capillary: 163 mg/dL — ABNORMAL HIGH (ref 70–99)
Glucose-Capillary: 230 mg/dL — ABNORMAL HIGH (ref 70–99)

## 2013-11-04 MED ORDER — HYDROCODONE-ACETAMINOPHEN 5-325 MG PO TABS: 1.0000 | ORAL_TABLET | ORAL | Status: DC | PRN

## 2013-11-04 MED ORDER — CLINDAMYCIN HCL 150 MG PO CAPS: 150.0000 mg | ORAL_CAPSULE | Freq: Four times a day (QID) | ORAL | Status: DC

## 2013-11-04 MED ORDER — SACCHAROMYCES BOULARDII 250 MG PO CAPS: 250.0000 mg | ORAL_CAPSULE | Freq: Two times a day (BID) | ORAL | Status: DC

## 2013-11-04 NOTE — Care Management Note (Signed)
    Page 1 of 1   11/04/2013     5:16:48 PM CARE MANAGEMENT NOTE 11/04/2013  Patient:  Austin Alexander, Austin Alexander   Account Number:  [id number]  Date Initiated:  11/04/2013  Documentation initiated by:  [facility]  Subjective/Objective Assessment:   adm: Scrotal abscess     Action/Plan:   discharge plan   Anticipated DC Date:  11/04/2013   Anticipated DC Plan:  [place]  CM consult  [place] [facility]  Medication Assistance      Choice offered to / List presented to:          [facility] arranged  HH-1 RN      Status of service:  Completed, signed off Medicare Important Message given?   (If response is "NO", the following Medicare IM given date fields will be blank) Date Medicare IM given:   Medicare IM given by:   Date Additional Medicare IM given:   Additional Medicare IM given by:    Discharge Disposition:  [place]  Per UR Regulation:    If discussed at Long Length of Stay Meetings, dates discussed:    Comments:  11/04/13 10:00 CM met with pt and gave pt [place] letter with list of participating pharmacies.  Pt verbalized understanding of MATCH parameters.  CM also gave pt [facility] pamphlet and ptverbalized he is to lgo to the clinic any morning from 9-10am to get an appt for a PCP, insurance and make a followup medical care appt.  Referrl texted tpo [facility], [place].  No other CM needs were communicated.  Mariane Masters, BSN, Cm [phone].

## 2013-11-04 NOTE — Progress Notes (Signed)
Nsg Discharge Note  Admit Date:  10/31/2013 Discharge date: 11/04/2013   Austin Alexander to be D/C'd Home per MD order.  AVS completed.  Copy for chart, and copy for patient signed, and dated. Patient/caregiver able to verbalize understanding.  Discharge Medication:   Medication List         aspirin 81 MG EC tablet  Take 1 tablet (81 mg total) by mouth daily.     carvedilol 12.5 MG tablet  Commonly known as:  COREG  Take 1 tablet (12.5 mg total) by mouth 2 (two) times daily with a meal.     CENTRUM ADULTS PO  Take 1 tablet by mouth daily.     clindamycin 150 MG capsule  Commonly known as:  CLEOCIN  Take 1 capsule (150 mg total) by mouth every 6 (six) hours.     HYDROcodone-acetaminophen 5-325 MG per tablet  Commonly known as:  NORCO/VICODIN  Take 1-2 tablets by mouth every 4 (four) hours as needed for moderate pain.     lisinopril 10 MG tablet  Commonly known as:  PRINIVIL,ZESTRIL  Take 1 tablet (10 mg total) by mouth daily.     metFORMIN 500 MG tablet  Commonly known as:  GLUCOPHAGE  Take 1 tablet (500 mg total) by mouth 2 (two) times daily with a meal.     saccharomyces boulardii 250 MG capsule  Commonly known as:  FLORASTOR  Take 1 capsule (250 mg total) by mouth 2 (two) times daily.     simvastatin 40 MG tablet  Commonly known as:  ZOCOR  Take 1 tablet (40 mg total) by mouth daily.     spironolactone 25 MG tablet  Commonly known as:  ALDACTONE  Take 1 tablet (25 mg total) by mouth daily.        Discharge Assessment: Filed Vitals:   11/04/13 0920  BP: 127/88  Pulse:   Temp:   Resp:   Wound noted, and wet to dry changed.  IV catheter discontinued intact. Site without signs and symptoms of complications - no redness or edema noted at insertion site, patient denies c/o pain - only slight tenderness at site.  Dressing with slight pressure applied.  D/c Instructions-Education: Discharge instructions given to patient/family with verbalized understanding. D/c  education completed with patient/family including follow up instructions, medication list, d/c activities limitations if indicated, with other d/c instructions as indicated by MD - patient able to verbalize understanding, all questions fully answered. Patient instructed to return to ED, call 911, or call MD for any changes in condition.  Patient escorted via [place], and D/C home via private auto.  Dayle Points, RN 11/04/2013 2:05 PM

## 2013-11-04 NOTE — Discharge Summary (Signed)
Physician Discharge Summary  Austin Alexander [id number] DOB: 1970-12-26 DOA: 10/31/2013  PCP: No PCP Per Patient  Admit date: 10/31/2013 Discharge date: 11/04/2013  Time spent: 35 minutes  Recommendations for Outpatient Follow-up:  Follow up with PCP as scheduled Follow up with Urology in 2 weeks Local wet to dry dressings BID  Discharge Diagnoses:  Principal Problem:   Scrotal abscess Active Problems:   HTN (hypertension)   Uncontrolled diabetes mellitus   Tobacco abuse   Systolic CHF, chronic   Cardiomyopathy   Alcohol abuse   Discharge Condition: Improved  Diet recommendation: Diabetic  Filed Weights   11/02/13 0442 11/03/13 0515 11/04/13 0505  Weight: 101.8 kg (224 lb 6.9 oz) 101.2 kg (223 lb 1.7 oz) 99.519 kg (219 lb 6.4 oz)    History of present illness:  See h and p from 7/22 for details. Briefly, pt presented with scrotal pain with drainage, admitted for further management of scrotal abscess.  Hospital Course:  1. Scrotal abscess  1. Urology abscess 2. S/p debridement per Urology, packing in place with local wet to dry dressings bid 3. Had been on empiric vanc and zosyn, transitioned to PO clinda 7/25 with Urology recs for 2 weeks of PO abx after discharge 4. Pt also prescribed florastor with abx 2. DM 2  1. Recent a1c of 9.4 2. Diabetic coordinator following 3. Glucose near 200, improved with Lantus 10 and pre meal aspart at 5 units 4. Pt to resume home DM regimen on discharge, to be managed by PCP 3. HTN  1. BP stable, controlled 2. Cont current regimen 4. Systolic/diastolic chf  1. Remains compensated 2. EF in the 25-30% range 3. Cont monitor i/o and daily weights 5. Tobacco abuse 6. DVT prophylaxis  1. Lovenox while inpt 7. ETOH abuse  1. On CIWA 2. No issues with etoh withdrawals   Procedures:  Bedside i/d of abscess 7/22 by Urology (Dr. Alinda Money)  Consultations:  Urology  Discharge Exam: Filed Vitals:   11/03/13 1300 11/03/13 2121  11/04/13 0505 11/04/13 0920  BP: 145/102 137/96 136/92 127/88  Pulse: 88 83 92   Temp: 98.8 F (37.1 C) 98.5 F (36.9 C) 98.1 F (36.7 C)   TempSrc: Oral Oral Oral   Resp: 18 18 18    Height:      Weight:   99.519 kg (219 lb 6.4 oz)   SpO2: 98% 96% 93%     General: awake, in nad Cardiovascular: regular, s1, s2 Respiratory: normal resp effort, no wheezing  Discharge Instructions     Medication List         aspirin 81 MG EC tablet  Take 1 tablet (81 mg total) by mouth daily.     carvedilol 12.5 MG tablet  Commonly known as:  COREG  Take 1 tablet (12.5 mg total) by mouth 2 (two) times daily with a meal.     CENTRUM ADULTS PO  Take 1 tablet by mouth daily.     clindamycin 150 MG capsule  Commonly known as:  CLEOCIN  Take 1 capsule (150 mg total) by mouth every 6 (six) hours.     HYDROcodone-acetaminophen 5-325 MG per tablet  Commonly known as:  NORCO/VICODIN  Take 1-2 tablets by mouth every 4 (four) hours as needed for moderate pain.     lisinopril 10 MG tablet  Commonly known as:  PRINIVIL,ZESTRIL  Take 1 tablet (10 mg total) by mouth daily.     metFORMIN 500 MG tablet  Commonly known as:  GLUCOPHAGE  Take 1 tablet (500 mg total) by mouth 2 (two) times daily with a meal.     saccharomyces boulardii 250 MG capsule  Commonly known as:  FLORASTOR  Take 1 capsule (250 mg total) by mouth 2 (two) times daily.     simvastatin 40 MG tablet  Commonly known as:  ZOCOR  Take 1 tablet (40 mg total) by mouth daily.     spironolactone 25 MG tablet  Commonly known as:  ALDACTONE  Take 1 tablet (25 mg total) by mouth daily.       No Known Allergies Follow-up Information   Follow up with Reader    . (Appointment Tuesday 11/06/13 at [facility])    Contact information:   [place] [phone]      Follow up with BORDEN,LES, MD. Schedule an appointment as soon as possible for a visit in 2 weeks.    Specialty:  Urology   Contact information:   [place] [address] [phone]        The results of significant diagnostics from this hospitalization (including imaging, microbiology, ancillary and laboratory) are listed below for reference.    Significant Diagnostic Studies: Ct Pelvis W Contrast  10/31/2013   CLINICAL DATA:  Scrotal pain and swelling for 2 days  EXAM: CT PELVIS WITH CONTRAST  TECHNIQUE: Multidetector CT imaging of the pelvis was performed using the standard protocol following the bolus administration of intravenous contrast.  CONTRAST:  159mL OMNIPAQUE IOHEXOL 300 MG/ML  SOLN  COMPARISON:  None.  FINDINGS: There is diffuse edema of the scrotum. A midline scrotal hypodensity surrounded by increased density measures 4 x 2 cm and may reflect a scrotal abscess. There is no gas within the soft tissues of the scrotum or extending into the perineal region. The inflammatory changes do not appear to extend superiorly along the inguinal canals into the pelvis. There are enlarged inguinal lymph nodes bilaterally.  Separate from the scrotum along the posterior aspect of the buttocks there is skin thickening and increased fat density on the left. Within the pelvic cavity no abnormal fluid collections or inflammatory changes are demonstrated. The bony structures are unremarkable.  IMPRESSION: 1. The findings are consistent with a scrotal abscess and diffuse scrotal wall edema. No gas collections are demonstrated. There are enlarged inguinal lymph nodes bilaterally. 2. There is change consistent with a cellulitis involving the gluteal regions posteriorly, separate from the scrotal base. 3. There is no extension of inflammatory changes into the pelvic cavity.   Electronically Signed   By: David  Martinique   On: 10/31/2013 12:43    Microbiology: Recent Results (from the past 240 hour(s))  CULTURE, BLOOD (ROUTINE X 2)     Status: None   Collection Time    10/31/13 11:43 AM       Result Value Ref Range Status   Specimen Description BLOOD LEFT ARM   Final   Special Requests BOTTLES DRAWN AEROBIC AND ANAEROBIC [facility]   Final   Culture  Setup Time     Final   Value: 10/31/2013 14:01     Performed at [facility]   Culture     Final   Value:        BLOOD CULTURE RECEIVED NO GROWTH TO DATE CULTURE WILL BE HELD FOR 5 DAYS BEFORE ISSUING A FINAL NEGATIVE REPORT     Performed at [facility]   Report Status PENDING   Incomplete  CULTURE, BLOOD (ROUTINE X 2)     Status: None   Collection Time    10/31/13 11:45 AM      Result Value Ref Range Status   Specimen Description BLOOD BLOOD RIGHT FOREARM   Final   Special Requests BOTTLES DRAWN AEROBIC AND ANAEROBIC 6CC   Final   Culture  Setup Time     Final   Value: 10/31/2013 14:01     Performed at [facility]   Culture     Final   Value:        BLOOD CULTURE RECEIVED NO GROWTH TO DATE CULTURE WILL BE HELD FOR 5 DAYS BEFORE ISSUING A FINAL NEGATIVE REPORT     Performed at [facility]   Report Status PENDING   Incomplete  WOUND CULTURE     Status: None   Collection Time    10/31/13  5:56 PM      Result Value Ref Range Status   Specimen Description WOUND TESTICLE LEFT   Final   Special Requests NONE   Final   Gram Stain     Final   Value: MODERATE WBC PRESENT,BOTH PMN AND MONONUCLEAR     NO SQUAMOUS EPITHELIAL CELLS SEEN     NO ORGANISMS SEEN     Performed at [facility]   Culture     Final   Value: NO GROWTH 2 DAYS     Performed at [facility]   Report Status 11/02/2013 FINAL   Final     Labs: Basic Metabolic Panel:  Recent Labs Lab 10/31/13 1136 11/01/13 0500 11/02/13 0532  NA 135* 136* 134*  K 4.7 4.8 4.7  CL 102 100 96  CO2  --  24 24  GLUCOSE 136* 156* 189*  BUN 19 19 16   CREATININE 0.90 0.86 0.88  CALCIUM  --  8.9 9.0   Liver Function Tests:  Recent Labs Lab 11/02/13 0532  AST 9  ALT 8  ALKPHOS 111  BILITOT 0.3  PROT 7.5  ALBUMIN 2.4*    No results found for this basename: LIPASE, AMYLASE,  in the last 168 hours No results found for this basename: AMMONIA,  in the last 168 hours CBC:  Recent Labs Lab 10/31/13 1126 10/31/13 1136 11/01/13 0500 11/02/13 0532 11/03/13 0917 11/04/13 0859  WBC 15.7*  --  12.9* 13.0* 13.9* 9.5  NEUTROABS 12.7*  --   --   --   --   --   HGB 12.9* 13.6 12.3* 12.4* 12.8* 13.0  HCT 37.7* 40.0 36.1* 36.3* 37.1* 37.5*  MCV 93.8  --  93.8 93.8 93.2 92.8  PLT 445*  --  411* 430* 469* 468*   Cardiac Enzymes: No results found for this basename: CKTOTAL, CKMB, CKMBINDEX, TROPONINI,  in the last 168 hours BNP: BNP (last 3 results)  Recent Labs  09/22/13 0344  PROBNP 1845.0*   CBG:  Recent Labs Lab 11/03/13 0803 11/03/13 1138 11/03/13 1700 11/03/13 2119 11/04/13 0754  GLUCAP 102* 155* 160* 178* 163*    Signed:  CHIU, STEPHEN K  [facility] 11/04/2013, 11:32 AM

## 2013-11-06 LAB — CULTURE, BLOOD (ROUTINE X 2)
CULTURE: NO GROWTH
Culture: NO GROWTH

## 2013-12-05 ENCOUNTER — Ambulatory Visit: Payer: Self-pay

## 2014-02-15 ENCOUNTER — Emergency Department (HOSPITAL_COMMUNITY): Payer: 59

## 2014-02-15 ENCOUNTER — Encounter (HOSPITAL_COMMUNITY): Payer: Self-pay | Admitting: Emergency Medicine

## 2014-02-15 ENCOUNTER — Inpatient Hospital Stay (HOSPITAL_COMMUNITY)
Admission: EM | Admit: 2014-02-15 | Discharge: 2014-02-20 | DRG: 982 | Disposition: A | Discharge status: Home Health | Payer: 59 | Attending: Internal Medicine | Admitting: Internal Medicine

## 2014-02-15 DIAGNOSIS — L02215 Cutaneous abscess of perineum: Secondary | ICD-10-CM | POA: Diagnosis present

## 2014-02-15 DIAGNOSIS — K610 Anal abscess: Secondary | ICD-10-CM | POA: Diagnosis present

## 2014-02-15 DIAGNOSIS — N5082 Scrotal pain: Secondary | ICD-10-CM

## 2014-02-15 DIAGNOSIS — J449 Chronic obstructive pulmonary disease, unspecified: Secondary | ICD-10-CM | POA: Diagnosis present

## 2014-02-15 DIAGNOSIS — F17210 Nicotine dependence, cigarettes, uncomplicated: Secondary | ICD-10-CM | POA: Diagnosis present

## 2014-02-15 DIAGNOSIS — E669 Obesity, unspecified: Secondary | ICD-10-CM | POA: Diagnosis present

## 2014-02-15 DIAGNOSIS — I429 Cardiomyopathy, unspecified: Secondary | ICD-10-CM | POA: Diagnosis present

## 2014-02-15 DIAGNOSIS — E1165 Type 2 diabetes mellitus with hyperglycemia: Secondary | ICD-10-CM | POA: Diagnosis present

## 2014-02-15 DIAGNOSIS — I10 Essential (primary) hypertension: Secondary | ICD-10-CM | POA: Diagnosis present

## 2014-02-15 DIAGNOSIS — N492 Inflammatory disorders of scrotum: Secondary | ICD-10-CM | POA: Diagnosis present

## 2014-02-15 DIAGNOSIS — Z720 Tobacco use: Secondary | ICD-10-CM

## 2014-02-15 DIAGNOSIS — I5022 Chronic systolic (congestive) heart failure: Secondary | ICD-10-CM | POA: Diagnosis present

## 2014-02-15 DIAGNOSIS — Z6830 Body mass index (BMI) 30.0-30.9, adult: Secondary | ICD-10-CM

## 2014-02-15 DIAGNOSIS — F1010 Alcohol abuse, uncomplicated: Secondary | ICD-10-CM | POA: Diagnosis present

## 2014-02-15 DIAGNOSIS — N508 Other specified disorders of male genital organs: Secondary | ICD-10-CM | POA: Diagnosis present

## 2014-02-15 DIAGNOSIS — E785 Hyperlipidemia, unspecified: Secondary | ICD-10-CM | POA: Diagnosis present

## 2014-02-15 DIAGNOSIS — N289 Disorder of kidney and ureter, unspecified: Secondary | ICD-10-CM | POA: Diagnosis not present

## 2014-02-15 DIAGNOSIS — IMO0002 Reserved for concepts with insufficient information to code with codable children: Secondary | ICD-10-CM | POA: Diagnosis present

## 2014-02-15 DIAGNOSIS — N5089 Other specified disorders of the male genital organs: Secondary | ICD-10-CM

## 2014-02-15 DIAGNOSIS — L732 Hidradenitis suppurativa: Secondary | ICD-10-CM | POA: Diagnosis present

## 2014-02-15 DIAGNOSIS — Z79899 Other long term (current) drug therapy: Secondary | ICD-10-CM

## 2014-02-15 DIAGNOSIS — Z7982 Long term (current) use of aspirin: Secondary | ICD-10-CM

## 2014-02-15 DIAGNOSIS — N499 Inflammatory disorder of unspecified male genital organ: Secondary | ICD-10-CM

## 2014-02-15 LAB — GLUCOSE, CAPILLARY
GLUCOSE-CAPILLARY: 55 mg/dL — AB (ref 70–99)
GLUCOSE-CAPILLARY: 188 mg/dL — AB (ref 70–99)
Glucose-Capillary: 77 mg/dL (ref 70–99)

## 2014-02-15 LAB — URINE MICROSCOPIC-ADD ON

## 2014-02-15 LAB — URINALYSIS, ROUTINE W REFLEX MICROSCOPIC
Bilirubin Urine: NEGATIVE
GLUCOSE, UA: NEGATIVE mg/dL
Ketones, ur: NEGATIVE mg/dL
Nitrite: NEGATIVE
PROTEIN: 100 mg/dL — AB
SPECIFIC GRAVITY, URINE: 1.01 (ref 1.005–1.030)
UROBILINOGEN UA: 0.2 mg/dL (ref 0.0–1.0)
pH: 5 (ref 5.0–8.0)

## 2014-02-15 LAB — COMPREHENSIVE METABOLIC PANEL
ALT: 11 U/L (ref 0–53)
AST: 13 U/L (ref 0–37)
Albumin: 2.4 g/dL — ABNORMAL LOW (ref 3.5–5.2)
Alkaline Phosphatase: 128 U/L — ABNORMAL HIGH (ref 39–117)
Anion gap: 17 — ABNORMAL HIGH (ref 5–15)
BUN: 15 mg/dL (ref 6–23)
CALCIUM: 8.7 mg/dL (ref 8.4–10.5)
CO2: 21 meq/L (ref 19–32)
CREATININE: 1.08 mg/dL (ref 0.50–1.35)
Chloride: 93 mEq/L — ABNORMAL LOW (ref 96–112)
GFR calc Af Amer: >90 mL/min (ref >90)
GFR, EST NON AFRICAN AMERICAN: 81 mL/min — AB (ref >90)
Glucose, Bld: 67 mg/dL — ABNORMAL LOW (ref 70–99)
Potassium: 4.3 mEq/L (ref 3.7–5.3)
Sodium: 131 mEq/L — ABNORMAL LOW (ref 137–147)
Total Bilirubin: 0.4 mg/dL (ref 0.3–1.2)
Total Protein: 8.2 g/dL (ref 6.0–8.3)

## 2014-02-15 LAB — CBC WITH DIFFERENTIAL/PLATELET
BASOS PCT: 0 % (ref 0–1)
Basophils Absolute: 0 10*3/uL (ref 0.0–0.1)
Eosinophils Absolute: 0.4 10*3/uL (ref 0.0–0.7)
Eosinophils Relative: 3 % (ref 0–5)
HEMATOCRIT: 33.5 % — AB (ref 39.0–52.0)
Hemoglobin: 11.4 g/dL — ABNORMAL LOW (ref 13.0–17.0)
LYMPHS PCT: 9 % — AB (ref 12–46)
Lymphs Abs: 1.2 10*3/uL (ref 0.7–4.0)
MCH: 30.2 pg (ref 26.0–34.0)
MCHC: 34 g/dL (ref 30.0–36.0)
MCV: 88.6 fL (ref 78.0–100.0)
MONO ABS: 0.7 10*3/uL (ref 0.1–1.0)
Monocytes Relative: 5 % (ref 3–12)
Neutro Abs: 10.9 10*3/uL — ABNORMAL HIGH (ref 1.7–7.7)
Neutrophils Relative %: 83 % — ABNORMAL HIGH (ref 43–77)
Platelets: 440 10*3/uL — ABNORMAL HIGH (ref 150–400)
RBC: 3.78 MIL/uL — ABNORMAL LOW (ref 4.22–5.81)
RDW: 14.7 % (ref 11.5–15.5)
WBC: 13.2 10*3/uL — ABNORMAL HIGH (ref 4.0–10.5)

## 2014-02-15 LAB — I-STAT CG4 LACTIC ACID, ED: LACTIC ACID, VENOUS: 0.48 mmol/L — AB (ref 0.5–2.2)

## 2014-02-15 MED ORDER — INSULIN ASPART 100 UNIT/ML ~~LOC~~ SOLN
0.0000 [IU] | Freq: Three times a day (TID) | SUBCUTANEOUS | Status: DC
  Administered 2014-02-16 (×2): 3 [IU] via SUBCUTANEOUS
  Administered 2014-02-17 (×2): 2 [IU] via SUBCUTANEOUS
  Administered 2014-02-17 – 2014-02-18 (×2): 3 [IU] via SUBCUTANEOUS

## 2014-02-15 MED ORDER — HYDROMORPHONE HCL 1 MG/ML IJ SOLN
1.0000 mg | Freq: Once | INTRAMUSCULAR | Status: AC
  Administered 2014-02-15: 1 mg via INTRAVENOUS
  Filled 2014-02-15: qty 1

## 2014-02-15 MED ORDER — FOLIC ACID 1 MG PO TABS
1.0000 mg | ORAL_TABLET | Freq: Every day | ORAL | Status: DC
  Administered 2014-02-16 – 2014-02-20 (×5): 1 mg via ORAL
  Filled 2014-02-15 (×5): qty 1

## 2014-02-15 MED ORDER — DEXTROSE 50 % IV SOLN
25.0000 mL | Freq: Once | INTRAVENOUS | Status: AC | PRN
  Administered 2014-02-15: 25 mL via INTRAVENOUS
  Filled 2014-02-15: qty 50

## 2014-02-15 MED ORDER — SPIRONOLACTONE 25 MG PO TABS
25.0000 mg | ORAL_TABLET | Freq: Every day | ORAL | Status: DC
  Administered 2014-02-16 – 2014-02-20 (×5): 25 mg via ORAL
  Filled 2014-02-15 (×6): qty 1

## 2014-02-15 MED ORDER — IOHEXOL 300 MG/ML  SOLN
100.0000 mL | Freq: Once | INTRAMUSCULAR | Status: AC | PRN
Start: 2014-02-15 — End: 2014-02-15
  Administered 2014-02-15: 100 mL via INTRAVENOUS

## 2014-02-15 MED ORDER — ADULT MULTIVITAMIN W/MINERALS CH
1.0000 | ORAL_TABLET | Freq: Every day | ORAL | Status: DC
  Administered 2014-02-16 – 2014-02-20 (×5): 1 via ORAL
  Filled 2014-02-15 (×5): qty 1

## 2014-02-15 MED ORDER — HYDROMORPHONE HCL 1 MG/ML IJ SOLN
1.0000 mg | INTRAMUSCULAR | Status: AC | PRN
  Administered 2014-02-15 – 2014-02-16 (×6): 1 mg via INTRAVENOUS
  Filled 2014-02-15 (×6): qty 1

## 2014-02-15 MED ORDER — LORAZEPAM 2 MG/ML IJ SOLN: 1.0000 mg | Freq: Four times a day (QID) | INTRAMUSCULAR | Status: AC | PRN

## 2014-02-15 MED ORDER — ONDANSETRON HCL 4 MG PO TABS: 4.0000 mg | ORAL_TABLET | Freq: Four times a day (QID) | ORAL | Status: DC | PRN

## 2014-02-15 MED ORDER — SIMVASTATIN 40 MG PO TABS
40.0000 mg | ORAL_TABLET | Freq: Every day | ORAL | Status: DC
  Administered 2014-02-16 – 2014-02-20 (×5): 40 mg via ORAL
  Filled 2014-02-15 (×6): qty 1

## 2014-02-15 MED ORDER — INFLUENZA VAC SPLIT QUAD 0.5 ML IM SUSY
0.5000 mL | PREFILLED_SYRINGE | INTRAMUSCULAR | Status: AC
  Administered 2014-02-16: 0.5 mL via INTRAMUSCULAR
  Filled 2014-02-15: qty 0.5

## 2014-02-15 MED ORDER — ASPIRIN EC 81 MG PO TBEC
81.0000 mg | DELAYED_RELEASE_TABLET | Freq: Every day | ORAL | Status: DC
  Administered 2014-02-16 – 2014-02-20 (×5): 81 mg via ORAL
  Filled 2014-02-15 (×6): qty 1

## 2014-02-15 MED ORDER — PIPERACILLIN-TAZOBACTAM 3.375 G IVPB 30 MIN
3.3750 g | Freq: Once | INTRAVENOUS | Status: AC
  Administered 2014-02-15: 3.375 g via INTRAVENOUS
  Filled 2014-02-15: qty 50

## 2014-02-15 MED ORDER — PIPERACILLIN-TAZOBACTAM 3.375 G IVPB
3.3750 g | Freq: Three times a day (TID) | INTRAVENOUS | Status: DC
  Administered 2014-02-15 – 2014-02-20 (×15): 3.375 g via INTRAVENOUS
  Filled 2014-02-15 (×16): qty 50

## 2014-02-15 MED ORDER — PIPERACILLIN-TAZOBACTAM 3.375 G IVPB: 3.3750 g | Freq: Once | INTRAVENOUS | Status: DC

## 2014-02-15 MED ORDER — ONDANSETRON HCL 4 MG/2ML IJ SOLN: 4.0000 mg | Freq: Four times a day (QID) | INTRAMUSCULAR | Status: DC | PRN

## 2014-02-15 MED ORDER — ACETAMINOPHEN 325 MG PO TABS
650.0000 mg | ORAL_TABLET | Freq: Four times a day (QID) | ORAL | Status: DC | PRN
  Administered 2014-02-15: 650 mg via ORAL
  Filled 2014-02-15: qty 2

## 2014-02-15 MED ORDER — ENOXAPARIN SODIUM 60 MG/0.6ML ~~LOC~~ SOLN
50.0000 mg | SUBCUTANEOUS | Status: DC
  Administered 2014-02-15 – 2014-02-19 (×5): 50 mg via SUBCUTANEOUS
  Filled 2014-02-15 (×6): qty 0.6

## 2014-02-15 MED ORDER — ACETAMINOPHEN 650 MG RE SUPP: 650.0000 mg | Freq: Four times a day (QID) | RECTAL | Status: DC | PRN

## 2014-02-15 MED ORDER — CARVEDILOL 25 MG PO TABS
25.0000 mg | ORAL_TABLET | Freq: Two times a day (BID) | ORAL | Status: DC
  Administered 2014-02-16 – 2014-02-20 (×9): 25 mg via ORAL
  Filled 2014-02-15 (×13): qty 1

## 2014-02-15 MED ORDER — SODIUM CHLORIDE 0.9 % IV SOLN
INTRAVENOUS | Status: DC
  Administered 2014-02-15 – 2014-02-19 (×5): via INTRAVENOUS

## 2014-02-15 MED ORDER — VANCOMYCIN HCL IN DEXTROSE 750-5 MG/150ML-% IV SOLN
750.0000 mg | Freq: Three times a day (TID) | INTRAVENOUS | Status: DC
  Administered 2014-02-15 – 2014-02-18 (×11): 750 mg via INTRAVENOUS
  Filled 2014-02-15 (×15): qty 150

## 2014-02-15 MED ORDER — LORAZEPAM 1 MG PO TABS: 1.0000 mg | ORAL_TABLET | Freq: Four times a day (QID) | ORAL | Status: AC | PRN

## 2014-02-15 MED ORDER — VITAMIN B-1 100 MG PO TABS
100.0000 mg | ORAL_TABLET | Freq: Every day | ORAL | Status: DC
Start: 2014-02-16 — End: 2014-02-20
  Administered 2014-02-17 – 2014-02-20 (×4): 100 mg via ORAL
  Filled 2014-02-15 (×5): qty 1

## 2014-02-15 MED ORDER — THIAMINE HCL 100 MG/ML IJ SOLN
100.0000 mg | Freq: Every day | INTRAMUSCULAR | Status: DC
  Administered 2014-02-16: 100 mg via INTRAVENOUS
  Filled 2014-02-15 (×3): qty 1

## 2014-02-15 MED ORDER — LISINOPRIL 20 MG PO TABS
20.0000 mg | ORAL_TABLET | Freq: Every day | ORAL | Status: DC
  Administered 2014-02-16 – 2014-02-18 (×3): 20 mg via ORAL
  Filled 2014-02-15 (×4): qty 1

## 2014-02-15 MED ORDER — ENOXAPARIN SODIUM 40 MG/0.4ML ~~LOC~~ SOLN: 40.0000 mg | SUBCUTANEOUS | Status: DC

## 2014-02-15 NOTE — ED Provider Notes (Signed)
CSN: [id number]     Arrival date & time 02/15/14  0236 History   First MD Initiated Contact with Patient 02/15/14 0602     Chief Complaint  Patient presents with  . Testicle Pain     (Consider location/radiation/quality/duration/timing/severity/associated sxs/prior Treatment) HPI Comments: Patient with a history of DM, Scrotal Abscess, and Abscess of the Perineum presents today with bilateral scrotal swelling.  He reports that the swelling has been present for the past 3 days and is gradually worsening.  He has associated pain.  He has not taken anything for his symptoms.  He denies fever, chills, abdominal pain, nausea, vomiting, constipation, or diarrhea.  He denies penile discharge, dysuria, increased urinary frequency, or urgency. He was admitted for similar presentation approximately 4 months ago.     The history is provided by the patient.    Past Medical History  Diagnosis Date  . Bronchitis   . Croup   . Tobacco abuse   . Dizziness   . Abscess of perineum   . Heartburn   . Indigestion   . H/O folliculitis   . 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 Alcohol abuse   . Hypertension     variable in nature  . Hyperlipidemia   . Multiple excoriations     multifactorial in origin  . Mild obesity   . Dietary noncompliance     History of,  . Situational stress     secondary to financial issues  . Erectile dysfunction     Multifactorial. Improved with Cialis. New onset.  . Cystic acne     with acne keloidosis  . Diabetes mellitus without complication     Type 2, uncontrolled with questionable improvement  . 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 EKG, abnormal     History of, with negative cardiac evaluation by history.  [name] COPD (chronic obstructive pulmonary disease)     moderate obstruction with low vital capacity  . Asthma     History of,   History reviewed. No pertinent past surgical history. Family History  Problem Relation  Age of Onset  . Diabetes type II Mother   . Hypertension Mother    History  Substance Use Topics  . Smoking status: Current Every Day Smoker -- 1.00 packs/day for 20 years    Types: Cigarettes  . Smokeless tobacco: Not on file  . Alcohol Use: 7.2 oz/week    2 Shots of liquor, 10 Cans of beer per week    Review of Systems  All other systems reviewed and are negative.     Allergies  Review of patient's allergies indicates no known allergies.  Home Medications   Prior to Admission medications   Medication Sig Start Date End Date Taking? Authorizing Provider  aspirin EC 81 MG EC tablet Take 1 tablet (81 mg total) by mouth daily. 09/23/13  Yes Ejiroghene Arlyce Dice, MD  carvedilol (COREG) 25 MG tablet Take 25 mg by mouth 2 (two) times daily with a meal.   Yes Historical Provider, MD  lisinopril (PRINIVIL,ZESTRIL) 20 MG tablet Take 20 mg by mouth daily.   Yes Historical Provider, MD  metFORMIN (GLUCOPHAGE) 500 MG tablet Take 1 tablet (500 mg total) by mouth 2 (two) times daily with a meal. Patient taking differently: Take 1,000 mg by mouth 2 (two) times daily with a meal.  09/23/13  Yes Ejiroghene Arlyce Dice, MD  Multiple Vitamin (MULTIVITAMIN WITH MINERALS) TABS tablet  Take 1 tablet by mouth daily.   Yes Historical Provider, MD  simvastatin (ZOCOR) 40 MG tablet Take 1 tablet (40 mg total) by mouth daily. 09/23/13  Yes Ejiroghene Arlyce Dice, MD  spironolactone (ALDACTONE) 25 MG tablet Take 1 tablet (25 mg total) by mouth daily. 09/23/13  Yes Ejiroghene Arlyce Dice, MD  carvedilol (COREG) 12.5 MG tablet Take 1 tablet (12.5 mg total) by mouth 2 (two) times daily with a meal. 09/23/13   Ejiroghene E Emokpae, MD  clindamycin (CLEOCIN) 150 MG capsule Take 1 capsule (150 mg total) by mouth every 6 (six) hours. 11/04/13   Donne Hazel, MD  HYDROcodone-acetaminophen (NORCO/VICODIN) 5-325 MG per tablet Take 1-2 tablets by mouth every 4 (four) hours as needed for moderate pain. 11/04/13   Donne Hazel,  MD  lisinopril (PRINIVIL,ZESTRIL) 10 MG tablet Take 1 tablet (10 mg total) by mouth daily. 09/23/13   Ejiroghene Arlyce Dice, MD  Multiple Vitamins-Minerals (CENTRUM ADULTS PO) Take 1 tablet by mouth daily.    Historical Provider, MD  saccharomyces boulardii (FLORASTOR) 250 MG capsule Take 1 capsule (250 mg total) by mouth 2 (two) times daily. 11/04/13   Donne Hazel, MD   BP 125/87 mmHg  Pulse 99  Temp(Src) 99.9 F (37.7 C) (Oral)  Resp 18  Ht 5\' 11"  (1.803 m)  Wt 220 lb (99.791 kg)  BMI 30.70 kg/m2  SpO2 98% Physical Exam  Constitutional: He appears well-developed and well-nourished.  HENT:  Head: Normocephalic and atraumatic.  Mouth/Throat: Oropharynx is clear and moist.  Neck: Normal range of motion. Neck supple.  Cardiovascular: Normal rate, regular rhythm and normal heart sounds.   Pulmonary/Chest: Effort normal and breath sounds normal.  Abdominal: Soft. Bowel sounds are normal. He exhibits no distension and no mass. There is no tenderness. There is no rebound and no guarding.  Genitourinary:     Significant swelling and induration of the scrotum bilaterally.  Scrotum is tender to palpation bilaterally.  Small area of fluctuance at the posterior aspect.  2x1 cm abscess of the right buttock.  No active drainage.    Musculoskeletal: Normal range of motion.  Neurological: He is alert.  Skin: Skin is warm and dry.  Psychiatric: He has a normal mood and affect.  Nursing note and vitals reviewed.   ED Course  Procedures (including critical care time) Labs Review Labs Reviewed  CBC WITH DIFFERENTIAL - Abnormal; Notable for the following:    WBC 13.2 (*)    RBC 3.78 (*)    Hemoglobin 11.4 (*)    HCT 33.5 (*)    Platelets 440 (*)    Neutrophils Relative % 83 (*)    Neutro Abs 10.9 (*)    Lymphocytes Relative 9 (*)    All other components within normal limits  COMPREHENSIVE METABOLIC PANEL - Abnormal; Notable for the following:    Sodium 131 (*)    Chloride 93 (*)     Glucose, Bld 67 (*)    Albumin 2.4 (*)    Alkaline Phosphatase 128 (*)    GFR calc non Af Amer 81 (*)    Anion gap 17 (*)    All other components within normal limits  URINALYSIS, ROUTINE W REFLEX MICROSCOPIC - Abnormal; Notable for the following:    APPearance HAZY (*)    Hgb urine dipstick TRACE (*)    Protein, ur 100 (*)    Leukocytes, UA TRACE (*)    All other components within normal limits  URINE MICROSCOPIC-ADD ON -  Abnormal; Notable for the following:    Casts GRANULAR CAST (*)    All other components within normal limits  I-STAT CG4 LACTIC ACID, ED - Abnormal; Notable for the following:    Lactic Acid, Venous 0.48 (*)    All other components within normal limits  CULTURE, BLOOD (ROUTINE X 2)  CULTURE, BLOOD (ROUTINE X 2)    Imaging Review No results found.   EKG Interpretation None      9:00 AM Discussed with Dr. Gaynelle Arabian with Urology who recommended consult with Surgery.  He reports that Urology will come see the patient in the ED.  He also recommends admission to Hospitalist.  9:37 AM Discussed with General Surgery.  They report that they will come see the patient in the ED.  MDM   Final diagnoses:  Scrotal swelling  Scrotal pain   Patient with a history of DM and Scrotal Abscess presents today with scrotal swelling.  On exam the patient the scrotum is significantly tender, indurated, and swollen.  There is also a abscess of the buttock area.  CT scan ordered, which shows an abscess of the gluteal cleft and the scrotum.  Both Urology and General Surgery consulted and evaluated the patient.  Patient admitted to [place], PA-C 02/17/14 [id number]  Julianne Rice, MD 02/18/14 2326

## 2014-02-15 NOTE — Consult Note (Signed)
[facility] Surgery Consult Note  Austin Alexander 1970-07-22  [id number].    Requesting MD: Dr. Lita Mains Chief Complaint/Reason for Consult: gluteal/scrotal abscess  HPI:  43 y/o AA male with PMH DM/HTN/HLD, chronic folliculitis with h/o scrotal/perineal abscesses present to [facility] today with scrotal swelling. He reports that the swelling has been present for the past 3 days and is gradually worsening with pain, redness, and bloody purulent drainage. He denies known precipitating or alleviating factors.  No radiating pain.  He notes he has chronically scarred beard/neck from when he used to pull out his hair so he says "now I have a mosaic pattern on my face".  He says he has had problems with in-grown hairs/infections/pus drainage of the groin, buttock, perineum.  He says his scrotum is not usually this swollen/tender.  He says he has had his scrotum drained less than 1 year ago, but it came back in a similar spot.  He denies fever, chills, abdominal pain, nausea, vomiting, constipation, or diarrhea. He denies penile discharge, dysuria, increased urinary frequency, or urgency.   ROS: All systems reviewed and otherwise negative except for as above  Family History  Problem Relation Age of Onset  . Diabetes type II Mother   . Hypertension Mother     Past Medical History  Diagnosis Date  . Bronchitis   . Croup   . Tobacco abuse   . Dizziness   . Abscess of perineum   . Heartburn   . Indigestion   . H/O folliculitis   . 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 Alcohol abuse   . Hypertension     variable in nature  . Hyperlipidemia   . Multiple excoriations     multifactorial in origin  . Mild obesity   . Dietary noncompliance     History of,  . Situational stress     secondary to financial issues  . Erectile dysfunction     Multifactorial. Improved with Cialis. New onset.  . Cystic acne     with acne keloidosis  . Diabetes mellitus without complication      Type 2, uncontrolled with questionable improvement  . 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 EKG, abnormal     History of, with negative cardiac evaluation by history.  [name] COPD (chronic obstructive pulmonary disease)     moderate obstruction with low vital capacity  . Asthma     History of,    History reviewed. No pertinent past surgical history.  Social History:  reports that he has been smoking Cigarettes.  He has a 20 pack-year smoking history. He does not have any smokeless tobacco history on file. He reports that he drinks about 7.2 oz of alcohol per week. He reports that he does not use illicit drugs.  Allergies: No Known Allergies   (Not in a hospital admission)  Blood pressure 132/89, pulse 94, temperature 99.9 F (37.7 C), temperature source Oral, resp. rate 19, height $RemoveBe'5\' 11"'ctcQZIVMs$  (1.803 m), weight 220 lb (99.791 kg), SpO2 98 %. Physical Exam: General: pleasant, WD/WN AA male who is laying in bed in NAD, sweating HEENT: head is normocephalic, has a mosaic scared pattern to his face.  Sclera are noninjected.  PERRL.  Ears and nose without any masses or lesions.  Mouth is pink and moist.  Face is sweaty. Heart: regular, rate, and rhythm.  No obvious murmurs, gallops, or rubs noted.  Palpable pedal pulses bilaterally Lungs: CTAB,  no wheezes, rhonchi, or rales noted.  Respiratory effort nonlabored Abd: soft, NT/ND, +BS, no masses, hernias, or organomegaly Groin/buttock/perineum:  Scrotum is quite indurated and swollen, fluctuant area at the posterior aspect, quite tender to palpation.  B/l groin shows chronic scarring, skin thickening, and purulent-serosanguinous drainage with sinus tracts, buttock b/l is similar to groins.  He has a 2cm x 1cm fluctuant area on his right buttock near mid gluteal cleft which is quite tender and not currently draining MS: all 4 extremities are symmetrical with no cyanosis, clubbing, or edema. Skin: warm and dry with no masses,  lesions, or rashes Psych: A&Ox3 with an appropriate affect.   Results for orders placed or performed during the hospital encounter of 02/15/14 (from the past 48 hour(s))  CBC with Differential     Status: Abnormal   Collection Time: 02/15/14  2:45 AM  Result Value Ref Range   WBC 13.2 (H) 4.0 - 10.5 K/uL   RBC 3.78 (L) 4.22 - 5.81 MIL/uL   Hemoglobin 11.4 (L) 13.0 - 17.0 g/dL   HCT 33.5 (L) 39.0 - 52.0 %   MCV 88.6 78.0 - 100.0 fL   MCH 30.2 26.0 - 34.0 pg   MCHC 34.0 30.0 - 36.0 g/dL   RDW 14.7 11.5 - 15.5 %   Platelets 440 (H) 150 - 400 K/uL   Neutrophils Relative % 83 (H) 43 - 77 %   Neutro Abs 10.9 (H) 1.7 - 7.7 K/uL   Lymphocytes Relative 9 (L) 12 - 46 %   Lymphs Abs 1.2 0.7 - 4.0 K/uL   Monocytes Relative 5 3 - 12 %   Monocytes Absolute 0.7 0.1 - 1.0 K/uL   Eosinophils Relative 3 0 - 5 %   Eosinophils Absolute 0.4 0.0 - 0.7 K/uL   Basophils Relative 0 0 - 1 %   Basophils Absolute 0.0 0.0 - 0.1 K/uL  Comprehensive metabolic panel     Status: Abnormal   Collection Time: 02/15/14  2:45 AM  Result Value Ref Range   Sodium 131 (L) 137 - 147 mEq/L   Potassium 4.3 3.7 - 5.3 mEq/L   Chloride 93 (L) 96 - 112 mEq/L   CO2 21 19 - 32 mEq/L   Glucose, Bld 67 (L) 70 - 99 mg/dL   BUN 15 6 - 23 mg/dL   Creatinine, Ser 1.08 0.50 - 1.35 mg/dL   Calcium 8.7 8.4 - 10.5 mg/dL   Total Protein 8.2 6.0 - 8.3 g/dL   Albumin 2.4 (L) 3.5 - 5.2 g/dL   AST 13 0 - 37 U/L   ALT 11 0 - 53 U/L   Alkaline Phosphatase 128 (H) 39 - 117 U/L   Total Bilirubin 0.4 0.3 - 1.2 mg/dL   GFR calc non Af Amer 81 (L) >90 mL/min   GFR calc Af Amer >90 >90 mL/min    Comment: (NOTE) The eGFR has been calculated using the CKD EPI equation. This calculation has not been validated in all clinical situations. eGFR's persistently <90 mL/min signify possible Chronic Kidney Disease.    Anion gap 17 (H) 5 - 15  Urinalysis, Routine w reflex microscopic     Status: Abnormal   Collection Time: 02/15/14  6:15 AM   Result Value Ref Range   Color, Urine YELLOW YELLOW   APPearance HAZY (A) CLEAR   Specific Gravity, Urine 1.010 1.005 - 1.030   pH 5.0 5.0 - 8.0   Glucose, UA NEGATIVE NEGATIVE mg/dL   Hgb urine dipstick TRACE (  A) NEGATIVE   Bilirubin Urine NEGATIVE NEGATIVE   Ketones, ur NEGATIVE NEGATIVE mg/dL   Protein, ur 100 (A) NEGATIVE mg/dL   Urobilinogen, UA 0.2 0.0 - 1.0 mg/dL   Nitrite NEGATIVE NEGATIVE   Leukocytes, UA TRACE (A) NEGATIVE  Urine microscopic-add on     Status: Abnormal   Collection Time: 02/15/14  6:15 AM  Result Value Ref Range   Squamous Epithelial / LPF RARE RARE   WBC, UA 21-50 <3 WBC/hpf   RBC / HPF 0-2 <3 RBC/hpf   Bacteria, UA RARE RARE   Casts GRANULAR CAST (A) NEGATIVE    Comment: HYALINE CASTS  I-Stat CG4 Lactic Acid, ED     Status: Abnormal   Collection Time: 02/15/14  7:03 AM  Result Value Ref Range   Lactic Acid, Venous 0.48 (L) 0.5 - 2.2 mmol/L   Ct Pelvis W Contrast  02/15/2014   CLINICAL DATA:  Bilateral testicular swelling and pain for 2 days. No known injury. No dysuria. No fever or chills.  EXAM: CT PELVIS WITH CONTRAST  TECHNIQUE: Multidetector CT imaging of the pelvis was performed using the standard protocol following the bolus administration of intravenous contrast.  CONTRAST:  156m OMNIPAQUE IOHEXOL 300 MG/ML SOLN 100 cc Omnipaque 300  COMPARISON:  10/31/2013  FINDINGS: There is diffuse scrotal wall edema bilaterally. Just to the left of midline along the inferior aspect of the scrotum there is an irregular hypoechoic collection with rim enhancement. This measures 1.6 x 4.4 x 4.9 cm and is slightly larger when compared with the prior study, suspicious for abscess. There is heterogeneous appearance along the inferior aspect of the left hemiscrotum, suspicious for additional complex fluid collection. There is more simple appearing fluid along the dependent aspect of the right hemiscrotum, possibly representing hydrocele versus edema.  There is bilateral  inguinal adenopathy, largest node on the right measuring 3.1 cm. Largest node on the left measures 2.2 cm. Dependent edema identified in the buttocks bilaterally. To the right of midline in the gluteal cleft there is question of skin edema, skin breakdown, or possible abscess. This heterogeneous collection measures 2.4 x 5.3 cm. Correlation with physical exam findings recommended.  Urinary bladder has a normal appearance. There are calcifications of the seminal vesicles. Visualized bowel loops are unremarkable in appearance. There are degenerative changes in the lower lumbar spine.  IMPRESSION: 1. Focal collection along the inferior aspect of the scrotum suspicious for abscess. As CT cannot assess integrity of the blood flow in the testicles, further characterization with scrotal ultrasound and Doppler recommended. 2. Hydrocele versus edema involving the dependent aspect of the right hemiscrotum. 3. Bilateral inguinal adenopathy. 4. Question right gluteal cleft abscess, skin thickening, or breakdown. 5. These results will be called to the ordering clinician or representative by the Radiologist Assistant, and communication documented in the PACS or zVision Dashboard.   Electronically Signed   By: BShon HaleM.D.   On: 02/15/2014 08:33      Assessment/Plan Hidradenitis of groin, scrotum, buttock, and perineum Chronic folliculitis Scrotal abscess/cellulitis ETOH abuse Tobacco abuse Multiple medical problems including DM, HTN, HLD  Plan: 1.  Admit to medicine, we weill consult.  Urology consulted by ED.  Will attempt to coordinate surgery with urology as both areas will need I&D  2.  NPO, bowel rest, IVF, pain control, antiemetics, antibiotics (Zosyn.Vanc Day #1 started by EDP) 3.  SCD's  4.  Discussed anatomy and pathophysiology with the patient in detail and discussed preventative and management at home  to prevent further worsening of his hidradenitis 5.  May need extensive excision at some point,  but too acute at this point   Coralie Keens, [facility] Surgery 02/15/2014, 11:20 AM Pager: [phone]

## 2014-02-15 NOTE — Plan of Care (Signed)
Problem: Phase I Progression Outcomes Goal: Voiding-avoid urinary catheter unless indicated Outcome: Completed/Met Date Met:  02/15/14     

## 2014-02-15 NOTE — ED Notes (Signed)
Pt. reports bilateral testicular swelling and pain onset 2 days ago , denies injury or dysuria , no fever or chills.

## 2014-02-15 NOTE — Progress Notes (Signed)
Admission note:  Arrival Method: wheelchair Mental Orientation: alert & oriented x 4  Telemetry: no ordered Assessment: completed Skin: testicular swelling and abscess areas draining serosanguinous fluid  IV: left FA Pain: pt denies pain Tubes: N/A Safety Measures: Patient Handbook has been given, and discussed the Fall Prevention worksheet. Left at bedside  Admission: Completed and admission orders have been written  6E Orientation: Patient has been oriented to the unit, staff and to the room.

## 2014-02-15 NOTE — H&P (Addendum)
PCP:   No PCP Per Patient   Chief Complaint:  Testicle pain  HPI:  43 year old male  has a past medical history of Bronchitis; Croup; Tobacco abuse; Dizziness; Abscess of perineum; Heartburn; Indigestion; H/O folliculitis; Herpes exposure; Alcohol abuse; Hypertension; Hyperlipidemia; Multiple excoriations; Mild obesity; Dietary noncompliance; Situational stress; Erectile dysfunction; Cystic acne; Diabetes mellitus without complication; Abscess; EKG, abnormal; COPD (chronic obstructive pulmonary disease); and Asthma. Today presents to the ED with 3 day history of testicular pain and scrotal swelling. Patient says that the pain and swelling has been gradually worsening. He denies any dysuria urgency or frequency of urination, no penile discharge. No fevers chills no nausea vomiting or diarrhea. In the ED CT scan pelvis with contrast was done, which showed focal collection along the inferior aspect of the scrotum suspicious for abscess. Also for hydrocele versus edema involving the dependent aspect of right hemiscrotum . Urology and surgery have been consulted by the ED physician. [facility] hospitalist called for admission due to multiple medical problems.  Allergies:  No Known Allergies    Past Medical History  Diagnosis Date  . Bronchitis   . Croup   . Tobacco abuse   . Dizziness   . Abscess of perineum   . Heartburn   . Indigestion   . H/O folliculitis   . 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 Alcohol abuse   . Hypertension     variable in nature  . Hyperlipidemia   . Multiple excoriations     multifactorial in origin  . Mild obesity   . Dietary noncompliance     History of,  . Situational stress     secondary to financial issues  . Erectile dysfunction     Multifactorial. Improved with Cialis. New onset.  . Cystic acne     with acne keloidosis  . Diabetes mellitus without complication     Type 2, uncontrolled with questionable improvement  . 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 EKG, abnormal     History of, with negative cardiac evaluation by history.  [name] COPD (chronic obstructive pulmonary disease)     moderate obstruction with low vital capacity  . Asthma     History of,    History reviewed. No pertinent past surgical history.  Prior to Admission medications   Medication Sig Start Date End Date Taking? Authorizing Provider  aspirin EC 81 MG EC tablet Take 1 tablet (81 mg total) by mouth daily. 09/23/13  Yes Ejiroghene Arlyce Dice, MD  carvedilol (COREG) 25 MG tablet Take 25 mg by mouth 2 (two) times daily with a meal.   Yes Historical Provider, MD  lisinopril (PRINIVIL,ZESTRIL) 20 MG tablet Take 20 mg by mouth daily.   Yes Historical Provider, MD  metFORMIN (GLUCOPHAGE) 500 MG tablet Take 1 tablet (500 mg total) by mouth 2 (two) times daily with a meal. Patient taking differently: Take 1,000 mg by mouth 2 (two) times daily with a meal.  09/23/13  Yes Ejiroghene E Emokpae, MD  Multiple Vitamin (MULTIVITAMIN WITH MINERALS) TABS tablet Take 1 tablet by mouth daily.   Yes Historical Provider, MD  simvastatin (ZOCOR) 40 MG tablet Take 1 tablet (40 mg total) by mouth daily. 09/23/13  Yes Ejiroghene Arlyce Dice, MD  spironolactone (ALDACTONE) 25 MG tablet Take 1 tablet (25 mg total) by mouth daily. 09/23/13  Yes Ejiroghene E Emokpae, MD  carvedilol (COREG) 12.5 MG tablet Take 1 tablet (12.5 mg total)  by mouth 2 (two) times daily with a meal. 09/23/13   Ejiroghene E Denton Brick, MD  clindamycin (CLEOCIN) 150 MG capsule Take 1 capsule (150 mg total) by mouth every 6 (six) hours. 11/04/13   Donne Hazel, MD  HYDROcodone-acetaminophen (NORCO/VICODIN) 5-325 MG per tablet Take 1-2 tablets by mouth every 4 (four) hours as needed for moderate pain. 11/04/13   Donne Hazel, MD  lisinopril (PRINIVIL,ZESTRIL) 10 MG tablet Take 1 tablet (10 mg total) by mouth daily. 09/23/13   Ejiroghene Arlyce Dice, MD  Multiple Vitamins-Minerals (CENTRUM ADULTS  PO) Take 1 tablet by mouth daily.    Historical Provider, MD  saccharomyces boulardii (FLORASTOR) 250 MG capsule Take 1 capsule (250 mg total) by mouth 2 (two) times daily. 11/04/13   Donne Hazel, MD    Social History:  reports that he has been smoking Cigarettes.  He has a 20 pack-year smoking history. He does not have any smokeless tobacco history on file. He reports that he drinks about 7.2 oz of alcohol per week. He reports that he does not use illicit drugs.  Family History  Problem Relation Age of Onset  . Diabetes type II Mother   . Hypertension Mother      All the positives are listed in BOLD  Review of Systems:  HEENT: Headache, blurred vision, runny nose, sore throat Neck: Hypothyroidism, hyperthyroidism,,lymphadenopathy Chest : Shortness of breath, history of COPD, Asthma Heart : Chest pain, history of coronary arterey disease GI:  Nausea, vomiting, diarrhea, constipation, GERD GU: Dysuria, urgency, frequency of urination, hematuria Neuro: Stroke, seizures, syncope Psych: Depression, anxiety, hallucinations   Physical Exam: Blood pressure 142/92, pulse 96, temperature 99.9 F (37.7 C), temperature source Oral, resp. rate 16, height 5\' 11"  (1.803 m), weight 99.791 kg (220 lb), SpO2 98 %. Constitutional:   Patient is a well-developed and well-nourished male* in no acute distress and cooperative with exam. Head: Normocephalic and atraumatic Mouth: Mucus membranes moist Eyes: PERRL, EOMI, conjunctivae normal Neck: Supple, No Thyromegaly Cardiovascular: RRR, S1 normal, S2 normal Pulmonary/Chest: CTAB, no wheezes, rales, or rhonchi Abdominal: Soft. Non-tender, non-distended, bowel sounds are normal, no masses, organomegaly, or guarding present.  Scrotum- lichenified skin, with areas of induration noted on the scrotal wall. Positive tenderness to palpation of testicles. No erythema noted Neurological: A&O x3, Strenght is normal and symmetric bilaterally, cranial nerve  II-XII are grossly intact, no focal motor deficit, sensory intact to light touch bilaterally.  Extremities : No Cyanosis, Clubbing or Edema  Labs on Admission:  Basic Metabolic Panel:  Recent Labs Lab 02/15/14 0245  NA 131*  K 4.3  CL 93*  CO2 21  GLUCOSE 67*  BUN 15  CREATININE 1.08  CALCIUM 8.7   Liver Function Tests:  Recent Labs Lab 02/15/14 0245  AST 13  ALT 11  ALKPHOS 128*  BILITOT 0.4  PROT 8.2  ALBUMIN 2.4*   No results for input(s): LIPASE, AMYLASE in the last 168 hours. No results for input(s): AMMONIA in the last 168 hours. CBC:  Recent Labs Lab 02/15/14 0245  WBC 13.2*  NEUTROABS 10.9*  HGB 11.4*  HCT 33.5*  MCV 88.6  PLT 440*   Cardiac Enzymes: No results for input(s): CKTOTAL, CKMB, CKMBINDEX, TROPONINI in the last 168 hours.  BNP (last 3 results)  Recent Labs  09/22/13 0344  PROBNP 1845.0*   CBG: No results for input(s): GLUCAP in the last 168 hours.  Radiological Exams on Admission: Ct Pelvis W Contrast  02/15/2014   CLINICAL  DATA:  Bilateral testicular swelling and pain for 2 days. No known injury. No dysuria. No fever or chills.  EXAM: CT PELVIS WITH CONTRAST  TECHNIQUE: Multidetector CT imaging of the pelvis was performed using the standard protocol following the bolus administration of intravenous contrast.  CONTRAST:  133mL OMNIPAQUE IOHEXOL 300 MG/ML SOLN 100 cc Omnipaque 300  COMPARISON:  10/31/2013  FINDINGS: There is diffuse scrotal wall edema bilaterally. Just to the left of midline along the inferior aspect of the scrotum there is an irregular hypoechoic collection with rim enhancement. This measures 1.6 x 4.4 x 4.9 cm and is slightly larger when compared with the prior study, suspicious for abscess. There is heterogeneous appearance along the inferior aspect of the left hemiscrotum, suspicious for additional complex fluid collection. There is more simple appearing fluid along the dependent aspect of the right hemiscrotum,  possibly representing hydrocele versus edema.  There is bilateral inguinal adenopathy, largest node on the right measuring 3.1 cm. Largest node on the left measures 2.2 cm. Dependent edema identified in the buttocks bilaterally. To the right of midline in the gluteal cleft there is question of skin edema, skin breakdown, or possible abscess. This heterogeneous collection measures 2.4 x 5.3 cm. Correlation with physical exam findings recommended.  Urinary bladder has a normal appearance. There are calcifications of the seminal vesicles. Visualized bowel loops are unremarkable in appearance. There are degenerative changes in the lower lumbar spine.  IMPRESSION: 1. Focal collection along the inferior aspect of the scrotum suspicious for abscess. As CT cannot assess integrity of the blood flow in the testicles, further characterization with scrotal ultrasound and Doppler recommended. 2. Hydrocele versus edema involving the dependent aspect of the right hemiscrotum. 3. Bilateral inguinal adenopathy. 4. Question right gluteal cleft abscess, skin thickening, or breakdown. 5. These results will be called to the ordering clinician or representative by the Radiologist Assistant, and communication documented in the PACS or zVision Dashboard.   Electronically Signed   By: Shon Hale M.D.   On: 02/15/2014 08:33     Assessment/Plan Active Problems:   HTN (hypertension)   Uncontrolled diabetes mellitus   Tobacco abuse   Scrotal wall abscess  Scrotal wall abscess Patient has been seen by surgery, recommended IV antibiotics.Surgery will be done when patient more stable. Will continue vancomycin and Zosyn per pharmacy consultation. Will obtain 2 sets of blood cultures.  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 Continue Coreg 25 mg twice a day.  Diabetes mellitus We'll start the patient on sliding-scale insulin with NovoLog Check CBG every before meals and  at bedtime.  Hypertension Blood pressure is controlled, Continue Zestril, Coreg, Spironolactone  History of alcohol abuse Patient drinks alcohol almost everyday, will initiate CIWA protocol.Also start thiamine and folate  DVT prophylaxis Lovenox  Code status:patient is full code  Family discussion: no family at bedside   Time Spent on Admission: 60 minutes  [facility] Pager: [phone] 02/15/2014, 12:17 PM  If 7PM-7AM, please contact night-coverage  [email or web address]  Password [email or web address]

## 2014-02-15 NOTE — Progress Notes (Addendum)
ANTIBIOTIC CONSULT NOTE - INITIAL  Pharmacy Consult for vancomycin Indication: scrotal abscess/cellulitis  No Known Allergies  Patient Measurements: Height: 5\' 11"  (180.3 cm) Weight: 220 lb (99.791 kg) IBW/kg (Calculated) : 75.3  Vital Signs: Temp: 99.9 F (37.7 C) (11/06 0241) Temp Source: Oral (11/06 0241) BP: 127/89 mmHg (11/06 0603) Pulse Rate: 102 (11/06 0603)  Labs:  Recent Labs  02/15/14 0245  WBC 13.2*  HGB 11.4*  PLT 440*  CREATININE 1.08   Estimated Creatinine Clearance: 104 mL/min (by C-G formula based on Cr of 1.08).   Medical History: Past Medical History  Diagnosis Date  . Bronchitis   . Croup   . Tobacco abuse   . Dizziness   . Abscess of perineum   . Heartburn   . Indigestion   . H/O folliculitis   . 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 Alcohol abuse   . Hypertension     variable in nature  . Hyperlipidemia   . Multiple excoriations     multifactorial in origin  . Mild obesity   . Dietary noncompliance     History of,  . Situational stress     secondary to financial issues  . Erectile dysfunction     Multifactorial. Improved with Cialis. New onset.  . Cystic acne     with acne keloidosis  . Diabetes mellitus without complication     Type 2, uncontrolled with questionable improvement  . 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 EKG, abnormal     History of, with negative cardiac evaluation by history.  [name] COPD (chronic obstructive pulmonary disease)     moderate obstruction with low vital capacity  . Asthma     History of,     Assessment: 43yo male c/o bilateral testicular swelling and pain x2d, to begin IV ABX.  Goal of Therapy:  Vancomycin trough level 10-15 mcg/ml  Plan:  Was previously slightly above goal with usual dose of vanc 1000mg  IV [id number] so will instead start vancomycin 750mg  IV Q8H and monitor CBC, Cx, levels prn.  Wynona Neat, PharmD, BCPS  02/15/2014,6:50 AM   Adding  Zosyn  Plan Zosyn 3.375 grams iv Q 8 hours Continue to follow  Thank you. Anette Guarneri, PharmD [phone]

## 2014-02-15 NOTE — Plan of Care (Signed)
Problem: Phase I Progression Outcomes Goal: OOB as tolerated unless otherwise ordered Outcome: Completed/Met Date Met:  02/15/14

## 2014-02-15 NOTE — ED Notes (Signed)
Pt not in room at this time

## 2014-02-15 NOTE — Consult Note (Signed)
Urology Consult   Physician requesting consult: Oswald Hillock, MD  Reason for consult: Scrotal Abscess  History of Present Illness: Austin Alexander is a 43 y.o. male with PMH DM/HTN/HLD, chronic folliculitis with h/o scrotal/perineal abscesses present to [facility] today with scrotal swelling. He reports that the swelling and pain started about three days ago and has been getting progressively worse.  In the ER he was found to have a draining abscess from his scrotum and inner buttock.  CT scan was performed which confirmed scrotal and gluteal abscesses.  He denies any prior surgeries.  He has had scrotal abscess drained in the past.    Past Medical History  Diagnosis Date  . Bronchitis   . Croup   . Tobacco abuse   . Dizziness   . Abscess of perineum   . Heartburn   . Indigestion   . H/O folliculitis   . 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 Alcohol abuse   . Hypertension     variable in nature  . Hyperlipidemia   . Multiple excoriations     multifactorial in origin  . Mild obesity   . Dietary noncompliance     History of,  . Situational stress     secondary to financial issues  . Erectile dysfunction     Multifactorial. Improved with Cialis. New onset.  . Cystic acne     with acne keloidosis  . Diabetes mellitus without complication     Type 2, uncontrolled with questionable improvement  . 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 EKG, abnormal     History of, with negative cardiac evaluation by history.  [name] COPD (chronic obstructive pulmonary disease)     moderate obstruction with low vital capacity  . Asthma     History of,    History reviewed. No pertinent past surgical history.  Medications:  Home meds:    Medication List    ASK your doctor about these medications        aspirin 81 MG EC tablet  Take 1 tablet (81 mg total) by mouth daily.     carvedilol 25 MG tablet  Commonly known as:  COREG  Take 25 mg by mouth 2 (two)  times daily with a meal.     carvedilol 12.5 MG tablet  Commonly known as:  COREG  Take 1 tablet (12.5 mg total) by mouth 2 (two) times daily with a meal.     CENTRUM ADULTS PO  Take 1 tablet by mouth daily.     clindamycin 150 MG capsule  Commonly known as:  CLEOCIN  Take 1 capsule (150 mg total) by mouth every 6 (six) hours.     HYDROcodone-acetaminophen 5-325 MG per tablet  Commonly known as:  NORCO/VICODIN  Take 1-2 tablets by mouth every 4 (four) hours as needed for moderate pain.     lisinopril 20 MG tablet  Commonly known as:  PRINIVIL,ZESTRIL  Take 20 mg by mouth daily.     lisinopril 10 MG tablet  Commonly known as:  PRINIVIL,ZESTRIL  Take 1 tablet (10 mg total) by mouth daily.     metFORMIN 500 MG tablet  Commonly known as:  GLUCOPHAGE  Take 1 tablet (500 mg total) by mouth 2 (two) times daily with a meal.     multivitamin with minerals Tabs tablet  Take 1 tablet by mouth daily.     saccharomyces boulardii 250 MG capsule  Commonly  known as:  FLORASTOR  Take 1 capsule (250 mg total) by mouth 2 (two) times daily.     simvastatin 40 MG tablet  Commonly known as:  ZOCOR  Take 1 tablet (40 mg total) by mouth daily.     spironolactone 25 MG tablet  Commonly known as:  ALDACTONE  Take 1 tablet (25 mg total) by mouth daily.        Scheduled Meds: . aspirin EC  81 mg Oral Daily  . carvedilol  25 mg Oral BID WC  . enoxaparin (LOVENOX) injection  50 mg Subcutaneous Q24H  . [START ON AB-[id number] folic acid  1 mg Oral Daily  . insulin aspart  0-9 Units Subcutaneous TID WC  . [START ON 02/16/2014] lisinopril  20 mg Oral Daily  . [START ON 02/16/2014] multivitamin with minerals  1 tablet Oral Daily  . piperacillin-tazobactam (ZOSYN)  IV  3.375 g Intravenous Q8H  . simvastatin  40 mg Oral Daily  . spironolactone  25 mg Oral Daily  . [START ON 02/16/2014] thiamine  100 mg Oral Daily   Or  . [START ON 02/16/2014] thiamine  100 mg Intravenous Daily  . vancomycin  750 mg  Intravenous 3 times per day   Continuous Infusions: . sodium chloride 75 mL/hr at 02/15/14 1357   PRN Meds:.acetaminophen **OR** acetaminophen, LORazepam **OR** LORazepam, ondansetron **OR** ondansetron (ZOFRAN) IV  Allergies: No Known Allergies  Family History  Problem Relation Age of Onset  . Diabetes type II Mother   . Hypertension Mother     Social History:  reports that he has been smoking Cigarettes.  He has a 20 pack-year smoking history. He does not have any smokeless tobacco history on file. He reports that he drinks about 7.2 oz of alcohol per week. He reports that he does not use illicit drugs.  ROS: A complete review of systems was performed.  All systems are negative except for pertinent findings as noted.  Physical Exam:  Vital signs in last 24 hours: Temp:  [99.1 F (37.3 C)-99.9 F (37.7 C)] 99.1 F (37.3 C) (11/06 1441) Pulse Rate:  [94-109] 100 (11/06 1441) Resp:  [16-23] 19 (11/06 1441) BP: (114-151)/(73-94) 151/93 mmHg (11/06 1441) SpO2:  [91 %-100 %] 98 % (11/06 1441) Weight:  [99.791 kg (220 lb)-100.971 kg (222 lb 9.6 oz)] 100.971 kg (222 lb 9.6 oz) (11/06 1441) Constitutional:  Alert and oriented, No acute distress Cardiovascular: Regular rate and rhythm, No JVD Respiratory: Normal respiratory effort, Lungs clear bilaterally GI: Abdomen is soft, nontender, nondistended, no abdominal masses Genitourinary: No CVAT. Normal male phallus, Scrotum is enlarged and tender with a draining sinus and area of fluctuance at the base.  Chronic skin changes consistent with hydradenitis. Lymphatic: No lymphadenopathy Neurologic: Grossly intact, no focal deficits Psychiatric: Normal mood and affect  Laboratory Data:   Recent Labs  02/15/14 0245  WBC 13.2*  HGB 11.4*  HCT 33.5*  PLT 440*     Recent Labs  02/15/14 0245  NA 131*  K 4.3  CL 93*  GLUCOSE 67*  BUN 15  CALCIUM 8.7  CREATININE 1.08     Results for orders placed or performed during the  hospital encounter of 02/15/14 (from the past 24 hour(s))  CBC with Differential     Status: Abnormal   Collection Time: 02/15/14  2:45 AM  Result Value Ref Range   WBC 13.2 (H) 4.0 - 10.5 K/uL   RBC 3.78 (L) 4.22 - 5.81 MIL/uL   Hemoglobin 11.4 (  L) 13.0 - 17.0 g/dL   HCT 33.5 (L) 39.0 - 52.0 %   MCV 88.6 78.0 - 100.0 fL   MCH 30.2 26.0 - 34.0 pg   MCHC 34.0 30.0 - 36.0 g/dL   RDW 14.7 11.5 - 15.5 %   Platelets 440 (H) 150 - 400 K/uL   Neutrophils Relative % 83 (H) 43 - 77 %   Neutro Abs 10.9 (H) 1.7 - 7.7 K/uL   Lymphocytes Relative 9 (L) 12 - 46 %   Lymphs Abs 1.2 0.7 - 4.0 K/uL   Monocytes Relative 5 3 - 12 %   Monocytes Absolute 0.7 0.1 - 1.0 K/uL   Eosinophils Relative 3 0 - 5 %   Eosinophils Absolute 0.4 0.0 - 0.7 K/uL   Basophils Relative 0 0 - 1 %   Basophils Absolute 0.0 0.0 - 0.1 K/uL  Comprehensive metabolic panel     Status: Abnormal   Collection Time: 02/15/14  2:45 AM  Result Value Ref Range   Sodium 131 (L) 137 - 147 mEq/L   Potassium 4.3 3.7 - 5.3 mEq/L   Chloride 93 (L) 96 - 112 mEq/L   CO2 21 19 - 32 mEq/L   Glucose, Bld 67 (L) 70 - 99 mg/dL   BUN 15 6 - 23 mg/dL   Creatinine, Ser 1.08 0.50 - 1.35 mg/dL   Calcium 8.7 8.4 - 10.5 mg/dL   Total Protein 8.2 6.0 - 8.3 g/dL   Albumin 2.4 (L) 3.5 - 5.2 g/dL   AST 13 0 - 37 U/L   ALT 11 0 - 53 U/L   Alkaline Phosphatase 128 (H) 39 - 117 U/L   Total Bilirubin 0.4 0.3 - 1.2 mg/dL   GFR calc non Af Amer 81 (L) >90 mL/min   GFR calc Af Amer >90 >90 mL/min   Anion gap 17 (H) 5 - 15  Urinalysis, Routine w reflex microscopic     Status: Abnormal   Collection Time: 02/15/14  6:15 AM  Result Value Ref Range   Color, Urine YELLOW YELLOW   APPearance HAZY (A) CLEAR   Specific Gravity, Urine 1.010 1.005 - 1.030   pH 5.0 5.0 - 8.0   Glucose, UA NEGATIVE NEGATIVE mg/dL   Hgb urine dipstick TRACE (A) NEGATIVE   Bilirubin Urine NEGATIVE NEGATIVE   Ketones, ur NEGATIVE NEGATIVE mg/dL   Protein, ur 100 (A) NEGATIVE mg/dL    Urobilinogen, UA 0.2 0.0 - 1.0 mg/dL   Nitrite NEGATIVE NEGATIVE   Leukocytes, UA TRACE (A) NEGATIVE  Urine microscopic-add on     Status: Abnormal   Collection Time: 02/15/14  6:15 AM  Result Value Ref Range   Squamous Epithelial / LPF RARE RARE   WBC, UA 21-50 <3 WBC/hpf   RBC / HPF 0-2 <3 RBC/hpf   Bacteria, UA RARE RARE   Casts GRANULAR CAST (A) NEGATIVE  I-Stat CG4 Lactic Acid, ED     Status: Abnormal   Collection Time: 02/15/14  7:03 AM  Result Value Ref Range   Lactic Acid, Venous 0.48 (L) 0.5 - 2.2 mmol/L   No results found for this or any previous visit (from the past 240 hour(s)).  Renal Function:  Recent Labs  02/15/14 0245  CREATININE 1.08   Estimated Creatinine Clearance: 104.6 mL/min (by C-G formula based on Cr of 1.08).  Radiologic Imaging: Ct Pelvis W Contrast  02/15/2014   CLINICAL DATA:  Bilateral testicular swelling and pain for 2 days. No known injury. No dysuria. No  fever or chills.  EXAM: CT PELVIS WITH CONTRAST  TECHNIQUE: Multidetector CT imaging of the pelvis was performed using the standard protocol following the bolus administration of intravenous contrast.  CONTRAST:  179mL OMNIPAQUE IOHEXOL 300 MG/ML SOLN 100 cc Omnipaque 300  COMPARISON:  10/31/2013  FINDINGS: There is diffuse scrotal wall edema bilaterally. Just to the left of midline along the inferior aspect of the scrotum there is an irregular hypoechoic collection with rim enhancement. This measures 1.6 x 4.4 x 4.9 cm and is slightly larger when compared with the prior study, suspicious for abscess. There is heterogeneous appearance along the inferior aspect of the left hemiscrotum, suspicious for additional complex fluid collection. There is more simple appearing fluid along the dependent aspect of the right hemiscrotum, possibly representing hydrocele versus edema.  There is bilateral inguinal adenopathy, largest node on the right measuring 3.1 cm. Largest node on the left measures 2.2 cm.  Dependent edema identified in the buttocks bilaterally. To the right of midline in the gluteal cleft there is question of skin edema, skin breakdown, or possible abscess. This heterogeneous collection measures 2.4 x 5.3 cm. Correlation with physical exam findings recommended.  Urinary bladder has a normal appearance. There are calcifications of the seminal vesicles. Visualized bowel loops are unremarkable in appearance. There are degenerative changes in the lower lumbar spine.  IMPRESSION: 1. Focal collection along the inferior aspect of the scrotum suspicious for abscess. As CT cannot assess integrity of the blood flow in the testicles, further characterization with scrotal ultrasound and Doppler recommended. 2. Hydrocele versus edema involving the dependent aspect of the right hemiscrotum. 3. Bilateral inguinal adenopathy. 4. Question right gluteal cleft abscess, skin thickening, or breakdown. 5. These results will be called to the ordering clinician or representative by the Radiologist Assistant, and communication documented in the PACS or zVision Dashboard.   Electronically Signed   By: Shon Hale M.D.   On: 02/15/2014 08:33    I independently reviewed the above imaging studies.  Impression/Recommendation 43 yo with DM/HTN/HLD, chronic folliculitis with h/o scrotal/perineal abscesses who presents with draining scrotal and perineal abscesses  1) Agree with broad spectrum antibiotic coverage 2) Will coordinate with general surgery regarding timing of drainage under general anesthesia

## 2014-02-16 DIAGNOSIS — I429 Cardiomyopathy, unspecified: Secondary | ICD-10-CM

## 2014-02-16 LAB — COMPREHENSIVE METABOLIC PANEL
ALBUMIN: 2.1 g/dL — AB (ref 3.5–5.2)
ALT: 8 U/L (ref 0–53)
ANION GAP: 13 (ref 5–15)
AST: 9 U/L (ref 0–37)
Alkaline Phosphatase: 132 U/L — ABNORMAL HIGH (ref 39–117)
BUN: 16 mg/dL (ref 6–23)
CO2: 24 mEq/L (ref 19–32)
CREATININE: 1.04 mg/dL (ref 0.50–1.35)
Calcium: 8.8 mg/dL (ref 8.4–10.5)
Chloride: 95 mEq/L — ABNORMAL LOW (ref 96–112)
GFR calc Af Amer: >90 mL/min (ref >90)
GFR calc non Af Amer: 85 mL/min — ABNORMAL LOW (ref >90)
Glucose, Bld: 167 mg/dL — ABNORMAL HIGH (ref 70–99)
POTASSIUM: 4.8 meq/L (ref 3.7–5.3)
Sodium: 132 mEq/L — ABNORMAL LOW (ref 137–147)
TOTAL PROTEIN: 7.7 g/dL (ref 6.0–8.3)
Total Bilirubin: 0.5 mg/dL (ref 0.3–1.2)

## 2014-02-16 LAB — CBC
HCT: 33.8 % — ABNORMAL LOW (ref 39.0–52.0)
Hemoglobin: 11.3 g/dL — ABNORMAL LOW (ref 13.0–17.0)
MCH: 30.2 pg (ref 26.0–34.0)
MCHC: 33.4 g/dL (ref 30.0–36.0)
MCV: 90.4 fL (ref 78.0–100.0)
Platelets: 409 10*3/uL — ABNORMAL HIGH (ref 150–400)
RBC: 3.74 MIL/uL — ABNORMAL LOW (ref 4.22–5.81)
RDW: 15 % (ref 11.5–15.5)
WBC: 15 10*3/uL — ABNORMAL HIGH (ref 4.0–10.5)

## 2014-02-16 LAB — GLUCOSE, CAPILLARY
GLUCOSE-CAPILLARY: 241 mg/dL — AB (ref 70–99)
Glucose-Capillary: 211 mg/dL — ABNORMAL HIGH (ref 70–99)
Glucose-Capillary: 248 mg/dL — ABNORMAL HIGH (ref 70–99)

## 2014-02-16 NOTE — Progress Notes (Signed)
Subjective: Complains of pain at scrotum and rectum  Objective: Vital signs in last 24 hours: Temp:  [98.3 F (36.8 C)-99.1 F (37.3 C)] 98.3 F (36.8 C) (11/07 0434) Pulse Rate:  [94-106] 96 (11/07 0434) Resp:  [16-23] 18 (11/07 0434) BP: (117-151)/(75-94) 123/83 mmHg (11/07 0434) SpO2:  [91 %-100 %] 99 % (11/07 0434) Weight:  [222 lb 9.6 oz (100.971 kg)] 222 lb 9.6 oz (100.971 kg) (11/06 1441) Last BM Date: 02/15/14  Intake/Output from previous day: 11/06 0701 - 11/07 0700 In: 1911.3 [P.O.:240; I.V.:1271.3; IV Piggyback:400] Out: 3050 [Urine:3050] Intake/Output this shift:    Male genitalia: significant chronic hidradenitis of scrotum and perirectal areas with multiple draining sites  Lab Results:   Recent Labs  02/15/14 0245 02/16/14 0459  WBC 13.2* 15.0*  HGB 11.4* 11.3*  HCT 33.5* 33.8*  PLT 440* 409*   BMET  Recent Labs  02/15/14 0245 02/16/14 0459  NA 131* 132*  K 4.3 4.8  CL 93* 95*  CO2 21 24  GLUCOSE 67* 167*  BUN 15 16  CREATININE 1.08 1.04  CALCIUM 8.7 8.8   PT/INR No results for input(s): LABPROT, INR in the last 72 hours. ABG No results for input(s): PHART, HCO3 in the last 72 hours.  Invalid input(s): PCO2, PO2  Studies/Results: Ct Pelvis W Contrast  02/15/2014   CLINICAL DATA:  Bilateral testicular swelling and pain for 2 days. No known injury. No dysuria. No fever or chills.  EXAM: CT PELVIS WITH CONTRAST  TECHNIQUE: Multidetector CT imaging of the pelvis was performed using the standard protocol following the bolus administration of intravenous contrast.  CONTRAST:  114mL OMNIPAQUE IOHEXOL 300 MG/ML SOLN 100 cc Omnipaque 300  COMPARISON:  10/31/2013  FINDINGS: There is diffuse scrotal wall edema bilaterally. Just to the left of midline along the inferior aspect of the scrotum there is an irregular hypoechoic collection with rim enhancement. This measures 1.6 x 4.4 x 4.9 cm and is slightly larger when compared with the prior study,  suspicious for abscess. There is heterogeneous appearance along the inferior aspect of the left hemiscrotum, suspicious for additional complex fluid collection. There is more simple appearing fluid along the dependent aspect of the right hemiscrotum, possibly representing hydrocele versus edema.  There is bilateral inguinal adenopathy, largest node on the right measuring 3.1 cm. Largest node on the left measures 2.2 cm. Dependent edema identified in the buttocks bilaterally. To the right of midline in the gluteal cleft there is question of skin edema, skin breakdown, or possible abscess. This heterogeneous collection measures 2.4 x 5.3 cm. Correlation with physical exam findings recommended.  Urinary bladder has a normal appearance. There are calcifications of the seminal vesicles. Visualized bowel loops are unremarkable in appearance. There are degenerative changes in the lower lumbar spine.  IMPRESSION: 1. Focal collection along the inferior aspect of the scrotum suspicious for abscess. As CT cannot assess integrity of the blood flow in the testicles, further characterization with scrotal ultrasound and Doppler recommended. 2. Hydrocele versus edema involving the dependent aspect of the right hemiscrotum. 3. Bilateral inguinal adenopathy. 4. Question right gluteal cleft abscess, skin thickening, or breakdown. 5. These results will be called to the ordering clinician or representative by the Radiologist Assistant, and communication documented in the PACS or zVision Dashboard.   Electronically Signed   By: Shon Hale M.D.   On: 02/15/2014 08:33    Anti-infectives: Anti-infectives    Start     Dose/Rate Route Frequency Ordered Stop   02/15/14  1600  piperacillin-tazobactam (ZOSYN) IVPB 3.375 g     3.375 g12.5 mL/hr over 240 Minutes Intravenous Every 8 hours 02/15/14 1508     02/15/14 0700  piperacillin-tazobactam (ZOSYN) IVPB 3.375 g  Status:  Discontinued     3.375 g12.5 mL/hr over 240 Minutes Intravenous   Once 02/15/14 0649 02/15/14 0655   02/15/14 0700  piperacillin-tazobactam (ZOSYN) IVPB 3.375 g     3.375 g100 mL/hr over 30 Minutes Intravenous  Once 02/15/14 0655 02/15/14 0843   02/15/14 0700  vancomycin (VANCOCIN) IVPB 750 mg/150 ml premix     750 mg150 mL/hr over 60 Minutes Intravenous 3 times per day 02/15/14 0655        Assessment/Plan: s/p * No surgery found * continue abx  Will coordinate I and D with urology  LOS: 1 day    TOTH III,PAUL S 02/16/2014

## 2014-02-16 NOTE — Progress Notes (Signed)
Patient ID: Austin Alexander  male  [id number]    DOB: Nov 06, 1970    DOA: 02/15/2014  PCP: No PCP Per Patient   Brief history of present illness  Patient is a 43 year old male with history of diabetes, hypertension, hyperlipidemia, cardiomyopathy, chronic folliculitis who presented with scrotal swelling and pain that started 3 days ago prior to admission and has been getting progressively worse. In the ER patient was found to have a draining abscess from his scrotum and inner buttock. CT scan showed a scrotal and gluteal abscess. General surgery and urology was consulted. Patient was admitted for further management.   Assessment/Plan: Principal Problem:   Scrotal wall abscess - continue pain control, IV vancomycin and Zosyn - NPO, IV fluids, awaiting surgery - follow blood cultures  Active Problems:   HTN (hypertension) - currently stable continue ACEI, Coreg, spironolactone    Uncontrolled diabetes mellitus - Continue sliding scale insulin  Alcohol abuse - Continue CIWA protocol with Ativan    Tobacco abuse - Continue nicotine patch    Cardiomyopathy - Currently stable, no cardiac symptoms, continue Coreg  DVT Prophylaxis:Lovenox  Code Status:full code  Family Communication:  Disposition:  Consultants:  Gen. Surgery  Urology  Procedures:  CT pelvis  Antibiotics:  IV vancomycin  IV Zosyn  Subjective: Patient seen and examined, awaiting surgery, frustrated, wants to eat  Objective: Weight change: 1.179 kg (2 lb 9.6 oz)  Intake/Output Summary (Last 24 hours) at 02/16/14 1046 Last data filed at 02/16/14 0654  Gross per 24 hour  Intake 1911.25 ml  Output   3050 ml  Net -1138.75 ml   Blood pressure 144/97, pulse 111, temperature 98.9 F (37.2 C), temperature source Oral, resp. rate 20, height 5\' 11"  (1.803 m), weight 100.971 kg (222 lb 9.6 oz), SpO2 99 %.  Physical Exam: General: Alert and awake, oriented x3, not in any acute distress. CVS:  S1-S2 clear, no murmur rubs or gallops Chest: clear to auscultation bilaterally, no wheezing, rales or rhonchi Abdomen: soft nontender, nondistended, normal bowel sounds  Extremities: no cyanosis, clubbing or edema noted bilaterally GU: areas of induration on the scrotal wall, scrotum is enlarged and tender Neuro: Cranial nerves II-XII intact, no focal neurological deficits  Lab Results: Basic Metabolic Panel:  Recent Labs Lab 02/15/14 0245 02/16/14 0459  NA 131* 132*  K 4.3 4.8  CL 93* 95*  CO2 21 24  GLUCOSE 67* 167*  BUN 15 16  CREATININE 1.08 1.04  CALCIUM 8.7 8.8   Liver Function Tests:  Recent Labs Lab 02/15/14 0245 02/16/14 0459  AST 13 9  ALT 11 8  ALKPHOS 128* 132*  BILITOT 0.4 0.5  PROT 8.2 7.7  ALBUMIN 2.4* 2.1*   No results for input(s): LIPASE, AMYLASE in the last 168 hours. No results for input(s): AMMONIA in the last 168 hours. CBC:  Recent Labs Lab 02/15/14 0245 02/16/14 0459  WBC 13.2* 15.0*  NEUTROABS 10.9*  --   HGB 11.4* 11.3*  HCT 33.5* 33.8*  MCV 88.6 90.4  PLT 440* 409*   Cardiac Enzymes: No results for input(s): CKTOTAL, CKMB, CKMBINDEX, TROPONINI in the last 168 hours. BNP: Invalid input(s): POCBNP CBG:  Recent Labs Lab 02/15/14 1700 02/15/14 1808 02/15/14 2027 02/16/14 0736  GLUCAP 55* 77 188* 241*     Micro Results: No results found for this or any previous visit (from the past 240 hour(s)).  Studies/Results: Ct Pelvis W Contrast  02/15/2014   CLINICAL DATA:  Bilateral testicular swelling and pain for  2 days. No known injury. No dysuria. No fever or chills.  EXAM: CT PELVIS WITH CONTRAST  TECHNIQUE: Multidetector CT imaging of the pelvis was performed using the standard protocol following the bolus administration of intravenous contrast.  CONTRAST:  162mL OMNIPAQUE IOHEXOL 300 MG/ML SOLN 100 cc Omnipaque 300  COMPARISON:  10/31/2013  FINDINGS: There is diffuse scrotal wall edema bilaterally. Just to the left of midline  along the inferior aspect of the scrotum there is an irregular hypoechoic collection with rim enhancement. This measures 1.6 x 4.4 x 4.9 cm and is slightly larger when compared with the prior study, suspicious for abscess. There is heterogeneous appearance along the inferior aspect of the left hemiscrotum, suspicious for additional complex fluid collection. There is more simple appearing fluid along the dependent aspect of the right hemiscrotum, possibly representing hydrocele versus edema.  There is bilateral inguinal adenopathy, largest node on the right measuring 3.1 cm. Largest node on the left measures 2.2 cm. Dependent edema identified in the buttocks bilaterally. To the right of midline in the gluteal cleft there is question of skin edema, skin breakdown, or possible abscess. This heterogeneous collection measures 2.4 x 5.3 cm. Correlation with physical exam findings recommended.  Urinary bladder has a normal appearance. There are calcifications of the seminal vesicles. Visualized bowel loops are unremarkable in appearance. There are degenerative changes in the lower lumbar spine.  IMPRESSION: 1. Focal collection along the inferior aspect of the scrotum suspicious for abscess. As CT cannot assess integrity of the blood flow in the testicles, further characterization with scrotal ultrasound and Doppler recommended. 2. Hydrocele versus edema involving the dependent aspect of the right hemiscrotum. 3. Bilateral inguinal adenopathy. 4. Question right gluteal cleft abscess, skin thickening, or breakdown. 5. These results will be called to the ordering clinician or representative by the Radiologist Assistant, and communication documented in the PACS or zVision Dashboard.   Electronically Signed   By: Shon Hale M.D.   On: 02/15/2014 08:33    Medications: Scheduled Meds: . aspirin EC  81 mg Oral Daily  . carvedilol  25 mg Oral BID WC  . enoxaparin (LOVENOX) injection  50 mg Subcutaneous Q24H  . folic acid   1 mg Oral Daily  . Influenza vac split quadrivalent PF  0.5 mL Intramuscular Tomorrow-1000  . insulin aspart  0-9 Units Subcutaneous TID WC  . lisinopril  20 mg Oral Daily  . multivitamin with minerals  1 tablet Oral Daily  . piperacillin-tazobactam (ZOSYN)  IV  3.375 g Intravenous Q8H  . simvastatin  40 mg Oral Daily  . spironolactone  25 mg Oral Daily  . thiamine  100 mg Oral Daily   Or  . thiamine  100 mg Intravenous Daily  . vancomycin  750 mg Intravenous 3 times per day      LOS: 1 day   Kaytlynn Kochan M.D. [facility] 02/16/2014, 10:46 AM Pager: [phone]  If 7PM-7AM, please contact night-coverage [email or web address] Password [email or web address]

## 2014-02-16 NOTE — Progress Notes (Signed)
Patient ID: Austin Alexander, male   DOB: 18-Sep-1970, 43 y.o.   MRN: [id number]    Subjective: Pt with right gluteal abscess and scrotal abscess per imaging.  Per general surgery recommendations, patient has been treated with antibiotics and they would like to delay any surgical intervention until tomorrow.  Patient has been stable. Pain controlled.  Objective: Vital signs in last 24 hours: Temp:  [98.3 F (36.8 C)-99.1 F (37.3 C)] 98.9 F (37.2 C) (11/07 1029) Pulse Rate:  [96-111] 111 (11/07 1029) Resp:  [18-20] 20 (11/07 1029) BP: (117-144)/(75-97) 144/97 mmHg (11/07 1029) SpO2:  [97 %-100 %] 99 % (11/07 1029)  Intake/Output from previous day: 11/06 0701 - 11/07 0700 In: 1911.3 [P.O.:240; I.V.:1271.3; IV Piggyback:400] Out: 3050 [Urine:3050] Intake/Output this shift:    Physical Exam:  General: Alert and oriented GU: Scrotum edematous with an area of fluctuance inferiorly and purulent drainage from left inferior scrotum.  Lab Results:  Recent Labs  02/15/14 0245 02/16/14 0459  HGB 11.4* 11.3*  HCT 33.5* 33.8*   Lab Results  Component Value Date   WBC 15.0* 02/16/2014   HGB 11.3* 02/16/2014   HCT 33.8* 02/16/2014   MCV 90.4 02/16/2014   PLT 409* 02/16/2014     BMET  Recent Labs  02/15/14 0245 02/16/14 0459  NA 131* 132*  K 4.3 4.8  CL 93* 95*  CO2 21 24  GLUCOSE 67* 167*  BUN 15 16  CREATININE 1.08 1.04  CALCIUM 8.7 8.8     Studies/Results: Ct Pelvis W Contrast  02/15/2014   CLINICAL DATA:  Bilateral testicular swelling and pain for 2 days. No known injury. No dysuria. No fever or chills.  EXAM: CT PELVIS WITH CONTRAST  TECHNIQUE: Multidetector CT imaging of the pelvis was performed using the standard protocol following the bolus administration of intravenous contrast.  CONTRAST:  136mL OMNIPAQUE IOHEXOL 300 MG/ML SOLN 100 cc Omnipaque 300  COMPARISON:  10/31/2013  FINDINGS: There is diffuse scrotal wall edema bilaterally. Just to the left of midline  along the inferior aspect of the scrotum there is an irregular hypoechoic collection with rim enhancement. This measures 1.6 x 4.4 x 4.9 cm and is slightly larger when compared with the prior study, suspicious for abscess. There is heterogeneous appearance along the inferior aspect of the left hemiscrotum, suspicious for additional complex fluid collection. There is more simple appearing fluid along the dependent aspect of the right hemiscrotum, possibly representing hydrocele versus edema.  There is bilateral inguinal adenopathy, largest node on the right measuring 3.1 cm. Largest node on the left measures 2.2 cm. Dependent edema identified in the buttocks bilaterally. To the right of midline in the gluteal cleft there is question of skin edema, skin breakdown, or possible abscess. This heterogeneous collection measures 2.4 x 5.3 cm. Correlation with physical exam findings recommended.  Urinary bladder has a normal appearance. There are calcifications of the seminal vesicles. Visualized bowel loops are unremarkable in appearance. There are degenerative changes in the lower lumbar spine.  IMPRESSION: 1. Focal collection along the inferior aspect of the scrotum suspicious for abscess. As CT cannot assess integrity of the blood flow in the testicles, further characterization with scrotal ultrasound and Doppler recommended. 2. Hydrocele versus edema involving the dependent aspect of the right hemiscrotum. 3. Bilateral inguinal adenopathy. 4. Question right gluteal cleft abscess, skin thickening, or breakdown. 5. These results will be called to the ordering clinician or representative by the Radiologist Assistant, and communication documented in the PACS or zVision Dashboard.  Electronically Signed   By: Shon Hale M.D.   On: 02/15/2014 08:33    Assessment/Plan: Scrotal abscess:  Patient needs incision and drainage.  Per general surgery, they have recommended further antibiotic therapy with plans to proceed with  surgical intervention tomorrow in combination with urology.  Patient will be NPO after MN.  I discussed the potential benefits and risks of the procedure, side effects of the proposed treatment, the likelihood of the patient achieving the goals of the procedure, and any potential problems that might occur during the procedure or recuperation.    LOS: 1 day   Avo Schlachter,LES 02/16/2014, 3:45 PM

## 2014-02-17 ENCOUNTER — Inpatient Hospital Stay (HOSPITAL_COMMUNITY): Payer: 59 | Admitting: Anesthesiology

## 2014-02-17 ENCOUNTER — Encounter (HOSPITAL_COMMUNITY)
Admission: EM | Disposition: A | Discharge status: Home Health | Payer: Self-pay | Source: Home / Self Care | Attending: Internal Medicine

## 2014-02-17 HISTORY — PX: INCISION AND DRAINAGE PERIRECTAL ABSCESS: SHX1804

## 2014-02-17 LAB — CBC
HCT: 29.9 % — ABNORMAL LOW (ref 39.0–52.0)
Hemoglobin: 9.9 g/dL — ABNORMAL LOW (ref 13.0–17.0)
MCH: 30.1 pg (ref 26.0–34.0)
MCHC: 33.1 g/dL (ref 30.0–36.0)
MCV: 90.9 fL (ref 78.0–100.0)
PLATELETS: 409 10*3/uL — AB (ref 150–400)
RBC: 3.29 MIL/uL — ABNORMAL LOW (ref 4.22–5.81)
RDW: 14.6 % (ref 11.5–15.5)
WBC: 12.6 10*3/uL — ABNORMAL HIGH (ref 4.0–10.5)

## 2014-02-17 LAB — BASIC METABOLIC PANEL
Anion gap: 10 (ref 5–15)
BUN: 12 mg/dL (ref 6–23)
CALCIUM: 8.6 mg/dL (ref 8.4–10.5)
CO2: 26 mEq/L (ref 19–32)
Chloride: 97 mEq/L (ref 96–112)
Creatinine, Ser: 1.04 mg/dL (ref 0.50–1.35)
GFR, EST NON AFRICAN AMERICAN: 85 mL/min — AB (ref >90)
Glucose, Bld: 234 mg/dL — ABNORMAL HIGH (ref 70–99)
Potassium: 4.7 mEq/L (ref 3.7–5.3)
Sodium: 133 mEq/L — ABNORMAL LOW (ref 137–147)

## 2014-02-17 LAB — GLUCOSE, CAPILLARY
GLUCOSE-CAPILLARY: 250 mg/dL — AB (ref 70–99)
GLUCOSE-CAPILLARY: 159 mg/dL — AB (ref 70–99)
GLUCOSE-CAPILLARY: 134 mg/dL — AB (ref 70–99)
Glucose-Capillary: 270 mg/dL — ABNORMAL HIGH (ref 70–99)
Glucose-Capillary: 170 mg/dL — ABNORMAL HIGH (ref 70–99)
Glucose-Capillary: 172 mg/dL — ABNORMAL HIGH (ref 70–99)

## 2014-02-17 LAB — SURGICAL PCR SCREEN
MRSA, PCR: NEGATIVE
Staphylococcus aureus: NEGATIVE

## 2014-02-17 LAB — VANCOMYCIN, TROUGH: Vancomycin Tr: 13.3 ug/mL (ref 10.0–20.0)

## 2014-02-17 SURGERY — INCISION AND DRAINAGE, ABSCESS, PERIRECTAL
Anesthesia: General | Site: Perineum

## 2014-02-17 MED ORDER — LIDOCAINE HCL (CARDIAC) 20 MG/ML IV SOLN
INTRAVENOUS | Status: DC | PRN
  Administered 2014-02-17: 80 mg via INTRAVENOUS

## 2014-02-17 MED ORDER — FENTANYL CITRATE 0.05 MG/ML IJ SOLN
INTRAMUSCULAR | Status: AC
  Filled 2014-02-17: qty 5

## 2014-02-17 MED ORDER — ROCURONIUM BROMIDE 50 MG/5ML IV SOLN
INTRAVENOUS | Status: AC
  Filled 2014-02-17: qty 1

## 2014-02-17 MED ORDER — ALBUMIN HUMAN 5 % IV SOLN
INTRAVENOUS | Status: DC | PRN
  Administered 2014-02-17: 14:00:00 via INTRAVENOUS

## 2014-02-17 MED ORDER — LIDOCAINE HCL (CARDIAC) 20 MG/ML IV SOLN
INTRAVENOUS | Status: AC
  Filled 2014-02-17: qty 5

## 2014-02-17 MED ORDER — KETOROLAC TROMETHAMINE 30 MG/ML IJ SOLN
INTRAMUSCULAR | Status: DC | PRN
  Administered 2014-02-17: 30 mg via INTRAVENOUS

## 2014-02-17 MED ORDER — SUCCINYLCHOLINE CHLORIDE 20 MG/ML IJ SOLN
INTRAMUSCULAR | Status: DC | PRN
  Administered 2014-02-17: 120 mg via INTRAVENOUS

## 2014-02-17 MED ORDER — MIDAZOLAM HCL 5 MG/5ML IJ SOLN
INTRAMUSCULAR | Status: DC | PRN
  Administered 2014-02-17: 2 mg via INTRAVENOUS

## 2014-02-17 MED ORDER — HYDROMORPHONE HCL 1 MG/ML IJ SOLN
1.0000 mg | INTRAMUSCULAR | Status: DC | PRN
  Administered 2014-02-17 (×2): 1 mg via INTRAVENOUS
  Filled 2014-02-17 (×2): qty 1

## 2014-02-17 MED ORDER — PROPOFOL 10 MG/ML IV BOLUS
INTRAVENOUS | Status: AC
  Filled 2014-02-17: qty 20

## 2014-02-17 MED ORDER — ARTIFICIAL TEARS OP OINT
TOPICAL_OINTMENT | OPHTHALMIC | Status: DC | PRN
  Administered 2014-02-17: 1 via OPHTHALMIC

## 2014-02-17 MED ORDER — 0.9 % SODIUM CHLORIDE (POUR BTL) OPTIME
TOPICAL | Status: DC | PRN
  Administered 2014-02-17: 1000 mL

## 2014-02-17 MED ORDER — OXYCODONE-ACETAMINOPHEN 5-325 MG PO TABS
1.0000 | ORAL_TABLET | ORAL | Status: DC | PRN
  Administered 2014-02-18 – 2014-02-20 (×8): 2 via ORAL
  Filled 2014-02-17 (×8): qty 2

## 2014-02-17 MED ORDER — SODIUM CHLORIDE 0.9 % IV SOLN
INTRAVENOUS | Status: DC | PRN
  Administered 2014-02-17: 12:00:00 via INTRAVENOUS

## 2014-02-17 MED ORDER — FENTANYL CITRATE 0.05 MG/ML IJ SOLN
INTRAMUSCULAR | Status: DC | PRN
  Administered 2014-02-17 (×3): 50 ug via INTRAVENOUS
  Administered 2014-02-17: 100 ug via INTRAVENOUS

## 2014-02-17 MED ORDER — LIDOCAINE HCL 4 % MT SOLN
OROMUCOSAL | Status: DC | PRN
  Administered 2014-02-17: 4 mL via TOPICAL

## 2014-02-17 MED ORDER — SUCCINYLCHOLINE CHLORIDE 20 MG/ML IJ SOLN
INTRAMUSCULAR | Status: AC
  Filled 2014-02-17: qty 1

## 2014-02-17 MED ORDER — ALBUTEROL SULFATE HFA 108 (90 BASE) MCG/ACT IN AERS
INHALATION_SPRAY | RESPIRATORY_TRACT | Status: AC
  Filled 2014-02-17: qty 6.7

## 2014-02-17 MED ORDER — BUPIVACAINE-EPINEPHRINE (PF) 0.5% -1:200000 IJ SOLN
INTRAMUSCULAR | Status: AC
  Filled 2014-02-17: qty 30

## 2014-02-17 MED ORDER — PROPOFOL 10 MG/ML IV BOLUS
INTRAVENOUS | Status: DC | PRN
  Administered 2014-02-17: 200 mg via INTRAVENOUS
  Administered 2014-02-17: 20 mg via INTRAVENOUS

## 2014-02-17 MED ORDER — BUPIVACAINE-EPINEPHRINE (PF) 0.25% -1:200000 IJ SOLN
INTRAMUSCULAR | Status: DC | PRN
  Administered 2014-02-17: 30 mL

## 2014-02-17 MED ORDER — HYDROMORPHONE HCL 1 MG/ML IJ SOLN
1.0000 mg | INTRAMUSCULAR | Status: AC | PRN
  Administered 2014-02-17 – 2014-02-18 (×4): 1 mg via INTRAVENOUS
  Filled 2014-02-17 (×4): qty 1

## 2014-02-17 MED ORDER — ONDANSETRON HCL 4 MG/2ML IJ SOLN
INTRAMUSCULAR | Status: DC | PRN
  Administered 2014-02-17: 4 mg via INTRAVENOUS

## 2014-02-17 MED ORDER — NEOSTIGMINE METHYLSULFATE 10 MG/10ML IV SOLN
INTRAVENOUS | Status: AC
  Filled 2014-02-17: qty 1

## 2014-02-17 MED ORDER — PHENYLEPHRINE HCL 10 MG/ML IJ SOLN
INTRAMUSCULAR | Status: DC | PRN
  Administered 2014-02-17 (×3): 40 ug via INTRAVENOUS

## 2014-02-17 MED ORDER — GLYCOPYRROLATE 0.2 MG/ML IJ SOLN
INTRAMUSCULAR | Status: AC
  Filled 2014-02-17: qty 3

## 2014-02-17 MED ORDER — MIDAZOLAM HCL 2 MG/2ML IJ SOLN
INTRAMUSCULAR | Status: AC
  Filled 2014-02-17: qty 2

## 2014-02-17 MED ORDER — KETOROLAC TROMETHAMINE 30 MG/ML IJ SOLN
INTRAMUSCULAR | Status: AC
  Filled 2014-02-17: qty 1

## 2014-02-17 MED ORDER — ONDANSETRON HCL 4 MG/2ML IJ SOLN
INTRAMUSCULAR | Status: AC
  Filled 2014-02-17: qty 2

## 2014-02-17 MED ORDER — LACTATED RINGERS IV SOLN
INTRAVENOUS | Status: DC | PRN
  Administered 2014-02-17: 13:00:00 via INTRAVENOUS

## 2014-02-17 MED ORDER — HYDROMORPHONE HCL 1 MG/ML IJ SOLN: 0.2500 mg | INTRAMUSCULAR | Status: DC | PRN

## 2014-02-17 SURGICAL SUPPLY — 31 items
CANISTER SUCTION 2500CC (MISCELLANEOUS) IMPLANT
COVER SURGICAL LIGHT HANDLE (MISCELLANEOUS) ×3 IMPLANT
DRAPE UTILITY 15X26 W/TAPE STR (DRAPE) ×6 IMPLANT
DRSG PAD ABDOMINAL 8X10 ST (GAUZE/BANDAGES/DRESSINGS) ×3 IMPLANT
ELECT CAUTERY BLADE 6.4 (BLADE) IMPLANT
ELECT REM PT RETURN 9FT ADLT (ELECTROSURGICAL) ×3
ELECTRODE REM PT RTRN 9FT ADLT (ELECTROSURGICAL) ×1 IMPLANT
GAUZE PACKING FOLDED 1IN STRL (GAUZE/BANDAGES/DRESSINGS) ×3 IMPLANT
GAUZE PACKING IODOFORM 1 (PACKING) IMPLANT
GAUZE SPONGE 4X4 12PLY STRL (GAUZE/BANDAGES/DRESSINGS) ×3 IMPLANT
GLOVE SURG SIGNA 7.5 PF LTX (GLOVE) ×3 IMPLANT
GOWN STRL REUS W/ TWL LRG LVL3 (GOWN DISPOSABLE) ×1 IMPLANT
GOWN STRL REUS W/ TWL XL LVL3 (GOWN DISPOSABLE) ×1 IMPLANT
GOWN STRL REUS W/TWL LRG LVL3 (GOWN DISPOSABLE) ×2
GOWN STRL REUS W/TWL XL LVL3 (GOWN DISPOSABLE) ×2
KIT BASIN OR (CUSTOM PROCEDURE TRAY) ×3 IMPLANT
KIT ROOM TURNOVER OR (KITS) ×3 IMPLANT
NS IRRIG 1000ML POUR BTL (IV SOLUTION) ×3 IMPLANT
PACK LITHOTOMY IV (CUSTOM PROCEDURE TRAY) ×3 IMPLANT
PAD ARMBOARD 7.5X6 YLW CONV (MISCELLANEOUS) ×3 IMPLANT
PENCIL BUTTON HOLSTER BLD 10FT (ELECTRODE) ×3 IMPLANT
SPONGE LAP 18X18 X RAY DECT (DISPOSABLE) IMPLANT
SWAB COLLECTION DEVICE MRSA (MISCELLANEOUS) IMPLANT
SYR BULB 3OZ (MISCELLANEOUS) IMPLANT
TOWEL OR 17X24 6PK STRL BLUE (TOWEL DISPOSABLE) ×3 IMPLANT
TOWEL OR 17X26 10 PK STRL BLUE (TOWEL DISPOSABLE) ×3 IMPLANT
TUBE ANAEROBIC SPECIMEN COL (MISCELLANEOUS) ×3 IMPLANT
TUBE CONNECTING 12'X1/4 (SUCTIONS)
TUBE CONNECTING 12X1/4 (SUCTIONS) IMPLANT
UNDERPAD 30X30 INCONTINENT (UNDERPADS AND DIAPERS) ×9 IMPLANT
YANKAUER SUCT BULB TIP NO VENT (SUCTIONS) IMPLANT

## 2014-02-17 NOTE — Op Note (Signed)
Preoperative diagnosis:scrotal abscess with diffuse hidradenitis Postoperative diagnosis:same  Procedure:incision and drainage of scrotal abscess   Surgeon: Bernestine Amass M.D.  Anesthesia: Gen.  Indications:patient has a prior history of scrotal abscess treated by Dr. Dutch Gray. Dr. Gaynelle Arabian was consulted on Friday for recurrent scrotal abscess along with hidradenitis. The patient has been evaluated by general surgery and urology. Gen. Surgery wanted to take him to surgery today for incision of multiple areas of purulence with his hidradenitis. On evaluation there was a fluctuant area on the inferior aspect of the scrotum with some spontaneous drainage. We felt like it was prudent to open that area to assure better drainage.     Technique and findings:the patient was brought the operating room where he had successful induction of general anesthesia. He was placed in lithotomy position and prepped and draped in usual manner. Appropriate surgical timeout was performed. We did extend a small area of drainage from the inferior aspect of the scrotum. There was a small cavity but not a large abscess cavity. Some tissue was taken for culture. The area was copiously irrigated and infiltrated with some Marcaine. Packing with dry gauze was performed. Dr. Ninfa Linden from general surgery made incisions and areas of suppurative hidradenitis. His portion will be dictated separately. The patient had no obvious complications and was brought to PACU in stable condition.

## 2014-02-17 NOTE — Progress Notes (Signed)
Patient ID: Austin Alexander  male  [id number]    DOB: 12-Apr-1971    DOA: 02/15/2014  PCP: No PCP Per Patient   Brief history of present illness  Patient is a 43 year old male with history of diabetes, hypertension, hyperlipidemia, cardiomyopathy, chronic folliculitis who presented with scrotal swelling and pain that started 3 days ago prior to admission and has been getting progressively worse. In the ER patient was found to have a draining abscess from his scrotum and inner buttock. CT scan showed a scrotal and gluteal abscess. General surgery and urology was consulted. Patient was admitted for further management.   Assessment/Plan: Principal Problem:   Scrotal wall abscess - continue pain control, IV vancomycin and Zosyn - NPO, IV fluids, awaiting surgery, hopefully today - follow blood cultures  Active Problems:   HTN (hypertension) - currently stable continue ACEI, Coreg, spironolactone    Uncontrolled diabetes mellitus - Continue sliding scale insulin  Alcohol abuse - Continue CIWA protocol with Ativan    Tobacco abuse - Continue nicotine patch    Cardiomyopathy - Currently stable, no cardiac symptoms, continue Coreg  DVT Prophylaxis:Lovenox  Code Status:full code  Family Communication:  Disposition:  Consultants:  Gen. Surgery  Urology  Procedures:  CT pelvis  Antibiotics:  IV vancomycin  IV Zosyn  Subjective: Patient seen and examined, awaiting surgery  Objective: Weight change: -0.499 kg (-1 lb 1.6 oz)  Intake/Output Summary (Last 24 hours) at 02/17/14 1044 Last data filed at 02/17/14 0612  Gross per 24 hour  Intake    120 ml  Output      0 ml  Net    120 ml   Blood pressure 135/98, pulse 88, temperature 98 F (36.7 C), temperature source Oral, resp. rate 18, height 5\' 11"  (1.803 m), weight 100.472 kg (221 lb 8 oz), SpO2 98 %.  Physical Exam: General: Alert and awake, oriented x3, NAD CVS: S1-S2 clear, no murmur rubs or  gallops Chest: clear to auscultation bilaterally, no wheezing, rales or rhonchi Abdomen: soft nontender, nondistended, normal bowel sounds  Extremities: no cyanosis, clubbing or edema noted bilaterally GU: areas of induration on the scrotal wall, scrotum is enlarged and tender   Lab Results: Basic Metabolic Panel:  Recent Labs Lab 02/16/14 0459 02/17/14 0748  NA 132* 133*  K 4.8 4.7  CL 95* 97  CO2 24 26  GLUCOSE 167* 234*  BUN 16 12  CREATININE 1.04 1.04  CALCIUM 8.8 8.6   Liver Function Tests:  Recent Labs Lab 02/15/14 0245 02/16/14 0459  AST 13 9  ALT 11 8  ALKPHOS 128* 132*  BILITOT 0.4 0.5  PROT 8.2 7.7  ALBUMIN 2.4* 2.1*   No results for input(s): LIPASE, AMYLASE in the last 168 hours. No results for input(s): AMMONIA in the last 168 hours. CBC:  Recent Labs Lab 02/15/14 0245 02/16/14 0459 02/17/14 0748  WBC 13.2* 15.0* 12.6*  NEUTROABS 10.9*  --   --   HGB 11.4* 11.3* 9.9*  HCT 33.5* 33.8* 29.9*  MCV 88.6 90.4 90.9  PLT 440* 409* 409*   Cardiac Enzymes: No results for input(s): CKTOTAL, CKMB, CKMBINDEX, TROPONINI in the last 168 hours. BNP: Invalid input(s): POCBNP CBG:  Recent Labs Lab 02/16/14 0736 02/16/14 1118 02/16/14 1701 02/16/14 2050 02/17/14 0802  GLUCAP 241* 211* 248* 270* 250*     Micro Results: No results found for this or any previous visit (from the past 240 hour(s)).  Studies/Results: Ct Pelvis W Contrast  02/15/2014   CLINICAL  DATA:  Bilateral testicular swelling and pain for 2 days. No known injury. No dysuria. No fever or chills.  EXAM: CT PELVIS WITH CONTRAST  TECHNIQUE: Multidetector CT imaging of the pelvis was performed using the standard protocol following the bolus administration of intravenous contrast.  CONTRAST:  163mL OMNIPAQUE IOHEXOL 300 MG/ML SOLN 100 cc Omnipaque 300  COMPARISON:  10/31/2013  FINDINGS: There is diffuse scrotal wall edema bilaterally. Just to the left of midline along the inferior aspect  of the scrotum there is an irregular hypoechoic collection with rim enhancement. This measures 1.6 x 4.4 x 4.9 cm and is slightly larger when compared with the prior study, suspicious for abscess. There is heterogeneous appearance along the inferior aspect of the left hemiscrotum, suspicious for additional complex fluid collection. There is more simple appearing fluid along the dependent aspect of the right hemiscrotum, possibly representing hydrocele versus edema.  There is bilateral inguinal adenopathy, largest node on the right measuring 3.1 cm. Largest node on the left measures 2.2 cm. Dependent edema identified in the buttocks bilaterally. To the right of midline in the gluteal cleft there is question of skin edema, skin breakdown, or possible abscess. This heterogeneous collection measures 2.4 x 5.3 cm. Correlation with physical exam findings recommended.  Urinary bladder has a normal appearance. There are calcifications of the seminal vesicles. Visualized bowel loops are unremarkable in appearance. There are degenerative changes in the lower lumbar spine.  IMPRESSION: 1. Focal collection along the inferior aspect of the scrotum suspicious for abscess. As CT cannot assess integrity of the blood flow in the testicles, further characterization with scrotal ultrasound and Doppler recommended. 2. Hydrocele versus edema involving the dependent aspect of the right hemiscrotum. 3. Bilateral inguinal adenopathy. 4. Question right gluteal cleft abscess, skin thickening, or breakdown. 5. These results will be called to the ordering clinician or representative by the Radiologist Assistant, and communication documented in the PACS or zVision Dashboard.   Electronically Signed   By: Shon Hale M.D.   On: 02/15/2014 08:33    Medications: Scheduled Meds: . aspirin EC  81 mg Oral Daily  . carvedilol  25 mg Oral BID WC  . enoxaparin (LOVENOX) injection  50 mg Subcutaneous Q24H  . folic acid  1 mg Oral Daily  .  insulin aspart  0-9 Units Subcutaneous TID WC  . lisinopril  20 mg Oral Daily  . multivitamin with minerals  1 tablet Oral Daily  . piperacillin-tazobactam (ZOSYN)  IV  3.375 g Intravenous Q8H  . simvastatin  40 mg Oral Daily  . spironolactone  25 mg Oral Daily  . thiamine  100 mg Oral Daily   Or  . thiamine  100 mg Intravenous Daily  . vancomycin  750 mg Intravenous 3 times per day      LOS: 2 days   Elick Senn M.D. [facility] 02/17/2014, 10:44 AM Pager: [phone]  If 7PM-7AM, please contact night-coverage [email or web address] Password [email or web address]

## 2014-02-17 NOTE — Transfer of Care (Signed)
Immediate Anesthesia Transfer of Care Note  Patient: Austin Alexander  Procedure(s) Performed: Procedure(s): IRRIGATION AND DEBRIDEMENT PERIRECTAL  AND SCROTAL ABSCESS (N/A)  Patient Location: PACU  Anesthesia Type:General  Level of Consciousness: awake and alert   Airway & Oxygen Therapy: Patient Spontanous Breathing and Patient connected to face mask oxygen  Post-op Assessment: Report given to PACU RN, Post -op Vital signs reviewed and stable and Patient moving all extremities  Post vital signs: Reviewed and stable  Complications: No apparent anesthesia complications

## 2014-02-17 NOTE — Anesthesia Preprocedure Evaluation (Addendum)
Anesthesia Evaluation  Patient identified by MRN, date of birth, ID band Patient awake    Reviewed: Allergy & Precautions, H&P , NPO status , Patient's Chart, lab work & pertinent test results, reviewed documented beta blocker date and time   History of Anesthesia Complications (+) history of anesthetic complications  Airway Mallampati: II  TM Distance: >3 FB Neck ROM: Full    Dental  (+) Teeth Intact, Dental Advisory Given   Pulmonary asthma , COPDCurrent Smoker,  breath sounds clear to auscultation        Cardiovascular hypertension, Pt. on medications and Pt. on home beta blockers +CHF (EF 25-30% per echo in June 2015) Rhythm:Regular     Neuro/Psych    GI/Hepatic negative GI ROS, Neg liver ROS,   Endo/Other  diabetes, Oral Hypoglycemic Agents  Renal/GU negative Renal ROS     Musculoskeletal   Abdominal   Peds  Hematology   Anesthesia Other Findings   Reproductive/Obstetrics                          Anesthesia Physical Anesthesia Plan  ASA: III  Anesthesia Plan: General   Post-op Pain Management:    Induction: Intravenous  Airway Management Planned: Oral ETT  Additional Equipment:   Intra-op Plan:   Post-operative Plan: Extubation in OR  Informed Consent: I have reviewed the patients History and Physical, chart, labs and discussed the procedure including the risks, benefits and alternatives for the proposed anesthesia with the patient or authorized representative who has indicated his/her understanding and acceptance.   Dental advisory given  Plan Discussed with: CRNA and Anesthesiologist  Anesthesia Plan Comments:         Anesthesia Quick Evaluation

## 2014-02-17 NOTE — Progress Notes (Signed)
ANTIBIOTIC CONSULT NOTE - FOLLOW UP  Pharmacy Consult for vancomycin Indication: scrotal abscess/cellulitis  No Known Allergies  Patient Measurements: Height: 5\' 11"  (180.3 cm) Weight: 221 lb 8 oz (100.472 kg) IBW/kg (Calculated) : 75.3 Adjusted Body Weight:   Vital Signs: Temp: 98 F (36.7 C) (11/08 1033) Temp Source: Oral (11/08 1033) BP: 135/98 mmHg (11/08 1033) Pulse Rate: 88 (11/08 1033) Intake/Output from previous day: 11/07 0701 - 11/08 0700 In: 120 [P.O.:120] Out: -  Intake/Output from this shift:    Labs:  Recent Labs  02/15/14 0245 02/16/14 0459 02/17/14 0748  WBC 13.2* 15.0* 12.6*  HGB 11.4* 11.3* 9.9*  PLT 440* 409* 409*  CREATININE 1.08 1.04 1.04   Estimated Creatinine Clearance: 108.3 mL/min (by C-G formula based on Cr of 1.04). No results for input(s): VANCOTROUGH, VANCOPEAK, VANCORANDOM, GENTTROUGH, GENTPEAK, GENTRANDOM, TOBRATROUGH, TOBRAPEAK, TOBRARND, AMIKACINPEAK, AMIKACINTROU, AMIKACIN in the last 72 hours.   Microbiology: No results found for this or any previous visit (from the past 720 hour(s)).  Anti-infectives    Start     Dose/Rate Route Frequency Ordered Stop   02/15/14 1600  piperacillin-tazobactam (ZOSYN) IVPB 3.375 g     3.375 g12.5 mL/hr over 240 Minutes Intravenous Every 8 hours 02/15/14 1508     02/15/14 0700  piperacillin-tazobactam (ZOSYN) IVPB 3.375 g  Status:  Discontinued     3.375 g12.5 mL/hr over 240 Minutes Intravenous  Once 02/15/14 0649 02/15/14 0655   02/15/14 0700  piperacillin-tazobactam (ZOSYN) IVPB 3.375 g     3.375 g100 mL/hr over 30 Minutes Intravenous  Once 02/15/14 0655 02/15/14 0843   02/15/14 0700  vancomycin (VANCOCIN) IVPB 750 mg/150 ml premix     750 mg150 mL/hr over 60 Minutes Intravenous 3 times per day 02/15/14 [id number]        Assessment: 43 yo male with scrotal abscess/cellulitis is currently on therapeutic vancomycin.  Vancomycin trough is 13.3.  Goal of Therapy:  Vancomycin trough level 10-15  mcg/ml  Plan:  - continue vancomycin at 750 mg iv q8h - follow up on antibiotic plan  Jakyri Brunkhorst, Tsz-Yin 02/17/2014,12:03 PM

## 2014-02-17 NOTE — Op Note (Signed)
NAMESEVION, SHUTT NO.:  [id number]  MEDICAL RECORD NO.:  [id number]  LOCATION:  6E13C                        FACILITY:  [facility]  PHYSICIAN:  Coralie Keens, M.D. DATE OF BIRTH:  [birth year]  DATE OF PROCEDURE:  02/17/2014 DATE OF DISCHARGE:                              OPERATIVE REPORT   PREOPERATIVE DIAGNOSIS:  Chronic hidradenitis.  POSTOPERATIVE DIAGNOSIS:  Chronic hidradenitis.  PROCEDURE:  Incision and drainage of multiple perianal and perineal abscess.  SURGEON:  Coralie Keens, M.D.  ANESTHESIA:  General, 0.5% Marcaine.  ESTIMATED BLOOD LOSS:  Minimal.  INDICATIONS:  This is a 43 year old gentleman with severe chronic hidradenitis.  He had multiple areas on his scrotum, perianal, and perineal area.  He is being taken to the operating room in conjunction of Urology and General Surgery to drain multiple chronic draining sinus tracts.  PROCEDURE IN DETAIL:  The patient was brought to the operating room, identified as Austin Alexander.  He was placed supine on the operating room table and general anesthesia was induced.  The patient was placed in lithotomy position.  His scrotum and perianal areas were then prepped and draped in usual sterile fashion.  He had multiple fluctuant areas on each side of his perianal area which I opened up with scalpel.  Most of the purulence was drained out of these abscess cavities.  Made small incisions in his perineum and right proximal thigh/groin.  Again, all these were superficial, chronic draining sinus tracts consistent with hidradenitis.  All were anesthetized with Marcaine and packed with half- inch packing gauze.  Dr. Risa Grill proceeded with part of his procedure, doing with the scrotum which was also I and D'ed and packed.  Once the entire procedure was completed, gauze and mesh panties were applied. The patient was then extubated in the operating room and taken in a stable condition to recovery  room.     Coralie Keens, M.D.     DB/MEDQ  D:  02/17/2014  T:  02/17/2014  Job:  [id number]

## 2014-02-17 NOTE — Anesthesia Postprocedure Evaluation (Signed)
  Anesthesia Post-op Note  Patient: Austin Alexander  Procedure(s) Performed: Procedure(s): IRRIGATION AND DEBRIDEMENT PERIRECTAL  AND SCROTAL ABSCESS (N/A)  Patient Location: PACU  Anesthesia Type:General  Level of Consciousness: awake  Airway and Oxygen Therapy: Patient Spontanous Breathing  Post-op Pain: mild  Post-op Assessment: Post-op Vital signs reviewed  Post-op Vital Signs: Reviewed  Last Vitals:  Filed Vitals:   02/17/14 1405  BP: 111/74  Pulse: 78  Temp:   Resp: 24    Complications: No apparent anesthesia complications

## 2014-02-17 NOTE — Progress Notes (Signed)
Subjective: Patient reports no significant clinical change. He continues to have drainage from a small opening on the scrotum. Was seen by general surgery a and plans are in place to attempt a combined incision and drainage procedure for him. He remains afebrile with a trending down white blood cell count.  Objective: Vital signs in last 24 hours: Temp:  [98 F (36.7 C)-99.6 F (37.6 C)] 98 F (36.7 C) (11/08 1033) Pulse Rate:  [88-95] 88 (11/08 1033) Resp:  [18] 18 (11/08 1033) BP: (119-135)/(82-98) 135/98 mmHg (11/08 1033) SpO2:  [96 %-100 %] 98 % (11/08 1033) Weight:  [100.472 kg (221 lb 8 oz)] 100.472 kg (221 lb 8 oz) (11/07 2100)  Intake/Output from previous day: 11/07 0701 - 11/08 0700 In: 120 [P.O.:120] Out: -  Intake/Output this shift:    Physical Exam:  Constitutional: Vital signs reviewed. WD WN in NAD   Eyes: PERRL, No scleral icterus.   Genitourinary:mildly edematous scrotum with area of induration and fluctuance. Small inferior aspect with purulent drainage. Extremities: No cyanosis or edema   Lab Results:  Recent Labs  02/15/14 0245 02/16/14 0459 02/17/14 0748  HGB 11.4* 11.3* 9.9*  HCT 33.5* 33.8* 29.9*   BMET  Recent Labs  02/16/14 0459 02/17/14 0748  NA 132* 133*  K 4.8 4.7  CL 95* 97  CO2 24 26  GLUCOSE 167* 234*  BUN 16 12  CREATININE 1.04 1.04  CALCIUM 8.8 8.6   No results for input(s): LABPT, INR in the last 72 hours. No results for input(s): LABURIN in the last 72 hours. Results for orders placed or performed during the hospital encounter of 10/31/13  Culture, blood (routine x 2)     Status: None   Collection Time: 10/31/13 11:43 AM  Result Value Ref Range Status   Specimen Description BLOOD LEFT ARM  Final   Special Requests BOTTLES DRAWN AEROBIC AND ANAEROBIC [facility]  Final   Culture  Setup Time   Final    10/31/2013 14:01 Performed at [facility]   Culture   Final    NO GROWTH 5 DAYS Performed at [facility]    Report Status 11/06/2013 FINAL  Final  Culture, blood (routine x 2)     Status: None   Collection Time: 10/31/13 11:45 AM  Result Value Ref Range Status   Specimen Description BLOOD BLOOD RIGHT FOREARM  Final   Special Requests BOTTLES DRAWN AEROBIC AND ANAEROBIC [facility]  Final   Culture  Setup Time   Final    10/31/2013 14:01 Performed at [facility]   Culture   Final    NO GROWTH 5 DAYS Performed at [facility]   Report Status 11/06/2013 FINAL  Final  Wound culture     Status: None   Collection Time: 10/31/13  5:56 PM  Result Value Ref Range Status   Specimen Description WOUND TESTICLE LEFT  Final   Special Requests NONE  Final   Gram Stain   Final    MODERATE WBC PRESENT,BOTH PMN AND MONONUCLEAR NO SQUAMOUS EPITHELIAL CELLS SEEN NO ORGANISMS SEEN Performed at [facility]   Culture   Final    NO GROWTH 2 DAYS Performed at [facility]   Report Status 11/02/2013 FINAL  Final    Studies/Results: No results found.  Assessment/Plan:   Hidradenitis with skin abscess and scrotal abscess. Gen. Surgery would like to take him to surgery for incision and drainage. [name] has an area on his scrotum that  is draining with fluctuance and a probable abscess cavity. While this has already decompressed I do think more formal opening of the cavity with packing would be in his benefit. This procedure risks and benefits were discussed with him by Dr. Alinda Money yesterday and reiterated by myself today.   LOS: 2 days   Chavy Avera S 02/17/2014, 10:44 AM

## 2014-02-17 NOTE — Plan of Care (Signed)
Problem: Phase I Progression Outcomes Goal: Other Phase I Outcomes/Goals Outcome: Completed/Met Date Met:  02/17/14  Problem: Phase II Progression Outcomes Goal: Pain controlled Outcome: Completed/Met Date Met:  02/17/14 Goal: Progress activity as tolerated unless otherwise ordered Outcome: Completed/Met Date Met:  02/17/14 Goal: Foley discontinued Outcome: Not Applicable Date Met:  70/26/37 Goal: Tolerating diet Outcome: Completed/Met Date Met:  02/17/14  Problem: Phase III Progression Outcomes Goal: Nasogastric tube discontinued Outcome: Not Applicable Date Met:  85/88/50

## 2014-02-17 NOTE — Progress Notes (Signed)
Patient ID: Austin Alexander, male   DOB: 1970/08/08, 43 y.o.   MRN: [id number]   no new changes Still with discomfort and drainage  From gen surg standpoint, there is abscess along the left gluteal/perianal area that needs I and D in conjuction with urology who is evaluating the scrotum  Waiting for their input before scheduling today and pending OR availability

## 2014-02-17 NOTE — Op Note (Signed)
IRRIGATION AND DEBRIDEMENT PERIRECTAL  AND SCROTAL ABSCESS  Procedure Note  Austin Alexander 02/15/2014 - 02/17/2014   Pre-op Diagnosis: hydrandenitis     Post-op Diagnosis: same  Procedure(s): IRRIGATION AND DEBRIDEMENT PERIRECTAL  AND SCROTAL ABSCESS  Surgeon(s): Coralie Keens, MD Bernestine Amass, MD  Anesthesia: General  Staff:  Circulator: Natalia Leatherwood, RN Scrub Person: Delman Cheadle, CST  Estimated Blood Loss: Minimal                         Navya Timmons A   Date: 02/17/2014  Time: 1:30 PM

## 2014-02-17 NOTE — Plan of Care (Signed)
Problem: Phase I Progression Outcomes Goal: Tubes/drains patent Outcome: Not Applicable Date Met:  22/44/97

## 2014-02-17 NOTE — Plan of Care (Signed)
Problem: Phase I Progression Outcomes Goal: Pain controlled with appropriate interventions Outcome: Completed/Met Date Met:  02/17/14     

## 2014-02-18 LAB — CBC
HCT: 28.7 % — ABNORMAL LOW (ref 39.0–52.0)
Hemoglobin: 9.3 g/dL — ABNORMAL LOW (ref 13.0–17.0)
MCH: 30.2 pg (ref 26.0–34.0)
MCHC: 32.4 g/dL (ref 30.0–36.0)
MCV: 93.2 fL (ref 78.0–100.0)
Platelets: 332 10*3/uL (ref 150–400)
RBC: 3.08 MIL/uL — ABNORMAL LOW (ref 4.22–5.81)
RDW: 14.7 % (ref 11.5–15.5)
WBC: 9.7 10*3/uL (ref 4.0–10.5)

## 2014-02-18 LAB — BASIC METABOLIC PANEL
Anion gap: 13 (ref 5–15)
BUN: 16 mg/dL (ref 6–23)
CHLORIDE: 96 meq/L (ref 96–112)
CO2: 23 mEq/L (ref 19–32)
Calcium: 8.4 mg/dL (ref 8.4–10.5)
Creatinine, Ser: 1.38 mg/dL — ABNORMAL HIGH (ref 0.50–1.35)
GFR calc non Af Amer: 60 mL/min — ABNORMAL LOW (ref >90)
GFR, EST AFRICAN AMERICAN: 70 mL/min — AB (ref >90)
Glucose, Bld: 194 mg/dL — ABNORMAL HIGH (ref 70–99)
Potassium: 5.2 mEq/L (ref 3.7–5.3)
Sodium: 132 mEq/L — ABNORMAL LOW (ref 137–147)

## 2014-02-18 LAB — GLUCOSE, CAPILLARY
Glucose-Capillary: 227 mg/dL — ABNORMAL HIGH (ref 70–99)
Glucose-Capillary: 241 mg/dL — ABNORMAL HIGH (ref 70–99)
Glucose-Capillary: 141 mg/dL — ABNORMAL HIGH (ref 70–99)
Glucose-Capillary: 222 mg/dL — ABNORMAL HIGH (ref 70–99)

## 2014-02-18 LAB — VANCOMYCIN, TROUGH: Vancomycin Tr: 21.2 ug/mL — ABNORMAL HIGH (ref 10.0–20.0)

## 2014-02-18 MED ORDER — VANCOMYCIN HCL IN DEXTROSE 750-5 MG/150ML-% IV SOLN
750.0000 mg | Freq: Two times a day (BID) | INTRAVENOUS | Status: DC
  Administered 2014-02-19 – 2014-02-20 (×3): 750 mg via INTRAVENOUS
  Filled 2014-02-18 (×4): qty 150

## 2014-02-18 MED ORDER — INSULIN ASPART 100 UNIT/ML ~~LOC~~ SOLN
0.0000 [IU] | Freq: Three times a day (TID) | SUBCUTANEOUS | Status: DC
  Administered 2014-02-18: 5 [IU] via SUBCUTANEOUS
  Administered 2014-02-18 – 2014-02-19 (×2): 2 [IU] via SUBCUTANEOUS
  Administered 2014-02-19: 3 [IU] via SUBCUTANEOUS
  Administered 2014-02-19: 5 [IU] via SUBCUTANEOUS
  Administered 2014-02-20 (×2): 2 [IU] via SUBCUTANEOUS

## 2014-02-18 MED ORDER — INSULIN GLARGINE 100 UNIT/ML ~~LOC~~ SOLN
5.0000 [IU] | Freq: Every day | SUBCUTANEOUS | Status: DC
  Administered 2014-02-18: 5 [IU] via SUBCUTANEOUS
  Filled 2014-02-18 (×2): qty 0.05

## 2014-02-18 MED ORDER — INSULIN ASPART 100 UNIT/ML ~~LOC~~ SOLN
0.0000 [IU] | Freq: Every day | SUBCUTANEOUS | Status: DC
  Administered 2014-02-18: 2 [IU] via SUBCUTANEOUS

## 2014-02-18 NOTE — Progress Notes (Signed)
Inpatient Diabetes Program Recommendations  AACE/ADA: New Consensus Statement on Inpatient Glycemic Control (2013)  Target Ranges:  Prepandial:   less than 140 mg/dL      Peak postprandial:   less than 180 mg/dL (1-2 hours)      Critically ill patients:  140 - 180 mg/dL  Results for Austin Alexander, Austin Alexander (MRN [id number]) as of 02/18/2014 10:35  Ref. Range 02/17/2014 08:02 02/17/2014 11:26 02/17/2014 13:58 02/17/2014 16:37 02/17/2014 21:40 02/18/2014 07:39  Glucose-Capillary Latest Range: 70-99 mg/dL 250 (H) 159 (H) 134 (H) 170 (H) 172 (H) 227 (H)   Results for Austin Alexander, Austin Alexander (MRN [id number]) as of 02/18/2014 10:35  Ref. Range 09/22/2013 16:58  Hgb A1c MFr Bld Latest Range: <5.7 % 9.4 (H)   Diabetes history: DM2 Outpatient Diabetes medications: Metformin 500 mg BID Current orders for Inpatient glycemic control: Novolog 0-9 units AC  Inpatient Diabetes Program Recommendations Insulin - Basal: Please consider ordering low dose basal insulin; recommend starting with Levemir 10 units QHS (based on 100 kg x 0.1 units). HgbA1C: Last A1C was 9.4% on 09/22/13. Please consider ordering an A1C to evaluate glycemic control over the past 2-3 months.  Thanks, Barnie Alderman, RN, MSN, CCRN, CDE Diabetes Coordinator Inpatient Diabetes Program [phone] (Team Pager)  [phone] ([facility] office) [phone] [facility] office)

## 2014-02-18 NOTE — Progress Notes (Signed)
Patient ID: Austin Alexander  male  [id number]    DOB: February 12, 1971    DOA: 02/15/2014  PCP: No PCP Per Patient   Brief history of present illness  Patient is a 43 year old male with history of diabetes, hypertension, hyperlipidemia, cardiomyopathy, chronic folliculitis who presented with scrotal swelling and pain that started 3 days ago prior to admission and has been getting progressively worse. In the ER patient was found to have a draining abscess from his scrotum and inner buttock. CT scan showed a scrotal and gluteal abscess. General surgery and urology was consulted.  Patient underwent incision and drainage of multiple perianal and perineal abscess  Assessment/Plan: Principal Problem:   Gluteal Scrotal wall abscess: Postop day 1 - continue pain control, IV vancomycin and Zosyn - status post incision and drainage of multiple perianal and perineal abscess, follow cultures - Management per surgery and urology  Active Problems:   HTN (hypertension) - currently stable continue ACEI, Coreg, spironolactone    Uncontrolled diabetes mellitus - Continue sliding scale insulin  Alcohol abuse - Continue CIWA protocol with Ativan    Tobacco abuse - Continue nicotine patch    Cardiomyopathy - Currently stable, no cardiac symptoms, continue Coreg  DVT Prophylaxis:Lovenox  Code Status:full code  Family Communication:  Disposition:  Consultants:  Gen. Surgery  Urology  Procedures:  CT pelvis  Antibiotics:  IV vancomycin  IV Zosyn  Subjective: Patient seen and examined,Feels better today, no complaints, no fevers  Objective: Weight change: 0 kg (0 lb)  Intake/Output Summary (Last 24 hours) at 02/18/14 1040 Last data filed at 02/18/14 0956  Gross per 24 hour  Intake   2645 ml  Output      0 ml  Net   2645 ml   Blood pressure 122/82, pulse 84, temperature 98.5 F (36.9 C), temperature source Oral, resp. rate 18, height 5\' 11"  (1.803 m), weight 100.472 kg  (221 lb 8 oz), SpO2 100 %.  Physical Exam: General: Alert and awake, oriented x3, NAD CVS: S1-S2 clear, no murmur rubs or gallops Chest: CTAB Abdomen: soft NT, ND, NBS  Extremities: no cyanosis, clubbing or edema noted bilaterally GU: dressing intact   Lab Results: Basic Metabolic Panel:  Recent Labs Lab 02/17/14 0748 02/18/14 0607  NA 133* 132*  K 4.7 5.2  CL 97 96  CO2 26 23  GLUCOSE 234* 194*  BUN 12 16  CREATININE 1.04 1.38*  CALCIUM 8.6 8.4   Liver Function Tests:  Recent Labs Lab 02/15/14 0245 02/16/14 0459  AST 13 9  ALT 11 8  ALKPHOS 128* 132*  BILITOT 0.4 0.5  PROT 8.2 7.7  ALBUMIN 2.4* 2.1*   No results for input(s): LIPASE, AMYLASE in the last 168 hours. No results for input(s): AMMONIA in the last 168 hours. CBC:  Recent Labs Lab 02/15/14 0245  02/17/14 0748 02/18/14 0607  WBC 13.2*  < > 12.6* 9.7  NEUTROABS 10.9*  --   --   --   HGB 11.4*  < > 9.9* 9.3*  HCT 33.5*  < > 29.9* 28.7*  MCV 88.6  < > 90.9 93.2  PLT 440*  < > 409* 332  < > = values in this interval not displayed. Cardiac Enzymes: No results for input(s): CKTOTAL, CKMB, CKMBINDEX, TROPONINI in the last 168 hours. BNP: Invalid input(s): POCBNP CBG:  Recent Labs Lab 02/17/14 1126 02/17/14 1358 02/17/14 1637 02/17/14 2140 02/18/14 0739  GLUCAP 159* 134* 170* 172* 227*     Micro Results: Recent  Results (from the past 240 hour(s))  Blood culture (routine x 2)     Status: None (Preliminary result)   Collection Time: 02/15/14  7:45 AM  Result Value Ref Range Status   Specimen Description BLOOD LEFT FOREARM  Final   Special Requests BOTTLES DRAWN AEROBIC AND ANAEROBIC 5[facility]  Final   Culture  Setup Time   Final    02/15/2014 15:41 Performed at [facility]    Culture   Final           BLOOD CULTURE RECEIVED NO GROWTH TO DATE CULTURE WILL BE HELD FOR 5 DAYS BEFORE ISSUING A FINAL NEGATIVE REPORT Performed at [facility]    Report Status PENDING   Incomplete  Blood culture (routine x 2)     Status: None (Preliminary result)   Collection Time: 02/15/14  8:00 AM  Result Value Ref Range Status   Specimen Description BLOOD RIGHT ARM  Final   Special Requests BOTTLES DRAWN AEROBIC AND ANAEROBIC 10CC  Final   Culture  Setup Time   Final    02/15/2014 15:41 Performed at [facility]    Culture   Final           BLOOD CULTURE RECEIVED NO GROWTH TO DATE CULTURE WILL BE HELD FOR 5 DAYS BEFORE ISSUING A FINAL NEGATIVE REPORT Performed at [facility]    Report Status PENDING  Incomplete  Surgical pcr screen     Status: None   Collection Time: 02/17/14 11:06 AM  Result Value Ref Range Status   MRSA, PCR NEGATIVE NEGATIVE Final   Staphylococcus aureus NEGATIVE NEGATIVE Final    Comment:        The Xpert SA Assay (FDA approved for NASAL specimens in patients over 48 years of age), is one component of a comprehensive surveillance program.  Test performance has been validated by [employer] for patients greater than or equal to 43 year old. It is not intended to diagnose infection nor to guide or monitor treatment.   Anaerobic culture     Status: None (Preliminary result)   Collection Time: 02/17/14  1:50 PM  Result Value Ref Range Status   Specimen Description TISSUE SCROTUM  Final   Special Requests PATIENT ON FOLLOWING VANCOMYCIN ZOSYN  Final   Gram Stain   Final    FEW WBC PRESENT,BOTH PMN AND MONONUCLEAR NO SQUAMOUS EPITHELIAL CELLS SEEN NO ORGANISMS SEEN Performed at [facility]    Culture PENDING  Incomplete   Report Status PENDING  Incomplete  Anaerobic culture     Status: None (Preliminary result)   Collection Time: 02/17/14  1:50 PM  Result Value Ref Range Status   Specimen Description ABSCESS SCROTUM  Final   Special Requests POF VANCOMYCIN ZOSYN  Final   Gram Stain   Final    FEW WBC PRESENT,BOTH PMN AND MONONUCLEAR NO SQUAMOUS EPITHELIAL CELLS SEEN NO ORGANISMS SEEN Performed at  [facility]    Culture PENDING  Incomplete   Report Status PENDING  Incomplete  Tissue culture     Status: None (Preliminary result)   Collection Time: 02/17/14  1:50 PM  Result Value Ref Range Status   Specimen Description TISSUE SCROTUM  Final   Special Requests PATIENT ON FOLLOWING VANCOMYCIN ZOSYN  Final   Gram Stain   Final    FEW WBC PRESENT,BOTH PMN AND MONONUCLEAR NO SQUAMOUS EPITHELIAL CELLS SEEN NO ORGANISMS SEEN Performed at [facility]    Culture NO GROWTH  Performed at [facility]   Final   Report Status PENDING  Incomplete  Culture, routine-abscess     Status: None (Preliminary result)   Collection Time: 02/17/14  1:50 PM  Result Value Ref Range Status   Specimen Description ABSCESS SCROTUM  Final   Special Requests PATIENT ON FOLLOWING VANCOMYCIN ZOSYN  Final   Gram Stain   Final    ABUNDANT WBC PRESENT,BOTH PMN AND MONONUCLEAR NO SQUAMOUS EPITHELIAL CELLS SEEN NO ORGANISMS SEEN Performed at [facility]    Culture NO GROWTH Performed at [facility]   Final   Report Status PENDING  Incomplete    Studies/Results: Ct Pelvis W Contrast  02/15/2014   CLINICAL DATA:  Bilateral testicular swelling and pain for 2 days. No known injury. No dysuria. No fever or chills.  EXAM: CT PELVIS WITH CONTRAST  TECHNIQUE: Multidetector CT imaging of the pelvis was performed using the standard protocol following the bolus administration of intravenous contrast.  CONTRAST:  146mL OMNIPAQUE IOHEXOL 300 MG/ML SOLN 100 cc Omnipaque 300  COMPARISON:  10/31/2013  FINDINGS: There is diffuse scrotal wall edema bilaterally. Just to the left of midline along the inferior aspect of the scrotum there is an irregular hypoechoic collection with rim enhancement. This measures 1.6 x 4.4 x 4.9 cm and is slightly larger when compared with the prior study, suspicious for abscess. There is heterogeneous appearance along the inferior aspect of the left  hemiscrotum, suspicious for additional complex fluid collection. There is more simple appearing fluid along the dependent aspect of the right hemiscrotum, possibly representing hydrocele versus edema.  There is bilateral inguinal adenopathy, largest node on the right measuring 3.1 cm. Largest node on the left measures 2.2 cm. Dependent edema identified in the buttocks bilaterally. To the right of midline in the gluteal cleft there is question of skin edema, skin breakdown, or possible abscess. This heterogeneous collection measures 2.4 x 5.3 cm. Correlation with physical exam findings recommended.  Urinary bladder has a normal appearance. There are calcifications of the seminal vesicles. Visualized bowel loops are unremarkable in appearance. There are degenerative changes in the lower lumbar spine.  IMPRESSION: 1. Focal collection along the inferior aspect of the scrotum suspicious for abscess. As CT cannot assess integrity of the blood flow in the testicles, further characterization with scrotal ultrasound and Doppler recommended. 2. Hydrocele versus edema involving the dependent aspect of the right hemiscrotum. 3. Bilateral inguinal adenopathy. 4. Question right gluteal cleft abscess, skin thickening, or breakdown. 5. These results will be called to the ordering clinician or representative by the Radiologist Assistant, and communication documented in the PACS or zVision Dashboard.   Electronically Signed   By: Shon Hale M.D.   On: 02/15/2014 08:33    Medications: Scheduled Meds: . aspirin EC  81 mg Oral Daily  . carvedilol  25 mg Oral BID WC  . enoxaparin (LOVENOX) injection  50 mg Subcutaneous Q24H  . folic acid  1 mg Oral Daily  . insulin aspart  0-9 Units Subcutaneous TID WC  . lisinopril  20 mg Oral Daily  . multivitamin with minerals  1 tablet Oral Daily  . piperacillin-tazobactam (ZOSYN)  IV  3.375 g Intravenous Q8H  . simvastatin  40 mg Oral Daily  . spironolactone  25 mg Oral Daily  .  thiamine  100 mg Oral Daily   Or  . thiamine  100 mg Intravenous Daily  . vancomycin  750 mg Intravenous 3 times per day  LOS: 3 days   Giannah Zavadil M.D. [facility] 02/18/2014, 10:40 AM Pager: [phone]  If 7PM-7AM, please contact night-coverage [email or web address] Password [email or web address]

## 2014-02-18 NOTE — Progress Notes (Signed)
ANTIBIOTIC CONSULT NOTE - FOLLOW UP  Pharmacy Consult for Vancomycin and Zosyn Indication: scrotal abscess/cellulitis  No Known Allergies  Patient Measurements: Height: 5\' 11"  (180.3 cm) Weight: 221 lb 8 oz (100.472 kg) IBW/kg (Calculated) : 75.3   Vital Signs: Temp: 98.7 F (37.1 C) (11/09 1800) Temp Source: Oral (11/09 1800) BP: 134/92 mmHg (11/09 1800) Pulse Rate: 86 (11/09 1800) Intake/Output from previous day: 11/08 0701 - 11/09 0700 In: 2622.5 [P.O.:240; I.V.:1732.5; IV Piggyback:650] Out: 0  Intake/Output from this shift: Total I/O In: 150 [IV Piggyback:150] Out: -   Labs:  Recent Labs  02/16/14 0459 02/17/14 0748 02/18/14 0607  WBC 15.0* 12.6* 9.7  HGB 11.3* 9.9* 9.3*  PLT 409* 409* 332  CREATININE 1.04 1.04 1.38*   Estimated Creatinine Clearance: 81.7 mL/min (by C-G formula based on Cr of 1.38).  Recent Labs  02/17/14 1130 02/18/14 1901  VANCOTROUGH 13.3 21.2*     Microbiology: Recent Results (from the past 720 hour(s))  Blood culture (routine x 2)     Status: None (Preliminary result)   Collection Time: 02/15/14  7:45 AM  Result Value Ref Range Status   Specimen Description BLOOD LEFT FOREARM  Final   Special Requests BOTTLES DRAWN AEROBIC AND ANAEROBIC 5CC  Final   Culture  Setup Time   Final    02/15/2014 15:41 Performed at [facility]    Culture   Final           BLOOD CULTURE RECEIVED NO GROWTH TO DATE CULTURE WILL BE HELD FOR 5 DAYS BEFORE ISSUING A FINAL NEGATIVE REPORT Performed at [facility]    Report Status PENDING  Incomplete  Blood culture (routine x 2)     Status: None (Preliminary result)   Collection Time: 02/15/14  8:00 AM  Result Value Ref Range Status   Specimen Description BLOOD RIGHT ARM  Final   Special Requests BOTTLES DRAWN AEROBIC AND ANAEROBIC 10CC  Final   Culture  Setup Time   Final    02/15/2014 15:41 Performed at [facility]    Culture   Final           BLOOD CULTURE RECEIVED  NO GROWTH TO DATE CULTURE WILL BE HELD FOR 5 DAYS BEFORE ISSUING A FINAL NEGATIVE REPORT Performed at [facility]    Report Status PENDING  Incomplete  Surgical pcr screen     Status: None   Collection Time: 02/17/14 11:06 AM  Result Value Ref Range Status   MRSA, PCR NEGATIVE NEGATIVE Final   Staphylococcus aureus NEGATIVE NEGATIVE Final    Comment:        The Xpert SA Assay (FDA approved for NASAL specimens in patients over 66 years of age), is one component of a comprehensive surveillance program.  Test performance has been validated by [employer] for patients greater than or equal to 66 year old. It is not intended to diagnose infection nor to guide or monitor treatment.   Anaerobic culture     Status: None (Preliminary result)   Collection Time: 02/17/14  1:50 PM  Result Value Ref Range Status   Specimen Description TISSUE SCROTUM  Final   Special Requests PATIENT ON FOLLOWING VANCOMYCIN ZOSYN  Final   Gram Stain   Final    FEW WBC PRESENT,BOTH PMN AND MONONUCLEAR NO SQUAMOUS EPITHELIAL CELLS SEEN NO ORGANISMS SEEN Performed at [facility]    Culture PENDING  Incomplete   Report Status PENDING  Incomplete  Anaerobic culture  Status: None (Preliminary result)   Collection Time: 02/17/14  1:50 PM  Result Value Ref Range Status   Specimen Description ABSCESS SCROTUM  Final   Special Requests POF VANCOMYCIN ZOSYN  Final   Gram Stain   Final    FEW WBC PRESENT,BOTH PMN AND MONONUCLEAR NO SQUAMOUS EPITHELIAL CELLS SEEN NO ORGANISMS SEEN Performed at [facility]    Culture   Final    NO ANAEROBES ISOLATED; CULTURE IN PROGRESS FOR 5 DAYS Performed at [facility]    Report Status PENDING  Incomplete  Tissue culture     Status: None (Preliminary result)   Collection Time: 02/17/14  1:50 PM  Result Value Ref Range Status   Specimen Description TISSUE SCROTUM  Final   Special Requests PATIENT ON FOLLOWING VANCOMYCIN ZOSYN  Final    Gram Stain   Final    FEW WBC PRESENT,BOTH PMN AND MONONUCLEAR NO SQUAMOUS EPITHELIAL CELLS SEEN NO ORGANISMS SEEN Performed at [facility]    Culture NO GROWTH Performed at [facility]   Final   Report Status PENDING  Incomplete  Culture, routine-abscess     Status: None (Preliminary result)   Collection Time: 02/17/14  1:50 PM  Result Value Ref Range Status   Specimen Description ABSCESS SCROTUM  Final   Special Requests PATIENT ON FOLLOWING VANCOMYCIN ZOSYN  Final   Gram Stain   Final    ABUNDANT WBC PRESENT,BOTH PMN AND MONONUCLEAR NO SQUAMOUS EPITHELIAL CELLS SEEN NO ORGANISMS SEEN Performed at [facility]    Culture NO GROWTH Performed at [facility]   Final   Report Status PENDING  Incomplete    Anti-infectives    Start     Dose/Rate Route Frequency Ordered Stop   02/15/14 1600  piperacillin-tazobactam (ZOSYN) IVPB 3.375 g     3.375 g12.5 mL/hr over 240 Minutes Intravenous Every 8 hours 02/15/14 1508     02/15/14 0700  piperacillin-tazobactam (ZOSYN) IVPB 3.375 g  Status:  Discontinued     3.375 g12.5 mL/hr over 240 Minutes Intravenous  Once 02/15/14 0649 02/15/14 0655   02/15/14 0700  piperacillin-tazobactam (ZOSYN) IVPB 3.375 g     3.375 g100 mL/hr over 30 Minutes Intravenous  Once 02/15/14 0655 02/15/14 0843   02/15/14 0700  vancomycin (VANCOCIN) IVPB 750 mg/150 ml premix     750 mg150 mL/hr over 60 Minutes Intravenous 3 times per day 02/15/14 [id number]        Assessment: 11 YOM with scrotal abscess/cellulitis s/p I&D on 11/8 who continues on Vancomycin + Zosyn for empiric coverage. A Vancomycin trough was checked this evening in the setting of a bump in SCr to evaluate for accumulation and resulted as SUPRAtherapeutic (Vanc trough 21.2 mcg/ml, goal of 10-15 mcg/ml). The patient's SCr bumped up to 1.38 << 1.04, estimated CrCl~65-70 ml/min (normalized).  The RN had already hung the Vancomycin dose after the trough was taken this  evening. ~1/2 of the dose was given and then the RN was instructed to hold the remainder of the dose - it can be estimated the patient received ~375mg  of Vancomycin this evening prior to stopping.   Goal of Therapy:  Vancomycin trough level 10-15 mcg/ml  Plan:  1. Adjust Vancomycin to 750 mg IV every 12 hours (next dose at 1000 on 11/10) 2. Continue Zosyn 3.375g IV every 8 hours (infused over 4 hours) 3. Will continue to follow renal function, culture results, LOT, and antibiotic de-escalation plans   Alycia Rossetti, PharmD,  BCPS Clinical Pharmacist Pager: [phone] 02/18/2014 8:14 PM

## 2014-02-18 NOTE — Progress Notes (Signed)
Urology Progress Note  1 Day Post-Op   Subjective: hidradenitis     No acute urologic events overnight. Ambulation:   negative Flatus:    positive Bowel movement  positive  Pain: some relief  Objective:  Blood pressure 134/92, pulse 86, temperature 98.7 F (37.1 C), temperature source Oral, resp. rate 20, height 5\' 11"  (1.803 m), weight 100.472 kg (221 lb 8 oz), SpO2 100 %.  Physical Exam:  General:  No acute distress, awake  Genitourinary:  Packing in place. For change per nursing staff tomorrow.  Foley: out.    I/O last 3 completed shifts: In: 5151.3 [P.O.:1560; I.V.:2691.3; IV Piggyback:900] Out: 0   Recent Labs     02/17/14  0748  02/18/14  0607  HGB  9.9*  9.3*  WBC  12.6*  9.7  PLT  409*  332    Recent Labs     02/17/14  0748  02/18/14  0607  NA  133*  132*  K  4.7  5.2  CL  97  96  CO2  26  23  BUN  12  16  CREATININE  1.04  1.38*  CALCIUM  8.6  8.4  GFRNONAA  85*  60*  GFRAA  >90  70*     No results for input(s): INR, APTT in the last 72 hours.  Invalid input(s): PT   Invalid input(s): ABG  Assessment/Plan:  Continue any current medications.

## 2014-02-18 NOTE — Consult Note (Addendum)
WOC Consult: [facility] following for assessment and plan of care for scrotum post-op wound. Discussed patient with Saverio Danker, PA and she has discontinued the [facility] consult; their team will plan to follow. Please re-consult if further assistance is needed.  Thank-you,  Julien Girt MSN, [place], [place], [place], [place]

## 2014-02-18 NOTE — Progress Notes (Signed)
ANTIBIOTIC CONSULT NOTE - FOLLOW UP  Pharmacy Consult for vancomycin and Zosyn Indication: scrotal abscess/cellulitis  No Known Allergies  Patient Measurements: Height: 5\' 11"  (180.3 cm) Weight: 221 lb 8 oz (100.472 kg) IBW/kg (Calculated) : 75.3   Vital Signs: Temp: 98.5 F (36.9 C) (11/09 0946) Temp Source: Oral (11/09 0946) BP: 122/82 mmHg (11/09 0946) Pulse Rate: 84 (11/09 0946) Intake/Output from previous day: 11/08 0701 - 11/09 0700 In: 2005 [P.O.:240; I.V.:1115; IV Piggyback:650] Out: 0  Intake/Output from this shift: Total I/O In: 1040 [P.O.:840; IV Piggyback:200] Out: -   Labs:  Recent Labs  02/16/14 0459 02/17/14 0748 02/18/14 0607  WBC 15.0* 12.6* 9.7  HGB 11.3* 9.9* 9.3*  PLT 409* 409* 332  CREATININE 1.04 1.04 1.38*   Estimated Creatinine Clearance: 81.7 mL/min (by C-G formula based on Cr of 1.38).  Recent Labs  02/17/14 1130  VANCOTROUGH 13.3     Microbiology: Recent Results (from the past 720 hour(s))  Blood culture (routine x 2)     Status: None (Preliminary result)   Collection Time: 02/15/14  7:45 AM  Result Value Ref Range Status   Specimen Description BLOOD LEFT FOREARM  Final   Special Requests BOTTLES DRAWN AEROBIC AND ANAEROBIC 5CC  Final   Culture  Setup Time   Final    02/15/2014 15:41 Performed at [facility]    Culture   Final           BLOOD CULTURE RECEIVED NO GROWTH TO DATE CULTURE WILL BE HELD FOR 5 DAYS BEFORE ISSUING A FINAL NEGATIVE REPORT Performed at [facility]    Report Status PENDING  Incomplete  Blood culture (routine x 2)     Status: None (Preliminary result)   Collection Time: 02/15/14  8:00 AM  Result Value Ref Range Status   Specimen Description BLOOD RIGHT ARM  Final   Special Requests BOTTLES DRAWN AEROBIC AND ANAEROBIC 10CC  Final   Culture  Setup Time   Final    02/15/2014 15:41 Performed at [facility]    Culture   Final           BLOOD CULTURE RECEIVED NO GROWTH TO  DATE CULTURE WILL BE HELD FOR 5 DAYS BEFORE ISSUING A FINAL NEGATIVE REPORT Performed at [facility]    Report Status PENDING  Incomplete  Surgical pcr screen     Status: None   Collection Time: 02/17/14 11:06 AM  Result Value Ref Range Status   MRSA, PCR NEGATIVE NEGATIVE Final   Staphylococcus aureus NEGATIVE NEGATIVE Final    Comment:        The Xpert SA Assay (FDA approved for NASAL specimens in patients over 53 years of age), is one component of a comprehensive surveillance program.  Test performance has been validated by [employer] for patients greater than or equal to 28 year old. It is not intended to diagnose infection nor to guide or monitor treatment.   Anaerobic culture     Status: None (Preliminary result)   Collection Time: 02/17/14  1:50 PM  Result Value Ref Range Status   Specimen Description TISSUE SCROTUM  Final   Special Requests PATIENT ON FOLLOWING VANCOMYCIN ZOSYN  Final   Gram Stain   Final    FEW WBC PRESENT,BOTH PMN AND MONONUCLEAR NO SQUAMOUS EPITHELIAL CELLS SEEN NO ORGANISMS SEEN Performed at [facility]    Culture PENDING  Incomplete   Report Status PENDING  Incomplete  Anaerobic culture  Status: None (Preliminary result)   Collection Time: 02/17/14  1:50 PM  Result Value Ref Range Status   Specimen Description ABSCESS SCROTUM  Final   Special Requests POF VANCOMYCIN ZOSYN  Final   Gram Stain   Final    FEW WBC PRESENT,BOTH PMN AND MONONUCLEAR NO SQUAMOUS EPITHELIAL CELLS SEEN NO ORGANISMS SEEN Performed at [facility]    Culture   Final    NO ANAEROBES ISOLATED; CULTURE IN PROGRESS FOR 5 DAYS Performed at [facility]    Report Status PENDING  Incomplete  Tissue culture     Status: None (Preliminary result)   Collection Time: 02/17/14  1:50 PM  Result Value Ref Range Status   Specimen Description TISSUE SCROTUM  Final   Special Requests PATIENT ON FOLLOWING VANCOMYCIN ZOSYN  Final   Gram  Stain   Final    FEW WBC PRESENT,BOTH PMN AND MONONUCLEAR NO SQUAMOUS EPITHELIAL CELLS SEEN NO ORGANISMS SEEN Performed at [facility]    Culture NO GROWTH Performed at [facility]   Final   Report Status PENDING  Incomplete  Culture, routine-abscess     Status: None (Preliminary result)   Collection Time: 02/17/14  1:50 PM  Result Value Ref Range Status   Specimen Description ABSCESS SCROTUM  Final   Special Requests PATIENT ON FOLLOWING VANCOMYCIN ZOSYN  Final   Gram Stain   Final    ABUNDANT WBC PRESENT,BOTH PMN AND MONONUCLEAR NO SQUAMOUS EPITHELIAL CELLS SEEN NO ORGANISMS SEEN Performed at [facility]    Culture NO GROWTH Performed at [facility]   Final   Report Status PENDING  Incomplete    Anti-infectives    Start     Dose/Rate Route Frequency Ordered Stop   02/15/14 1600  piperacillin-tazobactam (ZOSYN) IVPB 3.375 g     3.375 g12.5 mL/hr over 240 Minutes Intravenous Every 8 hours 02/15/14 1508     02/15/14 0700  piperacillin-tazobactam (ZOSYN) IVPB 3.375 g  Status:  Discontinued     3.375 g12.5 mL/hr over 240 Minutes Intravenous  Once 02/15/14 0649 02/15/14 0655   02/15/14 0700  piperacillin-tazobactam (ZOSYN) IVPB 3.375 g     3.375 g100 mL/hr over 30 Minutes Intravenous  Once 02/15/14 0655 02/15/14 0843   02/15/14 0700  vancomycin (VANCOCIN) IVPB 750 mg/150 ml premix     750 mg150 mL/hr over 60 Minutes Intravenous 3 times per day 02/15/14 [id number]        Assessment: 43 yo male with scrotal abscess/cellulitis s/p I&D yesterday. A VT obtained yesterday was 13.57mcg/mL. SCr today increased to 1.38 from 1.04.  WBC nml, afebrile.  Goal of Therapy:  Vancomycin trough level 10-15 mcg/ml  Plan:  1. continue vancomycin at 750mg  IV q8h-- obtain another trough at 1930 tonight to ensure patient is not accumulating 2. Continue Zosyn 3.375g IV q8h EI 3. Follow renal function, clinical progression, LOT, de-escalation opportunities  Yenty Bloch  D. Lekha Dancer, PharmD, BCPS Clinical Pharmacist Pager: [phone] 02/18/2014 3:08 PM

## 2014-02-18 NOTE — Progress Notes (Signed)
1 Day Post-Op  Subjective: Pt seen mid-day. No signif c/o other than drainage . No n/v. Reports urination  Objective: Vital signs in last 24 hours: Temp:  [98.5 F (36.9 C)-98.7 F (37.1 C)] 98.6 F (37 C) (11/09 2126) Pulse Rate:  [84-91] 91 (11/09 2126) Resp:  [18-20] 18 (11/09 2126) BP: (122-134)/(82-92) 126/87 mmHg (11/09 2126) SpO2:  [100 %] 100 % (11/09 2126) Weight:  [220 lb 0.3 oz (99.8 kg)] 220 lb 0.3 oz (99.8 kg) (11/09 2126) Last BM Date: 02/16/14 (per pt states )  Intake/Output from previous day: 11/08 0701 - 11/09 0700 In: 2622.5 [P.O.:240; I.V.:1732.5; IV Piggyback:650] Out: 0  Intake/Output this shift: Total I/O In: 150 [IV Piggyback:150] Out: -   Alert, nad,  Min cellulitis, packing strips in place  Lab Results:   Recent Labs  02/17/14 0748 02/18/14 0607  WBC 12.6* 9.7  HGB 9.9* 9.3*  HCT 29.9* 28.7*  PLT 409* 332   BMET  Recent Labs  02/17/14 0748 02/18/14 0607  NA 133* 132*  K 4.7 5.2  CL 97 96  CO2 26 23  GLUCOSE 234* 194*  BUN 12 16  CREATININE 1.04 1.38*  CALCIUM 8.6 8.4   PT/INR No results for input(s): LABPROT, INR in the last 72 hours. ABG No results for input(s): PHART, HCO3 in the last 72 hours.  Invalid input(s): PCO2, PO2  Studies/Results: No results found.  Anti-infectives: Anti-infectives    Start     Dose/Rate Route Frequency Ordered Stop   02/19/14 1000  vancomycin (VANCOCIN) IVPB 750 mg/150 ml premix     750 mg150 mL/hr over 60 Minutes Intravenous Every 12 hours 02/18/14 2016     02/15/14 1600  piperacillin-tazobactam (ZOSYN) IVPB 3.375 g     3.375 g12.5 mL/hr over 240 Minutes Intravenous Every 8 hours 02/15/14 1508     02/15/14 0700  piperacillin-tazobactam (ZOSYN) IVPB 3.375 g  Status:  Discontinued     3.375 g12.5 mL/hr over 240 Minutes Intravenous  Once 02/15/14 0649 02/15/14 0655   02/15/14 0700  piperacillin-tazobactam (ZOSYN) IVPB 3.375 g     3.375 g100 mL/hr over 30 Minutes Intravenous  Once 02/15/14  0655 02/15/14 0843   02/15/14 0700  vancomycin (VANCOCIN) IVPB 750 mg/150 ml premix  Status:  Discontinued     750 mg150 mL/hr over 60 Minutes Intravenous 3 times per day 02/15/14 0655 02/18/14 2015      Assessment/Plan: s/p Procedure(s): IRRIGATION AND DEBRIDEMENT PERIRECTAL  AND SCROTAL ABSCESS (N/A)  Wounds ok Change outer gauze as needed Remove packing strips in am Will defer increasing Cr to primary team  Austin Alexander. Austin Pulling, MD, FACS General, Bariatric, & Minimally Invasive Surgery [facility], [place]   LOS: 3 days    Austin Alexander 02/18/2014

## 2014-02-19 ENCOUNTER — Encounter (HOSPITAL_COMMUNITY): Payer: Self-pay | Admitting: Surgery

## 2014-02-19 LAB — BASIC METABOLIC PANEL
Anion gap: 13 (ref 5–15)
BUN: 15 mg/dL (ref 6–23)
CALCIUM: 8.5 mg/dL (ref 8.4–10.5)
CO2: 23 mEq/L (ref 19–32)
CREATININE: 1.26 mg/dL (ref 0.50–1.35)
Chloride: 96 mEq/L (ref 96–112)
GFR calc non Af Amer: 67 mL/min — ABNORMAL LOW (ref >90)
GFR, EST AFRICAN AMERICAN: 78 mL/min — AB (ref >90)
Glucose, Bld: 199 mg/dL — ABNORMAL HIGH (ref 70–99)
Potassium: 4.7 mEq/L (ref 3.7–5.3)
Sodium: 132 mEq/L — ABNORMAL LOW (ref 137–147)

## 2014-02-19 LAB — GLUCOSE, CAPILLARY
GLUCOSE-CAPILLARY: 175 mg/dL — AB (ref 70–99)
GLUCOSE-CAPILLARY: 146 mg/dL — AB (ref 70–99)
Glucose-Capillary: 229 mg/dL — ABNORMAL HIGH (ref 70–99)
Glucose-Capillary: 145 mg/dL — ABNORMAL HIGH (ref 70–99)

## 2014-02-19 LAB — CBC
HEMATOCRIT: 27 % — AB (ref 39.0–52.0)
Hemoglobin: 9 g/dL — ABNORMAL LOW (ref 13.0–17.0)
MCH: 29.8 pg (ref 26.0–34.0)
MCHC: 33.3 g/dL (ref 30.0–36.0)
MCV: 89.4 fL (ref 78.0–100.0)
Platelets: 338 10*3/uL (ref 150–400)
RBC: 3.02 MIL/uL — ABNORMAL LOW (ref 4.22–5.81)
RDW: 14.4 % (ref 11.5–15.5)
WBC: 7.7 10*3/uL (ref 4.0–10.5)

## 2014-02-19 LAB — HEMOGLOBIN A1C
Hgb A1c MFr Bld: 6.8 % — ABNORMAL HIGH (ref <5.7)
Mean Plasma Glucose: 148 mg/dL — ABNORMAL HIGH (ref <117)

## 2014-02-19 MED ORDER — MENTHOL 3 MG MT LOZG
1.0000 | LOZENGE | OROMUCOSAL | Status: DC | PRN
  Administered 2014-02-19: 3 mg via ORAL
  Filled 2014-02-19: qty 9

## 2014-02-19 MED ORDER — INSULIN ASPART 100 UNIT/ML ~~LOC~~ SOLN
3.0000 [IU] | Freq: Three times a day (TID) | SUBCUTANEOUS | Status: DC
  Administered 2014-02-19 – 2014-02-20 (×5): 3 [IU] via SUBCUTANEOUS

## 2014-02-19 MED ORDER — INSULIN GLARGINE 100 UNIT/ML ~~LOC~~ SOLN
8.0000 [IU] | Freq: Every day | SUBCUTANEOUS | Status: DC
  Administered 2014-02-19 – 2014-02-20 (×2): 8 [IU] via SUBCUTANEOUS
  Filled 2014-02-19 (×2): qty 0.08

## 2014-02-19 NOTE — Plan of Care (Signed)
Problem: Phase II Progression Outcomes Goal: Vital signs stable Outcome: Completed/Met Date Met:  02/19/14 Goal: Return of bowel function (flatus, BM) IF ABDOMINAL SURGERY:  Outcome: Completed/Met Date Met:  02/19/14

## 2014-02-19 NOTE — Progress Notes (Addendum)
Patient ID: Austin Alexander  male  [id number]    DOB: [birth year]    DOA: 02/15/2014  PCP: No PCP Per Patient   Brief history of present illness  Patient is a 43 year old male with history of diabetes, hypertension, hyperlipidemia, cardiomyopathy, chronic folliculitis who presented with scrotal swelling and pain that started 3 days ago prior to admission and has been getting progressively worse. In the ER patient was found to have a draining abscess from his scrotum and inner buttock. CT scan showed a scrotal and gluteal abscess. General surgery and urology was consulted.  Patient underwent incision and drainage of multiple perianal and perineal abscess 11/8, Gen. Surgery and urology following closely  Assessment/Plan: Principal Problem:   Gluteal Scrotal wall abscess: Postop day 2 - continue pain control, IV vancomycin and Zosyn - status post incision and drainage of multiple perianal and perineal abscess,cultures negative so far - Management per surgery and urology, d/w CCS, will start sitz bath today, follow-up wound care instructions per surgery and urology - We will transition to oral antibiotics tomorrow at DC to doxycycline  Active Problems:   HTN (hypertension) - currently stable     Uncontrolled diabetes mellitus - Continue sliding scale insulin, added Lantus and NovoLog meal coverage  Alcohol abuse - Continue CIWA protocol with Ativan    Tobacco abuse - Continue nicotine patch    Cardiomyopathy - Currently stable, no cardiac symptoms, continue Coreg, Aldactone, temporarily holding ACE inhibitor due to mild acute renal insufficiency, may be okay to restart at DC  DVT Prophylaxis:Lovenox  Code Status:full code  Family Communication:  Disposition:likely tomorrow  Consultants:  Gen. Surgery  Urology  Procedures:  CT pelvis  Antibiotics:  IV vancomycin  IV Zosyn  Subjective: Patient seen and examined, feels better, states he is 'hanging there',  pain controlled, no complaints  Objective: Weight change: -0.672 kg (-1 lb 7.7 oz)  Intake/Output Summary (Last 24 hours) at 02/19/14 1045 Last data filed at 02/18/14 1940  Gross per 24 hour  Intake 1788.75 ml  Output      0 ml  Net 1788.75 ml   Blood pressure 131/81, pulse 83, temperature 98.6 F (37 C), temperature source Oral, resp. rate 18, height 5\' 11"  (1.803 m), weight 99.8 kg (220 lb 0.3 oz), SpO2 100 %.  Physical Exam: General: Alert and awake, oriented x3, NAD CVS: S1-S2 clear Chest: CTAB Abdomen: soft NT, ND, NBS  Extremities: no c/c/e bilaterally GU: dressing intact   Lab Results: Basic Metabolic Panel:  Recent Labs Lab 02/18/14 0607 02/19/14 0338  NA 132* 132*  K 5.2 4.7  CL 96 96  CO2 23 23  GLUCOSE 194* 199*  BUN 16 15  CREATININE 1.38* 1.26  CALCIUM 8.4 8.5   Liver Function Tests:  Recent Labs Lab 02/15/14 0245 02/16/14 0459  AST 13 9  ALT 11 8  ALKPHOS 128* 132*  BILITOT 0.4 0.5  PROT 8.2 7.7  ALBUMIN 2.4* 2.1*   No results for input(s): LIPASE, AMYLASE in the last 168 hours. No results for input(s): AMMONIA in the last 168 hours. CBC:  Recent Labs Lab 02/15/14 0245  02/18/14 0607 02/19/14 0338  WBC 13.2*  < > 9.7 7.7  NEUTROABS 10.9*  --   --   --   HGB 11.4*  < > 9.3* 9.0*  HCT 33.5*  < > 28.7* 27.0*  MCV 88.6  < > 93.2 89.4  PLT 440*  < > 332 338  < > = values in this  interval not displayed. Cardiac Enzymes: No results for input(s): CKTOTAL, CKMB, CKMBINDEX, TROPONINI in the last 168 hours. BNP: Invalid input(s): POCBNP CBG:  Recent Labs Lab 02/18/14 0739 02/18/14 1148 02/18/14 1649 02/18/14 2122 02/19/14 0848  GLUCAP 227* 241* 141* 222* 229*     Micro Results: Recent Results (from the past 240 hour(s))  Blood culture (routine x 2)     Status: None (Preliminary result)   Collection Time: 02/15/14  7:45 AM  Result Value Ref Range Status   Specimen Description BLOOD LEFT FOREARM  Final   Special Requests  BOTTLES DRAWN AEROBIC AND ANAEROBIC 5CC  Final   Culture  Setup Time   Final    02/15/2014 15:41 Performed at [facility]    Culture   Final           BLOOD CULTURE RECEIVED NO GROWTH TO DATE CULTURE WILL BE HELD FOR 5 DAYS BEFORE ISSUING A FINAL NEGATIVE REPORT Performed at [facility]    Report Status PENDING  Incomplete  Blood culture (routine x 2)     Status: None (Preliminary result)   Collection Time: 02/15/14  8:00 AM  Result Value Ref Range Status   Specimen Description BLOOD RIGHT ARM  Final   Special Requests BOTTLES DRAWN AEROBIC AND ANAEROBIC 10CC  Final   Culture  Setup Time   Final    02/15/2014 15:41 Performed at [facility]    Culture   Final           BLOOD CULTURE RECEIVED NO GROWTH TO DATE CULTURE WILL BE HELD FOR 5 DAYS BEFORE ISSUING A FINAL NEGATIVE REPORT Performed at [facility]    Report Status PENDING  Incomplete  Surgical pcr screen     Status: None   Collection Time: 02/17/14 11:06 AM  Result Value Ref Range Status   MRSA, PCR NEGATIVE NEGATIVE Final   Staphylococcus aureus NEGATIVE NEGATIVE Final    Comment:        The Xpert SA Assay (FDA approved for NASAL specimens in patients over 7 years of age), is one component of a comprehensive surveillance program.  Test performance has been validated by [employer] for patients greater than or equal to 30 year old. It is not intended to diagnose infection nor to guide or monitor treatment.   Anaerobic culture     Status: None (Preliminary result)   Collection Time: 02/17/14  1:50 PM  Result Value Ref Range Status   Specimen Description TISSUE SCROTUM  Final   Special Requests PATIENT ON FOLLOWING VANCOMYCIN ZOSYN  Final   Gram Stain   Final    FEW WBC PRESENT,BOTH PMN AND MONONUCLEAR NO SQUAMOUS EPITHELIAL CELLS SEEN NO ORGANISMS SEEN Performed at [facility]    Culture PENDING  Incomplete   Report Status PENDING  Incomplete  Anaerobic culture      Status: None (Preliminary result)   Collection Time: 02/17/14  1:50 PM  Result Value Ref Range Status   Specimen Description ABSCESS SCROTUM  Final   Special Requests POF VANCOMYCIN ZOSYN  Final   Gram Stain   Final    FEW WBC PRESENT,BOTH PMN AND MONONUCLEAR NO SQUAMOUS EPITHELIAL CELLS SEEN NO ORGANISMS SEEN Performed at [facility]    Culture   Final    NO ANAEROBES ISOLATED; CULTURE IN PROGRESS FOR 5 DAYS Performed at [facility]    Report Status PENDING  Incomplete  Tissue culture     Status: None (Preliminary  result)   Collection Time: 02/17/14  1:50 PM  Result Value Ref Range Status   Specimen Description TISSUE SCROTUM  Final   Special Requests PATIENT ON FOLLOWING VANCOMYCIN ZOSYN  Final   Gram Stain   Final    FEW WBC PRESENT,BOTH PMN AND MONONUCLEAR NO SQUAMOUS EPITHELIAL CELLS SEEN NO ORGANISMS SEEN Performed at [facility]    Culture   Final    NO GROWTH 1 DAY Performed at [facility]    Report Status PENDING  Incomplete  Culture, routine-abscess     Status: None (Preliminary result)   Collection Time: 02/17/14  1:50 PM  Result Value Ref Range Status   Specimen Description ABSCESS SCROTUM  Final   Special Requests PATIENT ON FOLLOWING VANCOMYCIN ZOSYN  Final   Gram Stain   Final    ABUNDANT WBC PRESENT,BOTH PMN AND MONONUCLEAR NO SQUAMOUS EPITHELIAL CELLS SEEN NO ORGANISMS SEEN Performed at [facility]    Culture NO GROWTH Performed at [facility]   Final   Report Status PENDING  Incomplete    Studies/Results: Ct Pelvis W Contrast  02/15/2014   CLINICAL DATA:  Bilateral testicular swelling and pain for 2 days. No known injury. No dysuria. No fever or chills.  EXAM: CT PELVIS WITH CONTRAST  TECHNIQUE: Multidetector CT imaging of the pelvis was performed using the standard protocol following the bolus administration of intravenous contrast.  CONTRAST:  157mL OMNIPAQUE IOHEXOL 300 MG/ML SOLN 100 cc  Omnipaque 300  COMPARISON:  10/31/2013  FINDINGS: There is diffuse scrotal wall edema bilaterally. Just to the left of midline along the inferior aspect of the scrotum there is an irregular hypoechoic collection with rim enhancement. This measures 1.6 x 4.4 x 4.9 cm and is slightly larger when compared with the prior study, suspicious for abscess. There is heterogeneous appearance along the inferior aspect of the left hemiscrotum, suspicious for additional complex fluid collection. There is more simple appearing fluid along the dependent aspect of the right hemiscrotum, possibly representing hydrocele versus edema.  There is bilateral inguinal adenopathy, largest node on the right measuring 3.1 cm. Largest node on the left measures 2.2 cm. Dependent edema identified in the buttocks bilaterally. To the right of midline in the gluteal cleft there is question of skin edema, skin breakdown, or possible abscess. This heterogeneous collection measures 2.4 x 5.3 cm. Correlation with physical exam findings recommended.  Urinary bladder has a normal appearance. There are calcifications of the seminal vesicles. Visualized bowel loops are unremarkable in appearance. There are degenerative changes in the lower lumbar spine.  IMPRESSION: 1. Focal collection along the inferior aspect of the scrotum suspicious for abscess. As CT cannot assess integrity of the blood flow in the testicles, further characterization with scrotal ultrasound and Doppler recommended. 2. Hydrocele versus edema involving the dependent aspect of the right hemiscrotum. 3. Bilateral inguinal adenopathy. 4. Question right gluteal cleft abscess, skin thickening, or breakdown. 5. These results will be called to the ordering clinician or representative by the Radiologist Assistant, and communication documented in the PACS or zVision Dashboard.   Electronically Signed   By: Shon Hale M.D.   On: 02/15/2014 08:33    Medications: Scheduled Meds: . aspirin EC   81 mg Oral Daily  . carvedilol  25 mg Oral BID WC  . enoxaparin (LOVENOX) injection  50 mg Subcutaneous Q24H  . folic acid  1 mg Oral Daily  . insulin aspart  0-15 Units Subcutaneous TID WC  .  insulin aspart  0-5 Units Subcutaneous QHS  . insulin aspart  3 Units Subcutaneous TID WC  . insulin glargine  8 Units Subcutaneous Daily  . multivitamin with minerals  1 tablet Oral Daily  . piperacillin-tazobactam (ZOSYN)  IV  3.375 g Intravenous Q8H  . simvastatin  40 mg Oral Daily  . spironolactone  25 mg Oral Daily  . thiamine  100 mg Oral Daily  . vancomycin  750 mg Intravenous Q12H      LOS: 4 days   Brianah Hopson M.D. [facility] 02/19/2014, 10:45 AM Pager: [phone]  If 7PM-7AM, please contact night-coverage [email or web address] Password [email or web address]

## 2014-02-19 NOTE — Progress Notes (Signed)
Patient ID: Austin Alexander, male   DOB: 1970/07/17, 43 y.o.   MRN: [id number] 2 Days Post-Op  Subjective: Pt feels ok today.  Objective: Vital signs in last 24 hours: Temp:  [98.6 F (37 C)-98.7 F (37.1 C)] 98.6 F (37 C) (11/10 0539) Pulse Rate:  [83-91] 83 (11/10 0539) Resp:  [18-20] 18 (11/10 0539) BP: (126-134)/(81-92) 131/81 mmHg (11/10 0539) SpO2:  [100 %] 100 % (11/10 0539) Weight:  [220 lb 0.3 oz (99.8 kg)] 220 lb 0.3 oz (99.8 kg) (11/09 2126) Last BM Date: 02/16/14 (per pt states )  Intake/Output from previous day: 11/09 0701 - 11/10 0700 In: 2678.8 [P.O.:1320; I.V.:958.8; IV Piggyback:400] Out: -  Intake/Output this shift:    PE: GU: all packing removed.  Scrotal areas look clean.  Perineal and buttock areas still with some drainage present  Lab Results:   Recent Labs  02/18/14 0607 02/19/14 0338  WBC 9.7 7.7  HGB 9.3* 9.0*  HCT 28.7* 27.0*  PLT 332 338   BMET  Recent Labs  02/18/14 0607 02/19/14 0338  NA 132* 132*  K 5.2 4.7  CL 96 96  CO2 23 23  GLUCOSE 194* 199*  BUN 16 15  CREATININE 1.38* 1.26  CALCIUM 8.4 8.5   PT/INR No results for input(s): LABPROT, INR in the last 72 hours. CMP     Component Value Date/Time   NA 132* 02/19/2014 0338   K 4.7 02/19/2014 0338   CL 96 02/19/2014 0338   CO2 23 02/19/2014 0338   GLUCOSE 199* 02/19/2014 0338   BUN 15 02/19/2014 0338   CREATININE 1.26 02/19/2014 0338   CALCIUM 8.5 02/19/2014 0338   PROT 7.7 02/16/2014 0459   ALBUMIN 2.1* 02/16/2014 0459   AST 9 02/16/2014 0459   ALT 8 02/16/2014 0459   ALKPHOS 132* 02/16/2014 0459   BILITOT 0.5 02/16/2014 0459   GFRNONAA 67* 02/19/2014 0338   GFRAA 78* 02/19/2014 0338   Lipase  No results found for: LIPASE     Studies/Results: No results found.  Anti-infectives: Anti-infectives    Start     Dose/Rate Route Frequency Ordered Stop   02/19/14 1000  vancomycin (VANCOCIN) IVPB 750 mg/150 ml premix     750 mg150 mL/hr over 60 Minutes  Intravenous Every 12 hours 02/18/14 2016     02/15/14 1600  piperacillin-tazobactam (ZOSYN) IVPB 3.375 g     3.375 g12.5 mL/hr over 240 Minutes Intravenous Every 8 hours 02/15/14 1508     02/15/14 0700  piperacillin-tazobactam (ZOSYN) IVPB 3.375 g  Status:  Discontinued     3.375 g12.5 mL/hr over 240 Minutes Intravenous  Once 02/15/14 0649 02/15/14 0655   02/15/14 0700  piperacillin-tazobactam (ZOSYN) IVPB 3.375 g     3.375 g100 mL/hr over 30 Minutes Intravenous  Once 02/15/14 0655 02/15/14 0843   02/15/14 0700  vancomycin (VANCOCIN) IVPB 750 mg/150 ml premix  Status:  Discontinued     750 mg150 mL/hr over 60 Minutes Intravenous 3 times per day 02/15/14 0655 02/18/14 2015       Assessment/Plan  1. Hidradenitis of scrotum and perineum, POD 2, s/p I&D  Plan: 1. Packing removed.  We will not replace packing for perineum and buttock.  We will start sitz bathes and cover with dry dressings.  Will defer to urology if they want to repack his scrotum or just leave it open and do sitz bathes for this as well.   2. Ok to change to oral abx therapy.  All cultures negative.  Would likely switch to doxycycline for a total of 7 days. 3. Suspect he will be ok for dc tomorrow.   LOS: 4 days    Darlina Mccaughey E 02/19/2014, 10:15 AM Pager: [phone]

## 2014-02-19 NOTE — Care Management Note (Signed)
CARE MANAGEMENT NOTE 02/19/2014  Patient:  TYRIQ, COULTHARD   Account Number:  [id number]  Date Initiated:  02/19/2014  Documentation initiated by:  Ruble Buttler  Subjective/Objective Assessment:   CM following for progression and d/c planning.     Action/Plan:   Pt will need HHRN for assistance with dressing changes.   Anticipated DC Date:     Anticipated DC Plan:  [place]         Choice offered to / List presented to:          [facility] arranged  HH-1 RN      [place].   Status of service:   Medicare Important Message given?   (If response is "NO", the following Medicare IM given date fields will be blank) Date Medicare IM given:   Medicare IM given by:   Date Additional Medicare IM given:   Additional Medicare IM given by:    Discharge Disposition:    Per UR Regulation:    If discussed at Long Length of Stay Meetings, dates discussed:    Comments:

## 2014-02-19 NOTE — Progress Notes (Signed)
Order for sitz bath 11/10. Patient states he will do it in the morning of 02/20/14. Patient educated on how to use sitz bath through teach back and demonstration. Will continue to monitor and follow up.

## 2014-02-20 DIAGNOSIS — I5022 Chronic systolic (congestive) heart failure: Secondary | ICD-10-CM

## 2014-02-20 LAB — GLUCOSE, CAPILLARY
Glucose-Capillary: 133 mg/dL — ABNORMAL HIGH (ref 70–99)
Glucose-Capillary: 139 mg/dL — ABNORMAL HIGH (ref 70–99)

## 2014-02-20 LAB — CULTURE, ROUTINE-ABSCESS

## 2014-02-20 MED ORDER — METFORMIN HCL 500 MG PO TABS: 1000.0000 mg | ORAL_TABLET | Freq: Two times a day (BID) | ORAL | Status: DC

## 2014-02-20 MED ORDER — DOXYCYCLINE HYCLATE 100 MG PO TABS: 100.0000 mg | ORAL_TABLET | Freq: Two times a day (BID) | ORAL | Status: AC

## 2014-02-20 MED ORDER — DOXYCYCLINE HYCLATE 100 MG PO TABS
100.0000 mg | ORAL_TABLET | Freq: Two times a day (BID) | ORAL | Status: DC
  Filled 2014-02-20: qty 1

## 2014-02-20 MED ORDER — HYDROCODONE-ACETAMINOPHEN 5-325 MG PO TABS: 1.0000 | ORAL_TABLET | ORAL | Status: DC | PRN

## 2014-02-20 NOTE — Plan of Care (Signed)
Problem: Phase III Progression Outcomes Goal: Pain controlled on oral analgesia Outcome: Completed/Met Date Met:  02/20/14     

## 2014-02-20 NOTE — Progress Notes (Signed)
Patient ID: Austin Alexander, male   DOB: 1971-02-09, 43 y.o.   MRN: [id number] 3 Days Post-Op  Subjective: Pt feels well.  Ready to go home  Objective: Vital signs in last 24 hours: Temp:  [98 F (36.7 C)-98.8 F (37.1 C)] 98.8 F (37.1 C) (11/11 0941) Pulse Rate:  [85-92] 92 (11/11 0941) Resp:  [18-20] 18 (11/11 0941) BP: (131-161)/(89-109) 161/109 mmHg (11/11 0941) SpO2:  [96 %-100 %] 100 % (11/11 0941) Weight:  [219 lb 5.7 oz (99.5 kg)] 219 lb 5.7 oz (99.5 kg) (11/10 2055) Last BM Date: 02/16/14 (per pt states )  Intake/Output from previous day: 11/10 0701 - 11/11 0700 In: 3950 [P.O.:1200; I.V.:2400; IV Piggyback:350] Out: 0  Intake/Output this shift:    PE: GU: wounds remain stable, minimal drainage from perineal/gluteal wounds  Lab Results:   Recent Labs  02/18/14 0607 02/19/14 0338  WBC 9.7 7.7  HGB 9.3* 9.0*  HCT 28.7* 27.0*  PLT 332 338   BMET  Recent Labs  02/18/14 0607 02/19/14 0338  NA 132* 132*  K 5.2 4.7  CL 96 96  CO2 23 23  GLUCOSE 194* 199*  BUN 16 15  CREATININE 1.38* 1.26  CALCIUM 8.4 8.5   PT/INR No results for input(s): LABPROT, INR in the last 72 hours. CMP     Component Value Date/Time   NA 132* 02/19/2014 0338   K 4.7 02/19/2014 0338   CL 96 02/19/2014 0338   CO2 23 02/19/2014 0338   GLUCOSE 199* 02/19/2014 0338   BUN 15 02/19/2014 0338   CREATININE 1.26 02/19/2014 0338   CALCIUM 8.5 02/19/2014 0338   PROT 7.7 02/16/2014 0459   ALBUMIN 2.1* 02/16/2014 0459   AST 9 02/16/2014 0459   ALT 8 02/16/2014 0459   ALKPHOS 132* 02/16/2014 0459   BILITOT 0.5 02/16/2014 0459   GFRNONAA 67* 02/19/2014 0338   GFRAA 78* 02/19/2014 0338   Lipase  No results found for: LIPASE     Studies/Results: No results found.  Anti-infectives: Anti-infectives    Start     Dose/Rate Route Frequency Ordered Stop   02/19/14 1000  vancomycin (VANCOCIN) IVPB 750 mg/150 ml premix     750 mg150 mL/hr over 60 Minutes Intravenous Every 12 hours  02/18/14 2016     02/15/14 1600  piperacillin-tazobactam (ZOSYN) IVPB 3.375 g     3.375 g12.5 mL/hr over 240 Minutes Intravenous Every 8 hours 02/15/14 1508     02/15/14 0700  piperacillin-tazobactam (ZOSYN) IVPB 3.375 g  Status:  Discontinued     3.375 g12.5 mL/hr over 240 Minutes Intravenous  Once 02/15/14 0649 02/15/14 0655   02/15/14 0700  piperacillin-tazobactam (ZOSYN) IVPB 3.375 g     3.375 g100 mL/hr over 30 Minutes Intravenous  Once 02/15/14 0655 02/15/14 0843   02/15/14 0700  vancomycin (VANCOCIN) IVPB 750 mg/150 ml premix  Status:  Discontinued     750 mg150 mL/hr over 60 Minutes Intravenous 3 times per day 02/15/14 0655 02/18/14 2015       Assessment/Plan  1. POD 3, s/p I&D of scrotal/perineal abscess 2. Hidradenitis  Plan: 1. Patient is stable for dc home from our standpoint.  He may follow up with Dr. Ninfa Linden and Dr. Risa Grill  2. Would likely switch to doxy for a total of 7 days.  LOS: 5 days    Rykin Route E 02/20/2014, 10:11 AM Pager: [phone]

## 2014-02-20 NOTE — Progress Notes (Signed)
Reviewed with pt how to use sitz bath, all medications and when to take, and importance of follow up appointments. Pt states that he feels ready to go and will be compliant with medications.

## 2014-02-20 NOTE — Discharge Summary (Signed)
Discharge Summary  Austin Alexander [id number] DOB: 08-27-1970  PCP: No PCP Per Patient  Admit date: 02/15/2014 Discharge date: 02/20/2014  Time spent: 25 minutes  Recommendations for Outpatient Follow-up:  1. New medication: Vicodin 5/325 by mouth every 6 hours when necessary total #20 2. newMedication: Doxycycline 100 mg by mouth 7 days 3. Patient will follow-up with Dr. grapey-urology 4. Patient will follow-up with Dr. Danelle Earthly. surgery  Discharge Diagnoses:  Active Hospital Problems   Diagnosis Date Noted  . Scrotal wall abscess 02/15/2014  . Cardiomyopathy 10/31/2013  . Tobacco abuse 09/22/2013  . HTN (hypertension) 05/19/2012  . Uncontrolled diabetes mellitus 05/19/2012    Resolved Hospital Problems   Diagnosis Date Noted Date Resolved  No resolved problems to display.    Discharge Condition: improved, being discharged home  Diet recommendation: carb modified, low sodium  Filed Weights   02/17/14 2137 02/18/14 2126 02/19/14 2055  Weight: 100.472 kg (221 lb 8 oz) 99.8 kg (220 lb 0.3 oz) 99.5 kg (219 lb 5.7 oz)    History of present illness:  43 year old male with past medical history of diabetes, hypertension who presented to the emergency room on 11/6 with scrotal swelling and pain starting 3 days prior and found to have a scrotal and gluteal abscess.  Hospital Course:  Principal Problem:   Scrotal wall abscess: Seen by urology and general surgery and patient underwent I & D of multiple perianal and perineal abscesses. Postprocedure he did well. And await wound care as well as continued on IV antibiotics which was able to be narrowed down to doxycycline. Patient discharged on 7 days of by mouth doxycycline twice a day and he will follow up with urology and general surgery as outpatient Active Problems:   HTN (hypertension): Patient will be continued on his home doses of Coreg and lisinopril   Uncontrolled diabetes mellitus:normally on metformin 1000 twice a  day. A1c surprisingly stable at 6.8.patient had very high blood sugars during hospitalization, likely in the setting of acute infection. With debridement and antibiotics, this improved. He will be discharged on his normal regimen.    Tobacco abuse: Patient on nicotine patch during this hospitalization.    Chronic systolic heart failure: Continued on beta blocker and lisinopril. No signs of any acute volume overload. Echocardiogram done earlier this year noted ejection fraction of 25-30 percent   Procedures:  Status post I&D of multiple perianal and scrotal abscesses done 11/8  Consultations:  urology  Gen. surgery  Discharge Exam: BP 161/109 mmHg  Pulse 92  Temp(Src) 98.8 F (37.1 C) (Oral)  Resp 18  Ht 5\' 11"  (1.803 m)  Wt 99.5 kg (219 lb 5.7 oz)  BMI 30.61 kg/m2  SpO2 100%  General: alert and oriented 3, no acute distress Cardiovascular: regular rate and rhythm, S1-S2 Respiratory: clear to auscultation bilaterally Wounds packed  Discharge Instructions You were cared for by a hospitalist during your hospital stay. If you have any questions about your discharge medications or the care you received while you were in the hospital after you are discharged, you can call the unit and asked to speak with the hospitalist on call if the hospitalist that took care of you is not available. Once you are discharged, your primary care physician will handle any further medical issues. Please note that NO REFILLS for any discharge medications will be authorized once you are discharged, as it is imperative that you return to your primary care physician (or establish a relationship with a primary care physician if you  do not have one) for your aftercare needs so that they can reassess your need for medications and monitor your lab values.  Discharge Instructions    Diet - low sodium heart healthy    Complete by:  As directed      Increase activity slowly    Complete by:  As directed              Medication List    STOP taking these medications        clindamycin 150 MG capsule  Commonly known as:  CLEOCIN     multivitamin with minerals Tabs tablet      TAKE these medications        aspirin 81 MG EC tablet  Take 1 tablet (81 mg total) by mouth daily.     carvedilol 25 MG tablet  Commonly known as:  COREG  Take 25 mg by mouth 2 (two) times daily with a meal.     CENTRUM ADULTS PO  Take 1 tablet by mouth daily.     doxycycline 100 MG tablet  Commonly known as:  VIBRA-TABS  Take 1 tablet (100 mg total) by mouth every 12 (twelve) hours.     HYDROcodone-acetaminophen 5-325 MG per tablet  Commonly known as:  NORCO/VICODIN  Take 1 tablet by mouth every 4 (four) hours as needed for moderate pain.     lisinopril 20 MG tablet  Commonly known as:  PRINIVIL,ZESTRIL  Take 20 mg by mouth daily.     metFORMIN 500 MG tablet  Commonly known as:  GLUCOPHAGE  Take 2 tablets (1,000 mg total) by mouth 2 (two) times daily with a meal.     saccharomyces boulardii 250 MG capsule  Commonly known as:  FLORASTOR  Take 1 capsule (250 mg total) by mouth 2 (two) times daily.     simvastatin 40 MG tablet  Commonly known as:  ZOCOR  Take 1 tablet (40 mg total) by mouth daily.     spironolactone 25 MG tablet  Commonly known as:  ALDACTONE  Take 1 tablet (25 mg total) by mouth daily.       No Known Allergies     Follow-up Information    Follow up with [facility] A, MD. Schedule an appointment as soon as possible for a visit in 2 weeks.   Specialty:  General Surgery   Contact information:   [address] [place] [address] [phone]       Follow up with [facility] S, MD. Schedule an appointment as soon as possible for a visit in 2 weeks.   Specialty:  Urology   Contact information:   [place] [address] [phone]        The results of significant diagnostics from this hospitalization (including imaging, microbiology,  ancillary and laboratory) are listed below for reference.    Significant Diagnostic Studies: Ct Pelvis W Contrast  02/15/2014   CLINICAL DATA:  Bilateral testicular swelling and pain for 2 days. No known injury. No dysuria. No fever or chills.  EXAM: CT PELVIS WITH CONTRAST  TECHNIQUE: Multidetector CT imaging of the pelvis was performed using the standard protocol following the bolus administration of intravenous contrast.  CONTRAST:  122mL OMNIPAQUE IOHEXOL 300 MG/ML SOLN 100 cc Omnipaque 300  COMPARISON:  10/31/2013  FINDINGS: There is diffuse scrotal wall edema bilaterally. Just to the left of midline along the inferior aspect of the scrotum there is an irregular hypoechoic collection with rim enhancement.  This measures 1.6 x 4.4 x 4.9 cm and is slightly larger when compared with the prior study, suspicious for abscess. There is heterogeneous appearance along the inferior aspect of the left hemiscrotum, suspicious for additional complex fluid collection. There is more simple appearing fluid along the dependent aspect of the right hemiscrotum, possibly representing hydrocele versus edema.  There is bilateral inguinal adenopathy, largest node on the right measuring 3.1 cm. Largest node on the left measures 2.2 cm. Dependent edema identified in the buttocks bilaterally. To the right of midline in the gluteal cleft there is question of skin edema, skin breakdown, or possible abscess. This heterogeneous collection measures 2.4 x 5.3 cm. Correlation with physical exam findings recommended.  Urinary bladder has a normal appearance. There are calcifications of the seminal vesicles. Visualized bowel loops are unremarkable in appearance. There are degenerative changes in the lower lumbar spine.  IMPRESSION: 1. Focal collection along the inferior aspect of the scrotum suspicious for abscess. As CT cannot assess integrity of the blood flow in the testicles, further characterization with scrotal ultrasound and Doppler  recommended. 2. Hydrocele versus edema involving the dependent aspect of the right hemiscrotum. 3. Bilateral inguinal adenopathy. 4. Question right gluteal cleft abscess, skin thickening, or breakdown. 5. These results will be called to the ordering clinician or representative by the Radiologist Assistant, and communication documented in the PACS or zVision Dashboard.   Electronically Signed   By: Shon Hale M.D.   On: 02/15/2014 08:33    Microbiology: Recent Results (from the past 240 hour(s))  Blood culture (routine x 2)     Status: None (Preliminary result)   Collection Time: 02/15/14  7:45 AM  Result Value Ref Range Status   Specimen Description BLOOD LEFT FOREARM  Final   Special Requests BOTTLES DRAWN AEROBIC AND ANAEROBIC 5CC  Final   Culture  Setup Time   Final    02/15/2014 15:41 Performed at [facility]    Culture   Final           BLOOD CULTURE RECEIVED NO GROWTH TO DATE CULTURE WILL BE HELD FOR 5 DAYS BEFORE ISSUING A FINAL NEGATIVE REPORT Performed at [facility]    Report Status PENDING  Incomplete  Blood culture (routine x 2)     Status: None (Preliminary result)   Collection Time: 02/15/14  8:00 AM  Result Value Ref Range Status   Specimen Description BLOOD RIGHT ARM  Final   Special Requests BOTTLES DRAWN AEROBIC AND ANAEROBIC 10CC  Final   Culture  Setup Time   Final    02/15/2014 15:41 Performed at [facility]    Culture   Final           BLOOD CULTURE RECEIVED NO GROWTH TO DATE CULTURE WILL BE HELD FOR 5 DAYS BEFORE ISSUING A FINAL NEGATIVE REPORT Performed at [facility]    Report Status PENDING  Incomplete  Surgical pcr screen     Status: None   Collection Time: 02/17/14 11:06 AM  Result Value Ref Range Status   MRSA, PCR NEGATIVE NEGATIVE Final   Staphylococcus aureus NEGATIVE NEGATIVE Final    Comment:        The Xpert SA Assay (FDA approved for NASAL specimens in patients over 80 years of age), is one component  of a comprehensive surveillance program.  Test performance has been validated by [employer] for patients greater than or equal to 14 year old. It is not intended to diagnose infection  nor to guide or monitor treatment.   Anaerobic culture     Status: None (Preliminary result)   Collection Time: 02/17/14  1:50 PM  Result Value Ref Range Status   Specimen Description TISSUE SCROTUM  Final   Special Requests PATIENT ON FOLLOWING VANCOMYCIN ZOSYN  Final   Gram Stain   Final    FEW WBC PRESENT,BOTH PMN AND MONONUCLEAR NO SQUAMOUS EPITHELIAL CELLS SEEN NO ORGANISMS SEEN Performed at [facility]    Culture PENDING  Incomplete   Report Status PENDING  Incomplete  Anaerobic culture     Status: None (Preliminary result)   Collection Time: 02/17/14  1:50 PM  Result Value Ref Range Status   Specimen Description ABSCESS SCROTUM  Final   Special Requests POF VANCOMYCIN ZOSYN  Final   Gram Stain   Final    FEW WBC PRESENT,BOTH PMN AND MONONUCLEAR NO SQUAMOUS EPITHELIAL CELLS SEEN NO ORGANISMS SEEN Performed at [facility]    Culture   Final    NO ANAEROBES ISOLATED; CULTURE IN PROGRESS FOR 5 DAYS Performed at [facility]    Report Status PENDING  Incomplete  Tissue culture     Status: None (Preliminary result)   Collection Time: 02/17/14  1:50 PM  Result Value Ref Range Status   Specimen Description TISSUE SCROTUM  Final   Special Requests PATIENT ON FOLLOWING VANCOMYCIN ZOSYN  Final   Gram Stain   Final    FEW WBC PRESENT,BOTH PMN AND MONONUCLEAR NO SQUAMOUS EPITHELIAL CELLS SEEN NO ORGANISMS SEEN Performed at [facility]    Culture   Final    NO GROWTH 3 DAYS Performed at [facility]    Report Status PENDING  Incomplete  Culture, routine-abscess     Status: None   Collection Time: 02/17/14  1:50 PM  Result Value Ref Range Status   Specimen Description ABSCESS SCROTUM  Final   Special Requests PATIENT ON FOLLOWING VANCOMYCIN  ZOSYN  Final   Gram Stain   Final    ABUNDANT WBC PRESENT,BOTH PMN AND MONONUCLEAR NO SQUAMOUS EPITHELIAL CELLS SEEN NO ORGANISMS SEEN Performed at [facility]    Culture   Final    RARE GROUP B STREP(S.AGALACTIAE)ISOLATED Note: TESTING AGAINST S. AGALACTIAE NOT ROUTINELY PERFORMED DUE TO PREDICTABILITY OF AMP/PEN/VAN SUSCEPTIBILITY. Performed at [facility]    Report Status 02/20/2014 FINAL  Final     Labs: Basic Metabolic Panel:  Recent Labs Lab 02/15/14 0245 02/16/14 0459 02/17/14 0748 02/18/14 0607 02/19/14 0338  NA 131* 132* 133* 132* 132*  K 4.3 4.8 4.7 5.2 4.7  CL 93* 95* 97 96 96  CO2 21 24 26 23 23   GLUCOSE 67* 167* 234* 194* 199*  BUN 15 16 12 16 15   CREATININE 1.08 1.04 1.04 1.38* 1.26  CALCIUM 8.7 8.8 8.6 8.4 8.5   Liver Function Tests:  Recent Labs Lab 02/15/14 0245 02/16/14 0459  AST 13 9  ALT 11 8  ALKPHOS 128* 132*  BILITOT 0.4 0.5  PROT 8.2 7.7  ALBUMIN 2.4* 2.1*   No results for input(s): LIPASE, AMYLASE in the last 168 hours. No results for input(s): AMMONIA in the last 168 hours. CBC:  Recent Labs Lab 02/15/14 0245 02/16/14 0459 02/17/14 0748 02/18/14 0607 02/19/14 0338  WBC 13.2* 15.0* 12.6* 9.7 7.7  NEUTROABS 10.9*  --   --   --   --   HGB 11.4* 11.3* 9.9* 9.3* 9.0*  HCT 33.5* 33.8* 29.9* 28.7* 27.0*  MCV 88.6 90.4 90.9 93.2 89.4  PLT 440* 409* 409* 332 338   Cardiac Enzymes: No results for input(s): CKTOTAL, CKMB, CKMBINDEX, TROPONINI in the last 168 hours. BNP: BNP (last 3 results)  Recent Labs  09/22/13 0344  PROBNP 1845.0*   CBG:  Recent Labs Lab 02/19/14 1212 02/19/14 1707 02/19/14 2047 02/20/14 0739 02/20/14 1145  GLUCAP 175* 146* 145* 133* 139*       Signed:  Raydel Hosick K  [facility] 02/20/2014, 2:48 PM

## 2014-02-20 NOTE — Care Management Note (Signed)
CARE MANAGEMENT NOTE 02/20/2014  Patient:  Austin Alexander, Austin Alexander   Account Number:  [id number]  Date Initiated:  02/19/2014  Documentation initiated by:  Journey Ratterman  Subjective/Objective Assessment:   CM following for progression and d/c planning.     Action/Plan:   Pt will need HHRN for assistance with dressing changes.   Anticipated DC Date:  02/20/2014   Anticipated DC Plan:  [place]         Choice offered to / List presented to:          [place] arranged  HH-1 RN      [place].   Status of service:  Completed, signed off Medicare Important Message given?   (If response is "NO", the following Medicare IM given date fields will be blank) Date Medicare IM given:   Medicare IM given by:   Date Additional Medicare IM given:   Additional Medicare IM given by:    Discharge Disposition:  [place]  Per UR Regulation:    If discussed at Long Length of Stay Meetings, dates discussed:    Comments:  02/20/2014 [facility] aware of plan to d/c this pt to home today. CRoyal RN MPH, case manager, [phone]

## 2014-02-20 NOTE — Discharge Instructions (Signed)
Sitz Bath A sitz bath is a warm water bath taken in the sitting position that covers only the hips and buttocks. It may be used for either healing or hygiene purposes. Sitz baths are also used to relieve pain, itching, or muscle spasms. The water may contain medicine. Moist heat will help you heal and relax.  HOME CARE INSTRUCTIONS  Take 3 to 4 sitz baths a day. 1. Fill the bathtub half full with warm water. 2. Sit in the water and open the drain a little. 3. Turn on the warm water to keep the tub half full. Keep the water running constantly. 4. Soak in the water for 15 to 20 minutes. 5. After the sitz bath, pat the affected area dry first. SEEK MEDICAL CARE IF:  You get worse instead of better. Stop the sitz baths if you get worse. MAKE SURE YOU:  Understand these instructions.  Will watch your condition.  Will get help right away if you are not doing well or get worse. Document Released: 12/20/2003 Document Revised: 12/22/2011 Document Reviewed: 06/26/2010 [facility] Patient Information 2015 [place], [place]. This information is not intended to replace advice given to you by your health care provider. Make sure you discuss any questions you have with your health care provider.

## 2014-02-21 LAB — TISSUE CULTURE: CULTURE: NO GROWTH

## 2014-02-21 LAB — CULTURE, BLOOD (ROUTINE X 2)
Culture: NO GROWTH
Culture: NO GROWTH

## 2014-02-21 MED ORDER — HYDROCODONE-ACETAMINOPHEN 5-325 MG PO TABS: 1.0000 | ORAL_TABLET | ORAL | Status: DC | PRN

## 2014-02-22 LAB — ANAEROBIC CULTURE

## 2014-04-06 ENCOUNTER — Encounter (HOSPITAL_COMMUNITY): Payer: Self-pay | Admitting: *Deleted

## 2014-04-06 ENCOUNTER — Emergency Department (HOSPITAL_COMMUNITY)
Admission: EM | Admit: 2014-04-06 | Discharge: 2014-04-06 | Disposition: A | Discharge status: Nursing Home (Includes ICF) | Payer: 59 | Source: Home / Self Care | Attending: Family Medicine | Admitting: Family Medicine

## 2014-04-06 ENCOUNTER — Emergency Department (HOSPITAL_COMMUNITY): Payer: 59

## 2014-04-06 ENCOUNTER — Inpatient Hospital Stay (HOSPITAL_COMMUNITY)
Admission: EM | Admit: 2014-04-06 | Discharge: 2014-04-08 | DRG: 292 | Disposition: A | Discharge status: Home / Self Care | Payer: 59 | Attending: Internal Medicine | Admitting: Internal Medicine

## 2014-04-06 DIAGNOSIS — R0602 Shortness of breath: Secondary | ICD-10-CM | POA: Diagnosis present

## 2014-04-06 DIAGNOSIS — I429 Cardiomyopathy, unspecified: Secondary | ICD-10-CM | POA: Diagnosis present

## 2014-04-06 DIAGNOSIS — J050 Acute obstructive laryngitis [croup]: Secondary | ICD-10-CM | POA: Diagnosis present

## 2014-04-06 DIAGNOSIS — J40 Bronchitis, not specified as acute or chronic: Secondary | ICD-10-CM | POA: Diagnosis present

## 2014-04-06 DIAGNOSIS — I10 Essential (primary) hypertension: Secondary | ICD-10-CM | POA: Diagnosis present

## 2014-04-06 DIAGNOSIS — E785 Hyperlipidemia, unspecified: Secondary | ICD-10-CM | POA: Diagnosis present

## 2014-04-06 DIAGNOSIS — I5022 Chronic systolic (congestive) heart failure: Secondary | ICD-10-CM

## 2014-04-06 DIAGNOSIS — J45909 Unspecified asthma, uncomplicated: Secondary | ICD-10-CM | POA: Diagnosis present

## 2014-04-06 DIAGNOSIS — E662 Morbid (severe) obesity with alveolar hypoventilation: Secondary | ICD-10-CM | POA: Diagnosis present

## 2014-04-06 DIAGNOSIS — L02215 Cutaneous abscess of perineum: Secondary | ICD-10-CM | POA: Diagnosis present

## 2014-04-06 DIAGNOSIS — B009 Herpesviral infection, unspecified: Secondary | ICD-10-CM | POA: Diagnosis present

## 2014-04-06 DIAGNOSIS — Z7982 Long term (current) use of aspirin: Secondary | ICD-10-CM

## 2014-04-06 DIAGNOSIS — K30 Functional dyspepsia: Secondary | ICD-10-CM | POA: Diagnosis present

## 2014-04-06 DIAGNOSIS — I5023 Acute on chronic systolic (congestive) heart failure: Secondary | ICD-10-CM | POA: Diagnosis not present

## 2014-04-06 DIAGNOSIS — J449 Chronic obstructive pulmonary disease, unspecified: Secondary | ICD-10-CM | POA: Diagnosis present

## 2014-04-06 DIAGNOSIS — E119 Type 2 diabetes mellitus without complications: Secondary | ICD-10-CM | POA: Diagnosis present

## 2014-04-06 DIAGNOSIS — F1010 Alcohol abuse, uncomplicated: Secondary | ICD-10-CM | POA: Diagnosis present

## 2014-04-06 DIAGNOSIS — M7989 Other specified soft tissue disorders: Secondary | ICD-10-CM

## 2014-04-06 DIAGNOSIS — F17210 Nicotine dependence, cigarettes, uncomplicated: Secondary | ICD-10-CM | POA: Diagnosis present

## 2014-04-06 DIAGNOSIS — Z6831 Body mass index (BMI) 31.0-31.9, adult: Secondary | ICD-10-CM | POA: Diagnosis not present

## 2014-04-06 DIAGNOSIS — I509 Heart failure, unspecified: Secondary | ICD-10-CM

## 2014-04-06 DIAGNOSIS — E871 Hypo-osmolality and hyponatremia: Secondary | ICD-10-CM | POA: Diagnosis present

## 2014-04-06 DIAGNOSIS — Z9119 Patient's noncompliance with other medical treatment and regimen: Secondary | ICD-10-CM | POA: Diagnosis present

## 2014-04-06 DIAGNOSIS — R0601 Orthopnea: Secondary | ICD-10-CM

## 2014-04-06 DIAGNOSIS — Z720 Tobacco use: Secondary | ICD-10-CM | POA: Diagnosis present

## 2014-04-06 HISTORY — DX: Heart failure, unspecified: I50.9

## 2014-04-06 LAB — GLUCOSE, CAPILLARY
GLUCOSE-CAPILLARY: 270 mg/dL — AB (ref 70–99)
Glucose-Capillary: 120 mg/dL — ABNORMAL HIGH (ref 70–99)

## 2014-04-06 LAB — I-STAT TROPONIN, ED: TROPONIN I, POC: 0.02 ng/mL (ref 0.00–0.08)

## 2014-04-06 LAB — BASIC METABOLIC PANEL
Anion gap: 9 (ref 5–15)
BUN: 10 mg/dL (ref 6–23)
CHLORIDE: 96 meq/L (ref 96–112)
CO2: 22 mmol/L (ref 19–32)
Calcium: 8.8 mg/dL (ref 8.4–10.5)
Creatinine, Ser: 0.88 mg/dL (ref 0.50–1.35)
GFR calc Af Amer: >90 mL/min (ref >90)
GFR calc non Af Amer: >90 mL/min (ref >90)
GLUCOSE: 74 mg/dL (ref 70–99)
Potassium: 4.5 mmol/L (ref 3.5–5.1)
Sodium: 127 mmol/L — ABNORMAL LOW (ref 135–145)

## 2014-04-06 LAB — CBC
HEMATOCRIT: 33.3 % — AB (ref 39.0–52.0)
HEMOGLOBIN: 11.5 g/dL — AB (ref 13.0–17.0)
MCH: 29.3 pg (ref 26.0–34.0)
MCHC: 34.5 g/dL (ref 30.0–36.0)
MCV: 84.7 fL (ref 78.0–100.0)
Platelets: 347 10*3/uL (ref 150–400)
RBC: 3.93 MIL/uL — ABNORMAL LOW (ref 4.22–5.81)
RDW: 15.7 % — ABNORMAL HIGH (ref 11.5–15.5)
WBC: 5.6 10*3/uL (ref 4.0–10.5)

## 2014-04-06 LAB — BRAIN NATRIURETIC PEPTIDE: B Natriuretic Peptide: 1329.3 pg/mL — ABNORMAL HIGH (ref 0.0–100.0)

## 2014-04-06 MED ORDER — SPIRONOLACTONE 25 MG PO TABS
25.0000 mg | ORAL_TABLET | Freq: Every day | ORAL | Status: DC
  Administered 2014-04-07 – 2014-04-08 (×2): 25 mg via ORAL
  Filled 2014-04-06 (×2): qty 1

## 2014-04-06 MED ORDER — LORAZEPAM 1 MG PO TABS: 1.0000 mg | ORAL_TABLET | Freq: Four times a day (QID) | ORAL | Status: DC | PRN

## 2014-04-06 MED ORDER — VITAMIN B-1 100 MG PO TABS
100.0000 mg | ORAL_TABLET | Freq: Every day | ORAL | Status: DC
  Administered 2014-04-06 – 2014-04-08 (×3): 100 mg via ORAL
  Filled 2014-04-06 (×3): qty 1

## 2014-04-06 MED ORDER — THIAMINE HCL 100 MG/ML IJ SOLN
100.0000 mg | Freq: Every day | INTRAMUSCULAR | Status: DC
  Filled 2014-04-06 (×2): qty 1

## 2014-04-06 MED ORDER — SODIUM CHLORIDE 0.9 % IJ SOLN
3.0000 mL | Freq: Two times a day (BID) | INTRAMUSCULAR | Status: DC
  Administered 2014-04-06 – 2014-04-08 (×4): 3 mL via INTRAVENOUS

## 2014-04-06 MED ORDER — ASPIRIN EC 81 MG PO TBEC
81.0000 mg | DELAYED_RELEASE_TABLET | Freq: Every day | ORAL | Status: DC
  Administered 2014-04-07 – 2014-04-08 (×2): 81 mg via ORAL
  Filled 2014-04-06 (×2): qty 1

## 2014-04-06 MED ORDER — ADULT MULTIVITAMIN W/MINERALS CH
1.0000 | ORAL_TABLET | Freq: Every day | ORAL | Status: DC
  Administered 2014-04-06 – 2014-04-08 (×3): 1 via ORAL
  Filled 2014-04-06 (×3): qty 1

## 2014-04-06 MED ORDER — ACETAMINOPHEN 325 MG PO TABS: 650.0000 mg | ORAL_TABLET | ORAL | Status: DC | PRN

## 2014-04-06 MED ORDER — CARVEDILOL 25 MG PO TABS
25.0000 mg | ORAL_TABLET | Freq: Two times a day (BID) | ORAL | Status: DC
  Administered 2014-04-07 – 2014-04-08 (×3): 25 mg via ORAL
  Filled 2014-04-06 (×5): qty 1

## 2014-04-06 MED ORDER — METFORMIN HCL 500 MG PO TABS
1000.0000 mg | ORAL_TABLET | Freq: Two times a day (BID) | ORAL | Status: DC
  Administered 2014-04-07: 1000 mg via ORAL
  Filled 2014-04-06 (×3): qty 2

## 2014-04-06 MED ORDER — LORAZEPAM 2 MG/ML IJ SOLN: 1.0000 mg | Freq: Four times a day (QID) | INTRAMUSCULAR | Status: DC | PRN

## 2014-04-06 MED ORDER — SIMVASTATIN 40 MG PO TABS
40.0000 mg | ORAL_TABLET | Freq: Every day | ORAL | Status: DC
  Administered 2014-04-06 – 2014-04-08 (×3): 40 mg via ORAL
  Filled 2014-04-06 (×3): qty 1

## 2014-04-06 MED ORDER — ENOXAPARIN SODIUM 40 MG/0.4ML ~~LOC~~ SOLN
40.0000 mg | SUBCUTANEOUS | Status: DC
  Administered 2014-04-06 – 2014-04-07 (×2): 40 mg via SUBCUTANEOUS
  Filled 2014-04-06 (×3): qty 0.4

## 2014-04-06 MED ORDER — LISINOPRIL 20 MG PO TABS
20.0000 mg | ORAL_TABLET | Freq: Once | ORAL | Status: AC
  Administered 2014-04-06: 20 mg via ORAL
  Filled 2014-04-06: qty 1

## 2014-04-06 MED ORDER — SODIUM CHLORIDE 0.9 % IJ SOLN: 3.0000 mL | INTRAMUSCULAR | Status: DC | PRN

## 2014-04-06 MED ORDER — LISINOPRIL 20 MG PO TABS
20.0000 mg | ORAL_TABLET | Freq: Every day | ORAL | Status: DC
  Filled 2014-04-06: qty 1

## 2014-04-06 MED ORDER — ONDANSETRON HCL 4 MG/2ML IJ SOLN: 4.0000 mg | Freq: Four times a day (QID) | INTRAMUSCULAR | Status: DC | PRN

## 2014-04-06 MED ORDER — POTASSIUM CHLORIDE CRYS ER 10 MEQ PO TBCR
10.0000 meq | EXTENDED_RELEASE_TABLET | Freq: Every day | ORAL | Status: DC
  Administered 2014-04-06 – 2014-04-08 (×3): 10 meq via ORAL
  Filled 2014-04-06 (×3): qty 1

## 2014-04-06 MED ORDER — FUROSEMIDE 10 MG/ML IJ SOLN
40.0000 mg | Freq: Once | INTRAMUSCULAR | Status: AC
  Administered 2014-04-06: 40 mg via INTRAVENOUS
  Filled 2014-04-06: qty 4

## 2014-04-06 MED ORDER — FUROSEMIDE 10 MG/ML IJ SOLN
40.0000 mg | Freq: Two times a day (BID) | INTRAMUSCULAR | Status: DC
  Administered 2014-04-06 – 2014-04-07 (×2): 40 mg via INTRAVENOUS
  Filled 2014-04-06 (×3): qty 4

## 2014-04-06 MED ORDER — CARVEDILOL 25 MG PO TABS
25.0000 mg | ORAL_TABLET | Freq: Once | ORAL | Status: AC
  Administered 2014-04-06: 25 mg via ORAL
  Filled 2014-04-06: qty 1

## 2014-04-06 MED ORDER — FOLIC ACID 1 MG PO TABS
1.0000 mg | ORAL_TABLET | Freq: Every day | ORAL | Status: DC
Start: 2014-04-06 — End: 2014-04-08
  Administered 2014-04-06 – 2014-04-08 (×3): 1 mg via ORAL
  Filled 2014-04-06 (×3): qty 1

## 2014-04-06 MED ORDER — GLIMEPIRIDE 4 MG PO TABS
4.0000 mg | ORAL_TABLET | Freq: Every day | ORAL | Status: DC
  Administered 2014-04-07: 4 mg via ORAL
  Filled 2014-04-06 (×2): qty 1

## 2014-04-06 MED ORDER — SODIUM CHLORIDE 0.9 % IV SOLN: 250.0000 mL | INTRAVENOUS | Status: DC | PRN

## 2014-04-06 NOTE — ED Notes (Signed)
C/O SOB when laying down since yesterday.  Was placed on spironolactone 11/12 - told doctor he's had SOB with it in the past, but PCP "said it was fine".  Also has been having LLE swelling - states PCP is aware.

## 2014-04-06 NOTE — H&P (Signed)
[facility] History and Physical  Keonne Copping [id number] DOB: 1970-04-14 DOA: 04/06/2014  Referring physician: Thurnell Garbe PCP: Marlou Sa [name], MD   Chief Complaint: shortness of breath, leg swelling  HPI: Austin Alexander is a 43 y.o. male  With h/o cardiomyopathy, EF about 30% in June of this year, HTN, DM, heavy alcohol and tobacco use presents with right greater than left leg edema, DOE, orthopnea. On spironolactone, ACE inhibitor, carvedilol, but not lasix. Has seen Dr. Terrence Dupont in the past. The plan was to repeat echo and if still low, refer for ICD.  Patient has not had repeat echo. Admits to dietary indiscretion with salt. Also, continued heavy alcohol use. Denies illicit drugs. Denies CP, palpitations, cough.   Review of Systems:  Systems reviewed. As above, otherwise negative.  Past Medical History  Diagnosis Date  . Bronchitis   . Croup   . Tobacco abuse   . Dizziness   . Abscess of perineum   . Heartburn   . Indigestion   . H/O folliculitis   . 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 Alcohol abuse   . Hypertension     variable in nature  . Hyperlipidemia   . Multiple excoriations     multifactorial in origin  . Mild obesity   . Dietary noncompliance     History of,  . Situational stress     secondary to financial issues  . Erectile dysfunction     Multifactorial. Improved with Cialis. New onset.  . Cystic acne     with acne keloidosis  . Diabetes mellitus without complication     Type 2, uncontrolled with questionable improvement  . 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 EKG, abnormal     History of, with negative cardiac evaluation by history.  [name] COPD (chronic obstructive pulmonary disease)     moderate obstruction with low vital capacity  . Asthma     History of,  . CHF (congestive heart failure)    Past Surgical History  Procedure Laterality Date  . Incision and drainage perirectal abscess N/A 02/17/2014   Procedure: IRRIGATION AND DEBRIDEMENT PERIRECTAL  AND SCROTAL ABSCESS;  Surgeon: Coralie Keens, MD;  Location: [place];  Service: General;  Laterality: N/A;   Social History:  reports that he has been smoking Cigarettes.  He has a 20 pack-year smoking history. He does not have any smokeless tobacco history on file. He reports that he drinks about 7.2 oz of alcohol per week. He reports that he does not use illicit drugs.  No Known Allergies  Family History  Problem Relation Age of Onset  . Diabetes type II Mother   . Hypertension Mother      Prior to Admission medications   Medication Sig Start Date End Date Taking? Authorizing Provider  aspirin EC 81 MG EC tablet Take 1 tablet (81 mg total) by mouth daily. 09/23/13  Yes Ejiroghene Arlyce Dice, MD  carvedilol (COREG) 25 MG tablet Take 25 mg by mouth 2 (two) times daily with a meal.   Yes Historical Provider, MD  glimepiride (AMARYL) 4 MG tablet Take 4 mg by mouth daily. 03/18/14  Yes Historical Provider, MD  lisinopril (PRINIVIL,ZESTRIL) 20 MG tablet Take 20 mg by mouth daily.   Yes Historical Provider, MD  metFORMIN (GLUCOPHAGE) 1000 MG tablet Take 1,000 mg by mouth 2 (two) times daily with a meal.   Yes Historical Provider, MD  Multiple Vitamins-Minerals (CENTRUM ADULTS PO)  Take 1 tablet by mouth daily.   Yes Historical Provider, MD  simvastatin (ZOCOR) 40 MG tablet Take 1 tablet (40 mg total) by mouth daily. 09/23/13  Yes Ejiroghene Arlyce Dice, MD  spironolactone (ALDACTONE) 25 MG tablet Take 1 tablet (25 mg total) by mouth daily. 09/23/13  Yes Ejiroghene Arlyce Dice, MD   Physical Exam: Filed Vitals:   04/06/14 1506 04/06/14 1507 04/06/14 1508 04/06/14 1509  BP:      Pulse: 109 109 110 108  Temp:      TempSrc:      Resp:      Height:      Weight:      SpO2: 99% 99% 98% 100%    Wt Readings from Last 3 Encounters:  04/06/14 106.595 kg (235 lb)  02/19/14 99.5 kg (219 lb 5.7 oz)  11/04/13 99.519 kg (219 lb 6.4 oz)    BP 127/87 mmHg   Pulse 92  Temp(Src) 98 F (36.7 C) (Oral)  Resp 20  Ht 6' (1.829 m)  Wt 104.736 kg (230 lb 14.4 oz)  BMI 31.31 kg/m2  SpO2 99%  General Appearance:    Alert, cooperative, no distress, appears stated age  Head:    Normocephalic, without obvious abnormality, atraumatic  Eyes:    PERRL, conjunctiva/corneas clear, EOM's intact,      Nose:   Nares normal, septum midline, mucosa normal, no drainage   or sinus tenderness  Throat:   Lips, mucosa, and tongue normal; teeth and gums normal  Neck:   Supple, symmetrical, trachea midline, no adenopathy;       thyroid:  No enlargement/tenderness/nodules; no carotid   bruit or JVD  Back:     Symmetric, no curvature, ROM normal, no CVA tenderness  Lungs:     Clear to auscultation bilaterally, respirations unlabored  Chest wall:    No tenderness or deformity  Heart:    Regular rate and rhythm, S1 and S2 normal, no murmur, rub   or gallop  Abdomen:     Soft, non-tender, bowel sounds active all four quadrants,    no masses, no organomegaly  Genitalia:    deferred  Rectal:    deferred  Extremities:   Extremities normal, atraumatic, no cyanosis. Right greater than left pitting edema  Pulses:   2+ and symmetric all extremities  Skin:   Skin color, texture, turgor normal, no rashes or lesions  Lymph nodes:   Cervical, supraclavicular, and axillary nodes normal  Neurologic:   CNII-XII intact. Normal strength, sensation and reflexes      throughout             Psych: normal affect  Labs on Admission:  Basic Metabolic Panel:  Recent Labs Lab 04/06/14 1209  NA 127*  K 4.5  CL 96  CO2 22  GLUCOSE 74  BUN 10  CREATININE 0.88  CALCIUM 8.8   Liver Function Tests: No results for input(s): AST, ALT, ALKPHOS, BILITOT, PROT, ALBUMIN in the last 168 hours. No results for input(s): LIPASE, AMYLASE in the last 168 hours. No results for input(s): AMMONIA in the last 168 hours. CBC:  Recent Labs Lab 04/06/14 1209  WBC 5.6  HGB 11.5*  HCT 33.3*   MCV 84.7  PLT 347   Cardiac Enzymes: No results for input(s): CKTOTAL, CKMB, CKMBINDEX, TROPONINI in the last 168 hours.  BNP (last 3 results)  Recent Labs  09/22/13 0344  PROBNP 1845.0*   CBG: No results for input(s): GLUCAP in the last 168 hours.  Radiological Exams on Admission: Dg Chest 2 View  04/06/2014   CLINICAL DATA:  Shortness of breath at night  EXAM: CHEST  2 VIEW  COMPARISON:  09/22/2013  FINDINGS: Cardiac shadow is mildly enlarged. The lungs are well aerated bilaterally. Some fibrotic changes are noted in the bases bilaterally. No sizable effusion is seen. Very minimal vascular congestion is noted.  IMPRESSION: Minimal vascular congestion with chronic changes in the bases bilaterally.   Electronically Signed   By: Inez Catalina M.D.   On: 04/06/2014 14:40    EKG: sinus tachycardia  Assessment/Plan  Principal Problem:   Acute on chronic systolic heart failure: repeat echo. Lasix 40 iv bid. Continue lisinopril, coreg and spironolactone. Needs CHF teaching. Tele. Daily weights. Active Problems:   HTN (hypertension): resume home meds and monotor   Tobacco abuse: counseled against. Declines patch   Alcohol abuse: denies h/o DTs. Monitor CIWAs. thiamine   DM type 2 (diabetes mellitus, type 2): resume home meds and monitor   [facility] [email or web address] password [facility]

## 2014-04-06 NOTE — Progress Notes (Signed)
*  PRELIMINARY RESULTS* Vascular Ultrasound Left lower extremity venous duplex has been completed.  Preliminary findings: no evidence of DVT  Landry Mellow, RDMS, RVT  04/06/2014, 2:53 PM

## 2014-04-06 NOTE — ED Notes (Signed)
Lab notified this nurse resent down blood for BNP

## 2014-04-06 NOTE — ED Notes (Signed)
Pt sent here from ucc, having sob that gets worse when lying down. Recently started on spironolactone for fluid retention. Has non productive cough and swelling to left leg. Airway intact at triage and ekg done.

## 2014-04-06 NOTE — ED Provider Notes (Signed)
CSN: [id number]     Arrival date & time 04/06/14  1137 History   First MD Initiated Contact with Patient 04/06/14 1241     Chief Complaint  Patient presents with  . Shortness of Breath  . Leg Swelling      HPI Pt was seen at 1245. Per pt, c/o gradual onset and worsening of persistent SOB and peripheral edema for the past 2 days. Pt states his SOB worsens when he lays down flat. States he has been waking up at night and having to sit up for the past 2 days because of his SOB. SOB improves when he sits up. States he was recently started on a new "fluid pill" but "doesn't think it's working." Pt thinks the swelling his "worse in my left leg" than his right leg. Denies CP/palpitations, no cough, no abd pain, no N/V/D. Pt states he has not taken any of his meds today.     Past Medical History  Diagnosis Date  . Bronchitis   . Croup   . Tobacco abuse   . Dizziness   . Abscess of perineum   . Heartburn   . Indigestion   . H/O folliculitis   . 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 Alcohol abuse   . Hypertension     variable in nature  . Hyperlipidemia   . Multiple excoriations     multifactorial in origin  . Mild obesity   . Dietary noncompliance     History of,  . Situational stress     secondary to financial issues  . Erectile dysfunction     Multifactorial. Improved with Cialis. New onset.  . Cystic acne     with acne keloidosis  . Diabetes mellitus without complication     Type 2, uncontrolled with questionable improvement  . 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 EKG, abnormal     History of, with negative cardiac evaluation by history.  [name] COPD (chronic obstructive pulmonary disease)     moderate obstruction with low vital capacity  . Asthma     History of,  . CHF (congestive heart failure)    Past Surgical History  Procedure Laterality Date  . Incision and drainage perirectal abscess N/A 02/17/2014    Procedure: IRRIGATION  AND DEBRIDEMENT PERIRECTAL  AND SCROTAL ABSCESS;  Surgeon: Coralie Keens, MD;  Location: [facility];  Service: General;  Laterality: N/A;   Family History  Problem Relation Age of Onset  . Diabetes type II Mother   . Hypertension Mother    History  Substance Use Topics  . Smoking status: Current Every Day Smoker -- 1.00 packs/day for 20 years    Types: Cigarettes  . Smokeless tobacco: Not on file  . Alcohol Use: 7.2 oz/week    10 Cans of beer, 2 Shots of liquor per week     Comment: As of 04/06/14 - states occasional use    Review of Systems ROS: Statement: All systems negative except as marked or noted in the HPI; Constitutional: Negative for fever and chills. ; ; Eyes: Negative for eye pain, redness and discharge. ; ; ENMT: Negative for ear pain, hoarseness, nasal congestion, sinus pressure and sore throat. ; ; Cardiovascular: Negative for chest pain, palpitations, diaphoresis, +dyspnea and peripheral edema. ; ; Respiratory: Negative for cough, wheezing and stridor. ; ; Gastrointestinal: Negative for nausea, vomiting, diarrhea, abdominal pain, blood in stool, hematemesis, jaundice and rectal bleeding. . ; ;  Genitourinary: Negative for dysuria, flank pain and hematuria. ; ; Musculoskeletal: Negative for back pain and neck pain. Negative for swelling and trauma.; ; Skin: Negative for pruritus, rash, abrasions, blisters, bruising and skin lesion.; ; Neuro: Negative for headache, lightheadedness and neck stiffness. Negative for weakness, altered level of consciousness , altered mental status, extremity weakness, paresthesias, involuntary movement, seizure and syncope.      Allergies  Review of patient's allergies indicates no known allergies.  Home Medications   Prior to Admission medications   Medication Sig Start Date End Date Taking? Authorizing Provider  aspirin EC 81 MG EC tablet Take 1 tablet (81 mg total) by mouth daily. 09/23/13   Ejiroghene Arlyce Dice, MD  carvedilol (COREG) 25 MG  tablet Take 25 mg by mouth 2 (two) times daily with a meal.    Historical Provider, MD  HYDROcodone-acetaminophen (NORCO/VICODIN) 5-325 MG per tablet Take 1 tablet by mouth every 4 (four) hours as needed for moderate pain. 02/21/14   Annita Brod, MD  lisinopril (PRINIVIL,ZESTRIL) 20 MG tablet Take 20 mg by mouth daily.    Historical Provider, MD  metFORMIN (GLUCOPHAGE) 500 MG tablet Take 2 tablets (1,000 mg total) by mouth 2 (two) times daily with a meal. 02/20/14   Annita Brod, MD  Multiple Vitamins-Minerals (CENTRUM ADULTS PO) Take 1 tablet by mouth daily.    Historical Provider, MD  saccharomyces boulardii (FLORASTOR) 250 MG capsule Take 1 capsule (250 mg total) by mouth 2 (two) times daily. 11/04/13   Donne Hazel, MD  simvastatin (ZOCOR) 40 MG tablet Take 1 tablet (40 mg total) by mouth daily. 09/23/13   Ejiroghene Arlyce Dice, MD  spironolactone (ALDACTONE) 25 MG tablet Take 1 tablet (25 mg total) by mouth daily. 09/23/13   Ejiroghene E Emokpae, MD   BP 168/116 mmHg  Pulse 110  Temp(Src) 98 F (36.7 C) (Oral)  Resp 23  Ht 6' (1.829 m)  Wt 235 lb (106.595 kg)  BMI 31.86 kg/m2  SpO2 96% Physical Exam  1250: Physical examination:  Nursing notes reviewed; Vital signs and O2 SAT reviewed;  Constitutional: Well developed, Well nourished, Well hydrated, In no acute distress; Head:  Normocephalic, atraumatic; Eyes: EOMI, PERRL, No scleral icterus; ENMT: Mouth and pharynx normal, Mucous membranes moist; Neck: Supple, Full range of motion, No lymphadenopathy; Cardiovascular: Regular rate and rhythm, No gallop; Respiratory: Breath sounds coarse & equal bilaterally, No wheezes.  Speaking full sentences with ease, Normal respiratory effort/excursion; Chest: Nontender, Movement normal; Abdomen: Soft, Nontender, Nondistended, Normal bowel sounds; Genitourinary: No CVA tenderness; Extremities: Pulses normal, No tenderness, +1 pedal edema LLE, +tr pedal edema RLE. NT bilat calf areas, no rash..;  Neuro: AA&Ox3, Major CN grossly intact.  Speech clear. No gross focal motor or sensory deficits in extremities.; Skin: Color normal, Warm, Dry.   ED Course  Procedures     EKG Interpretation   Date/Time:  Saturday April 06 2014 11:45:29 EST Ventricular Rate:  115 PR Interval:  164 QRS Duration: 82 QT Interval:  426 QTC Calculation: 589 R Axis:   28 Text Interpretation:  Sinus tachycardia T wave abnormality Lateral leads  Artifact When compared with ECG of 09/22/2013 No significant change was  found Confirmed by [facility]  MD, Nunzio Cory [phone]) on 04/06/2014 1:01:34 PM      MDM  MDM Reviewed: previous chart, nursing note and vitals Reviewed previous: labs and ECG Interpretation: labs, ECG and x-ray Total time providing critical care: 30-74 minutes. This excludes time spent performing separately reportable  procedures and services. Consults: admitting MD   CRITICAL CARE Performed by: Alfonzo Feller Total critical care time: 35 Critical care time was exclusive of separately billable procedures and treating other patients. Critical care was necessary to treat or prevent imminent or life-threatening deterioration. Critical care was time spent personally by me on the following activities: development of treatment plan with patient and/or surrogate as well as nursing, discussions with consultants, evaluation of patient's response to treatment, examination of patient, obtaining history from patient or surrogate, ordering and performing treatments and interventions, ordering and review of laboratory studies, ordering and review of radiographic studies, pulse oximetry and re-evaluation of patient's condition.   Results for orders placed or performed during the hospital encounter of 04/06/14  CBC  Result Value Ref Range   WBC 5.6 4.0 - 10.5 K/uL   RBC 3.93 (L) 4.22 - 5.81 MIL/uL   Hemoglobin 11.5 (L) 13.0 - 17.0 g/dL   HCT 33.3 (L) 39.0 - 52.0 %   MCV 84.7 78.0 - 100.0 fL    MCH 29.3 26.0 - 34.0 pg   MCHC 34.5 30.0 - 36.0 g/dL   RDW 15.7 (H) 11.5 - 15.5 %   Platelets 347 150 - 400 K/uL  Basic metabolic panel  Result Value Ref Range   Sodium 127 (L) 135 - 145 mmol/L   Potassium 4.5 3.5 - 5.1 mmol/L   Chloride 96 96 - 112 mEq/L   CO2 22 19 - 32 mmol/L   Glucose, Bld 74 70 - 99 mg/dL   BUN 10 6 - 23 mg/dL   Creatinine, Ser 0.88 0.50 - 1.35 mg/dL   Calcium 8.8 8.4 - 10.5 mg/dL   GFR calc non Af Amer >90 >90 mL/min   GFR calc Af Amer >90 >90 mL/min   Anion gap 9 5 - 15  BNP (order ONLY if patient complains of dyspnea/SOB AND you have documented it for THIS visit)  Result Value Ref Range   B Natriuretic Peptide 1329.3 (H) 0.0 - 100.0 pg/mL  I-stat troponin, ED (not at [facility])  Result Value Ref Range   Troponin i, poc 0.02 0.00 - 0.08 ng/mL   Comment 3           Dg Chest 2 View 04/06/2014   CLINICAL DATA:  Shortness of breath at night  EXAM: CHEST  2 VIEW  COMPARISON:  09/22/2013  FINDINGS: Cardiac shadow is mildly enlarged. The lungs are well aerated bilaterally. Some fibrotic changes are noted in the bases bilaterally. No sizable effusion is seen. Very minimal vascular congestion is noted.  IMPRESSION: Minimal vascular congestion with chronic changes in the bases bilaterally.   Electronically Signed   By: Inez Catalina M.D.   On: 04/06/2014 14:40    Expand All Collapse All   *PRELIMINARY RESULTS* Vascular Ultrasound Left lower extremity venous duplex has been completed. Preliminary findings: no evidence of DVT  Landry Mellow, RDMS, RVT 04/06/2014, 2:53 PM       1500:  New hyponatremia on labs. BNP elevated with pulmonary vascular congestion on CXR; will dose IV lasix. Vascular US LLE negative for DVT. Pt given his home BP meds while in the ED. Dx and testing d/w pt.  Questions answered.  Verb understanding, agreeable to admit.  T/C to [facility]. Conley Canal, case discussed, including:  HPI, pertinent PM/SHx, VS/PE, dx testing, ED course and treatment:   Agreeable to admit, requests to write temporary orders, obtain inpt tele bed to team [facility].   Francine Graven, DO 04/08/14 1224

## 2014-04-06 NOTE — ED Provider Notes (Signed)
Austin Alexander is a 43 y.o. male who presents to Urgent Care today for orthopnea and leg swelling. Patient restarted spironolactone on November 12 after being discharged from the hospital for scrotal abscess. He has a history of CHF with an ejection fraction of 25-30%. Recently he notes worsening leg swelling and shortness of breath especially when laying flat at night. However he notes that his left leg is much more swollen than his right. This has not happened before. He denies any chest pains. He continues taking his medications as scheduled and denies eating excessively recently.   Past Medical History  Diagnosis Date  . Bronchitis   . Croup   . Tobacco abuse   . Dizziness   . Abscess of perineum   . Heartburn   . Indigestion   . H/O folliculitis   . 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 Alcohol abuse   . Hypertension     variable in nature  . Hyperlipidemia   . Multiple excoriations     multifactorial in origin  . Mild obesity   . Dietary noncompliance     History of,  . Situational stress     secondary to financial issues  . Erectile dysfunction     Multifactorial. Improved with Cialis. New onset.  . Cystic acne     with acne keloidosis  . Diabetes mellitus without complication     Type 2, uncontrolled with questionable improvement  . 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 EKG, abnormal     History of, with negative cardiac evaluation by history.  [name] COPD (chronic obstructive pulmonary disease)     moderate obstruction with low vital capacity  . Asthma     History of,  . CHF (congestive heart failure)    Past Surgical History  Procedure Laterality Date  . Incision and drainage perirectal abscess N/A 02/17/2014    Procedure: IRRIGATION AND DEBRIDEMENT PERIRECTAL  AND SCROTAL ABSCESS;  Surgeon: Coralie Keens, MD;  Location: [place];  Service: General;  Laterality: N/A;   History  Substance Use Topics  . Smoking status: Current  Every Day Smoker -- 1.00 packs/day for 20 years    Types: Cigarettes  . Smokeless tobacco: Not on file  . Alcohol Use: 7.2 oz/week    10 Cans of beer, 2 Shots of liquor per week     Comment: As of 04/06/14 - states occasional use   ROS as above Medications: No current facility-administered medications for this encounter.   Current Outpatient Prescriptions  Medication Sig Dispense Refill  . aspirin EC 81 MG EC tablet Take 1 tablet (81 mg total) by mouth daily. 30 tablet 6  . carvedilol (COREG) 25 MG tablet Take 25 mg by mouth 2 (two) times daily with a meal.    . lisinopril (PRINIVIL,ZESTRIL) 20 MG tablet Take 20 mg by mouth daily.    . metFORMIN (GLUCOPHAGE) 500 MG tablet Take 2 tablets (1,000 mg total) by mouth 2 (two) times daily with a meal. 60 tablet 0  . simvastatin (ZOCOR) 40 MG tablet Take 1 tablet (40 mg total) by mouth daily. 30 tablet 0  . spironolactone (ALDACTONE) 25 MG tablet Take 1 tablet (25 mg total) by mouth daily. 30 tablet 0  . HYDROcodone-acetaminophen (NORCO/VICODIN) 5-325 MG per tablet Take 1 tablet by mouth every 4 (four) hours as needed for moderate pain. 20 tablet 0  . Multiple Vitamins-Minerals (CENTRUM ADULTS PO) Take 1 tablet  by mouth daily.    [name] saccharomyces boulardii (FLORASTOR) 250 MG capsule Take 1 capsule (250 mg total) by mouth 2 (two) times daily. 60 capsule 0   No Known Allergies   Exam:  BP 168/112 mmHg  Pulse 108  Temp(Src) 98.7 F (37.1 C) (Oral)  Resp 20  SpO2 95% Gen: Well NAD HEENT: EOMI,  MMM Lungs: Normal work of breathing. CTABL no crackles heard Heart: Tachycardia but regular no MRG Abd: NABS, Soft. Nondistended, Nontender Exts: Right lower extremity trace edema left 2+ edema calf diameters larger in the left compared to the right.  No results found for this or any previous visit (from the past 24 hour(s)). No results found.  Assessment and Plan: 43 y.o. male with left leg swelling. This is either DVT/PE  or exacerbation of  CHF. Plan to transfer to the emergency room for further evaluation and management.  Discussed warning signs or symptoms. Please see discharge instructions. Patient expresses understanding.     Gregor Hams, MD 04/06/14 [id number]

## 2014-04-06 NOTE — ED Notes (Signed)
Report called to Gabriel Cirri, ED First Nurse.

## 2014-04-07 DIAGNOSIS — I5023 Acute on chronic systolic (congestive) heart failure: Principal | ICD-10-CM

## 2014-04-07 LAB — BASIC METABOLIC PANEL
Anion gap: 9 (ref 5–15)
BUN: 20 mg/dL (ref 6–23)
CO2: 25 mmol/L (ref 19–32)
CREATININE: 1.12 mg/dL (ref 0.50–1.35)
Calcium: 8.5 mg/dL (ref 8.4–10.5)
Chloride: 98 mEq/L (ref 96–112)
GFR calc Af Amer: >90 mL/min (ref >90)
GFR calc non Af Amer: 78 mL/min — ABNORMAL LOW (ref >90)
GLUCOSE: 181 mg/dL — AB (ref 70–99)
Potassium: 4.3 mmol/L (ref 3.5–5.1)
SODIUM: 132 mmol/L — AB (ref 135–145)

## 2014-04-07 LAB — GLUCOSE, CAPILLARY
Glucose-Capillary: 247 mg/dL — ABNORMAL HIGH (ref 70–99)
Glucose-Capillary: 254 mg/dL — ABNORMAL HIGH (ref 70–99)
Glucose-Capillary: 74 mg/dL (ref 70–99)
Glucose-Capillary: 202 mg/dL — ABNORMAL HIGH (ref 70–99)
Glucose-Capillary: 114 mg/dL — ABNORMAL HIGH (ref 70–99)

## 2014-04-07 MED ORDER — LISINOPRIL 10 MG PO TABS
10.0000 mg | ORAL_TABLET | Freq: Every day | ORAL | Status: DC
  Administered 2014-04-07 – 2014-04-08 (×2): 10 mg via ORAL
  Filled 2014-04-07 (×2): qty 1

## 2014-04-07 MED ORDER — INSULIN ASPART 100 UNIT/ML ~~LOC~~ SOLN: 0.0000 [IU] | Freq: Every day | SUBCUTANEOUS | Status: DC

## 2014-04-07 MED ORDER — FUROSEMIDE 40 MG PO TABS
40.0000 mg | ORAL_TABLET | Freq: Every day | ORAL | Status: DC
  Administered 2014-04-07 – 2014-04-08 (×2): 40 mg via ORAL
  Filled 2014-04-07 (×2): qty 1

## 2014-04-07 MED ORDER — INSULIN ASPART 100 UNIT/ML ~~LOC~~ SOLN
0.0000 [IU] | Freq: Three times a day (TID) | SUBCUTANEOUS | Status: DC
  Administered 2014-04-07: 8 [IU] via SUBCUTANEOUS
  Administered 2014-04-08 (×2): 3 [IU] via SUBCUTANEOUS

## 2014-04-07 MED ORDER — FUROSEMIDE 10 MG/ML IJ SOLN: 20.0000 mg | Freq: Two times a day (BID) | INTRAMUSCULAR | Status: DC

## 2014-04-07 NOTE — Progress Notes (Signed)
*  PRELIMINARY RESULTS* Echocardiogram 2D Echocardiogram has been performed.  Leavy Cella 04/07/2014, 11:50 AM

## 2014-04-07 NOTE — Progress Notes (Signed)
Patient is sleeping peacefully. No c/o chest pain or shob offered. Maintained on telemetry and is in NSR with a heart rate of 97. Will continue to monitor.  Esperanza Heir, RN

## 2014-04-07 NOTE — Progress Notes (Signed)
Patient Demographics  Austin Alexander, is a 43 y.o. male, DOB - Aug 21, 1970, [id number]  Admit date - 04/06/2014   Admitting Physician Delfina Redwood, MD  Outpatient Primary MD for the patient is Marlou Sa, [name], MD  LOS - 1   Chief Complaint  Patient presents with  . Shortness of Breath  . Leg Swelling        Subjective:   Austin Alexander today has, No headache, No chest pain, No abdominal pain - No Nausea, No new weakness tingling or numbness, No Cough - SOB.    Assessment & Plan    1. Acute on chronic systolic heart failure - EF 25%. Noncompliant with diet, continues to smoke and consume alcohol. Counseled, continue beta blocker, his inhibitor along with Lasix which will be transitioned to oral. Has diuresed well. Slight bump in creatinine will monitor. Have requested cardiology to evaluate, will likely at some point require CAD evaluation. Repeat echo pending.    2. Tobacco abuse and alcohol abuse. Counseled to quit both. Continue CIWA protocol.    3.HTN - stable on beta blocker and Ace.    4. DM type II. Is on Amaryl continue, add sliding scale moderate dose for better control.   Lab Results  Component Value Date   HGBA1C 6.8* 02/19/2014   CBG (last 3)   Recent Labs  04/06/14 1654 04/06/14 2110 04/07/14 0627  GLUCAP 120* 270* 247*     5. Dyslipidemia. On statin continue.    Code Status: Full  Family Communication: none  Disposition Plan: Home   Procedures   TTE   Consults  Cards Dr Doylene Canard   Medications  Scheduled Meds: . aspirin EC  81 mg Oral Daily  . carvedilol  25 mg Oral BID WC  . enoxaparin (LOVENOX) injection  40 mg Subcutaneous Q24H  . folic acid  1 mg Oral Daily  . furosemide  40 mg Oral Daily  . glimepiride  4 mg Oral Daily  .  lisinopril  10 mg Oral Daily  . multivitamin with minerals  1 tablet Oral Daily  . potassium chloride  10 mEq Oral Daily  . simvastatin  40 mg Oral Daily  . sodium chloride  3 mL Intravenous Q12H  . spironolactone  25 mg Oral Daily  . thiamine  100 mg Oral Daily   Or  . thiamine  100 mg Intravenous Daily   Continuous Infusions:  PRN Meds:.sodium chloride, acetaminophen, LORazepam **OR** LORazepam, ondansetron (ZOFRAN) IV, sodium chloride  DVT Prophylaxis  Lovenox    Lab Results  Component Value Date   PLT 347 04/06/2014    Antibiotics     Anti-infectives    None          Objective:   Filed Vitals:   04/06/14 1530 04/06/14 2037 04/07/14 0217 04/07/14 0635  BP: 147/92 127/87 130/91 129/92  Pulse: 115 92 90 89  Temp: 98.5 F (36.9 C) 98 F (36.7 C) 97.2 F (36.2 C) 97.9 F (36.6 C)  TempSrc: Oral Oral Axillary Oral  Resp: 20 20 20 16   Height: 6' (1.829 m)     Weight: 104.736 kg (230 lb 14.4 oz)   104.01 kg (229 lb 4.8 oz)  SpO2: 98% 99% 100% 97%  Wt Readings from Last 3 Encounters:  04/07/14 104.01 kg (229 lb 4.8 oz)  02/19/14 99.5 kg (219 lb 5.7 oz)  11/04/13 99.519 kg (219 lb 6.4 oz)     Intake/Output Summary (Last 24 hours) at 04/07/14 1034 Last data filed at 04/07/14 0900  Gross per 24 hour  Intake    600 ml  Output   2400 ml  Net  -1800 ml     Physical Exam  Awake Alert, Oriented X 3, No new F.N deficits, Normal affect [place].AT,PERRAL Supple Neck,No JVD, No cervical lymphadenopathy appriciated.  Symmetrical Chest wall movement, Good air movement bilaterally, +ve rales RRR,No Gallops,Rubs or new Murmurs, No Parasternal Heave +ve B.Sounds, Abd Soft, No tenderness, No organomegaly appriciated, No rebound - guarding or rigidity. No Cyanosis, Clubbing , trace edema, No new Rash or bruise      Data Review   Micro Results No results found for this or any previous visit (from the past 240 hour(s)).  Radiology Reports Dg Chest 2  View  04/06/2014   CLINICAL DATA:  Shortness of breath at night  EXAM: CHEST  2 VIEW  COMPARISON:  09/22/2013  FINDINGS: Cardiac shadow is mildly enlarged. The lungs are well aerated bilaterally. Some fibrotic changes are noted in the bases bilaterally. No sizable effusion is seen. Very minimal vascular congestion is noted.  IMPRESSION: Minimal vascular congestion with chronic changes in the bases bilaterally.   Electronically Signed   By: Inez Catalina M.D.   On: 04/06/2014 14:40     CBC  Recent Labs Lab 04/06/14 1209  WBC 5.6  HGB 11.5*  HCT 33.3*  PLT 347  MCV 84.7  MCH 29.3  MCHC 34.5  RDW 15.7*    Chemistries   Recent Labs Lab 04/06/14 1209 04/07/14 0411  NA 127* 132*  K 4.5 4.3  CL 96 98  CO2 22 25  GLUCOSE 74 181*  BUN 10 20  CREATININE 0.88 1.12  CALCIUM 8.8 8.5   ------------------------------------------------------------------------------------------------------------------ estimated creatinine clearance is 103.9 mL/min (by C-G formula based on Cr of 1.12). ------------------------------------------------------------------------------------------------------------------ No results for input(s): HGBA1C in the last 72 hours. ------------------------------------------------------------------------------------------------------------------ No results for input(s): CHOL, HDL, LDLCALC, TRIG, CHOLHDL, LDLDIRECT in the last 72 hours. ------------------------------------------------------------------------------------------------------------------ No results for input(s): TSH, T4TOTAL, T3FREE, THYROIDAB in the last 72 hours.  Invalid input(s): FREET3 ------------------------------------------------------------------------------------------------------------------ No results for input(s): VITAMINB12, FOLATE, FERRITIN, TIBC, IRON, RETICCTPCT in the last 72 hours.  Coagulation profile No results for input(s): INR, PROTIME in the last 168 hours.  No results for  input(s): DDIMER in the last 72 hours.  Cardiac Enzymes No results for input(s): CKMB, TROPONINI, MYOGLOBIN in the last 168 hours.  Invalid input(s): CK ------------------------------------------------------------------------------------------------------------------ Invalid input(s): POCBNP     Time Spent in minutes  35   Austin Alexander K M.D on 04/07/2014 at 10:34 AM  Between 7am to 7pm - Pager - [phone]  After 7pm go to [email or web address] - [facility] Office  [phone]

## 2014-04-07 NOTE — Plan of Care (Signed)
Problem: Phase I Progression Outcomes Goal: EF % per last Echo/documented,Core Reminder form on chart Outcome: Progressing Repeat Echocardiogram completed as ordered.  Continuing to monitor fluid loss.

## 2014-04-08 LAB — BASIC METABOLIC PANEL
Anion gap: 11 (ref 5–15)
BUN: 20 mg/dL (ref 6–23)
CHLORIDE: 99 meq/L (ref 96–112)
CO2: 24 mmol/L (ref 19–32)
CREATININE: 1.13 mg/dL (ref 0.50–1.35)
Calcium: 8.6 mg/dL (ref 8.4–10.5)
GFR calc Af Amer: 89 mL/min — ABNORMAL LOW (ref >90)
GFR calc non Af Amer: 77 mL/min — ABNORMAL LOW (ref >90)
Glucose, Bld: 165 mg/dL — ABNORMAL HIGH (ref 70–99)
Potassium: 4.4 mmol/L (ref 3.5–5.1)
Sodium: 134 mmol/L — ABNORMAL LOW (ref 135–145)

## 2014-04-08 LAB — GLUCOSE, CAPILLARY
Glucose-Capillary: 198 mg/dL — ABNORMAL HIGH (ref 70–99)
Glucose-Capillary: 196 mg/dL — ABNORMAL HIGH (ref 70–99)

## 2014-04-08 MED ORDER — THIAMINE HCL 100 MG PO TABS: 100.0000 mg | ORAL_TABLET | Freq: Every day | ORAL | Status: DC

## 2014-04-08 MED ORDER — FOLIC ACID 1 MG PO TABS: 1.0000 mg | ORAL_TABLET | Freq: Every day | ORAL | Status: DC

## 2014-04-08 MED ORDER — FUROSEMIDE 40 MG PO TABS: 40.0000 mg | ORAL_TABLET | Freq: Every day | ORAL | Status: DC

## 2014-04-08 MED ORDER — GLIMEPIRIDE 4 MG PO TABS
4.0000 mg | ORAL_TABLET | Freq: Every day | ORAL | Status: DC
  Administered 2014-04-08: 4 mg via ORAL
  Filled 2014-04-08 (×2): qty 1

## 2014-04-08 NOTE — Discharge Instructions (Signed)
Follow with Primary MD Marlou Sa, [name], MD and your cardiologist Dr. Terrence Dupont in 7 days   Get CBC, CMP, 2 view Chest X ray checked  by Primary MD next visit.    Activity: As tolerated with Full fall precautions use walker/cane & assistance as needed   Disposition Home     Diet: Heart Healthy Low Carb  - Check your Weight same time everyday, if you gain over 2 pounds, or you develop in leg swelling, experience more shortness of breath or chest pain, call your Primary MD immediately. Follow Cardiac Low Salt Diet and 1.5 lit/day fluid restriction.   On your next visit with your primary care physician please Get Medicines reviewed and adjusted.   Please request your Prim.MD to go over all Hospital Tests and Procedure/Radiological results at the follow up, please get all Hospital records sent to your Prim MD by signing hospital release before you go home.   If you experience worsening of your admission symptoms, develop shortness of breath, life threatening emergency, suicidal or homicidal thoughts you must seek medical attention immediately by calling 911 or calling your MD immediately  if symptoms less severe.  You Must read complete instructions/literature along with all the possible adverse reactions/side effects for all the Medicines you take and that have been prescribed to you. Take any new Medicines after you have completely understood and accpet all the possible adverse reactions/side effects.   Do not drive, operating heavy machinery, perform activities at heights, swimming or participation in water activities or provide baby sitting services if your were admitted for syncope or siezures until you have seen by Primary MD or a Neurologist and advised to do so again.  Do not drive when taking Pain medications.    Do not take more than prescribed Pain, Sleep and Anxiety Medications  Special Instructions: If you have smoked or chewed Tobacco  in the last 2 yrs please stop smoking, stop any  regular Alcohol  and or any Recreational drug use.  Wear Seat belts while driving.   Please note  You were cared for by a hospitalist during your hospital stay. If you have any questions about your discharge medications or the care you received while you were in the hospital after you are discharged, you can call the unit and asked to speak with the hospitalist on call if the hospitalist that took care of you is not available. Once you are discharged, your primary care physician will handle any further medical issues. Please note that NO REFILLS for any discharge medications will be authorized once you are discharged, as it is imperative that you return to your primary care physician (or establish a relationship with a primary care physician if you do not have one) for your aftercare needs so that they can reassess your need for medications and monitor your lab values.

## 2014-04-08 NOTE — Progress Notes (Signed)
1400 dischargei nstructions and prescriptions given to pt .Verbalized understanding . Escorted to lobby by NT

## 2014-04-08 NOTE — Progress Notes (Signed)
Patient sleeping peacefully. Maintained on telemetry and is in NSR on the monitor. Will continue to monitor.  Dia Donate, RN 

## 2014-04-08 NOTE — Care Management Note (Addendum)
  Page 1 of 1   04/08/2014     11:30:56 AM CARE MANAGEMENT NOTE 04/08/2014  Patient:  Austin Alexander, Austin Alexander   Account Number:  [id number]  Date Initiated:  04/08/2014  Documentation initiated by:  Mariann Laster  Subjective/Objective Assessment:   CHF     Action/Plan:   CM to follow for disposition needs   Anticipated DC Date:  04/08/2014   Anticipated DC Plan:  HOME/SELF CARE  In-house referral  Clinical Social Worker         Choice offered to / List presented to:             Status of service:  Completed, signed off Medicare Important Message given?   (If response is "NO", the following Medicare IM given date fields will be blank) Date Medicare IM given:   Medicare IM given by:   Date Additional Medicare IM given:   Additional Medicare IM given by:    Discharge Disposition:  HOME/SELF CARE  Per UR Regulation:  Reviewed for med. necessity/level of care/duration of stay  If discussed at [place] of Stay Meetings, dates discussed:    Comments:  Caysie Minnifield RN, BSN, MSHL, CCM  Nurse - Case Manager,  ([facility] [phone]   04/08/2014 Progression Rounds:  SW Referral to [facility]: substance abuse OP services.

## 2014-04-08 NOTE — Consult Note (Signed)
Referring Physician:  [name] is an 43 y.o. male.                       Chief Complaint: Shortness of breath  HPI: 43 y.o. male has shortness of breath without fever or chest pain or cough. He has h/o cardiomyopathy, EF about 30% in June of this year, HTN, DM, heavy alcohol and tobacco use presents with right greater than left leg edema, DOE, orthopnea. On spironolactone, ACE inhibitor, carvedilol, but not lasix. Has seen Dr. Terrence Alexander in the past. Admits to dietary indiscretion with salt. Also, continued heavy alcohol use. Denies illicit drugs. He is feeling better with diuresis and says he will give up alcohol and excess salt and fluid intake.  Past Medical History  Diagnosis Date  . Bronchitis   . Croup   . Tobacco abuse   . Dizziness   . Abscess of perineum   . Heartburn   . Indigestion   . H/O folliculitis   . 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 Alcohol abuse   . Hypertension     variable in nature  . Hyperlipidemia   . Multiple excoriations     multifactorial in origin  . Mild obesity   . Dietary noncompliance     History of,  . Situational stress     secondary to financial issues  . Erectile dysfunction     Multifactorial. Improved with Cialis. New onset.  . Cystic acne     with acne keloidosis  . Diabetes mellitus without complication     Type 2, uncontrolled with questionable improvement  . 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 EKG, abnormal     History of, with negative cardiac evaluation by history.  [name] COPD (chronic obstructive pulmonary disease)     moderate obstruction with low vital capacity  . Asthma     History of,  . CHF (congestive heart failure)       Past Surgical History  Procedure Laterality Date  . Incision and drainage perirectal abscess N/A 02/17/2014    Procedure: IRRIGATION AND DEBRIDEMENT PERIRECTAL  AND SCROTAL ABSCESS;  Surgeon: Coralie Keens, MD;  Location: [facility] OR;  Service: General;   Laterality: N/A;    Family History  Problem Relation Age of Onset  . Diabetes type II Mother   . Hypertension Mother    Social History:  reports that he has been smoking Cigarettes.  He has a 20 pack-year smoking history. He does not have any smokeless tobacco history on file. He reports that he drinks about 7.2 oz of alcohol per week. He reports that he does not use illicit drugs.  Allergies: No Known Allergies  Medications Prior to Admission  Medication Sig Dispense Refill  . aspirin EC 81 MG EC tablet Take 1 tablet (81 mg total) by mouth daily. 30 tablet 6  . carvedilol (COREG) 25 MG tablet Take 25 mg by mouth 2 (two) times daily with a meal.    . glimepiride (AMARYL) 4 MG tablet Take 4 mg by mouth daily.  0  . lisinopril (PRINIVIL,ZESTRIL) 20 MG tablet Take 20 mg by mouth daily.    . metFORMIN (GLUCOPHAGE) 1000 MG tablet Take 1,000 mg by mouth 2 (two) times daily with a meal.    . Multiple Vitamins-Minerals (CENTRUM ADULTS PO) Take 1 tablet by mouth daily.    . simvastatin (ZOCOR) 40 MG tablet Take 1 tablet (  40 mg total) by mouth daily. 30 tablet 0  . spironolactone (ALDACTONE) 25 MG tablet Take 1 tablet (25 mg total) by mouth daily. 30 tablet 0  . [DISCONTINUED] HYDROcodone-acetaminophen (NORCO/VICODIN) 5-325 MG per tablet Take 1 tablet by mouth every 4 (four) hours as needed for moderate pain. (Patient not taking: Reported on 04/06/2014) 20 tablet 0  . [DISCONTINUED] metFORMIN (GLUCOPHAGE) 500 MG tablet Take 2 tablets (1,000 mg total) by mouth 2 (two) times daily with a meal. (Patient not taking: Reported on 04/06/2014) 60 tablet 0  . [DISCONTINUED] saccharomyces boulardii (FLORASTOR) 250 MG capsule Take 1 capsule (250 mg total) by mouth 2 (two) times daily. (Patient not taking: Reported on 04/06/2014) 60 capsule 0    Results for orders placed or performed during the hospital encounter of 04/06/14 (from the past 48 hour(s))  CBC     Status: Abnormal   Collection Time: 04/06/14  12:09 PM  Result Value Ref Range   WBC 5.6 4.0 - 10.5 K/uL   RBC 3.93 (L) 4.22 - 5.81 MIL/uL   Hemoglobin 11.5 (L) 13.0 - 17.0 g/dL   HCT 33.3 (L) 39.0 - 52.0 %   MCV 84.7 78.0 - 100.0 fL   MCH 29.3 26.0 - 34.0 pg   MCHC 34.5 30.0 - 36.0 g/dL   RDW 15.7 (H) 11.5 - 15.5 %   Platelets 347 150 - 400 K/uL  Basic metabolic panel     Status: Abnormal   Collection Time: 04/06/14 12:09 PM  Result Value Ref Range   Sodium 127 (L) 135 - 145 mmol/L    Comment: Please note change in reference range.   Potassium 4.5 3.5 - 5.1 mmol/L    Comment: Please note change in reference range.   Chloride 96 96 - 112 mEq/L   CO2 22 19 - 32 mmol/L   Glucose, Bld 74 70 - 99 mg/dL   BUN 10 6 - 23 mg/dL   Creatinine, Ser 0.88 0.50 - 1.35 mg/dL   Calcium 8.8 8.4 - 10.5 mg/dL   GFR calc non Af Amer >90 >90 mL/min   GFR calc Af Amer >90 >90 mL/min    Comment: (NOTE) The eGFR has been calculated using the CKD EPI equation. This calculation has not been validated in all clinical situations. eGFR's persistently <90 mL/min signify possible Chronic Kidney Disease.    Anion gap 9 5 - 15  I-stat troponin, ED (not at [facility])     Status: None   Collection Time: 04/06/14 12:18 PM  Result Value Ref Range   Troponin i, poc 0.02 0.00 - 0.08 ng/mL   Comment 3            Comment: Due to the release kinetics of cTnI, a negative result within the first hours of the onset of symptoms does not rule out myocardial infarction with certainty. If myocardial infarction is still suspected, repeat the test at appropriate intervals.   BNP (order ONLY if patient complains of dyspnea/SOB AND you have documented it for THIS visit)     Status: Abnormal   Collection Time: 04/06/14 12:52 PM  Result Value Ref Range   B Natriuretic Peptide 1329.3 (H) 0.0 - 100.0 pg/mL    Comment: Please note change in reference range.  Glucose, capillary     Status: Abnormal   Collection Time: 04/06/14  4:54 PM  Result Value Ref Range    Glucose-Capillary 120 (H) 70 - 99 mg/dL   Comment 1 Documented in Chart    Comment  2 Notify RN   Glucose, capillary     Status: Abnormal   Collection Time: 04/06/14  9:10 PM  Result Value Ref Range   Glucose-Capillary 270 (H) 70 - 99 mg/dL  Basic metabolic panel     Status: Abnormal   Collection Time: 04/07/14  4:11 AM  Result Value Ref Range   Sodium 132 (L) 135 - 145 mmol/L    Comment: Please note change in reference range.   Potassium 4.3 3.5 - 5.1 mmol/L    Comment: Please note change in reference range.   Chloride 98 96 - 112 mEq/L   CO2 25 19 - 32 mmol/L   Glucose, Bld 181 (H) 70 - 99 mg/dL   BUN 20 6 - 23 mg/dL   Creatinine, Ser 1.12 0.50 - 1.35 mg/dL   Calcium 8.5 8.4 - 10.5 mg/dL   GFR calc non Af Amer 78 (L) >90 mL/min   GFR calc Af Amer >90 >90 mL/min    Comment: (NOTE) The eGFR has been calculated using the CKD EPI equation. This calculation has not been validated in all clinical situations. eGFR's persistently <90 mL/min signify possible Chronic Kidney Disease.    Anion gap 9 5 - 15  Glucose, capillary     Status: Abnormal   Collection Time: 04/07/14  6:27 AM  Result Value Ref Range   Glucose-Capillary 247 (H) 70 - 99 mg/dL  Glucose, capillary     Status: Abnormal   Collection Time: 04/07/14 11:40 AM  Result Value Ref Range   Glucose-Capillary 254 (H) 70 - 99 mg/dL   Comment 1 Documented in Chart    Comment 2 Notify RN   Glucose, capillary     Status: None   Collection Time: 04/07/14  5:24 PM  Result Value Ref Range   Glucose-Capillary 74 70 - 99 mg/dL   Comment 1 Documented in Chart    Comment 2 Notify RN   Glucose, capillary     Status: Abnormal   Collection Time: 04/07/14  6:34 PM  Result Value Ref Range   Glucose-Capillary 202 (H) 70 - 99 mg/dL   Comment 1 Documented in Chart    Comment 2 Notify RN   Glucose, capillary     Status: Abnormal   Collection Time: 04/07/14  9:12 PM  Result Value Ref Range   Glucose-Capillary 114 (H) 70 - 99 mg/dL   Basic metabolic panel     Status: Abnormal   Collection Time: 04/08/14  6:27 AM  Result Value Ref Range   Sodium 134 (L) 135 - 145 mmol/L    Comment: Please note change in reference range.   Potassium 4.4 3.5 - 5.1 mmol/L    Comment: Please note change in reference range.   Chloride 99 96 - 112 mEq/L   CO2 24 19 - 32 mmol/L   Glucose, Bld 165 (H) 70 - 99 mg/dL   BUN 20 6 - 23 mg/dL   Creatinine, Ser 1.13 0.50 - 1.35 mg/dL   Calcium 8.6 8.4 - 10.5 mg/dL   GFR calc non Af Amer 77 (L) >90 mL/min   GFR calc Af Amer 89 (L) >90 mL/min    Comment: (NOTE) The eGFR has been calculated using the CKD EPI equation. This calculation has not been validated in all clinical situations. eGFR's persistently <90 mL/min signify possible Chronic Kidney Disease.    Anion gap 11 5 - 15  Glucose, capillary     Status: Abnormal   Collection Time: 04/08/14  6:27 AM    Result Value Ref Range   Glucose-Capillary 198 (H) 70 - 99 mg/dL   Dg Chest 2 View  04/06/2014   CLINICAL DATA:  Shortness of breath at night  EXAM: CHEST  2 VIEW  COMPARISON:  09/22/2013  FINDINGS: Cardiac shadow is mildly enlarged. The lungs are well aerated bilaterally. Some fibrotic changes are noted in the bases bilaterally. No sizable effusion is seen. Very minimal vascular congestion is noted.  IMPRESSION: Minimal vascular congestion with chronic changes in the bases bilaterally.   Electronically Signed   By: Mark  Lukens M.D.   On: 04/06/2014 14:40    Review Of Systems HEENT: No Headache, blurred vision, runny nose, sore throat Neck: No Hypothyroidism, hyperthyroidism,,lymphadenopathy Chest : + Shortness of breath, history of COPD, Asthma Heart : Chest pain, history of coronary arterey disease GI: No Nausea, vomiting, diarrhea, constipation, GERD GU: No Dysuria, urgency, frequency of urination, hematuria Neuro: No Stroke, seizures, syncope Psych: No Depression, anxiety, hallucinations  Blood pressure 126/84, pulse 89,  temperature 97.9 F (36.6 C), temperature source Oral, resp. rate 18, height 6' (1.829 m), weight 103.148 kg (227 lb 6.4 oz), SpO2 97 %. Constitutional:Patient is a well-developed and well-nourished male* in no acute distress and cooperative with exam. Head: Normocephalic and atraumatic, Brown eyes, PERL, EOMI, Conj-pink, Sclera-non-icteric. Mucus membranes moist Neck: No JVD, Supple, No Thyromegaly. Cardiovascular: RRR, S1 normal, S2 normal. Pulmonary/Chest: CTAB, no wheezes, rales, or rhonchi Abdominal: Soft. Non-tender, non-distended, bowel sounds are normal, no masses, organomegaly, or guarding present.  Neurological: A&O x3, Strenght is normal and symmetric bilaterally, cranial nerve II-XII are grossly intact, no focal motor deficit, sensory intact to light touch bilaterally.  Extremities : No Cyanosis, or Clubbing. 1 + Edema left lower leg, trace right lower leg.  Assessment/Plan Acute on chronic left heart systolic failure Alcohol use disorder Tobacco use disorder Hypertension DM, II COPD  Agree with diuresis, ACE inhibitor, B-blocker, Simvastatin and Spironolactone and life style modification. May need right and left heart catheterization on out patient basis. May need ICD if LV function do not improve with medical treatment.  KADAKIA,AJAY S, MD  04/08/2014, 10:03 AM     

## 2014-04-08 NOTE — Progress Notes (Signed)
UR completed Katheryne Gorr K. Marquize Seib, RN, BSN, [place], CCM  04/08/2014 11:27 AM

## 2014-04-08 NOTE — Discharge Summary (Signed)
Austin Alexander, is a 43 y.o. male  DOB [birth year]  MRN [id number].  Admission date:  04/06/2014  Admitting Physician  Delfina Redwood, MD  Discharge Date:  04/08/2014   Primary MD  Kevan Ny, MD  Recommendations for primary care physician for things to follow:   Monitor weight, BMP and diuretic dose closely. Follow final echo report   Admission Diagnosis  Hyponatremia [E87.1] SOB (shortness of breath) [R06.02] Chronic hypertension [I10] Acute on chronic congestive heart failure, unspecified congestive heart failure type [I50.9]   Discharge Diagnosis  Hyponatremia [E87.1] SOB (shortness of breath) [R06.02] Chronic hypertension [I10] Acute on chronic congestive heart failure, unspecified congestive heart failure type [I50.9]    Principal Problem:   Acute on chronic systolic heart failure Active Problems:   HTN (hypertension)   Tobacco abuse   Alcohol abuse   DM type 2 (diabetes mellitus, type 2)      Past Medical History  Diagnosis Date  . Bronchitis   . Croup   . Tobacco abuse   . Dizziness   . Abscess of perineum   . Heartburn   . Indigestion   . H/O folliculitis   . 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 Alcohol abuse   . Hypertension     variable in nature  . Hyperlipidemia   . Multiple excoriations     multifactorial in origin  . Mild obesity   . Dietary noncompliance     History of,  . Situational stress     secondary to financial issues  . Erectile dysfunction     Multifactorial. Improved with Cialis. New onset.  . Cystic acne     with acne keloidosis  . Diabetes mellitus without complication     Type 2, uncontrolled with questionable improvement  . 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 EKG, abnormal     History of, with negative cardiac evaluation by  history.  [name] COPD (chronic obstructive pulmonary disease)     moderate obstruction with low vital capacity  . Asthma     History of,  . CHF (congestive heart failure)     Past Surgical History  Procedure Laterality Date  . Incision and drainage perirectal abscess N/A 02/17/2014    Procedure: IRRIGATION AND DEBRIDEMENT PERIRECTAL  AND SCROTAL ABSCESS;  Surgeon: Coralie Keens, MD;  Location: [facility];  Service: General;  Laterality: N/A;       History of present illness and  Hospital Course:     Kindly see H&P for history of present illness and admission details, please review complete Labs, Consult reports and Test reports for all details in brief  HPI  from the history and physical done on the day of admission   Austin Alexander is a 43 y.o. male with h/o cardiomyopathy, EF about 30% in June of this year, HTN, DM, heavy alcohol and tobacco use presents with right greater than left leg edema, DOE, orthopnea. On spironolactone, ACE inhibitor, carvedilol, but  not lasix. Has seen Dr. Terrence Dupont in the past. The plan was to repeat echo and if still low, refer for ICD. Patient has not had repeat echo. Admits to dietary indiscretion with salt. Also, continued heavy alcohol use. Denies illicit drugs. Denies CP, palpitations, cough.    Hospital Course   1. Acute on chronic systolic heart failure - EF 25%. Noncompliant with diet, continues to smoke and consume alcohol. Counseled, continue beta blocker, ACE along with Lasix . Has diuresed well. Now completely symptom free with no shortness of breath or oxygen demand,repeat echo pending Cards Dr Doylene Canard consulted, cleared for [place] Prelim TTE, will follow with Dr Terrence Dupont in 7 days, will be placed on Lasix upon discharge.    2. Tobacco abuse and alcohol abuse. Counseled to quit both. [place] on folic acid and thiamine. Social work requested to assist with enrolling into a AA program. No signs of DTs.    3.HTN - stable on beta blocker and Ace.    4.  DM type II. Is on Amaryl continue, A1c 6.8.    5. Dyslipidemia. On statin continue.      Discharge Condition: Stable   Follow UP  Follow-up Information    Follow up with DEAN, [name], MD. Schedule an appointment as soon as possible for a visit in 1 week.   Specialty:  Internal Medicine   Contact information:   [facility] Internal Medicine [place] [address] [phone]       Follow up with Clent Demark, MD. Schedule an appointment as soon as possible for a visit in 1 week.   Specialty:  Cardiology   Contact information:   [place] [address] [phone]         Discharge Instructions  and  Discharge Medications      Discharge Instructions    Discharge instructions    Complete by:  As directed   Follow with Primary MD Marlou Sa, [name], MD and your cardiologist Dr. Terrence Dupont in 7 days   Get CBC, CMP, 2 view Chest X ray checked  by Primary MD next visit.    Activity: As tolerated with Full fall precautions use walker/cane & assistance as needed   Disposition Home     Diet: Heart Healthy Low Carb  - Check your Weight same time everyday, if you gain over 2 pounds, or you develop in leg swelling, experience more shortness of breath or chest pain, call your Primary MD immediately. Follow Cardiac Low Salt Diet and 1.5 lit/day fluid restriction.   On your next visit with your primary care physician please Get Medicines reviewed and adjusted.   Please request your Prim.MD to go over all Hospital Tests and Procedure/Radiological results at the follow up, please get all Hospital records sent to your Prim MD by signing hospital release before you go home.   If you experience worsening of your admission symptoms, develop shortness of breath, life threatening emergency, suicidal or homicidal thoughts you must seek medical attention immediately by calling 911 or calling your MD immediately  if symptoms less  severe.  You Must read complete instructions/literature along with all the possible adverse reactions/side effects for all the Medicines you take and that have been prescribed to you. Take any new Medicines after you have completely understood and accpet all the possible adverse reactions/side effects.   Do not drive, operating heavy machinery, perform activities at heights, swimming or participation in water activities or provide baby sitting services  if your were admitted for syncope or siezures until you have seen by Primary MD or a Neurologist and advised to do so again.  Do not drive when taking Pain medications.    Do not take more than prescribed Pain, Sleep and Anxiety Medications  Special Instructions: If you have smoked or chewed Tobacco  in the last 2 yrs please stop smoking, stop any regular Alcohol  and or any Recreational drug use.  Wear Seat belts while driving.   Please note  You were cared for by a hospitalist during your hospital stay. If you have any questions about your discharge medications or the care you received while you were in the hospital after you are discharged, you can call the unit and asked to speak with the hospitalist on call if the hospitalist that took care of you is not available. Once you are discharged, your primary care physician will handle any further medical issues. Please note that NO REFILLS for any discharge medications will be authorized once you are discharged, as it is imperative that you return to your primary care physician (or establish a relationship with a primary care physician if you do not have one) for your aftercare needs so that they can reassess your need for medications and monitor your lab values.     Increase activity slowly    Complete by:  As directed             Medication List    TAKE these medications        aspirin 81 MG EC tablet  Take 1 tablet (81 mg total) by mouth daily.     carvedilol 25 MG tablet  Commonly  known as:  COREG  Take 25 mg by mouth 2 (two) times daily with a meal.     CENTRUM ADULTS PO  Take 1 tablet by mouth daily.     folic acid 1 MG tablet  Commonly known as:  FOLVITE  Take 1 tablet (1 mg total) by mouth daily.     furosemide 40 MG tablet  Commonly known as:  LASIX  Take 1 tablet (40 mg total) by mouth daily.     glimepiride 4 MG tablet  Commonly known as:  AMARYL  Take 4 mg by mouth daily.     lisinopril 20 MG tablet  Commonly known as:  PRINIVIL,ZESTRIL  Take 20 mg by mouth daily.     metFORMIN 1000 MG tablet  Commonly known as:  GLUCOPHAGE  Take 1,000 mg by mouth 2 (two) times daily with a meal.     simvastatin 40 MG tablet  Commonly known as:  ZOCOR  Take 1 tablet (40 mg total) by mouth daily.     spironolactone 25 MG tablet  Commonly known as:  ALDACTONE  Take 1 tablet (25 mg total) by mouth daily.     thiamine 100 MG tablet  Take 1 tablet (100 mg total) by mouth daily.          Diet and Activity recommendation: See Discharge Instructions above   Consults obtained -  Dr Doylene Canard (consulted)   Major procedures and Radiology Reports - PLEASE review detailed and final reports for all details, in brief -    TTE -  Pending - Ok for DC by Dr Doylene Canard (consulted)     Lower Extrimity Venous US  Summary:  - No evidence of deep vein thrombosis involving the left lower extremity. - No evidence of Baker&'s cyst on the left.  Dg Chest 2 View  04/06/2014   CLINICAL DATA:  Shortness of breath at night  EXAM: CHEST  2 VIEW  COMPARISON:  09/22/2013  FINDINGS: Cardiac shadow is mildly enlarged. The lungs are well aerated bilaterally. Some fibrotic changes are noted in the bases bilaterally. No sizable effusion is seen. Very minimal vascular congestion is noted.  IMPRESSION: Minimal vascular congestion with chronic changes in the bases bilaterally.   Electronically Signed   By: Inez Catalina M.D.   On: 04/06/2014 14:40    Micro Results       No results found for this or any previous visit (from the past 240 hour(s)).     Today   Subjective:   Austin Alexander today has no headache,no chest abdominal pain,no new weakness tingling or numbness, feels much better wants to go home today.    Objective:   Blood pressure 126/84, pulse 89, temperature 97.9 F (36.6 C), temperature source Oral, resp. rate 18, height 6' (1.829 m), weight 103.148 kg (227 lb 6.4 oz), SpO2 97 %.   Intake/Output Summary (Last 24 hours) at 04/08/14 0951 Last data filed at 04/08/14 [id number]  Gross per 24 hour  Intake   1320 ml  Output   3525 ml  Net  -2205 ml    Exam Awake Alert, Oriented x 3, No new F.N deficits, Normal affect [place].AT,PERRAL Supple Neck,No JVD, No cervical lymphadenopathy appriciated.  Symmetrical Chest wall movement, Good air movement bilaterally, CTAB RRR,No Gallops,Rubs or new Murmurs, No Parasternal Heave +ve B.Sounds, Abd Soft, Non tender, No organomegaly appriciated, No rebound -guarding or rigidity. No Cyanosis, Clubbing or edema, No new Rash or bruise  Data Review   CBC w Diff: Lab Results  Component Value Date   WBC 5.6 04/06/2014   HGB 11.5* 04/06/2014   HCT 33.3* 04/06/2014   PLT 347 04/06/2014   LYMPHOPCT 9* 02/15/2014   MONOPCT 5 02/15/2014   EOSPCT 3 02/15/2014   BASOPCT 0 02/15/2014    CMP: Lab Results  Component Value Date   NA 134* 04/08/2014   K 4.4 04/08/2014   CL 99 04/08/2014   CO2 24 04/08/2014   BUN 20 04/08/2014   CREATININE 1.13 04/08/2014   PROT 7.7 02/16/2014   ALBUMIN 2.1* 02/16/2014   BILITOT 0.5 02/16/2014   ALKPHOS 132* 02/16/2014   AST 9 02/16/2014   ALT 8 02/16/2014  .   Total Time in preparing paper work, data evaluation and todays exam - 35 minutes  Thurnell Lose M.D on 04/08/2014 at 9:51 AM  [facility] Office  [phone]

## 2014-04-11 ENCOUNTER — Emergency Department (HOSPITAL_COMMUNITY)
Admission: EM | Admit: 2014-04-11 | Discharge: 2014-04-11 | Disposition: A | Discharge status: Home / Self Care | Payer: 59 | Attending: Emergency Medicine | Admitting: Emergency Medicine

## 2014-04-11 ENCOUNTER — Encounter (HOSPITAL_COMMUNITY): Payer: Self-pay | Admitting: Emergency Medicine

## 2014-04-11 ENCOUNTER — Emergency Department (HOSPITAL_COMMUNITY)
Admission: EM | Admit: 2014-04-11 | Discharge: 2014-04-11 | Disposition: A | Discharge status: Nursing Home (Includes ICF) | Payer: 59 | Source: Home / Self Care | Attending: Emergency Medicine | Admitting: Emergency Medicine

## 2014-04-11 DIAGNOSIS — Z720 Tobacco use: Secondary | ICD-10-CM | POA: Insufficient documentation

## 2014-04-11 DIAGNOSIS — Z7982 Long term (current) use of aspirin: Secondary | ICD-10-CM | POA: Insufficient documentation

## 2014-04-11 DIAGNOSIS — S0083XA Contusion of other part of head, initial encounter: Secondary | ICD-10-CM

## 2014-04-11 DIAGNOSIS — Y9389 Activity, other specified: Secondary | ICD-10-CM | POA: Insufficient documentation

## 2014-04-11 DIAGNOSIS — Z87438 Personal history of other diseases of male genital organs: Secondary | ICD-10-CM | POA: Diagnosis not present

## 2014-04-11 DIAGNOSIS — W19XXXA Unspecified fall, initial encounter: Secondary | ICD-10-CM

## 2014-04-11 DIAGNOSIS — S0993XA Unspecified injury of face, initial encounter: Secondary | ICD-10-CM | POA: Diagnosis present

## 2014-04-11 DIAGNOSIS — I509 Heart failure, unspecified: Secondary | ICD-10-CM | POA: Diagnosis not present

## 2014-04-11 DIAGNOSIS — Z872 Personal history of diseases of the skin and subcutaneous tissue: Secondary | ICD-10-CM | POA: Insufficient documentation

## 2014-04-11 DIAGNOSIS — Y92009 Unspecified place in unspecified non-institutional (private) residence as the place of occurrence of the external cause: Principal | ICD-10-CM

## 2014-04-11 DIAGNOSIS — E785 Hyperlipidemia, unspecified: Secondary | ICD-10-CM | POA: Insufficient documentation

## 2014-04-11 DIAGNOSIS — Y92003 Bedroom of unspecified non-institutional (private) residence as the place of occurrence of the external cause: Secondary | ICD-10-CM | POA: Insufficient documentation

## 2014-04-11 DIAGNOSIS — I10 Essential (primary) hypertension: Secondary | ICD-10-CM | POA: Insufficient documentation

## 2014-04-11 DIAGNOSIS — Y92099 Unspecified place in other non-institutional residence as the place of occurrence of the external cause: Secondary | ICD-10-CM | POA: Diagnosis not present

## 2014-04-11 DIAGNOSIS — Z79899 Other long term (current) drug therapy: Secondary | ICD-10-CM | POA: Insufficient documentation

## 2014-04-11 DIAGNOSIS — Y998 Other external cause status: Secondary | ICD-10-CM | POA: Diagnosis not present

## 2014-04-11 DIAGNOSIS — W06XXXA Fall from bed, initial encounter: Secondary | ICD-10-CM | POA: Insufficient documentation

## 2014-04-11 DIAGNOSIS — J449 Chronic obstructive pulmonary disease, unspecified: Secondary | ICD-10-CM | POA: Insufficient documentation

## 2014-04-11 DIAGNOSIS — E119 Type 2 diabetes mellitus without complications: Secondary | ICD-10-CM | POA: Insufficient documentation

## 2014-04-11 DIAGNOSIS — S0011XA Contusion of right eyelid and periocular area, initial encounter: Secondary | ICD-10-CM | POA: Insufficient documentation

## 2014-04-11 LAB — GLUCOSE, CAPILLARY: Glucose-Capillary: 149 mg/dL — ABNORMAL HIGH (ref 70–99)

## 2014-04-11 NOTE — ED Notes (Signed)
Fell last night; rt. Facial swelling. States, "no pain." no problems with swallowing.

## 2014-04-11 NOTE — ED Notes (Signed)
Reports rolling out of bed and hitting right side of face on the floor.  C/o right sided facial swelling.

## 2014-04-11 NOTE — Discharge Instructions (Signed)
Contusion °A contusion is a deep bruise. Contusions are the result of an injury that caused bleeding under the skin. The contusion may turn blue, purple, or yellow. Minor injuries will give you a painless contusion, but more severe contusions may stay painful and swollen for a few weeks.  °CAUSES  °A contusion is usually caused by a blow, trauma, or direct force to an area of the body. °SYMPTOMS  °· Swelling and redness of the injured area. °· Bruising of the injured area. °· Tenderness and soreness of the injured area. °· Pain. °DIAGNOSIS  °The diagnosis can be made by taking a history and physical exam. An X-ray, CT scan, or MRI may be needed to determine if there were any associated injuries, such as fractures. °TREATMENT  °Specific treatment will depend on what area of the body was injured. In general, the best treatment for a contusion is resting, icing, elevating, and applying cold compresses to the injured area. Over-the-counter medicines may also be recommended for pain control. Ask your caregiver what the best treatment is for your contusion. °HOME CARE INSTRUCTIONS  °· Put ice on the injured area. °¨ Put ice in a plastic bag. °¨ Place a towel between your skin and the bag. °¨ Leave the ice on for 15-20 minutes, 3-4 times a day, or as directed by your health care provider. °· Only take over-the-counter or prescription medicines for pain, discomfort, or fever as directed by your caregiver. Your caregiver may recommend avoiding anti-inflammatory medicines (aspirin, ibuprofen, and naproxen) for 48 hours because these medicines may increase bruising. °· Rest the injured area. °· If possible, elevate the injured area to reduce swelling. °SEEK IMMEDIATE MEDICAL CARE IF:  °· You have increased bruising or swelling. °· You have pain that is getting worse. °· Your swelling or pain is not relieved with medicines. °MAKE SURE YOU:  °· Understand these instructions. °· Will watch your condition. °· Will get help right  away if you are not doing well or get worse. °Document Released: 01/06/2005 Document Revised: 04/03/2013 Document Reviewed: 02/01/2011 °[employer]® Patient Information ©2015 [employer]. This information is not intended to replace advice given to you by your health care provider. Make sure you discuss any questions you have with your health care provider. ° °

## 2014-04-11 NOTE — ED Provider Notes (Signed)
CSN: [id number]     Arrival date & time 04/11/14  1100 History   First MD Initiated Contact with Patient 04/11/14 1114     Chief Complaint  Patient presents with  . Facial Swelling     (Consider location/radiation/quality/duration/timing/severity/associated sxs/prior Treatment) The history is provided by the patient.   patient reports falling out of his bed last night and landing on the right side of his face.  He denies neck pain.  No weakness of his upper lower extremities.  He states he was having a nightmare just prior to this episode.  He denies biting his tongue.  No prior history of seizures.  He reports he awoke this morning and had mild swelling of the right side of his face and presented to the urgent care for evaluation.  Urgent care was concerned about the possibility of a facial fracture as well as why he fell out of the bed.  Patient denies palpitations.  No chest pain or shortness of breath.  He reports no dental pain.  No fevers or chills.  He denies recent injury besides falling out of bed.  He denies trismus or malocclusion.  No change in his vision.  No pain with extraocular movement.  Only very mild tenderness to palpation of his right lateral orbital rim.  Past Medical History  Diagnosis Date  . Bronchitis   . Croup   . Tobacco abuse   . Dizziness   . Abscess of perineum   . Heartburn   . Indigestion   . H/O folliculitis   . 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 Alcohol abuse   . Hypertension     variable in nature  . Hyperlipidemia   . Multiple excoriations     multifactorial in origin  . Mild obesity   . Dietary noncompliance     History of,  . Situational stress     secondary to financial issues  . Erectile dysfunction     Multifactorial. Improved with Cialis. New onset.  . Cystic acne     with acne keloidosis  . Diabetes mellitus without complication     Type 2, uncontrolled with questionable improvement  . Abscess     Multiple facial  abscesses, which has progressed  . EKG, abnormal     History of, with negative cardiac evaluation by history.  [name] COPD (chronic obstructive pulmonary disease)     moderate obstruction with low vital capacity  . Asthma     History of,  . CHF (congestive heart failure)    Past Surgical History  Procedure Laterality Date  . Incision and drainage perirectal abscess N/A 02/17/2014    Procedure: IRRIGATION AND DEBRIDEMENT PERIRECTAL  AND SCROTAL ABSCESS;  Surgeon: Coralie Keens, MD;  Location: [facility] OR;  Service: General;  Laterality: N/A;   Family History  Problem Relation Age of Onset  . Diabetes type II Mother   . Hypertension Mother    History  Substance Use Topics  . Smoking status: Current Every Day Smoker -- 1.00 packs/day for 20 years    Types: Cigarettes  . Smokeless tobacco: Not on file  . Alcohol Use: 7.2 oz/week    10 Cans of beer, 2 Shots of liquor per week     Comment: As of 04/06/14 - states occasional use    Review of Systems  All other systems reviewed and are negative.     Allergies  Review of patient's allergies indicates no known allergies.  Home  Medications   Prior to Admission medications   Medication Sig Start Date End Date Taking? Authorizing Provider  aspirin EC 81 MG EC tablet Take 1 tablet (81 mg total) by mouth daily. 09/23/13   Ejiroghene Arlyce Dice, MD  carvedilol (COREG) 25 MG tablet Take 25 mg by mouth 2 (two) times daily with a meal.    Historical Provider, MD  folic acid (FOLVITE) 1 MG tablet Take 1 tablet (1 mg total) by mouth daily. 04/08/14   Thurnell Lose, MD  furosemide (LASIX) 40 MG tablet Take 1 tablet (40 mg total) by mouth daily. 04/08/14   Thurnell Lose, MD  glimepiride (AMARYL) 4 MG tablet Take 4 mg by mouth daily. 03/18/14   Historical Provider, MD  lisinopril (PRINIVIL,ZESTRIL) 20 MG tablet Take 20 mg by mouth daily.    Historical Provider, MD  metFORMIN (GLUCOPHAGE) 1000 MG tablet Take 1,000 mg by mouth 2 (two) times daily  with a meal.    Historical Provider, MD  Multiple Vitamins-Minerals (CENTRUM ADULTS PO) Take 1 tablet by mouth daily.    Historical Provider, MD  simvastatin (ZOCOR) 40 MG tablet Take 1 tablet (40 mg total) by mouth daily. 09/23/13   Ejiroghene Arlyce Dice, MD  spironolactone (ALDACTONE) 25 MG tablet Take 1 tablet (25 mg total) by mouth daily. 09/23/13   Ejiroghene Arlyce Dice, MD  thiamine 100 MG tablet Take 1 tablet (100 mg total) by mouth daily. 04/08/14   Thurnell Lose, MD   BP 130/95 mmHg  Pulse 78  Temp(Src) 98.3 F (36.8 C) (Oral)  Resp 14  SpO2 100% Physical Exam  Constitutional: He is oriented to person, place, and time. He appears well-developed and well-nourished.  HENT:  Head: Normocephalic.  Mild swelling mainly located around his right lateral orbital rim.  Next recommended some intact.  No trismus or malocclusion.  No focal dental pain.  No tenderness over the right mandible.  No tenderness over the zygomatic arch on the right.  No tenderness over the right maxillary sinus.  Right orbital rim is palpable without obvious step-offs.  No bruising.  No cellulitis.  No associated hematoma.  No bruising noted  Eyes: EOM are normal.  Neck: Normal range of motion.  C-spine nontender.  C-spine cleared by Nexus criteria.  Pulmonary/Chest: Effort normal.  Abdominal: He exhibits no distension.  Musculoskeletal: Normal range of motion.  5 out of 5 strength of bilateral upper lower extremity major muscle groups.  Neurological: He is alert and oriented to person, place, and time.  Psychiatric: He has a normal mood and affect.  Nursing note and vitals reviewed.   ED Course  Procedures (including critical care time) Labs Review Labs Reviewed - No data to display  Imaging Review No results found.   EKG Interpretation None      MDM   Final diagnoses:  Facial contusion, initial encounter    Likely contusion of the right side of his face.  If he does have a fractured this would  be a very occult fracture that likely would not require surgical intervention.  Extraocular movements are intact.  Vision is normal.  No trismus or malocclusion.  Doubt mandible fracture.  Discharge home with primary care follow-up.  He understands to return to the ER for new or worsening symptoms.    Hoy Morn, MD 04/11/14 [phone]

## 2014-04-11 NOTE — ED Provider Notes (Signed)
Chief Complaint   Fall   History of Present Illness   Austin Alexander is a 43 year old male who fell out of his bed last night around 8 PM. The patient was asleep at the time. He estimates his bed is 3-1/2 feet off the floor. He fell on a carpeted floor. He's not sure whether he lost consciousness or not. He denies any seizure activity. Denies any recent use of alcohol. He's not sure why he fell out of bed. He landed on his face. Right side of his face is swollen and painful. Denies any headache. He had nausea and vomiting once last night. None since then. No diplopia or blurry vision. No bleeding from the nose or the ears. He denies any painful or stiff neck. Denies any injury to the trunk or extremities. He's had no difficulty with speech or swallowing. No trouble with ambulation. No numbness, tingling, or weakness.  Review of Systems   Other than as noted above, the patient denies any of the following symptoms: Eye:  No eye pain, diplopia or blurred vision ENT:  No bleeding from nose or ears.  No loose or broken teeth. Neck:  No pain or limited ROM. GI:  No nausea or vomiting. Neuro:  No loss of consciousness, seizure activity, numbness, tingling, or weakness.  [place]   Past medical history, family history, social history, meds, and allergies were reviewed. He has no known medication allergies. He has a history of high blood pressure, diabetes, hypercholesterolemia, gastroesophageal reflux, alcohol abuse, and congestive heart failure. Current meds include aspirin, carvedilol, glimepiride, lisinopril, metformin, simvastatin, spironolactone, thiamine, folic acid, and furosemide.  Physical Examination   Vital signs:  BP 124/81 mmHg  Pulse 81  Temp(Src) 98.4 F (36.9 C) (Oral)  Resp 18  SpO2 98% General:  Alert and oriented times 3.  In no distress. Eye:  PERRL, full EOMs.  Lids and conjunctivas normal. Fundi benign. HEENT:  There is swelling and tenderness to palpation in the right  periorbital area and over the right maxilla,  TMs and canals normal, nasal mucosa normal.  No oral lacerations.  Teeth were intact without obvious oral trauma. Neck:  Non tender.  Full ROM without pain. Neurological:  Alert and oriented.  Cranial nerves intact.  No pronator drift. Finger to nose test was normal.  No muscle weakness. DTRs were symmetrical.  Sensation was intact to light touch. Gait was normal.  Romberg's sign negative.  Able to perform tandem gait well.  Assessment   The primary encounter diagnosis was Fall at home, initial encounter. A diagnosis of Facial contusion, initial encounter was also pertinent to this visit.  I'm not sure why he fell out of the bed, and I'm also concerned that there may have been loss of consciousness and also he had some nausea and vomiting last night. He may have a facial bone fracture. I think he needs CT scans of his face and his head.  Plan   The patient was transferred to the ED via shuttle in stable condition.  Medical Decision Making:  43 year old male with DM, HT, and CHF states he fell out of bed last night at 8 p.m.  No seizure activity and he denies any use of alcohol.  Unknown as to why he fell out of bed.  Is not sure whether he lost consciousness or not.  Today he has right facial pain and swelling.  Denies any headache, or nausea, but states he did vomit last night.  No diplopia  or blurry vision.  Neck not stiff.  No bleeding from nose or ears.  No paresthesias or weakness.  No other neuro symptoms.  On exam he is tender over maxilla and around orbit.  Has swelling over maxilla and orbit.  Neuro exam otherwise normal.  No other apparent injuries.  I am concerned as to why he fell out of bed and whether there could have been a loss of consciousness.  Also I think he needs a facial CT to rule out facial bone fracture.      Harden Mo, MD 04/11/14 [phone]

## 2014-04-11 NOTE — Discharge Instructions (Signed)
We have determined that your problem requires further evaluation in the emergency department.  We will take care of your transport there.  Once at the emergency department, you will be evaluated by a provider and they will order whatever treatment or tests they deem necessary.  We cannot guarantee that they will do any specific test or do any specific treatment.  ° °

## 2014-06-06 ENCOUNTER — Encounter (HOSPITAL_COMMUNITY): Payer: Self-pay | Admitting: Emergency Medicine

## 2014-06-06 ENCOUNTER — Emergency Department (INDEPENDENT_AMBULATORY_CARE_PROVIDER_SITE_OTHER): Payer: 59

## 2014-06-06 ENCOUNTER — Emergency Department (HOSPITAL_COMMUNITY): Payer: 59

## 2014-06-06 ENCOUNTER — Emergency Department (HOSPITAL_COMMUNITY)
Admission: EM | Admit: 2014-06-06 | Discharge: 2014-06-06 | Disposition: A | Discharge status: Home / Self Care | Payer: 59 | Source: Home / Self Care | Attending: Family Medicine | Admitting: Family Medicine

## 2014-06-06 DIAGNOSIS — L02519 Cutaneous abscess of unspecified hand: Secondary | ICD-10-CM

## 2014-06-06 MED ORDER — HYDROCODONE-ACETAMINOPHEN 5-325 MG PO TABS: 1.0000 | ORAL_TABLET | Freq: Four times a day (QID) | ORAL | Status: DC | PRN

## 2014-06-06 MED ORDER — LIDOCAINE-EPINEPHRINE (PF) 2 %-1:200000 IJ SOLN
INTRAMUSCULAR | Status: AC
  Filled 2014-06-06: qty 20

## 2014-06-06 MED ORDER — CLINDAMYCIN HCL 300 MG PO CAPS: 300.0000 mg | ORAL_CAPSULE | Freq: Three times a day (TID) | ORAL | Status: DC

## 2014-06-06 NOTE — ED Provider Notes (Signed)
CSN: [id number]     Arrival date & time 06/06/14  1404 History   First MD Initiated Contact with Patient 06/06/14 1458     Chief Complaint  Patient presents with  . Hand Problem   (Consider location/radiation/quality/duration/timing/severity/associated sxs/prior Treatment) HPI   Patient here with complaints of abscess on left hand. He states that about 2-3 months ago he had a splinter in that hand and he got all of the splinter out he thought. He said that the lesion in his hand healed up on the skin but that he had a small lump there persistently. Last night he got rapidly larger and began to hurt. He denies fever, chills, dyspnea, chest pain, and change in oral intake.  Past Medical History  Diagnosis Date  . Bronchitis   . Croup   . Tobacco abuse   . Dizziness   . Abscess of perineum   . Heartburn   . Indigestion   . H/O folliculitis   . 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 Alcohol abuse   . Hypertension     variable in nature  . Hyperlipidemia   . Multiple excoriations     multifactorial in origin  . Mild obesity   . Dietary noncompliance     History of,  . Situational stress     secondary to financial issues  . Erectile dysfunction     Multifactorial. Improved with Cialis. New onset.  . Cystic acne     with acne keloidosis  . Diabetes mellitus without complication     Type 2, uncontrolled with questionable improvement  . 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 EKG, abnormal     History of, with negative cardiac evaluation by history.  [name] COPD (chronic obstructive pulmonary disease)     moderate obstruction with low vital capacity  . Asthma     History of,  . CHF (congestive heart failure)    Past Surgical History  Procedure Laterality Date  . Incision and drainage perirectal abscess N/A 02/17/2014    Procedure: IRRIGATION AND DEBRIDEMENT PERIRECTAL  AND SCROTAL ABSCESS;  Surgeon: Coralie Keens, MD;  Location: [facility] OR;   Service: General;  Laterality: N/A;   Family History  Problem Relation Age of Onset  . Diabetes type II Mother   . Hypertension Mother    History  Substance Use Topics  . Smoking status: Current Every Day Smoker -- 1.00 packs/day for 20 years    Types: Cigarettes  . Smokeless tobacco: Not on file  . Alcohol Use: 7.2 oz/week    10 Cans of beer, 2 Shots of liquor per week     Comment: As of 04/06/14 - states occasional use    Review of Systems   Per HPI  Allergies  Review of patient's allergies indicates no known allergies.  Home Medications   Prior to Admission medications   Medication Sig Start Date End Date Taking? Authorizing Provider  aspirin EC 81 MG EC tablet Take 1 tablet (81 mg total) by mouth daily. 09/23/13   Ejiroghene Arlyce Dice, MD  carvedilol (COREG) 25 MG tablet Take 25 mg by mouth 2 (two) times daily with a meal.    Historical Provider, MD  clindamycin (CLEOCIN) 300 MG capsule Take 1 capsule (300 mg total) by mouth 3 (three) times daily. 06/06/14   Timmothy Euler, MD  folic acid (FOLVITE) 1 MG tablet Take 1 tablet (1 mg total) by mouth daily.  04/08/14   Thurnell Lose, MD  furosemide (LASIX) 40 MG tablet Take 1 tablet (40 mg total) by mouth daily. 04/08/14   Thurnell Lose, MD  glimepiride (AMARYL) 4 MG tablet Take 4 mg by mouth daily. 03/18/14   Historical Provider, MD  HYDROcodone-acetaminophen (NORCO) 5-325 MG per tablet Take 1 tablet by mouth every 6 (six) hours as needed for moderate pain. 06/06/14   Timmothy Euler, MD  lisinopril (PRINIVIL,ZESTRIL) 20 MG tablet Take 20 mg by mouth daily.    Historical Provider, MD  metFORMIN (GLUCOPHAGE) 1000 MG tablet Take 1,000 mg by mouth 2 (two) times daily with a meal.    Historical Provider, MD  Multiple Vitamins-Minerals (CENTRUM ADULTS PO) Take 1 tablet by mouth daily.    Historical Provider, MD  simvastatin (ZOCOR) 40 MG tablet Take 1 tablet (40 mg total) by mouth daily. 09/23/13   Ejiroghene Arlyce Dice, MD   spironolactone (ALDACTONE) 25 MG tablet Take 1 tablet (25 mg total) by mouth daily. 09/23/13   Ejiroghene Arlyce Dice, MD  thiamine 100 MG tablet Take 1 tablet (100 mg total) by mouth daily. 04/08/14   Thurnell Lose, MD   BP 140/104 mmHg  Pulse 124  Temp(Src) 99.1 F (37.3 C) (Oral)  Resp 18  SpO2 96% Physical Exam  Constitutional: He appears well-developed and well-nourished. No distress.  HENT:  Head: Normocephalic and atraumatic.  Eyes: Conjunctivae are normal. Right eye exhibits no discharge. Left eye exhibits no discharge.  Neck: Neck supple.  Cardiovascular: Normal rate, regular rhythm and normal heart sounds.   No murmur heard. Pulmonary/Chest: Effort normal and breath sounds normal. No respiratory distress. He has no wheezes.  Musculoskeletal:  Left hand with large fluctuant mass over the thenar eminence, mass measures approximately 4 cm x 4 cm an approximately 2-3 cm tall. Diffuse swelling around the thumb. Strength and sensation intact in all the fingers of the left hand.  Nursing note and vitals reviewed.   ED Course  INCISION AND DRAINAGE Date/Time: 06/06/2014 4:11 PM Performed by: Timmothy Euler Authorized by: Lynne Leader, S Consent: Verbal consent obtained. Risks and benefits: risks, benefits and alternatives were discussed Consent given by: patient Type: abscess Body area: upper extremity Location details: left hand Anesthesia: local infiltration Local anesthetic: lidocaine 2% with epinephrine Patient sedated: no Scalpel size: 11 Incision type: single straight Complexity: simple Drainage: purulent Drainage amount: copious Wound treatment: wound left open Packing material: none Patient tolerance: Patient tolerated the procedure well with no immediate complications   (including critical care time) Labs Review Labs Reviewed  CULTURE, ROUTINE-ABSCESS    Imaging Review Dg Hand Complete Left  06/06/2014   CLINICAL DATA:  Splinter wound in the  thenar are region of the left hand 5 months ago, the far body with fall to be removed, last night hand contain painful with swelling in this region.  EXAM: LEFT HAND - COMPLETE 3+ VIEW  COMPARISON:  None.  FINDINGS: The bones of the left hand are adequately mineralized. There is no lytic or blastic lesion or periosteal reaction. The joint spaces are preserved. There is soft tissue swelling diffusely in the thenar region. No soft tissue gas collections are demonstrated. No radiopaque foreign body is seen.  IMPRESSION: There is soft tissue swelling in the thenar region consistent with clinically suspected cellulitis. No foreign body is demonstrated. There are no findings to suggest osteomyelitis.   Electronically Signed   By: David  [place]   On: 06/06/2014 16:11  MDM   1. Hand abscess    44 year old male here with left hand abscess after a wooden splinter 2-3 months ago. Abscess develop rapidly overnight. X-ray was obtained and does not show any involvement of the bone. I&D was performed and tolerated well by the patient. He has been started on clindamycin and follow up with his PCP has been arranged for tomorrow. Red flags for return were reviewed in detail.   Pt seen and discussed with Dr. Saralyn Pilar and he agrees with the plan as discussed above.     Timmothy Euler, MD 06/06/14 [phone]

## 2014-06-06 NOTE — ED Notes (Addendum)
Splinter in left palm months ago.  Thought all of splinter removed.  Last night noted pain and swelling to left palm of hand.  Today the area is red and large and painful

## 2014-06-06 NOTE — Discharge Instructions (Signed)
You had a substantial infection in your left hand that was drained in our clinic today. It is important for you to keep your follow-up appointment at Dr. Randel Pigg office tomorrow (06/06/14) at 2:30. Please start your antibiotics. If this continues to get worse you will have to go to the hospital. There is no evidence of a retained splinter in your hand.

## 2014-06-10 LAB — CULTURE, ROUTINE-ABSCESS

## 2014-06-27 ENCOUNTER — Encounter (HOSPITAL_COMMUNITY): Payer: Self-pay | Admitting: Emergency Medicine

## 2014-06-27 ENCOUNTER — Inpatient Hospital Stay (HOSPITAL_COMMUNITY): Payer: 59

## 2014-06-27 ENCOUNTER — Other Ambulatory Visit (HOSPITAL_COMMUNITY): Payer: Self-pay

## 2014-06-27 ENCOUNTER — Inpatient Hospital Stay (HOSPITAL_COMMUNITY)
Admission: EM | Admit: 2014-06-27 | Discharge: 2014-06-30 | DRG: 603 | Disposition: A | Discharge status: Home / Self Care | Payer: 59 | Attending: Internal Medicine | Admitting: Internal Medicine

## 2014-06-27 DIAGNOSIS — R739 Hyperglycemia, unspecified: Secondary | ICD-10-CM

## 2014-06-27 DIAGNOSIS — E785 Hyperlipidemia, unspecified: Secondary | ICD-10-CM | POA: Diagnosis present

## 2014-06-27 DIAGNOSIS — Z7982 Long term (current) use of aspirin: Secondary | ICD-10-CM

## 2014-06-27 DIAGNOSIS — F1010 Alcohol abuse, uncomplicated: Secondary | ICD-10-CM | POA: Diagnosis present

## 2014-06-27 DIAGNOSIS — F17210 Nicotine dependence, cigarettes, uncomplicated: Secondary | ICD-10-CM | POA: Diagnosis present

## 2014-06-27 DIAGNOSIS — I5022 Chronic systolic (congestive) heart failure: Secondary | ICD-10-CM | POA: Diagnosis present

## 2014-06-27 DIAGNOSIS — I10 Essential (primary) hypertension: Secondary | ICD-10-CM | POA: Diagnosis present

## 2014-06-27 DIAGNOSIS — J449 Chronic obstructive pulmonary disease, unspecified: Secondary | ICD-10-CM | POA: Diagnosis present

## 2014-06-27 DIAGNOSIS — E871 Hypo-osmolality and hyponatremia: Secondary | ICD-10-CM

## 2014-06-27 DIAGNOSIS — L02512 Cutaneous abscess of left hand: Secondary | ICD-10-CM | POA: Diagnosis present

## 2014-06-27 DIAGNOSIS — R000 Tachycardia, unspecified: Secondary | ICD-10-CM

## 2014-06-27 DIAGNOSIS — M7989 Other specified soft tissue disorders: Secondary | ICD-10-CM | POA: Diagnosis present

## 2014-06-27 DIAGNOSIS — E119 Type 2 diabetes mellitus without complications: Secondary | ICD-10-CM

## 2014-06-27 DIAGNOSIS — E1165 Type 2 diabetes mellitus with hyperglycemia: Secondary | ICD-10-CM | POA: Diagnosis present

## 2014-06-27 DIAGNOSIS — E118 Type 2 diabetes mellitus with unspecified complications: Secondary | ICD-10-CM

## 2014-06-27 DIAGNOSIS — J45909 Unspecified asthma, uncomplicated: Secondary | ICD-10-CM | POA: Diagnosis present

## 2014-06-27 DIAGNOSIS — Z79899 Other long term (current) drug therapy: Secondary | ICD-10-CM

## 2014-06-27 DIAGNOSIS — L02519 Cutaneous abscess of unspecified hand: Secondary | ICD-10-CM

## 2014-06-27 LAB — BASIC METABOLIC PANEL
Anion gap: 7 (ref 5–15)
BUN: 31 mg/dL — ABNORMAL HIGH (ref 6–23)
CHLORIDE: 86 mmol/L — AB (ref 96–112)
CO2: 30 mmol/L (ref 19–32)
CREATININE: 1.67 mg/dL — AB (ref 0.50–1.35)
Calcium: 9 mg/dL (ref 8.4–10.5)
GFR calc non Af Amer: 48 mL/min — ABNORMAL LOW (ref >90)
GFR, EST AFRICAN AMERICAN: 56 mL/min — AB (ref >90)
Glucose, Bld: 506 mg/dL — ABNORMAL HIGH (ref 70–99)
Potassium: 4.6 mmol/L (ref 3.5–5.1)
Sodium: 123 mmol/L — ABNORMAL LOW (ref 135–145)

## 2014-06-27 LAB — CBC WITH DIFFERENTIAL/PLATELET
BASOS ABS: 0.1 10*3/uL (ref 0.0–0.1)
Basophils Relative: 1 % (ref 0–1)
EOS ABS: 0.7 10*3/uL (ref 0.0–0.7)
EOS PCT: 6 % — AB (ref 0–5)
HCT: 37.1 % — ABNORMAL LOW (ref 39.0–52.0)
Hemoglobin: 12.7 g/dL — ABNORMAL LOW (ref 13.0–17.0)
LYMPHS PCT: 14 % (ref 12–46)
Lymphs Abs: 1.4 10*3/uL (ref 0.7–4.0)
MCH: 27.7 pg (ref 26.0–34.0)
MCHC: 34.2 g/dL (ref 30.0–36.0)
MCV: 80.8 fL (ref 78.0–100.0)
MONO ABS: 0.6 10*3/uL (ref 0.1–1.0)
MONOS PCT: 6 % (ref 3–12)
NEUTROS ABS: 7.5 10*3/uL (ref 1.7–7.7)
NEUTROS PCT: 73 % (ref 43–77)
Platelets: 361 10*3/uL (ref 150–400)
RBC: 4.59 MIL/uL (ref 4.22–5.81)
RDW: 16.5 % — ABNORMAL HIGH (ref 11.5–15.5)
WBC: 10.3 10*3/uL (ref 4.0–10.5)

## 2014-06-27 LAB — I-STAT ARTERIAL BLOOD GAS, ED
Acid-Base Excess: 1 mmol/L (ref 0.0–2.0)
BICARBONATE: 25.2 meq/L — AB (ref 20.0–24.0)
O2 SAT: 93 %
PH ART: 7.415 (ref 7.350–7.450)
Patient temperature: 98.6
TCO2: 26 mmol/L (ref 0–100)
pCO2 arterial: 39.3 mmHg (ref 35.0–45.0)
pO2, Arterial: 64 mmHg — ABNORMAL LOW (ref 80.0–100.0)

## 2014-06-27 LAB — ETHANOL: Alcohol, Ethyl (B): <5 mg/dL (ref 0–9)

## 2014-06-27 LAB — CBG MONITORING, ED
GLUCOSE-CAPILLARY: 501 mg/dL — AB (ref 70–99)
Glucose-Capillary: 421 mg/dL — ABNORMAL HIGH (ref 70–99)

## 2014-06-27 LAB — I-STAT CG4 LACTIC ACID, ED: Lactic Acid, Venous: 1.12 mmol/L (ref 0.5–2.0)

## 2014-06-27 MED ORDER — INSULIN REGULAR BOLUS VIA INFUSION
0.0000 [IU] | Freq: Three times a day (TID) | INTRAVENOUS | Status: DC
  Filled 2014-06-27: qty 10

## 2014-06-27 MED ORDER — MIDAZOLAM HCL 2 MG/2ML IJ SOLN
INTRAMUSCULAR | Status: AC
  Filled 2014-06-27: qty 2

## 2014-06-27 MED ORDER — SODIUM CHLORIDE 0.9 % IV SOLN: INTRAVENOUS | Status: DC

## 2014-06-27 MED ORDER — SODIUM CHLORIDE 0.9 % IV BOLUS (SEPSIS)
2000.0000 mL | Freq: Once | INTRAVENOUS | Status: AC
  Administered 2014-06-27: 2000 mL via INTRAVENOUS

## 2014-06-27 MED ORDER — DEXTROSE-NACL 5-0.45 % IV SOLN: INTRAVENOUS | Status: DC

## 2014-06-27 MED ORDER — VANCOMYCIN HCL 10 G IV SOLR
1250.0000 mg | INTRAVENOUS | Status: DC
  Administered 2014-06-28: 1250 mg via INTRAVENOUS
  Filled 2014-06-27 (×3): qty 1250

## 2014-06-27 MED ORDER — SODIUM CHLORIDE 0.9 % IV BOLUS (SEPSIS): 500.0000 mL | Freq: Once | INTRAVENOUS | Status: DC

## 2014-06-27 MED ORDER — DEXTROSE 50 % IV SOLN: 25.0000 mL | INTRAVENOUS | Status: DC | PRN

## 2014-06-27 MED ORDER — FENTANYL CITRATE 0.05 MG/ML IJ SOLN
INTRAMUSCULAR | Status: AC
  Filled 2014-06-27: qty 5

## 2014-06-27 MED ORDER — INSULIN REGULAR HUMAN 100 UNIT/ML IJ SOLN
INTRAMUSCULAR | Status: DC
  Administered 2014-06-27: 3.6 [IU]/h via INTRAVENOUS
  Filled 2014-06-27: qty 2.5

## 2014-06-27 MED ORDER — VANCOMYCIN HCL 10 G IV SOLR
1750.0000 mg | Freq: Once | INTRAVENOUS | Status: AC
  Administered 2014-06-27: 1750 mg via INTRAVENOUS
  Filled 2014-06-27: qty 1750

## 2014-06-27 NOTE — ED Notes (Signed)
NPO since noon 06/27/14

## 2014-06-27 NOTE — ED Notes (Signed)
Patient here with complaint of left hand swelling and pain. States that he was seen by PCP and had site drained Tuesday. Infection initially caused by splintered wood. Area currently about the size of a golf ball. Patient is diabetic with poor control. Reports CBG at doctors office >500.

## 2014-06-27 NOTE — ED Provider Notes (Signed)
Patient was seen and examined by me. Patient with poorly controlled diabetes, presents with an abscess to the left hand over his thenar eminence. Patient states abscess started to form 2 days ago. He had a similar abscess to the same hand a month ago which was incised and drained by an urgent care and he was started on clindamycin. If it healed well and he followed up with his doctor twice since then. He reports having a splinter in that hand about 2 months ago.  Patient is tachycardic, afebrile, blood pressure oxygen saturation stable at this time. He is in no distress. His blood sugars greater than 500. I do not think patient is septic although tachycardic, he has normal white blood cell count and normal lactic acid. I suspect his tachycardia is probably from dehydration from hyperglycemia. Limitted fluids to 1L due to CHF.  Blood cultures were sent just in case. Will cover with vancomycin.  Patient discussed with Dr. Fredna Dow who will take him to a wire tonight. Patient has been nothing by mouth. Also discussed with tried hospitalist who will admit him.  Filed Vitals:   06/27/14 2207 06/27/14 2215 06/27/14 2230 06/27/14 2245  BP:  141/99 145/104 142/103  Pulse:  124 123 124  Temp: 98.4 F (36.9 C)     TempSrc: Rectal     Resp:  14 13 17   Height:      Weight:      SpO2:  99% 99% 99%     Jeannett Senior, PA-C 06/27/14 [place], DO 06/29/14 [phone]

## 2014-06-27 NOTE — ED Provider Notes (Signed)
Medical screening examination/treatment/procedure(s) were conducted as a shared visit with non-physician practitioner(s) and myself.  I personally evaluated the patient during the encounter.    44yo M, c/o increasing left palmar "swelling" and "red rash" for the past several weeks. Pt states he was originally evaluated at [facility] after "getting a wood splinter there" approximately 3 weeks ago. The area was I&D and pt states he took an entire course of clindamycin. Pt states his left thenar area "got big again." Pt also states for the past several days to week he has had increased thirst, urination and excessive fatigue. Pt states these symptoms are typical "when my blood sugar gets high." Pt has not checked his CBG in several weeks to months. Pt is afebrile, tachycardic with stable BP. NAD, CTA, tachycardic, abd soft/NT, neuro non-focal, +left thenar eminence with significant edema, fluctuance and overlying erythema. No open wounds. NMS intact left hand. CBG 500's, not acidotic. Na corrects to 129 for elevated glucose. IVF bolus given, followed by IV insulin gtt. BC x2 obtained before starting IV vancomycin. Hand Surgery Dr. Fredna Dow will take to OR, agrees with abx choice. [facility] to admit to stepdown.     MDM Reviewed: previous chart, nursing note and vitals Reviewed previous: labs Interpretation: ECG, labs and x-ray Total time providing critical care: 30-74 minutes. This excludes time spent performing separately reportable procedures and services. Consults: Copy, Admitting MD.   CRITICAL CARE Performed by: Alfonzo Feller Total critical care time: 40 Critical care time was exclusive of separately billable procedures and treating other patients. Critical care was necessary to treat or prevent imminent or life-threatening deterioration. Critical care was time spent personally by me on the following activities: development of treatment plan with patient and/or surrogate as well as nursing,  discussions with consultants, evaluation of patient's response to treatment, examination of patient, obtaining history from patient or surrogate, ordering and performing treatments and interventions, ordering and review of laboratory studies, ordering and review of radiographic studies, pulse oximetry and re-evaluation of patient's condition.    Filed Vitals:   06/27/14 2207 06/27/14 2215 06/27/14 2230 06/27/14 2245  BP:  141/99 145/104 142/103  Pulse:  124 123 124  Temp: 98.4 F (36.9 C)     TempSrc: Rectal     Resp:  14 13 17   Height:      Weight:      SpO2:  99% 99% 99%     ED ECG REPORT   Date: 06/27/2014  Rate: 123  Rhythm: sinus tachycardia  QRS Axis: left  Intervals: QT prolonged  ST/T Wave abnormalities: nonspecific ST/T changes  Conduction Disutrbances:none  Narrative Interpretation:   Old EKG Reviewed: changes noted; QTc is now prolonged and NS STTW changes are present compared to previous EKG dated 09/22/13.    Results for orders placed or performed during the hospital encounter of [id number]  Basic metabolic panel  Result Value Ref Range   Sodium 123 (L) 135 - 145 mmol/L   Potassium 4.6 3.5 - 5.1 mmol/L   Chloride 86 (L) 96 - 112 mmol/L   CO2 30 19 - 32 mmol/L   Glucose, Bld 506 (H) 70 - 99 mg/dL   BUN 31 (H) 6 - 23 mg/dL   Creatinine, Ser 1.67 (H) 0.50 - 1.35 mg/dL   Calcium 9.0 8.4 - 10.5 mg/dL   GFR calc non Af Amer 48 (L) >90 mL/min   GFR calc Af Amer 56 (L) >90 mL/min   Anion gap 7 5 - 15  CBC  with Differential  Result Value Ref Range   WBC 10.3 4.0 - 10.5 K/uL   RBC 4.59 4.22 - 5.81 MIL/uL   Hemoglobin 12.7 (L) 13.0 - 17.0 g/dL   HCT 37.1 (L) 39.0 - 52.0 %   MCV 80.8 78.0 - 100.0 fL   MCH 27.7 26.0 - 34.0 pg   MCHC 34.2 30.0 - 36.0 g/dL   RDW 16.5 (H) 11.5 - 15.5 %   Platelets 361 150 - 400 K/uL   Neutrophils Relative % 73 43 - 77 %   Neutro Abs 7.5 1.7 - 7.7 K/uL   Lymphocytes Relative 14 12 - 46 %   Lymphs Abs 1.4 0.7 - 4.0 K/uL   Monocytes  Relative 6 3 - 12 %   Monocytes Absolute 0.6 0.1 - 1.0 K/uL   Eosinophils Relative 6 (H) 0 - 5 %   Eosinophils Absolute 0.7 0.0 - 0.7 K/uL   Basophils Relative 1 0 - 1 %   Basophils Absolute 0.1 0.0 - 0.1 K/uL  CBG monitoring, ED  Result Value Ref Range   Glucose-Capillary 501 (H) 70 - 99 mg/dL  I-Stat CG4 Lactic Acid, ED  Result Value Ref Range   Lactic Acid, Venous 1.12 0.5 - 2.0 mmol/L  I-Stat arterial blood gas, ED  Result Value Ref Range   pH, Arterial 7.415 7.350 - 7.450   pCO2 arterial 39.3 35.0 - 45.0 mmHg   pO2, Arterial 64.0 (L) 80.0 - 100.0 mmHg   Bicarbonate 25.2 (H) 20.0 - 24.0 mEq/L   TCO2 26 0 - 100 mmol/L   O2 Saturation 93.0 %   Acid-Base Excess 1.0 0.0 - 2.0 mmol/L   Patient temperature 98.6 F    Collection site RADIAL, ALLEN'S TEST ACCEPTABLE    Drawn by RT    Sample type ARTERIAL    Dg Hand Complete Left 06/06/2014   CLINICAL DATA:  Splinter wound in the thenar are region of the left hand 5 months ago, the far body with fall to be removed, last night hand contain painful with swelling in this region.  EXAM: LEFT HAND - COMPLETE 3+ VIEW  COMPARISON:  None.  FINDINGS: The bones of the left hand are adequately mineralized. There is no lytic or blastic lesion or periosteal reaction. The joint spaces are preserved. There is soft tissue swelling diffusely in the thenar region. No soft tissue gas collections are demonstrated. No radiopaque foreign body is seen.  IMPRESSION: There is soft tissue swelling in the thenar region consistent with clinically suspected cellulitis. No foreign body is demonstrated. There are no findings to suggest osteomyelitis.   Electronically Signed   By: David  Martinique   On: 06/06/2014 16:11          Francine Graven, DO 06/29/14 [phone]

## 2014-06-27 NOTE — ED Provider Notes (Signed)
CSN: [id number]     Arrival date & time 06/27/14  1744 History   First MD Initiated Contact with Patient 06/27/14 2046     Chief Complaint  Patient presents with  . Abscess  . Hand Pain    HPI   61 YOF presents with history of diabetes and hand abscess presents with abscess. Pt reports that 2 months ago he had a splinter in the th left thenar eminence of his right hand. The hand slowly became more painful and swollen until pt sought medical care approx 10 days ago. Pt reports that abscess was drained and he was given clindamycin to take at home. He reports medical compliance but report a return of  Infection. He followed up with PCP two days ago and given an extended dose of the clindamycin. At presentation day pt reports pain only to the site of infection; denies pain of wrist, hands, or fingers. Denies loss of distal sensation or function, denies fever, n/v, diarrhea, abdominal pain, headaches, LEE, or any other signs of infection.   Pt reports a history of uncontrolled diabetes. He states that because of decreased renal function he was taken off of his previous regime and now only take one medication. He reports that he does not regularly check his blood sugar but notes that he has had increased thirst, urination, and blurred vision.    Past Medical History  Diagnosis Date  . Bronchitis   . Croup   . Tobacco abuse   . Dizziness   . Abscess of perineum   . Heartburn   . Indigestion   . H/O folliculitis   . 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 Alcohol abuse   . Hypertension     variable in nature  . Hyperlipidemia   . Multiple excoriations     multifactorial in origin  . Mild obesity   . Dietary noncompliance     History of,  . Situational stress     secondary to financial issues  . Erectile dysfunction     Multifactorial. Improved with Cialis. New onset.  . Cystic acne     with acne keloidosis  . Diabetes mellitus without complication     Type 2,  uncontrolled with questionable improvement  . 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 EKG, abnormal     History of, with negative cardiac evaluation by history.  [name] COPD (chronic obstructive pulmonary disease)     moderate obstruction with low vital capacity  . Asthma     History of,  . CHF (congestive heart failure)    Past Surgical History  Procedure Laterality Date  . Incision and drainage perirectal abscess N/A 02/17/2014    Procedure: IRRIGATION AND DEBRIDEMENT PERIRECTAL  AND SCROTAL ABSCESS;  Surgeon: Coralie Keens, MD;  Location: [facility] OR;  Service: General;  Laterality: N/A;   Family History  Problem Relation Age of Onset  . Diabetes type II Mother   . Hypertension Mother    History  Substance Use Topics  . Smoking status: Current Every Day Smoker -- 1.00 packs/day for 20 years    Types: Cigarettes  . Smokeless tobacco: Not on file  . Alcohol Use: 7.2 oz/week    10 Cans of beer, 2 Shots of liquor per week     Comment: As of 04/06/14 - states occasional use    Review of Systems  All other systems reviewed and are negative.   Allergies  Review of patient's allergies indicates no known allergies.  Home Medications   Prior to Admission medications   Medication Sig Start Date End Date Taking? Authorizing Provider  aspirin EC 81 MG EC tablet Take 1 tablet (81 mg total) by mouth daily. 09/23/13   Ejiroghene Arlyce Dice, MD  carvedilol (COREG) 25 MG tablet Take 25 mg by mouth 2 (two) times daily with a meal.    Historical Provider, MD  clindamycin (CLEOCIN) 300 MG capsule Take 1 capsule (300 mg total) by mouth 3 (three) times daily. 06/06/14   Timmothy Euler, MD  folic acid (FOLVITE) 1 MG tablet Take 1 tablet (1 mg total) by mouth daily. 04/08/14   Thurnell Lose, MD  furosemide (LASIX) 40 MG tablet Take 1 tablet (40 mg total) by mouth daily. 04/08/14   Thurnell Lose, MD  glimepiride (AMARYL) 4 MG tablet Take 4 mg by mouth daily. 03/18/14    Historical Provider, MD  HYDROcodone-acetaminophen (NORCO) 5-325 MG per tablet Take 1 tablet by mouth every 6 (six) hours as needed for moderate pain. 06/06/14   Timmothy Euler, MD  lisinopril (PRINIVIL,ZESTRIL) 20 MG tablet Take 20 mg by mouth daily.    Historical Provider, MD  metFORMIN (GLUCOPHAGE) 1000 MG tablet Take 1,000 mg by mouth 2 (two) times daily with a meal.    Historical Provider, MD  Multiple Vitamins-Minerals (CENTRUM ADULTS PO) Take 1 tablet by mouth daily.    Historical Provider, MD  simvastatin (ZOCOR) 40 MG tablet Take 1 tablet (40 mg total) by mouth daily. 09/23/13   Ejiroghene Arlyce Dice, MD  spironolactone (ALDACTONE) 25 MG tablet Take 1 tablet (25 mg total) by mouth daily. 09/23/13   Ejiroghene Arlyce Dice, MD  thiamine 100 MG tablet Take 1 tablet (100 mg total) by mouth daily. 04/08/14   Thurnell Lose, MD   BP 128/89 mmHg  Pulse 124  Temp(Src) 98.4 F (36.9 C) (Rectal)  Resp 20  Ht 5\' 11"  (1.803 m)  Wt 237 lb (107.502 kg)  BMI 33.07 kg/m2  SpO2 95% Physical Exam  Constitutional: He is oriented to person, place, and time. He appears well-developed and well-nourished.  HENT:  Head: Normocephalic and atraumatic.  Eyes: Pupils are equal, round, and reactive to light.  Neck: Normal range of motion. Neck supple. No JVD present.  Cardiovascular: Regular rhythm, normal heart sounds and intact distal pulses.  Exam reveals no gallop and no friction rub.   No murmur heard. Pulmonary/Chest: Effort normal and breath sounds normal. No stridor. No respiratory distress. He has no wheezes. He has no rales. He exhibits no tenderness.  Abdominal: Soft. There is no tenderness.  Musculoskeletal: Normal range of motion.  Left hand with 4x3.5 cm fluctuant mass over the thenar eminence. Erythema with surrounding swelling. Strength, sensation, and perfusion intact to fingers of hand. No swelling of wrist.  Lymphadenopathy:    He has no cervical adenopathy.  Neurological: He is alert  and oriented to person, place, and time. Coordination normal.  Skin: Skin is warm and dry.  Psychiatric: He has a normal mood and affect. His behavior is normal. Judgment and thought content normal.  Nursing note and vitals reviewed.   ED Course  Procedures (including critical care time) Labs Review Labs Reviewed  BASIC METABOLIC PANEL - Abnormal; Notable for the following:    Sodium 123 (*)    Chloride 86 (*)    Glucose, Bld 506 (*)    BUN 31 (*)    Creatinine, Ser  1.67 (*)    GFR calc non Af Amer 48 (*)    GFR calc Af Amer 56 (*)    All other components within normal limits  CBC WITH DIFFERENTIAL/PLATELET - Abnormal; Notable for the following:    Hemoglobin 12.7 (*)    HCT 37.1 (*)    RDW 16.5 (*)    Eosinophils Relative 6 (*)    All other components within normal limits  CBG MONITORING, ED - Abnormal; Notable for the following:    Glucose-Capillary 501 (*)    All other components within normal limits  I-STAT ARTERIAL BLOOD GAS, ED - Abnormal; Notable for the following:    pO2, Arterial 64.0 (*)    Bicarbonate 25.2 (*)    All other components within normal limits  CULTURE, BLOOD (ROUTINE X 2)  CULTURE, BLOOD (ROUTINE X 2)  ETHANOL  I-STAT CG4 LACTIC ACID, ED    Imaging Review No results found.   EKG Interpretation None      MDM   Final diagnoses:  Abscess of hand    Pt was evaluated with concerns of deep tissue infection. Dr. Morene Rankins with orthopedics  was consulted and will be taking pt to the OR for surgery. Pt was given insulin for blood sugar control, and started on vancomycin per orthopedics. Hospitalist was consulted for admission.      Okey Regal, PA-C 06/29/14 [place], DO 06/30/14 [phone]

## 2014-06-27 NOTE — Anesthesia Preprocedure Evaluation (Signed)
Anesthesia Evaluation  Patient identified by MRN, date of birth, ID band Patient awake    Reviewed: Allergy & Precautions, H&P , NPO status , Patient's Chart, lab work & pertinent test results, reviewed documented beta blocker date and time   History of Anesthesia Complications (+) history of anesthetic complications  Airway Mallampati: II  TM Distance: >3 FB Neck ROM: Full    Dental  (+) Teeth Intact, Dental Advisory Given   Pulmonary asthma , COPDCurrent Smoker,  breath sounds clear to auscultation        Cardiovascular hypertension, Pt. on medications and Pt. on home beta blockers +CHF (EF 25-30% per echo in June 2015) Rhythm:Regular     Neuro/Psych    GI/Hepatic negative GI ROS, Neg liver ROS,   Endo/Other  diabetes, Poorly Controlled, Oral Hypoglycemic Agents  Renal/GU negative Renal ROS     Musculoskeletal   Abdominal   Peds  Hematology   Anesthesia Other Findings   Reproductive/Obstetrics                             Anesthesia Physical  Anesthesia Plan  ASA: III  Anesthesia Plan: General   Post-op Pain Management:    Induction: Intravenous  Airway Management Planned: Oral ETT  Additional Equipment:   Intra-op Plan:   Post-operative Plan: Extubation in OR  Informed Consent: I have reviewed the patients History and Physical, chart, labs and discussed the procedure including the risks, benefits and alternatives for the proposed anesthesia with the patient or authorized representative who has indicated his/her understanding and acceptance.   Dental advisory given  Plan Discussed with: CRNA and Anesthesiologist  Anesthesia Plan Comments:         Anesthesia Quick Evaluation

## 2014-06-27 NOTE — Progress Notes (Signed)
ANTIBIOTIC CONSULT NOTE - INITIAL  Pharmacy Consult for Vancomycin Indication: wound infection  No Known Allergies  Patient Measurements: Height: 5\' 11"  (180.3 cm) Weight: 237 lb (107.502 kg) IBW/kg (Calculated) : 75.3  Vital Signs: Temp: 98.1 F (36.7 C) (03/17 2042) Temp Source: Oral (03/17 2042) BP: 128/89 mmHg (03/17 2100) Pulse Rate: 124 (03/17 2100) Intake/Output from previous day:   Intake/Output from this shift:    Labs: No results for input(s): WBC, HGB, PLT, LABCREA, CREATININE in the last 72 hours. CrCl cannot be calculated (Unknown ideal weight.). No results for input(s): VANCOTROUGH, VANCOPEAK, VANCORANDOM, GENTTROUGH, GENTPEAK, GENTRANDOM, TOBRATROUGH, TOBRAPEAK, TOBRARND, AMIKACINPEAK, AMIKACINTROU, AMIKACIN in the last 72 hours.   Microbiology: Recent Results (from the past 720 hour(s))  Culture, routine-abscess     Status: None   Collection Time: 06/06/14  3:56 PM  Result Value Ref Range Status   Specimen Description ABSCESS HAND LEFT  Final   Special Requests NONE  Final   Gram Stain   Final    ABUNDANT WBC PRESENT,BOTH PMN AND MONONUCLEAR NO SQUAMOUS EPITHELIAL CELLS SEEN FEW GRAM POSITIVE COCCI IN PAIRS Performed at [facility]    Culture   Final    MULTIPLE ORGANISMS PRESENT, NONE PREDOMINANT Note: NO STAPHYLOCOCCUS AUREUS ISOLATED NO GROUP A STREP (S.PYOGENES) ISOLATED Performed at [facility]    Report Status 06/10/2014 FINAL  Final    Medical History: Past Medical History  Diagnosis Date  . Bronchitis   . Croup   . Tobacco abuse   . Dizziness   . Abscess of perineum   . Heartburn   . Indigestion   . H/O folliculitis   . 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 Alcohol abuse   . Hypertension     variable in nature  . Hyperlipidemia   . Multiple excoriations     multifactorial in origin  . Mild obesity   . Dietary noncompliance     History of,  . Situational stress     secondary to financial  issues  . Erectile dysfunction     Multifactorial. Improved with Cialis. New onset.  . Cystic acne     with acne keloidosis  . Diabetes mellitus without complication     Type 2, uncontrolled with questionable improvement  . 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 EKG, abnormal     History of, with negative cardiac evaluation by history.  [name] COPD (chronic obstructive pulmonary disease)     moderate obstruction with low vital capacity  . Asthma     History of,  . CHF (congestive heart failure)     Medications:   (Not in a hospital admission) Scheduled:   Infusions:  . sodium chloride    . vancomycin     Assessment: 44yo male presents with L hand swelling and pain from which he had the site drained Tues by PCP. Pharmacy is consulted to dose vancomycin for wound infection. Pt is afebrile, WBC wnl, LA wnl, sCr 1.7.  Goal of Therapy:  Vancomycin trough level 10-15 mcg/ml  Plan:  Vancomycin 1750mg  IV once followed by 1250mg  IV q24h Measure antibiotic drug levels at steady state Follow up culture results, renal function, and clinical course  Andrey Cota. Diona Foley, PharmD Clinical Pharmacist Pager [phone] 06/27/2014,9:37 PM

## 2014-06-27 NOTE — ED Notes (Signed)
NO RESPONSE

## 2014-06-27 NOTE — Progress Notes (Signed)
ABG    Component Value Date/Time   PHART 7.415 06/27/2014 2134   PCO2ART 39.3 06/27/2014 2134   PO2ART 64.0* 06/27/2014 2134   HCO3 25.2* 06/27/2014 2134   TCO2 26 06/27/2014 2134   O2SAT 93.0 06/27/2014 2134   Pt on RA, resting comfortably.

## 2014-06-28 ENCOUNTER — Encounter (HOSPITAL_COMMUNITY): Payer: Self-pay | Admitting: Surgery

## 2014-06-28 ENCOUNTER — Inpatient Hospital Stay (HOSPITAL_COMMUNITY): Payer: 59 | Admitting: Certified Registered"

## 2014-06-28 ENCOUNTER — Encounter (HOSPITAL_COMMUNITY)
Admission: EM | Disposition: A | Discharge status: Home / Self Care | Payer: 59 | Source: Home / Self Care | Attending: Internal Medicine

## 2014-06-28 DIAGNOSIS — I5022 Chronic systolic (congestive) heart failure: Secondary | ICD-10-CM

## 2014-06-28 DIAGNOSIS — I10 Essential (primary) hypertension: Secondary | ICD-10-CM

## 2014-06-28 DIAGNOSIS — L02512 Cutaneous abscess of left hand: Principal | ICD-10-CM

## 2014-06-28 DIAGNOSIS — R739 Hyperglycemia, unspecified: Secondary | ICD-10-CM

## 2014-06-28 DIAGNOSIS — L02519 Cutaneous abscess of unspecified hand: Secondary | ICD-10-CM | POA: Insufficient documentation

## 2014-06-28 DIAGNOSIS — E119 Type 2 diabetes mellitus without complications: Secondary | ICD-10-CM

## 2014-06-28 HISTORY — PX: I & D EXTREMITY: SHX5045

## 2014-06-28 LAB — CBG MONITORING, ED
GLUCOSE-CAPILLARY: 253 mg/dL — AB (ref 70–99)
Glucose-Capillary: 117 mg/dL — ABNORMAL HIGH (ref 70–99)
Glucose-Capillary: 137 mg/dL — ABNORMAL HIGH (ref 70–99)
Glucose-Capillary: 202 mg/dL — ABNORMAL HIGH (ref 70–99)
Glucose-Capillary: 263 mg/dL — ABNORMAL HIGH (ref 70–99)
Glucose-Capillary: 290 mg/dL — ABNORMAL HIGH (ref 70–99)
Glucose-Capillary: 396 mg/dL — ABNORMAL HIGH (ref 70–99)
Glucose-Capillary: 90 mg/dL (ref 70–99)

## 2014-06-28 LAB — COMPREHENSIVE METABOLIC PANEL
ALT: 11 U/L (ref 0–53)
AST: 13 U/L (ref 0–37)
Albumin: 2.9 g/dL — ABNORMAL LOW (ref 3.5–5.2)
Alkaline Phosphatase: 124 U/L — ABNORMAL HIGH (ref 39–117)
Anion gap: 9 (ref 5–15)
BILIRUBIN TOTAL: 1.3 mg/dL — AB (ref 0.3–1.2)
BUN: 23 mg/dL (ref 6–23)
CALCIUM: 9.1 mg/dL (ref 8.4–10.5)
CHLORIDE: 96 mmol/L (ref 96–112)
CO2: 26 mmol/L (ref 19–32)
CREATININE: 1.13 mg/dL (ref 0.50–1.35)
GFR calc Af Amer: 89 mL/min — ABNORMAL LOW (ref >90)
GFR, EST NON AFRICAN AMERICAN: 77 mL/min — AB (ref >90)
GLUCOSE: 81 mg/dL (ref 70–99)
Potassium: 4 mmol/L (ref 3.5–5.1)
Sodium: 131 mmol/L — ABNORMAL LOW (ref 135–145)
Total Protein: 8.3 g/dL (ref 6.0–8.3)

## 2014-06-28 LAB — BASIC METABOLIC PANEL
ANION GAP: 10 (ref 5–15)
BUN: 26 mg/dL — ABNORMAL HIGH (ref 6–23)
CALCIUM: 9.1 mg/dL (ref 8.4–10.5)
CO2: 26 mmol/L (ref 19–32)
CREATININE: 1.33 mg/dL (ref 0.50–1.35)
Chloride: 91 mmol/L — ABNORMAL LOW (ref 96–112)
GFR calc non Af Amer: 63 mL/min — ABNORMAL LOW (ref >90)
GFR, EST AFRICAN AMERICAN: 73 mL/min — AB (ref >90)
Glucose, Bld: 357 mg/dL — ABNORMAL HIGH (ref 70–99)
Potassium: 4.2 mmol/L (ref 3.5–5.1)
Sodium: 127 mmol/L — ABNORMAL LOW (ref 135–145)

## 2014-06-28 LAB — GLUCOSE, CAPILLARY
GLUCOSE-CAPILLARY: 247 mg/dL — AB (ref 70–99)
Glucose-Capillary: 226 mg/dL — ABNORMAL HIGH (ref 70–99)
Glucose-Capillary: 239 mg/dL — ABNORMAL HIGH (ref 70–99)
Glucose-Capillary: 305 mg/dL — ABNORMAL HIGH (ref 70–99)

## 2014-06-28 LAB — PROTIME-INR
INR: 1.11 (ref 0.00–1.49)
Prothrombin Time: 14.4 seconds (ref 11.6–15.2)

## 2014-06-28 LAB — CBC WITH DIFFERENTIAL/PLATELET
Basophils Absolute: 0 10*3/uL (ref 0.0–0.1)
Basophils Relative: 0 % (ref 0–1)
EOS ABS: 0.8 10*3/uL — AB (ref 0.0–0.7)
EOS PCT: 8 % — AB (ref 0–5)
HEMATOCRIT: 37.5 % — AB (ref 39.0–52.0)
HEMOGLOBIN: 12.9 g/dL — AB (ref 13.0–17.0)
LYMPHS ABS: 1.5 10*3/uL (ref 0.7–4.0)
Lymphocytes Relative: 16 % (ref 12–46)
MCH: 28.2 pg (ref 26.0–34.0)
MCHC: 34.4 g/dL (ref 30.0–36.0)
MCV: 81.9 fL (ref 78.0–100.0)
MONO ABS: 0.6 10*3/uL (ref 0.1–1.0)
Monocytes Relative: 7 % (ref 3–12)
Neutro Abs: 6.9 10*3/uL (ref 1.7–7.7)
Neutrophils Relative %: 69 % (ref 43–77)
Platelets: 375 10*3/uL (ref 150–400)
RBC: 4.58 MIL/uL (ref 4.22–5.81)
RDW: 16.7 % — ABNORMAL HIGH (ref 11.5–15.5)
WBC: 9.9 10*3/uL (ref 4.0–10.5)

## 2014-06-28 LAB — SEDIMENTATION RATE: Sed Rate: 29 mm/hr — ABNORMAL HIGH (ref 0–16)

## 2014-06-28 LAB — C-REACTIVE PROTEIN: CRP: 6.5 mg/dL — ABNORMAL HIGH (ref <0.60)

## 2014-06-28 SURGERY — IRRIGATION AND DEBRIDEMENT EXTREMITY
Anesthesia: General | Site: Hand | Laterality: Left

## 2014-06-28 MED ORDER — DIGOXIN 250 MCG PO TABS: 0.2500 mg | ORAL_TABLET | Freq: Every day | ORAL | Status: DC

## 2014-06-28 MED ORDER — IVABRADINE HCL 5 MG PO TABS
5.0000 mg | ORAL_TABLET | Freq: Every day | ORAL | Status: DC
  Administered 2014-06-29 – 2014-06-30 (×2): 5 mg via ORAL
  Filled 2014-06-28 (×4): qty 1

## 2014-06-28 MED ORDER — PHENYLEPHRINE HCL 10 MG/ML IJ SOLN
INTRAMUSCULAR | Status: DC | PRN
  Administered 2014-06-28: 40 ug via INTRAVENOUS

## 2014-06-28 MED ORDER — FENTANYL CITRATE 0.05 MG/ML IJ SOLN
25.0000 ug | INTRAMUSCULAR | Status: DC | PRN
  Administered 2014-06-28: 100 ug via INTRAVENOUS
  Administered 2014-06-28: 50 ug via INTRAVENOUS

## 2014-06-28 MED ORDER — OXYCODONE HCL 5 MG PO TABS
ORAL_TABLET | ORAL | Status: AC
  Administered 2014-06-28: 5 mg via ORAL
  Filled 2014-06-28: qty 1

## 2014-06-28 MED ORDER — OXYCODONE HCL 5 MG/5ML PO SOLN: 5.0000 mg | Freq: Once | ORAL | Status: AC | PRN

## 2014-06-28 MED ORDER — ACETAMINOPHEN 325 MG PO TABS
650.0000 mg | ORAL_TABLET | Freq: Four times a day (QID) | ORAL | Status: DC | PRN
Start: 2014-06-28 — End: 2014-06-30

## 2014-06-28 MED ORDER — PROPOFOL 10 MG/ML IV BOLUS
INTRAVENOUS | Status: AC
  Filled 2014-06-28: qty 20

## 2014-06-28 MED ORDER — FENTANYL CITRATE 0.05 MG/ML IJ SOLN
INTRAMUSCULAR | Status: AC
  Administered 2014-06-28: 100 ug via INTRAVENOUS
  Filled 2014-06-28: qty 2

## 2014-06-28 MED ORDER — HEPARIN SODIUM (PORCINE) 5000 UNIT/ML IJ SOLN
5000.0000 [IU] | Freq: Three times a day (TID) | INTRAMUSCULAR | Status: DC
  Administered 2014-06-28 – 2014-06-30 (×7): 5000 [IU] via SUBCUTANEOUS
  Filled 2014-06-28 (×9): qty 1

## 2014-06-28 MED ORDER — CHLORHEXIDINE GLUCONATE 4 % EX LIQD
60.0000 mL | Freq: Once | CUTANEOUS | Status: DC
  Filled 2014-06-28: qty 60

## 2014-06-28 MED ORDER — MIDAZOLAM HCL 2 MG/2ML IJ SOLN
INTRAMUSCULAR | Status: AC
  Filled 2014-06-28: qty 2

## 2014-06-28 MED ORDER — MIDAZOLAM HCL 5 MG/5ML IJ SOLN
INTRAMUSCULAR | Status: DC | PRN
  Administered 2014-06-28: 1 mg via INTRAVENOUS

## 2014-06-28 MED ORDER — ONDANSETRON HCL 4 MG/2ML IJ SOLN: 4.0000 mg | Freq: Four times a day (QID) | INTRAMUSCULAR | Status: DC | PRN

## 2014-06-28 MED ORDER — LACTATED RINGERS IV SOLN
INTRAVENOUS | Status: DC
  Administered 2014-06-28: 15:00:00 via INTRAVENOUS

## 2014-06-28 MED ORDER — DIGOXIN 250 MCG PO TABS
0.2500 mg | ORAL_TABLET | Freq: Every day | ORAL | Status: DC
  Administered 2014-06-28 – 2014-06-30 (×3): 0.25 mg via ORAL
  Filled 2014-06-28 (×3): qty 1

## 2014-06-28 MED ORDER — POTASSIUM CHLORIDE CRYS ER 20 MEQ PO TBCR: 20.0000 meq | EXTENDED_RELEASE_TABLET | Freq: Two times a day (BID) | ORAL | Status: DC

## 2014-06-28 MED ORDER — ASPIRIN EC 81 MG PO TBEC: 81.0000 mg | DELAYED_RELEASE_TABLET | Freq: Every day | ORAL | Status: DC

## 2014-06-28 MED ORDER — BUPIVACAINE HCL (PF) 0.25 % IJ SOLN
INTRAMUSCULAR | Status: AC
  Filled 2014-06-28: qty 30

## 2014-06-28 MED ORDER — LACTATED RINGERS IV SOLN
INTRAVENOUS | Status: DC | PRN
  Administered 2014-06-28: 15:00:00 via INTRAVENOUS

## 2014-06-28 MED ORDER — FENTANYL CITRATE 0.05 MG/ML IJ SOLN
INTRAMUSCULAR | Status: AC
  Filled 2014-06-28: qty 5

## 2014-06-28 MED ORDER — INSULIN ASPART 100 UNIT/ML ~~LOC~~ SOLN: 0.0000 [IU] | Freq: Every day | SUBCUTANEOUS | Status: DC

## 2014-06-28 MED ORDER — OXYCODONE HCL 5 MG PO TABS
5.0000 mg | ORAL_TABLET | Freq: Once | ORAL | Status: AC | PRN
  Administered 2014-06-28: 5 mg via ORAL

## 2014-06-28 MED ORDER — LISINOPRIL 40 MG PO TABS
40.0000 mg | ORAL_TABLET | Freq: Every day | ORAL | Status: DC
  Administered 2014-06-28 – 2014-06-30 (×3): 40 mg via ORAL
  Filled 2014-06-28 (×3): qty 1

## 2014-06-28 MED ORDER — SIMVASTATIN 40 MG PO TABS
40.0000 mg | ORAL_TABLET | Freq: Every day | ORAL | Status: DC
  Administered 2014-06-29 – 2014-06-30 (×2): 40 mg via ORAL
  Filled 2014-06-28 (×3): qty 1

## 2014-06-28 MED ORDER — HYDROCODONE-ACETAMINOPHEN 5-325 MG PO TABS
1.0000 | ORAL_TABLET | Freq: Four times a day (QID) | ORAL | Status: DC | PRN
Start: 2014-06-28 — End: 2014-06-30

## 2014-06-28 MED ORDER — ARTIFICIAL TEARS OP OINT
TOPICAL_OINTMENT | OPHTHALMIC | Status: AC
  Filled 2014-06-28: qty 3.5

## 2014-06-28 MED ORDER — LISINOPRIL 40 MG PO TABS: 40.0000 mg | ORAL_TABLET | Freq: Every day | ORAL | Status: DC

## 2014-06-28 MED ORDER — SODIUM CHLORIDE 0.9 % IR SOLN
Status: DC | PRN
  Administered 2014-06-28: 1000 mL

## 2014-06-28 MED ORDER — INSULIN ASPART 100 UNIT/ML ~~LOC~~ SOLN
0.0000 [IU] | Freq: Four times a day (QID) | SUBCUTANEOUS | Status: DC
Start: 2014-06-28 — End: 2014-06-29
  Administered 2014-06-28: 11 [IU] via SUBCUTANEOUS
  Administered 2014-06-29: 3 [IU] via SUBCUTANEOUS
  Administered 2014-06-29: 15 [IU] via SUBCUTANEOUS
  Administered 2014-06-29: 11 [IU] via SUBCUTANEOUS

## 2014-06-28 MED ORDER — ASPIRIN EC 81 MG PO TBEC
81.0000 mg | DELAYED_RELEASE_TABLET | Freq: Every day | ORAL | Status: DC
  Administered 2014-06-28 – 2014-06-30 (×3): 81 mg via ORAL
  Filled 2014-06-28 (×3): qty 1

## 2014-06-28 MED ORDER — CARVEDILOL 25 MG PO TABS
25.0000 mg | ORAL_TABLET | Freq: Two times a day (BID) | ORAL | Status: DC
  Administered 2014-06-28 – 2014-06-30 (×5): 25 mg via ORAL
  Filled 2014-06-28 (×8): qty 1

## 2014-06-28 MED ORDER — ONDANSETRON HCL 4 MG/2ML IJ SOLN
INTRAMUSCULAR | Status: AC
  Filled 2014-06-28: qty 2

## 2014-06-28 MED ORDER — SODIUM CHLORIDE 0.9 % IJ SOLN
3.0000 mL | Freq: Two times a day (BID) | INTRAMUSCULAR | Status: DC
  Administered 2014-06-28 – 2014-06-30 (×3): 3 mL via INTRAVENOUS

## 2014-06-28 MED ORDER — ACETAMINOPHEN 650 MG RE SUPP: 650.0000 mg | Freq: Four times a day (QID) | RECTAL | Status: DC | PRN

## 2014-06-28 MED ORDER — ONDANSETRON HCL 4 MG PO TABS: 4.0000 mg | ORAL_TABLET | Freq: Four times a day (QID) | ORAL | Status: DC | PRN

## 2014-06-28 MED ORDER — POTASSIUM CHLORIDE CRYS ER 20 MEQ PO TBCR
20.0000 meq | EXTENDED_RELEASE_TABLET | Freq: Two times a day (BID) | ORAL | Status: DC
  Administered 2014-06-28 – 2014-06-30 (×4): 20 meq via ORAL
  Filled 2014-06-28 (×5): qty 1

## 2014-06-28 MED ORDER — ARTIFICIAL TEARS OP OINT
TOPICAL_OINTMENT | OPHTHALMIC | Status: DC | PRN
  Administered 2014-06-28: 1 via OPHTHALMIC

## 2014-06-28 MED ORDER — FENTANYL CITRATE 0.05 MG/ML IJ SOLN
INTRAMUSCULAR | Status: DC | PRN
  Administered 2014-06-28 (×2): 50 ug via INTRAVENOUS

## 2014-06-28 MED ORDER — BUPIVACAINE HCL (PF) 0.25 % IJ SOLN
INTRAMUSCULAR | Status: DC | PRN
  Administered 2014-06-28: 9 mL

## 2014-06-28 MED ORDER — OXYCODONE HCL 5 MG PO TABS
5.0000 mg | ORAL_TABLET | ORAL | Status: DC | PRN
  Administered 2014-06-28 – 2014-06-29 (×2): 5 mg via ORAL
  Filled 2014-06-28 (×2): qty 1

## 2014-06-28 MED ORDER — PROPOFOL 10 MG/ML IV BOLUS
INTRAVENOUS | Status: DC | PRN
  Administered 2014-06-28: 200 mg via INTRAVENOUS

## 2014-06-28 MED ORDER — LIDOCAINE HCL (CARDIAC) 20 MG/ML IV SOLN
INTRAVENOUS | Status: AC
  Filled 2014-06-28: qty 5

## 2014-06-28 MED ORDER — LIDOCAINE HCL (CARDIAC) 20 MG/ML IV SOLN
INTRAVENOUS | Status: DC | PRN
  Administered 2014-06-28: 70 mg via INTRAVENOUS

## 2014-06-28 SURGICAL SUPPLY — 64 items
BANDAGE COBAN STERILE 2 (GAUZE/BANDAGES/DRESSINGS) IMPLANT
BANDAGE ELASTIC 3 VELCRO ST LF (GAUZE/BANDAGES/DRESSINGS) ×3 IMPLANT
BANDAGE ELASTIC 4 VELCRO ST LF (GAUZE/BANDAGES/DRESSINGS) IMPLANT
BNDG COHESIVE 1X5 TAN STRL LF (GAUZE/BANDAGES/DRESSINGS) ×3 IMPLANT
BNDG CONFORM 2 STRL LF (GAUZE/BANDAGES/DRESSINGS) IMPLANT
BNDG ESMARK 4X9 LF (GAUZE/BANDAGES/DRESSINGS) IMPLANT
BNDG GAUZE ELAST 4 BULKY (GAUZE/BANDAGES/DRESSINGS) ×3 IMPLANT
CORDS BIPOLAR (ELECTRODE) ×3 IMPLANT
COVER SURGICAL LIGHT HANDLE (MISCELLANEOUS) ×3 IMPLANT
DECANTER SPIKE VIAL GLASS SM (MISCELLANEOUS) IMPLANT
DRAIN PENROSE 1/4X12 LTX STRL (WOUND CARE) IMPLANT
DRSG ADAPTIC 3X8 NADH LF (GAUZE/BANDAGES/DRESSINGS) IMPLANT
DRSG EMULSION OIL 3X3 NADH (GAUZE/BANDAGES/DRESSINGS) IMPLANT
DRSG PAD ABDOMINAL 8X10 ST (GAUZE/BANDAGES/DRESSINGS) IMPLANT
GAUZE PACKING IODOFORM 1/4X15 (GAUZE/BANDAGES/DRESSINGS) ×3 IMPLANT
GAUZE SPONGE 4X4 12PLY STRL (GAUZE/BANDAGES/DRESSINGS) ×3 IMPLANT
GAUZE XEROFORM 1X8 LF (GAUZE/BANDAGES/DRESSINGS) IMPLANT
GLOVE BIO SURGEON STRL SZ7 (GLOVE) ×3 IMPLANT
GLOVE BIO SURGEON STRL SZ7.5 (GLOVE) ×3 IMPLANT
GLOVE BIOGEL PI IND STRL 8 (GLOVE) ×1 IMPLANT
GLOVE BIOGEL PI INDICATOR 8 (GLOVE) ×2
GOWN STRL REIN XL XLG (GOWN DISPOSABLE) IMPLANT
GOWN STRL REUS W/ TWL LRG LVL3 (GOWN DISPOSABLE) ×1 IMPLANT
GOWN STRL REUS W/ TWL XL LVL3 (GOWN DISPOSABLE) ×1 IMPLANT
GOWN STRL REUS W/TWL LRG LVL3 (GOWN DISPOSABLE) ×2
GOWN STRL REUS W/TWL XL LVL3 (GOWN DISPOSABLE) ×2
HANDPIECE INTERPULSE COAX TIP (DISPOSABLE)
HOOD PEEL AWAY FACE SHEILD DIS (HOOD) ×3 IMPLANT
KIT BASIN OR (CUSTOM PROCEDURE TRAY) ×3 IMPLANT
KIT ROOM TURNOVER OR (KITS) ×3 IMPLANT
LOOP VESSEL MAXI BLUE (MISCELLANEOUS) IMPLANT
LOOP VESSEL MINI RED (MISCELLANEOUS) IMPLANT
MANIFOLD NEPTUNE II (INSTRUMENTS) IMPLANT
NEEDLE HYPO 25GX1X1/2 BEV (NEEDLE) ×3 IMPLANT
NEEDLE HYPO 25X1 1.5 SAFETY (NEEDLE) IMPLANT
NS IRRIG 1000ML POUR BTL (IV SOLUTION) ×3 IMPLANT
PACK ORTHO EXTREMITY (CUSTOM PROCEDURE TRAY) ×3 IMPLANT
PAD ARMBOARD 7.5X6 YLW CONV (MISCELLANEOUS) ×6 IMPLANT
PAD CAST 3X4 CTTN HI CHSV (CAST SUPPLIES) ×1 IMPLANT
PAD CAST 4YDX4 CTTN HI CHSV (CAST SUPPLIES) ×1 IMPLANT
PADDING CAST COTTON 3X4 STRL (CAST SUPPLIES) ×2
PADDING CAST COTTON 4X4 STRL (CAST SUPPLIES) ×2
SCRUB BETADINE 4OZ XXX (MISCELLANEOUS) ×3 IMPLANT
SET CYSTO W/LG BORE CLAMP LF (SET/KITS/TRAYS/PACK) ×3 IMPLANT
SET HNDPC FAN SPRY TIP SCT (DISPOSABLE) IMPLANT
SOLUTION BETADINE 4OZ (MISCELLANEOUS) ×3 IMPLANT
SPLINT PLASTER CAST XFAST 3X15 (CAST SUPPLIES) ×1 IMPLANT
SPLINT PLASTER XTRA FASTSET 3X (CAST SUPPLIES) ×2
SPONGE LAP 18X18 X RAY DECT (DISPOSABLE) IMPLANT
SPONGE LAP 4X18 X RAY DECT (DISPOSABLE) IMPLANT
SUCTION FRAZIER TIP 10 FR DISP (SUCTIONS) ×3 IMPLANT
SUT ETHILON 4 0 PS 2 18 (SUTURE) IMPLANT
SUT MON AB 5-0 P3 18 (SUTURE) IMPLANT
SWAB COLLECTION DEVICE MRSA (MISCELLANEOUS) ×3 IMPLANT
SYR CONTROL 10ML LL (SYRINGE) ×3 IMPLANT
TOWEL OR 17X24 6PK STRL BLUE (TOWEL DISPOSABLE) IMPLANT
TOWEL OR 17X26 10 PK STRL BLUE (TOWEL DISPOSABLE) ×3 IMPLANT
TUBE ANAEROBIC SPECIMEN COL (MISCELLANEOUS) IMPLANT
TUBE CONNECTING 12'X1/4 (SUCTIONS) ×1
TUBE CONNECTING 12X1/4 (SUCTIONS) ×2 IMPLANT
TUBE FEEDING 5FR 15 INCH (TUBING) IMPLANT
UNDERPAD 30X30 INCONTINENT (UNDERPADS AND DIAPERS) ×3 IMPLANT
WATER STERILE IRR 1000ML POUR (IV SOLUTION) IMPLANT
YANKAUER SUCT BULB TIP NO VENT (SUCTIONS) ×3 IMPLANT

## 2014-06-28 NOTE — Progress Notes (Signed)
[facility] TEAM 1 - Stepdown/ICU TEAM Progress Note  Austin Alexander [id number] DOB: 11-Jun-1970 DOA: 06/27/2014 PCP: Kevan Ny, MD  Admit HPI / Brief Narrative: 44 y.o. male with history of diabetes mellitus, hypertension, alcohol abuse, recurrent abscess and cellulitis, and chronic systolic CHF with EF of [id number] who presented with a one-week history of left arm swelling. He mentioned the swelling had been present intermittently for the last few months but had recently become more severe and persistent.    HPI/Subjective: No exam today - pt admitted this morning   Assessment/Plan:  Abscess of left thenar eminence  Chronic systolic heart failure - EF 25%  Uncontrolled DM2  COPD  HTN  EtOH abuse  Code Status: FULL Family Communication:  Disposition Plan:   Consultants: Hand Surgery   Procedures: 3/18 - I&D L hand abscess  Antibiotics: Vancomycin 3/17 >  DVT prophylaxis: SCDs  Objective: Blood pressure 135/101, pulse 122, temperature 98.5 F (36.9 C), temperature source Oral, resp. rate 19, height 5\' 11"  (1.803 m), weight 107.502 kg (237 lb), SpO2 95 %.  Intake/Output Summary (Last 24 hours) at 06/28/14 1658 Last data filed at 06/28/14 1642  Gross per 24 hour  Intake    600 ml  Output    900 ml  Net   -300 ml   Exam: No exam today - pt admitted this morning   Data Reviewed: Basic Metabolic Panel:  Recent Labs Lab 06/27/14 2112 06/28/14 0057 06/28/14 0500  NA 123* 127* 131*  K 4.6 4.2 4.0  CL 86* 91* 96  CO2 30 26 26   GLUCOSE 506* 357* 81  BUN 31* 26* 23  CREATININE 1.67* 1.33 1.13  CALCIUM 9.0 9.1 9.1    Liver Function Tests:  Recent Labs Lab 06/28/14 0500  AST 13  ALT 11  ALKPHOS 124*  BILITOT 1.3*  PROT 8.3  ALBUMIN 2.9*   Coags:  Recent Labs Lab 06/28/14 0500  INR 1.11   CBC:  Recent Labs Lab 06/27/14 2112 06/28/14 0500  WBC 10.3 9.9  NEUTROABS 7.5 6.9  HGB 12.7* 12.9*  HCT 37.1* 37.5*  MCV 80.8 81.9  PLT 361 375     CBG:  Recent Labs Lab 06/28/14 0558 06/28/14 0656 06/28/14 1141 06/28/14 1524 06/28/14 1639  GLUCAP 117* 137* 263* 247* 226*   Studies:  Recent x-ray studies have been reviewed in detail by the Attending Physician  Scheduled Meds:  Scheduled Meds: . [MAR Hold] aspirin EC  81 mg Oral Daily  . [MAR Hold] carvedilol  25 mg Oral BID WC  . chlorhexidine  60 mL Topical Once  . [MAR Hold] digoxin  0.25 mg Oral Daily  . [MAR Hold] heparin  5,000 Units Subcutaneous 3 times per day  . [MAR Hold] insulin aspart  0-15 Units Subcutaneous Q6H  . [MAR Hold] insulin aspart  0-5 Units Subcutaneous QHS  . [MAR Hold] ivabradine  5 mg Oral Daily  . [MAR Hold] lisinopril  40 mg Oral Daily  . [MAR Hold] potassium chloride SA  20 mEq Oral BID  . [MAR Hold] simvastatin  40 mg Oral Daily  . [MAR Hold] sodium chloride  3 mL Intravenous Q12H  . [MAR Hold] vancomycin  1,250 mg Intravenous Q24H    Time spent on care of this patient: no charge   Cherene Altes , MD   [facility] Office  [phone] Pager - Text Page per Amion as per below:  On-Call/Text Page:      [email or web address]  password [email or web address]  If 7PM-7AM, please contact night-coverage [email or web address] Password [email or web address] 06/28/2014, 4:58 PM   LOS: 1 day

## 2014-06-28 NOTE — Anesthesia Postprocedure Evaluation (Signed)
Anesthesia Post Note  Patient: Austin Alexander  Procedure(s) Performed: Procedure(s) (LRB): IRRIGATION AND DEBRIDEMENT EXTREMITY (Left)  Anesthesia type: General  Patient location: PACU  Post pain: Pain level controlled and Adequate analgesia  Post assessment: Post-op Vital signs reviewed, Patient's Cardiovascular Status Stable, Respiratory Function Stable, Patent Airway and Pain level controlled  Last Vitals:  Filed Vitals:   06/28/14 1700  BP:   Pulse: 122  Temp:   Resp: 21    Post vital signs: Reviewed and stable  Level of consciousness: awake, alert  and oriented  Complications: No apparent anesthesia complications

## 2014-06-28 NOTE — ED Notes (Signed)
Dr. Posey Pronto gives verbal order for patient to be allowed to eat breakfast, then he is NPO.

## 2014-06-28 NOTE — Anesthesia Procedure Notes (Signed)
Procedure Name: LMA Insertion Date/Time: 06/28/2014 3:49 PM Performed by: Terrill Mohr Pre-anesthesia Checklist: Emergency Drugs available, Patient identified, Suction available and Patient being monitored Patient Re-evaluated:Patient Re-evaluated prior to inductionOxygen Delivery Method: Circle system utilized Preoxygenation: Pre-oxygenation with 100% oxygen Intubation Type: IV induction Ventilation: Mask ventilation without difficulty LMA: LMA inserted LMA Size: 5.0 Number of attempts: 1 Placement Confirmation: positive ETCO2 and breath sounds checked- equal and bilateral ETT to lip (cm): taped across cheeks. Tube secured with: Tape Dental Injury: Teeth and Oropharynx as per pre-operative assessment

## 2014-06-28 NOTE — Transfer of Care (Signed)
Immediate Anesthesia Transfer of Care Note  Patient: Austin Alexander  Procedure(s) Performed: Procedure(s): IRRIGATION AND DEBRIDEMENT EXTREMITY (Left)  Patient Location: PACU  Anesthesia Type:General  Level of Consciousness: awake, alert , oriented and patient cooperative  Airway & Oxygen Therapy: Patient Spontanous Breathing and Patient connected to nasal cannula oxygen  Post-op Assessment: Report given to RN, Post -op Vital signs reviewed and stable and Patient moving all extremities  Post vital signs: Reviewed and stable  Last Vitals:  Filed Vitals:   06/28/14 1525  BP: 139/95  Pulse: 129  Temp:   Resp:     Complications: No apparent anesthesia complications

## 2014-06-28 NOTE — Progress Notes (Signed)
Subjective: Left thenar eminence abscess.  Continued pain in left hand.  No fevers/chills, night sweats.   Objective: Vital signs in last 24 hours: Temp:  [98.1 F (36.7 C)-98.6 F (37 C)] 98.6 F (37 C) (03/18 0655) Pulse Rate:  [120-128] 127 (03/18 1400) Resp:  [13-24] 22 (03/18 1200) BP: (117-145)/(80-104) 135/101 mmHg (03/18 1400) SpO2:  [92 %-99 %] 95 % (03/18 1400) Weight:  [107.502 kg (237 lb)] 107.502 kg (237 lb) (03/17 2114)  Intake/Output from previous day: 03/17 0701 - 03/18 0700 In: -  Out: 500 [Urine:500] Intake/Output this shift: Total I/O In: -  Out: 400 [Urine:400]   Recent Labs  06/27/14 2112 06/28/14 0500  HGB 12.7* 12.9*    Recent Labs  06/27/14 2112 06/28/14 0500  WBC 10.3 9.9  RBC 4.59 4.58  HCT 37.1* 37.5*  PLT 361 375    Recent Labs  06/28/14 0057 06/28/14 0500  NA 127* 131*  K 4.2 4.0  CL 91* 96  CO2 26 26  BUN 26* 23  CREATININE 1.33 1.13  GLUCOSE 357* 81  CALCIUM 9.1 9.1    Recent Labs  06/28/14 0500  INR 1.11    intact sensation and capillary refill all digits.  +epl/fpl/io.  able to move thumb without pain.  fluctuant area thenar eminence with erythema.  no proximal streaking.    Assessment/Plan: Left thenar eminence abscess.  Plan incision and drainage in OR.  Risks, benefits, and alternatives of surgery were discussed and the patient agrees with the plan of care.    Amron Guerrette R 06/28/2014, 3:22 PM

## 2014-06-28 NOTE — Progress Notes (Signed)
CRNA at bedside.

## 2014-06-28 NOTE — Consult Note (Signed)
Austin Alexander is an 44 y.o. male.   Chief Complaint: left palm abscess HPI: 44 yo rhd male states he got splinter in left palm ~ 2 months ago.  A few weeks ago had abscess of thenar eminence treated with I&D by urgent care.  Followed up with pcp and was on antibiotics.  Thumb improved but in last week has started to swell and become painful again.  No fevers, chills, night sweats.  Past Medical History  Diagnosis Date  . Bronchitis   . Croup   . Tobacco abuse   . Dizziness   . Abscess of perineum   . Heartburn   . Indigestion   . H/O folliculitis   . 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 Alcohol abuse   . Hypertension     variable in nature  . Hyperlipidemia   . Multiple excoriations     multifactorial in origin  . Mild obesity   . Dietary noncompliance     History of,  . Situational stress     secondary to financial issues  . Erectile dysfunction     Multifactorial. Improved with Cialis. New onset.  . Cystic acne     with acne keloidosis  . Diabetes mellitus without complication     Type 2, uncontrolled with questionable improvement  . 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 EKG, abnormal     History of, with negative cardiac evaluation by history.  [name] COPD (chronic obstructive pulmonary disease)     moderate obstruction with low vital capacity  . Asthma     History of,  . CHF (congestive heart failure)     Past Surgical History  Procedure Laterality Date  . Incision and drainage perirectal abscess N/A 02/17/2014    Procedure: IRRIGATION AND DEBRIDEMENT PERIRECTAL  AND SCROTAL ABSCESS;  Surgeon: Coralie Keens, MD;  Location: [facility] OR;  Service: General;  Laterality: N/A;    Family History  Problem Relation Age of Onset  . Diabetes type II Mother   . Hypertension Mother    Social History:  reports that he has been smoking Cigarettes.  He has a 20 pack-year smoking history. He does not have any smokeless tobacco history on  file. He reports that he drinks about 7.2 oz of alcohol per week. He reports that he does not use illicit drugs.  Allergies: No Known Allergies   (Not in a hospital admission)  Results for orders placed or performed during the hospital encounter of 06/27/14 (from the past 48 hour(s))  CBG monitoring, ED     Status: Abnormal   Collection Time: 06/27/14  8:46 PM  Result Value Ref Range   Glucose-Capillary 501 (H) 70 - 99 mg/dL  Basic metabolic panel     Status: Abnormal   Collection Time: 06/27/14  9:12 PM  Result Value Ref Range   Sodium 123 (L) 135 - 145 mmol/L   Potassium 4.6 3.5 - 5.1 mmol/L   Chloride 86 (L) 96 - 112 mmol/L   CO2 30 19 - 32 mmol/L   Glucose, Bld 506 (H) 70 - 99 mg/dL   BUN 31 (H) 6 - 23 mg/dL   Creatinine, Ser 1.67 (H) 0.50 - 1.35 mg/dL   Calcium 9.0 8.4 - 10.5 mg/dL   GFR calc non Af Amer 48 (L) >90 mL/min   GFR calc Af Amer 56 (L) >90 mL/min    Comment: (NOTE) The eGFR has been calculated using  the CKD EPI equation. This calculation has not been validated in all clinical situations. eGFR's persistently <90 mL/min signify possible Chronic Kidney Disease.    Anion gap 7 5 - 15  CBC with Differential     Status: Abnormal   Collection Time: 06/27/14  9:12 PM  Result Value Ref Range   WBC 10.3 4.0 - 10.5 K/uL   RBC 4.59 4.22 - 5.81 MIL/uL   Hemoglobin 12.7 (L) 13.0 - 17.0 g/dL   HCT 37.1 (L) 39.0 - 52.0 %   MCV 80.8 78.0 - 100.0 fL   MCH 27.7 26.0 - 34.0 pg   MCHC 34.2 30.0 - 36.0 g/dL   RDW 16.5 (H) 11.5 - 15.5 %   Platelets 361 150 - 400 K/uL   Neutrophils Relative % 73 43 - 77 %   Neutro Abs 7.5 1.7 - 7.7 K/uL   Lymphocytes Relative 14 12 - 46 %   Lymphs Abs 1.4 0.7 - 4.0 K/uL   Monocytes Relative 6 3 - 12 %   Monocytes Absolute 0.6 0.1 - 1.0 K/uL   Eosinophils Relative 6 (H) 0 - 5 %   Eosinophils Absolute 0.7 0.0 - 0.7 K/uL   Basophils Relative 1 0 - 1 %   Basophils Absolute 0.1 0.0 - 0.1 K/uL  I-Stat CG4 Lactic Acid, ED     Status: None    Collection Time: 06/27/14  9:21 PM  Result Value Ref Range   Lactic Acid, Venous 1.12 0.5 - 2.0 mmol/L  I-Stat arterial blood gas, ED     Status: Abnormal   Collection Time: 06/27/14  9:34 PM  Result Value Ref Range   pH, Arterial 7.415 7.350 - 7.450   pCO2 arterial 39.3 35.0 - 45.0 mmHg   pO2, Arterial 64.0 (L) 80.0 - 100.0 mmHg   Bicarbonate 25.2 (H) 20.0 - 24.0 mEq/L   TCO2 26 0 - 100 mmol/L   O2 Saturation 93.0 %   Acid-Base Excess 1.0 0.0 - 2.0 mmol/L   Patient temperature 98.6 F    Collection site RADIAL, ALLEN'S TEST ACCEPTABLE    Drawn by RT    Sample type ARTERIAL   Ethanol     Status: None   Collection Time: 06/27/14 10:43 PM  Result Value Ref Range   Alcohol, Ethyl (B) <5 0 - 9 mg/dL    Comment:        LOWEST DETECTABLE LIMIT FOR SERUM ALCOHOL IS 11 mg/dL FOR MEDICAL PURPOSES ONLY   CBG monitoring, ED     Status: Abnormal   Collection Time: 06/27/14 11:15 PM  Result Value Ref Range   Glucose-Capillary 421 (H) 70 - 99 mg/dL    No results found.   A comprehensive review of systems was negative.  Blood pressure 142/103, pulse 124, temperature 98.4 F (36.9 C), temperature source Rectal, resp. rate 17, height _0  (1.803 m), weight 107.502 kg (237 lb), SpO2 99 %.  General appearance: alert, cooperative and appears stated age Head: Normocephalic, without obvious abnormality, atraumatic Neck: supple, symmetrical, trachea midline Extremities: intact sensation and capillary refill all digits.  +epl/fpl/io.  right ue: no wounds or ttp.  left ue: ttp thenar eminence.  fluctuant area with small amount of erythema.  no proximal streaking.  healed previous incision site ~ 84m in length.  able to move thumb without pain.  no other ttp. Pulses: 2+ and symmetric Skin: Skin color, texture, turgor normal. No rashes or lesions Neurologic: Grossly normal Incision/Wound: none  Assessment/Plan Left palm abscess.  Recommend incision and drainage in OR.  Discussed with  anesthesiologist and given patient's current medical status, will plan for I&D tomorrow after medical status more stabilized.  Plan I&D in early afternoon.  NPO after breakfast.  Hlee Fringer R 06/28/2014, 12:00 AM

## 2014-06-28 NOTE — ED Notes (Signed)
Called in breakfast tray.

## 2014-06-28 NOTE — Op Note (Signed)
[id number] 

## 2014-06-28 NOTE — Brief Op Note (Signed)
06/27/2014 - 06/28/2014  4:17 PM  PATIENT:  Austin Alexander  44 y.o. male  PRE-OPERATIVE DIAGNOSIS:  Infected abcess left hand  POST-OPERATIVE DIAGNOSIS:  Infected abcess left hand  PROCEDURE:  Procedure(s): IRRIGATION AND DEBRIDEMENT EXTREMITY (Left)  SURGEON:  Surgeon(s) and Role:    * Leanora Cover, MD - Primary  PHYSICIAN ASSISTANT:   ASSISTANTS: none   ANESTHESIA:   general  EBL:  Total I/O In: -  Out: 400 [Urine:400]  BLOOD ADMINISTERED:none  DRAINS: iodoform packing  LOCAL MEDICATIONS USED:  MARCAINE     SPECIMEN:  Source of Specimen:  left palm  DISPOSITION OF SPECIMEN:  micro  COUNTS:  YES  TOURNIQUET:  Left arm: 14 minutes at 250 mmHg  DICTATION: .Other Dictation: Dictation Number [id number]  PLAN OF CARE: Discharge to home after PACU  PATIENT DISPOSITION:  PACU - hemodynamically stable.   Delay start of Pharmacological VTE agent (>24hrs) due to surgical blood loss or risk of bleeding: no

## 2014-06-28 NOTE — H&P (Signed)
[facility] History and Physical  Patient: Austin Alexander  MRN: [id number]  DOB: [birth year]  DOS: the patient was seen and examined on 06/28/2014 PCP: Marlou Sa [name], MD  Chief Complaint: Left hand swelling  HPI: Austin Alexander is a 44 y.o. male with Past medical history of diabetes mellitus, hypertension, history of alcohol abuse, recurrent abscess and cellulitis, chronic systolic CHF with EF of 123456. The patient is presenting with complaints of one-week history of left arm swelling. He mentions the swelling has been present for last few months and has been intermittently worsening as well as improving. Patient was also recently being placed on antibiotic clindamycin for the same. He mentions over last one week he has noted that the swelling has been reoccurring and has been progressively worsening and has been having difficulty with moving his left hand. He also had some fever and chills. Denies any nausea vomiting or diarrhea or burning urination. Denies any focal deficit. He actually has lost weight. Denies any leg swelling currently. He mentions he is compliant with all his medications.  The patient is coming from home. And at his baseline independent for most of his ADL.  Review of Systems: as mentioned in the history of present illness.  A Comprehensive review of the other systems is negative.  Past Medical History  Diagnosis Date  . Bronchitis   . Croup   . Tobacco abuse   . Dizziness   . Abscess of perineum   . Heartburn   . Indigestion   . H/O folliculitis   . 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 Alcohol abuse   . Hypertension     variable in nature  . Hyperlipidemia   . Multiple excoriations     multifactorial in origin  . Mild obesity   . Dietary noncompliance     History of,  . Situational stress     secondary to financial issues  . Erectile dysfunction     Multifactorial. Improved with Cialis. New onset.  . Cystic acne     with acne  keloidosis  . Diabetes mellitus without complication     Type 2, uncontrolled with questionable improvement  . 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 EKG, abnormal     History of, with negative cardiac evaluation by history.  [name] COPD (chronic obstructive pulmonary disease)     moderate obstruction with low vital capacity  . Asthma     History of,  . CHF (congestive heart failure)    Past Surgical History  Procedure Laterality Date  . Incision and drainage perirectal abscess N/A 02/17/2014    Procedure: IRRIGATION AND DEBRIDEMENT PERIRECTAL  AND SCROTAL ABSCESS;  Surgeon: Coralie Keens, MD;  Location: [facility];  Service: General;  Laterality: N/A;   Social History:  reports that he has been smoking Cigarettes.  He has a 20 pack-year smoking history. He does not have any smokeless tobacco history on file. He reports that he drinks about 7.2 oz of alcohol per week. He reports that he does not use illicit drugs.  No Known Allergies  Family History  Problem Relation Age of Onset  . Diabetes type II Mother   . Hypertension Mother     Prior to Admission medications   Medication Sig Start Date End Date Taking? Authorizing Provider  aspirin EC 81 MG EC tablet Take 1 tablet (81 mg total) by mouth daily. 09/23/13  Yes Ejiroghene Arlyce Dice, MD  carvedilol (  COREG) 25 MG tablet Take 25 mg by mouth 2 (two) times daily with a meal.   Yes Historical Provider, MD  clindamycin (CLEOCIN) 300 MG capsule Take 1 capsule (300 mg total) by mouth 3 (three) times daily. 06/06/14  Yes Timmothy Euler, MD  digoxin (LANOXIN) 0.25 MG tablet Take 0.25 mg by mouth daily.   Yes Historical Provider, MD  furosemide (LASIX) 40 MG tablet Take 1 tablet (40 mg total) by mouth daily. Patient taking differently: Take 80 mg by mouth daily.  04/08/14  Yes Thurnell Lose, MD  glimepiride (AMARYL) 4 MG tablet Take 4 mg by mouth daily. 03/18/14  Yes Historical Provider, MD  HYDROcodone-acetaminophen  (NORCO) 5-325 MG per tablet Take 1 tablet by mouth every 6 (six) hours as needed for moderate pain. 06/06/14  Yes Timmothy Euler, MD  ivabradine (CORLANOR) 5 MG TABS tablet Take 5 mg by mouth daily.   Yes Historical Provider, MD  lisinopril (PRINIVIL,ZESTRIL) 20 MG tablet Take 40 mg by mouth daily.    Yes Historical Provider, MD  Multiple Vitamins-Minerals (CENTRUM ADULTS PO) Take 1 tablet by mouth daily.   Yes Historical Provider, MD  potassium chloride SA (K-DUR,KLOR-CON) 20 MEQ tablet Take 20 mEq by mouth 2 (two) times daily.   Yes Historical Provider, MD  simvastatin (ZOCOR) 40 MG tablet Take 1 tablet (40 mg total) by mouth daily. 09/23/13  Yes Ejiroghene Arlyce Dice, MD  spironolactone (ALDACTONE) 25 MG tablet Take 1 tablet (25 mg total) by mouth daily. 09/23/13  Yes Ejiroghene Arlyce Dice, MD  folic acid (FOLVITE) 1 MG tablet Take 1 tablet (1 mg total) by mouth daily. Patient not taking: Reported on 06/27/2014 04/08/14   Thurnell Lose, MD  metFORMIN (GLUCOPHAGE) 1000 MG tablet Take 1,000 mg by mouth 2 (two) times daily with a meal.    Historical Provider, MD  thiamine 100 MG tablet Take 1 tablet (100 mg total) by mouth daily. Patient not taking: Reported on 06/27/2014 04/08/14   Thurnell Lose, MD    Physical Exam: Filed Vitals:   06/28/14 0215 06/28/14 0230 06/28/14 0251 06/28/14 0300  BP: 123/84 117/80 135/99 137/93  Pulse: 127 125 125 124  Temp:      TempSrc:      Resp: 21 19 24 23   Height:      Weight:      SpO2: 95% 95% 97% 99%    General: Alert, Awake and Oriented to Time, Place and Person. Appear in mild distress Eyes: PERRL ENT: Oral Mucosa clear moist. Neck: Difficult to assess JVD Cardiovascular: S1 and S2 Present, np Murmur, Peripheral Pulses Present Respiratory: Bilateral Air entry equal and Decreased, Clear to Auscultation, nopCrackles, no wheezes Abdomen: Bowel Sound present, Soft and non tender Skin: no Rash Extremities: Large left thenar eminence swelling, no  Pedal edema, no calf tenderness Neurologic: Grossly no focal neuro deficit.  Labs on Admission:  CBC:  Recent Labs Lab 06/27/14 2112  WBC 10.3  NEUTROABS 7.5  HGB 12.7*  HCT 37.1*  MCV 80.8  PLT 361    CMP     Component Value Date/Time   NA 127* 06/28/2014 0057   K 4.2 06/28/2014 0057   CL 91* 06/28/2014 0057   CO2 26 06/28/2014 0057   GLUCOSE 357* 06/28/2014 0057   BUN 26* 06/28/2014 0057   CREATININE 1.33 06/28/2014 0057   CALCIUM 9.1 06/28/2014 0057   PROT 7.7 02/16/2014 0459   ALBUMIN 2.1* 02/16/2014 0459   AST 9 02/16/2014  0459   ALT 8 02/16/2014 0459   ALKPHOS 132* 02/16/2014 0459   BILITOT 0.5 02/16/2014 0459   GFRNONAA 63* 06/28/2014 0057   GFRAA 73* 06/28/2014 0057    No results for input(s): LIPASE, AMYLASE in the last 168 hours.  No results for input(s): CKTOTAL, CKMB, CKMBINDEX, TROPONINI in the last 168 hours. BNP (last 3 results)  Recent Labs  04/06/14 1252  BNP 1329.3*    ProBNP (last 3 results)  Recent Labs  09/22/13 0344  PROBNP 1845.0*     Radiological Exams on Admission: Dg Hand Complete Left  06/28/2014   CLINICAL DATA:  Piece of wood stuck in LEFT hand for 9 months, pain and swelling and recent infection.  EXAM: LEFT HAND - COMPLETE 3+ VIEW  COMPARISON:  LEFT hand radiograph June 06, 2014  FINDINGS: There is no evidence of fracture or dislocation. Corticated bony fragment at first carpometacarpal joint space may reflect remote injury. Widened radial ulnar joint space with blunted ulnar styloid most consistent with remote injury. There is no evidence of arthropathy or other focal bone abnormality. Persistent thenar soft tissue swelling without subcutaneous gas or radiopaque foreign bodies. Please note, would foreign body typically demonstrates air density.  IMPRESSION: Persistent thenar soft tissue swelling concerning for cellulitis without radiopaque foreign bodies identified. No destructive bony lesions.   Electronically Signed    By: Elon Alas   On: 06/28/2014 00:57    Assessment/Plan Principal Problem:   Abscess of left hand Active Problems:   HTN (hypertension)   Chronic systolic heart failure   DM type 2 (diabetes mellitus, type 2)   Hyperglycemia   1. Abscess of left hand The patient is presenting with complaints of swelling of his left hand. Most likely abscess. Hand surgery has been consulted and will be following up with the patient. Patient will remain nothing by mouth after breakfast. Continue with IV vancomycin.  2. Chronic systolic heart failure. Gentle IV hydration in view of his volume status. According oral diuretics  3. Hyperglycemia with diabetes uncontrolled. Patient presented with sugars in 600. Anion gap was normal. Patient was placed on insulin drip. We will transition to subcutaneous insulin with sliding scale once the sugar has been trending down less than 200.  Advance goals of care discussion: Full code   Consults: hAnd surgery  DVT Prophylaxis: subcutaneous Heparin Nutrition: Nothing by mouth after breakfast  Disposition: Admitted to inpatient in step-down it.  Author: Berle Mull, MD [facility] Pager: [phone] 06/28/2014,     If 7PM-7AM, please contact night-coverage [email or web address] Password [email or web address]

## 2014-06-28 NOTE — ED Notes (Signed)
Attempted to call MD in regards to pt's CBG and insulin due to pt being NPO for surgery. No return call from MD. Insulin held based on nursing judgement.

## 2014-06-29 LAB — GLUCOSE, CAPILLARY
GLUCOSE-CAPILLARY: 436 mg/dL — AB (ref 70–99)
Glucose-Capillary: 157 mg/dL — ABNORMAL HIGH (ref 70–99)
Glucose-Capillary: 318 mg/dL — ABNORMAL HIGH (ref 70–99)
Glucose-Capillary: 268 mg/dL — ABNORMAL HIGH (ref 70–99)
Glucose-Capillary: 141 mg/dL — ABNORMAL HIGH (ref 70–99)

## 2014-06-29 MED ORDER — INSULIN ASPART 100 UNIT/ML ~~LOC~~ SOLN
0.0000 [IU] | Freq: Three times a day (TID) | SUBCUTANEOUS | Status: DC
  Administered 2014-06-30 (×2): 4 [IU] via SUBCUTANEOUS
  Administered 2014-06-30: 11 [IU] via SUBCUTANEOUS

## 2014-06-29 MED ORDER — SODIUM CHLORIDE 0.9 % IV SOLN
INTRAVENOUS | Status: DC
  Administered 2014-06-29: 19:00:00 via INTRAVENOUS

## 2014-06-29 MED ORDER — INSULIN GLARGINE 100 UNIT/ML ~~LOC~~ SOLN
14.0000 [IU] | Freq: Every day | SUBCUTANEOUS | Status: DC
  Administered 2014-06-29: 14 [IU] via SUBCUTANEOUS
  Filled 2014-06-29 (×2): qty 0.14

## 2014-06-29 MED ORDER — VANCOMYCIN HCL IN DEXTROSE 750-5 MG/150ML-% IV SOLN
750.0000 mg | Freq: Two times a day (BID) | INTRAVENOUS | Status: DC
  Administered 2014-06-29 – 2014-06-30 (×3): 750 mg via INTRAVENOUS
  Filled 2014-06-29 (×4): qty 150

## 2014-06-29 MED ORDER — INSULIN ASPART 100 UNIT/ML ~~LOC~~ SOLN: 0.0000 [IU] | Freq: Every day | SUBCUTANEOUS | Status: DC

## 2014-06-29 MED ORDER — SODIUM CHLORIDE 0.9 % IV BOLUS (SEPSIS)
250.0000 mL | Freq: Once | INTRAVENOUS | Status: AC
  Administered 2014-06-29: 250 mL via INTRAVENOUS

## 2014-06-29 MED ORDER — OXYCODONE HCL 5 MG PO TABS
5.0000 mg | ORAL_TABLET | ORAL | Status: DC | PRN
  Administered 2014-06-29: 5 mg via ORAL
  Administered 2014-06-30 (×2): 10 mg via ORAL
  Filled 2014-06-29 (×2): qty 2
  Filled 2014-06-29: qty 1

## 2014-06-29 MED ORDER — INSULIN ASPART 100 UNIT/ML ~~LOC~~ SOLN
0.0000 [IU] | Freq: Three times a day (TID) | SUBCUTANEOUS | Status: AC
Start: 2014-06-29 — End: 2014-06-29
  Administered 2014-06-29: 7 [IU] via SUBCUTANEOUS

## 2014-06-29 NOTE — Progress Notes (Addendum)
Austin Alexander - Stepdown/ICU TEAM Progress Note  Austin Alexander [id number] DOB: 20-Sep-1970 DOA: 06/27/2014 PCP: Kevan Ny, MD  Admit HPI / Brief Narrative: 44 y.o. male with history of diabetes mellitus, hypertension, alcohol abuse, recurrent abscess and cellulitis, and chronic systolic CHF with EF of 123456 who presented with a one-week history of left arm swelling. He mentioned the swelling had been present intermittently for the last few months but had recently become more severe and persistent.    HPI/Subjective: Pt states his hand pain is well controlled.  He denies sob, n/v, cp, or abdom pain.    Assessment/Plan:  Abscess of left thenar eminence Per Hand Surgery  Chronic systolic heart failure - EF 25% Baseline wgt appears to be ~104kg - currently 105 - net negative Alexander.6L thus far - follow   Sinus tachycardia  Appears to be a chronic issue - is on ivabradine as outpt, whch was just resumed mid day today - follow for now   Uncontrolled DM2 CBG very poorly controlled - adjust tx and follow - must keep CBG <180 for wound healing   COPD Well compensated at this time   HTN BP currently reasonably controlled - follow trend   EtOH abuse No evidence of withdrawal at this time   Code Status: FULL Family Communication: no family present at time of exam Disposition Plan: SDU until HR better controlled  Consultants: Hand Surgery   Procedures: 3/18 - I&D L hand abscess  Antibiotics: Vancomycin 3/17 >  DVT prophylaxis: SQ heparin  Objective: Blood pressure 119/82, pulse 124, temperature 98.4 F (36.9 C), temperature source Oral, resp. rate 24, height 5\' 11"  (Alexander.803 m), weight 105.3 kg (232 lb 2.3 oz), SpO2 96 %.  Intake/Output Summary (Last 24 hours) at 06/29/14 1718 Last data filed at 06/29/14 1200  Gross per 24 hour  Intake 595.33 ml  Output   1950 ml  Net -1354.67 ml   Exam: General: No acute respiratory distress Lungs: Clear to auscultation bilaterally  without wheezes or crackles Cardiovascular: Regular rhythm bt tachy at 124 without murmur gallop or rub normal S1 and S2 Abdomen: Nontender, nondistended, soft, bowel sounds positive, no rebound, no ascites, no appreciable mass Extremities: No significant cyanosis, clubbing, or edema bilateral lower extremities - L hand dressed and dry    Data Reviewed: Basic Metabolic Panel:  Recent Labs Lab 06/27/14 2112 06/28/14 0057 06/28/14 0500  NA 123* 127* 131*  K 4.6 4.2 4.0  CL 86* 91* 96  CO2 30 26 26   GLUCOSE 506* 357* 81  BUN 31* 26* 23  CREATININE Alexander.67* Alexander.33 Alexander.13  CALCIUM 9.0 9.Alexander 9.Alexander    Liver Function Tests:  Recent Labs Lab 06/28/14 0500  AST 13  ALT 11  ALKPHOS 124*  BILITOT Alexander.3*  PROT 8.3  ALBUMIN 2.9*   Coags:  Recent Labs Lab 06/28/14 0500  INR Alexander.11   CBC:  Recent Labs Lab 06/27/14 2112 06/28/14 0500  WBC 10.3 9.9  NEUTROABS 7.5 6.9  HGB 12.7* 12.9*  HCT 37.Alexander* 37.5*  MCV 80.8 81.9  PLT 361 375   CBG:  Recent Labs Lab 06/28/14 1639 06/28/14 1720 06/28/14 2324 06/29/14 0454 06/29/14 1506  GLUCAP 226* 239* 305* 157* 436*   Studies:  Recent x-ray studies have been reviewed in detail by the Attending Physician  Scheduled Meds:  Scheduled Meds: . aspirin EC  81 mg Oral Daily  . carvedilol  25 mg Oral BID WC  . digoxin  0.25 mg Oral Daily  .  heparin  5,000 Units Subcutaneous 3 times per day  . insulin aspart  0-15 Units Subcutaneous Q6H  . insulin aspart  0-5 Units Subcutaneous QHS  . ivabradine  5 mg Oral Daily  . lisinopril  40 mg Oral Daily  . potassium chloride SA  20 mEq Oral BID  . simvastatin  40 mg Oral Daily  . sodium chloride  3 mL Intravenous Q12H  . vancomycin  750 mg Intravenous Q12H    Time spent on care of this patient: 35 mins   Austin Alexander , MD   [facility] Office  [phone] Pager - Text Page per Austin Alexander as per below:  On-Call/Text Page:      [email or web address]      password [email or web address]  If 7PM-7AM, please  contact night-coverage [email or web address] Password [email or web address] 06/29/2014, 5:18 PM   LOS: 2 days

## 2014-06-29 NOTE — Progress Notes (Signed)
ANTIBIOTIC CONSULT NOTE - FOLLOW UP  Pharmacy Consult for Vancomycin Indication: Wound infection  No Known Allergies  Patient Measurements: Height: 5\' 11"  (180.3 cm) Weight: 232 lb 2.3 oz (105.3 kg) IBW/kg (Calculated) : 75.3  Vital Signs: Temp: 98.1 F (36.7 C) (03/19 1200) Temp Source: Oral (03/19 1200) BP: 135/92 mmHg (03/19 1200) Pulse Rate: 125 (03/19 1200) Intake/Output from previous day: 03/18 0701 - 03/19 0700 In: 1195.3 [P.O.:200; I.V.:745.3; IV Piggyback:250] Out: 1450 [Urine:1450] Intake/Output from this shift: Total I/O In: -  Out: 900 [Urine:900]  Labs:  Recent Labs  06/27/14 2112 06/28/14 0057 06/28/14 0500  WBC 10.3  --  9.9  HGB 12.7*  --  12.9*  PLT 361  --  375  CREATININE 1.67* 1.33 1.13   Estimated Creatinine Clearance: 101.9 mL/min (by C-G formula based on Cr of 1.13). No results for input(s): VANCOTROUGH, VANCOPEAK, VANCORANDOM, GENTTROUGH, GENTPEAK, GENTRANDOM, TOBRATROUGH, TOBRAPEAK, TOBRARND, AMIKACINPEAK, AMIKACINTROU, AMIKACIN in the last 72 hours.   Microbiology: Recent Results (from the past 720 hour(s))  Culture, routine-abscess     Status: None   Collection Time: 06/06/14  3:56 PM  Result Value Ref Range Status   Specimen Description ABSCESS HAND LEFT  Final   Special Requests NONE  Final   Gram Stain   Final    ABUNDANT WBC PRESENT,BOTH PMN AND MONONUCLEAR NO SQUAMOUS EPITHELIAL CELLS SEEN FEW GRAM POSITIVE COCCI IN PAIRS Performed at [facility]    Culture   Final    MULTIPLE ORGANISMS PRESENT, NONE PREDOMINANT Note: NO STAPHYLOCOCCUS AUREUS ISOLATED NO GROUP A STREP (S.PYOGENES) ISOLATED Performed at [facility]    Report Status 06/10/2014 FINAL  Final  Blood culture (routine x 2)     Status: None (Preliminary result)   Collection Time: 06/27/14 10:26 PM  Result Value Ref Range Status   Specimen Description BLOOD LEFT ARM  Final   Special Requests BOTTLES DRAWN AEROBIC AND ANAEROBIC 10CC  Final   Culture   Final           BLOOD CULTURE RECEIVED NO GROWTH TO DATE CULTURE WILL BE HELD FOR 5 DAYS BEFORE ISSUING A FINAL NEGATIVE REPORT Performed at [facility]    Report Status PENDING  Incomplete  Blood culture (routine x 2)     Status: None (Preliminary result)   Collection Time: 06/27/14 10:27 PM  Result Value Ref Range Status   Specimen Description BLOOD RIGHT HAND  Final   Special Requests BOTTLES DRAWN AEROBIC AND ANAEROBIC 4CC  Final   Culture   Final           BLOOD CULTURE RECEIVED NO GROWTH TO DATE CULTURE WILL BE HELD FOR 5 DAYS BEFORE ISSUING A FINAL NEGATIVE REPORT Performed at [facility]    Report Status PENDING  Incomplete    Anti-infectives    Start     Dose/Rate Route Frequency Ordered Stop   06/29/14 1400  vancomycin (VANCOCIN) IVPB 750 mg/150 ml premix     750 mg 150 mL/hr over 60 Minutes Intravenous Every 12 hours 06/29/14 1330     06/28/14 2200  vancomycin (VANCOCIN) 1,250 mg in sodium chloride 0.9 % 250 mL IVPB  Status:  Discontinued     1,250 mg 166.7 mL/hr over 90 Minutes Intravenous Every 24 hours 06/27/14 2204 06/29/14 1330   06/27/14 2145  vancomycin (VANCOCIN) 1,750 mg in sodium chloride 0.9 % 500 mL IVPB     1,750 mg 250 mL/hr over 120 Minutes Intravenous  Once  06/27/14 2141 06/28/14 0120      Assessment: 44yo male presents with L hand swelling and pain from which he had the site drained Tues by PCP. Pharmacy is consulted to dose vancomycin for wound infection. Day # 3 of vancomycin for wound infection. Had irrigation and debridement on 3/18. SCr improved to 1.13 today. Normalized CrCl is 30ml/min.  Goal of Therapy:  Vancomycin trough level 15-20 mcg/ml  Plan:  Change vancomycin to 750mg  IV Q12 Monitor renal function, clinical picture, VT at Css F/U abx LOT, deescalation, ivabradine dose? (bid?) Note due 3/22  Taner Rzepka J 06/29/2014,1:30 PM

## 2014-06-29 NOTE — Op Note (Signed)
NAME:  RAYSON, KIRSCHMAN NO.:  [id number]  MEDICAL RECORD NO.:  [id number]  LOCATION:                                 FACILITY:  PHYSICIAN:  Leanora Cover, MD        DATE OF BIRTH:  12-29-1970  DATE OF PROCEDURE:  06/28/2014 DATE OF DISCHARGE:                              OPERATIVE REPORT   PREOPERATIVE DIAGNOSIS:  Left palm abscess.  POSTOPERATIVE DIAGNOSIS:  Left palm abscess.  PROCEDURE:  Incision and drainage, left palm abscess.  SURGEON:  Leanora Cover, MD.  ASSISTANT:  None.  ANESTHESIA:  General.  IV FLUIDS:  Per anesthesia flow sheet.  ESTIMATED BLOOD LOSS:  Minimal.  COMPLICATIONS:  None.  SPECIMENS:  Cultures to micro.  TOURNIQUET TIME:  14 minutes.  DISPOSITION:  Stable to PACU.  INDICATIONS:  Mr. Cuartas is a 44 year old right-hand dominant male who states couple months ago he got a splinter into his left hand.  He underwent an I and D in urgent care facility and follow up with his primary care physician.  The wound healed over and he had been doing well but over the past week has noted increased pain and swelling in the thenar eminence of his left thumb.  He presented to the emergency department last week.  He was admitted by the hospitalist for high blood sugars.  He has prior recommended going to the operating room for incision and drainage of the abscess.  Risks, benefits, and alternatives of surgery were discussed including risk of blood loss, infection; damage to nerves, vessels, tendons, ligaments, bone; failure of surgery; need for additional surgery, complications with wound healing, continued pain, continued infection, need for irrigation and debridement.  He voiced understanding these risks and elected to proceed.  OPERATIVE COURSE:  After being identified preoperatively by myself, the patient and I agreed upon procedure and site of procedure.  Surgical site was marked.  The risks, benefits, and alternatives of surgery  were reviewed and wished to proceed.  Surgical consent had been signed.  He was transferred to the operating room and placed on the operating table in supine position with the left upper extremity on arm board.  General anesthesia was induced by anesthesiologist.  The left upper extremity was prepped and draped in normal sterile orthopedic fashion.  Surgical pause was performed between surgeons, anesthesia, operating staff, and all were in agreement as to the patient, procedure, site of procedure. Tourniquet at the proximal aspect of the extremity was inflated to 250 mmHg after exsanguination of the hand by gravity in the forearm by Esmarch bandage.  The previous wound was spread.  There was gross purulence.  The wound was extended both proximally and distally and the previous wound edges excised with the knife.  There was significant amount of gross purulence.  Approximately 2.5 to 3 cm piece of wood was found within the wound and removed.  The abscess cavity was defined.  It was debrided with a sponge and a curette.  All purulence was removed. Any devitalized tissue was removed.  It was then copiously irrigated with a 1000 mL of sterile saline by cysto tubing.  The wound was then packed  open with quarter-inch iodoform gauze.  It was injected with 9 mL of 0.25% plain Marcaine to aid in postoperative analgesia.  It was dressed with sterile 4x4s and wrapped with a Kerlix bandage.  A thumb spica splint was placed and wrapped with Kerlix and Ace bandage lightly. Tourniquet was deflated at 14 minutes.  Fingertips were pink with brisk capillary refill after deflation of tourniquet.  The operative drapes were broken down.  The patient was awoken from anesthesia safely.  He was transferred back to stretcher and taken to PACU in stable condition. He will be continued on IV antibiotics and returned to the floor under his primary team.     Leanora Cover, MD     KK/MEDQ  D:  06/28/2014  T:   06/29/2014  Job:  [id number]

## 2014-06-30 LAB — GLUCOSE, CAPILLARY
Glucose-Capillary: 177 mg/dL — ABNORMAL HIGH (ref 70–99)
Glucose-Capillary: 191 mg/dL — ABNORMAL HIGH (ref 70–99)
Glucose-Capillary: 276 mg/dL — ABNORMAL HIGH (ref 70–99)

## 2014-06-30 MED ORDER — SULFAMETHOXAZOLE-TRIMETHOPRIM 800-160 MG PO TABS: 1.0000 | ORAL_TABLET | Freq: Two times a day (BID) | ORAL | Status: DC

## 2014-06-30 MED ORDER — HYDROCODONE-ACETAMINOPHEN 5-325 MG PO TABS: 1.0000 | ORAL_TABLET | Freq: Four times a day (QID) | ORAL | Status: DC | PRN

## 2014-06-30 NOTE — Discharge Instructions (Signed)
Keep any DRESSINGS ON, CLEAN AND DRY. Do not remove them until instructed to do so. There may be some bloody drainage on the dressings. If you have excessive bleeding or the bandages are too tight, call the office immediately.  During bathing enclose your dressing in a plastic bag, seal the bag to your skin beyond the dressing and place a small towel inside the seal to catch any water that breaks through.  PLEASE AVOID THE USE OF ALCOHOLIC BEVERAGES for the first 24 hours (it dilates blood vessels and can cause unwanted bleeding) and as long as pain medications are being used (dangerous combination).  Call your surgeon at any time for the following events: a. Circulation Problems = the fingers present color changes to blue or white with no pink b. Excessive bleeding = bright red blood keeps coming out of the dressing c. Loss of feeling in your hand or finger tips d. Tight Dressing = Dressings can become tight from swelling underneath. If this happens, elevate the limb for 30 minutes. If no relief, call our office immediately e. Problems with your surgery

## 2014-06-30 NOTE — Discharge Planning (Signed)
DISCHARGE SUMMARY  Austin Alexander  MR#: [id number]  DOB:[birth year]  Date of Admission: 06/27/2014 Date of Discharge: 06/30/2014  Attending Physician:MCCLUNG,JEFFREY T  Patient's [id number], [name], MD  Consults:  Hand Surgery   Disposition: d/c home   Follow-up Appts:     Follow-up Information    Follow up with Tennis Must, MD In 1 week.   Specialty:  Orthopedic Surgery   Contact information:   [place] [address] [phone]       Follow up with Marlou Sa, [name], MD. Schedule an appointment as soon as possible for a visit in 5 days.   Specialty:  Internal Medicine   Contact information:   [address] [place] [address] [phone]      Tests Needing Follow-up: -CBG check -f/u of hand wound cultures obtained in OR   Discharge Diagnoses: Abscess of left thenar eminence Chronic systolic heart failure - EF 25% Sinus tachycardia  Uncontrolled DM2 COPD HTN EtOH abuse  Initial presentation: 44 y.o. male with history of diabetes mellitus, hypertension, alcohol abuse, recurrent abscess and cellulitis, and chronic systolic CHF with EF of [id number] who presented with a one-week history of left arm swelling. He mentioned the swelling had been present intermittently for the last few months but had recently become more severe and persistent.   Hospital Course:  Abscess of left thenar eminence Per Hand Surgery, to f/u at the office in 1 week - tx w/  IV vanc during stay - operative cx w/o results thus far, but GS noted few gram+ cocci - Staph most commonly implicated in this situration - transition to oral septra DS (local antibiogram notes excellent coverage for both MSSA and MRSA) - pt advised to leave dressing intact on L arm, to keep the arm elevated, and to f/u w/ Dr. Fredna Dow as described above   Chronic systolic heart failure - EF 25% Baseline wgt appeared to be ~104kg - wgt 105.3 at time of d/c, w/ no evidence of volume overload - net negative 4.3L  during hospital stay - resume usual home meds   Sinus tachycardia  Appears to be a chronic issue - resolved w/ resumption of ivabradine and w/ gentle fluid resuscitation   Uncontrolled DM2 CBG control much improved -resumed usual home meds at time of d/c - advised pt to follow strict cardiac and DM diet    COPD Well compensated th/o his stay   HTN BP reasonably controlled th/o his stay   EtOH abuse No evidence of withdrawal during hospital stay      Medication List    STOP taking these medications        clindamycin 300 MG capsule  Commonly known as:  CLEOCIN      TAKE these medications        aspirin 81 MG EC tablet  Take 1 tablet (81 mg total) by mouth daily.     carvedilol 25 MG tablet  Commonly known as:  COREG  Take 25 mg by mouth 2 (two) times daily with a meal.     CENTRUM ADULTS PO  Take 1 tablet by mouth daily.     digoxin 0.25 MG tablet  Commonly known as:  LANOXIN  Take 0.25 mg by mouth daily.     folic acid 1 MG tablet  Commonly known as:  FOLVITE  Take 1 tablet (1 mg total) by mouth daily.     furosemide 40 MG tablet  Commonly known as:  LASIX  Take 1 tablet (40 mg  total) by mouth daily.     glimepiride 4 MG tablet  Commonly known as:  AMARYL  Take 4 mg by mouth daily.     HYDROcodone-acetaminophen 5-325 MG per tablet  Commonly known as:  NORCO  Take 1 tablet by mouth every 6 (six) hours as needed for moderate pain.     ivabradine 5 MG Tabs tablet  Commonly known as:  CORLANOR  Take 5 mg by mouth daily.     lisinopril 20 MG tablet  Commonly known as:  PRINIVIL,ZESTRIL  Take 40 mg by mouth daily.     metFORMIN 1000 MG tablet  Commonly known as:  GLUCOPHAGE  Take 1,000 mg by mouth 2 (two) times daily with a meal.     potassium chloride SA 20 MEQ tablet  Commonly known as:  K-DUR,KLOR-CON  Take 20 mEq by mouth 2 (two) times daily.     simvastatin 40 MG tablet  Commonly known as:  ZOCOR  Take 1 tablet (40 mg total) by mouth daily.       spironolactone 25 MG tablet  Commonly known as:  ALDACTONE  Take 1 tablet (25 mg total) by mouth daily.     sulfamethoxazole-trimethoprim 800-160 MG per tablet  Commonly known as:  BACTRIM DS,SEPTRA DS  Take 1 tablet by mouth every 12 (twelve) hours.  Start taking on:  07/01/2014     thiamine 100 MG tablet  Take 1 tablet (100 mg total) by mouth daily.       Day of Discharge BP 130/89 mmHg  Pulse 96  Temp(Src) 97.8 F (36.6 C) (Oral)  Resp 16  Ht 5\' 11"  (1.803 m)  Wt 105.3 kg (232 lb 2.3 oz)  BMI 32.39 kg/m2  SpO2 99%  Physical Exam: General: No acute respiratory distress Lungs: Clear to auscultation bilaterally without wheezes or crackles Cardiovascular: Regular rate and rhythm without murmur gallop or rub normal S1 and S2 Abdomen: Nontender, nondistended, soft, bowel sounds positive, no rebound, no ascites, no appreciable mass Extremities: No significant cyanosis, clubbing, or edema bilateral lower extremities - L arm dressed in surgical dressings/ace w/o evidence of bleeding or purulent d/c through bandages  Basic Metabolic Panel:  Recent Labs Lab 06/27/14 2112 06/28/14 0057 06/28/14 0500  NA 123* 127* 131*  K 4.6 4.2 4.0  CL 86* 91* 96  CO2 30 26 26   GLUCOSE 506* 357* 81  BUN 31* 26* 23  CREATININE 1.67* 1.33 1.13  CALCIUM 9.0 9.1 9.1    Liver Function Tests:  Recent Labs Lab 06/28/14 0500  AST 13  ALT 11  ALKPHOS 124*  BILITOT 1.3*  PROT 8.3  ALBUMIN 2.9*   Coags:  Recent Labs Lab 06/28/14 0500  INR 1.11   CBC:  Recent Labs Lab 06/27/14 2112 06/28/14 0500  WBC 10.3 9.9  NEUTROABS 7.5 6.9  HGB 12.7* 12.9*  HCT 37.1* 37.5*  MCV 80.8 81.9  PLT 361 375    CBG:  Recent Labs Lab 06/29/14 1749 06/29/14 2011 06/29/14 2204 06/30/14 0747 06/30/14 1326  GLUCAP 318* 268* 141* 177* 191*    Recent Results (from the past 240 hour(s))  Blood culture (routine x 2)     Status: None (Preliminary result)   Collection Time: 06/27/14  10:26 PM  Result Value Ref Range Status   Specimen Description BLOOD LEFT ARM  Final   Special Requests BOTTLES DRAWN AEROBIC AND ANAEROBIC 10CC  Final   Culture   Final  BLOOD CULTURE RECEIVED NO GROWTH TO DATE CULTURE WILL BE HELD FOR 5 DAYS BEFORE ISSUING A FINAL NEGATIVE REPORT Performed at [facility]    Report Status PENDING  Incomplete  Blood culture (routine x 2)     Status: None (Preliminary result)   Collection Time: 06/27/14 10:27 PM  Result Value Ref Range Status   Specimen Description BLOOD RIGHT HAND  Final   Special Requests BOTTLES DRAWN AEROBIC AND ANAEROBIC 4CC  Final   Culture   Final           BLOOD CULTURE RECEIVED NO GROWTH TO DATE CULTURE WILL BE HELD FOR 5 DAYS BEFORE ISSUING A FINAL NEGATIVE REPORT Performed at [facility]    Report Status PENDING  Incomplete  Anaerobic culture     Status: None (Preliminary result)   Collection Time: 06/28/14  4:01 PM  Result Value Ref Range Status   Specimen Description ABSCESS LEFT HAND  Final   Special Requests POF VANCOMYCIN  Final   Gram Stain   Final    NO WBC SEEN NO SQUAMOUS EPITHELIAL CELLS SEEN FEW GRAM POSITIVE COCCI IN PAIRS Performed at [facility]    Culture   Final    NO ANAEROBES ISOLATED; CULTURE IN PROGRESS FOR 5 DAYS Performed at [facility]    Report Status PENDING  Incomplete  Culture, routine-abscess     Status: None (Preliminary result)   Collection Time: 06/28/14  4:01 PM  Result Value Ref Range Status   Specimen Description ABSCESS LEFT HAND  Final   Special Requests POF VANCOMYCIN  Final   Gram Stain   Final    NO WBC SEEN NO SQUAMOUS EPITHELIAL CELLS SEEN FEW GRAM POSITIVE COCCI IN PAIRS Performed at [facility]    Culture   Final    Culture reincubated for better growth Performed at [facility]    Report Status PENDING  Incomplete      Time spent in discharge (includes decision making & examination of pt): >35   minutes  06/30/2014, 5:04 PM   Cherene Altes, MD [facility] Office  [phone] Pager [phone]  On-Call/Text Page:      [email or web address]      password [facility]

## 2014-07-01 ENCOUNTER — Encounter (HOSPITAL_COMMUNITY): Payer: Self-pay | Admitting: Orthopedic Surgery

## 2014-07-01 NOTE — Progress Notes (Signed)
Utilization Review Completed.  

## 2014-07-02 LAB — CULTURE, ROUTINE-ABSCESS: Gram Stain: NONE SEEN

## 2014-07-03 LAB — ANAEROBIC CULTURE: GRAM STAIN: NONE SEEN

## 2014-07-03 NOTE — Discharge Summary (Signed)
DISCHARGE SUMMARY  Austin Alexander  MR#: [id number]  DOB:1970/05/16  Date of Admission: 06/27/2014 Date of Discharge: 06/30/2014  Attending Physician:Austin Alexander  Patient's QJ:[id number], [name], Alexander  Consults: Hand Surgery   Disposition: d/c home   Follow-up Appts:     Follow-up Information    Follow up with Austin Alexander In 1 week.   Specialty: Orthopedic Surgery   Contact information:   [place] [address] [phone]       Follow up with Austin Alexander. Schedule an appointment as soon as possible for a visit in 5 days.   Specialty: Internal Medicine   Contact information:   [address] [place] [address] [phone]      Tests Needing Follow-up: -CBG check -f/u of hand wound cultures obtained in OR   Discharge Diagnoses: Abscess of left thenar eminence Chronic systolic heart failure - EF 25% Sinus tachycardia  Uncontrolled DM2 COPD HTN EtOH abuse  Initial presentation: 43 y.o. male with history of diabetes mellitus, hypertension, alcohol abuse, recurrent abscess and cellulitis, and chronic systolic CHF with EF of 123456 who presented with a one-week history of left arm swelling. He mentioned the swelling had been present intermittently for the last few months but had recently become more severe and persistent.   Hospital Course:  Abscess of left thenar eminence Per Hand Surgery, to f/u at the office in 1 week - tx w/ IV vanc during stay - operative cx w/o results thus far, but GS noted few gram+ cocci - Staph most commonly implicated in this situration - transition to oral septra DS (local antibiogram notes excellent coverage for both MSSA and MRSA) - pt advised to leave dressing intact on L arm, to keep the arm elevated, and to f/u w/ Austin Alexander as described above   Chronic systolic heart failure - EF 25% Baseline wgt appeared to be ~104kg - wgt 105.3 at time of d/c, w/ no evidence of volume  overload - net negative 4.3L during hospital stay - resume usual home meds   Sinus tachycardia  Appears to be a chronic issue - resolved w/ resumption of ivabradine and w/ gentle fluid resuscitation   Uncontrolled DM2 CBG control much improved -resumed usual home meds at time of d/c - advised pt to follow strict cardiac and DM diet   COPD Well compensated th/o his stay   HTN BP reasonably controlled th/o his stay   EtOH abuse No evidence of withdrawal during hospital stay     Medication List    STOP taking these medications       clindamycin 300 MG capsule  Commonly known as: CLEOCIN      TAKE these medications       aspirin 81 MG EC tablet  Take 1 tablet (81 mg total) by mouth daily.     carvedilol 25 MG tablet  Commonly known as: COREG  Take 25 mg by mouth 2 (two) times daily with a meal.     CENTRUM ADULTS PO  Take 1 tablet by mouth daily.     digoxin 0.25 MG tablet  Commonly known as: LANOXIN  Take 0.25 mg by mouth daily.     folic acid 1 MG tablet  Commonly known as: FOLVITE  Take 1 tablet (1 mg total) by mouth daily.     furosemide 40 MG tablet  Commonly known as: LASIX  Take 1 tablet (40 mg total) by mouth daily.     glimepiride 4 MG tablet  Commonly known as: AMARYL  Take 4 mg by mouth daily.     HYDROcodone-acetaminophen 5-325 MG per tablet  Commonly known as: NORCO  Take 1 tablet by mouth every 6 (six) hours as needed for moderate pain.     ivabradine 5 MG Tabs tablet  Commonly known as: CORLANOR  Take 5 mg by mouth daily.     lisinopril 20 MG tablet  Commonly known as: PRINIVIL,ZESTRIL  Take 40 mg by mouth daily.     metFORMIN 1000 MG tablet  Commonly known as: GLUCOPHAGE  Take 1,000 mg by mouth 2 (two) times daily with a meal.     potassium chloride SA 20 MEQ tablet  Commonly known as: K-DUR,KLOR-CON  Take 20 mEq by mouth 2 (two) times daily.      simvastatin 40 MG tablet  Commonly known as: ZOCOR  Take 1 tablet (40 mg total) by mouth daily.     spironolactone 25 MG tablet  Commonly known as: ALDACTONE  Take 1 tablet (25 mg total) by mouth daily.     sulfamethoxazole-trimethoprim 800-160 MG per tablet  Commonly known as: BACTRIM DS,SEPTRA DS  Take 1 tablet by mouth every 12 (twelve) hours.  Start taking on: 07/01/2014     thiamine 100 MG tablet  Take 1 tablet (100 mg total) by mouth daily.       Day of Discharge BP 130/89 mmHg  Pulse 96  Temp(Src) 97.8 F (36.6 C) (Oral)  Resp 16  Ht 5\' 11"  (1.803 m)  Wt 105.3 kg (232 lb 2.3 oz)  BMI 32.39 kg/m2  SpO2 99%  Physical Exam: General: No acute respiratory distress Lungs: Clear to auscultation bilaterally without wheezes or crackles Cardiovascular: Regular rate and rhythm without murmur gallop or rub normal S1 and S2 Abdomen: Nontender, nondistended, soft, bowel sounds positive, no rebound, no ascites, no appreciable mass Extremities: No significant cyanosis, clubbing, or edema bilateral lower extremities - L arm dressed in surgical dressings/ace w/o evidence of bleeding or purulent d/c through bandages  Basic Metabolic Panel:  Last Labs      Recent Labs Lab 06/27/14 2112 06/28/14 0057 06/28/14 0500  NA 123* 127* 131*  K 4.6 4.2 4.0  CL 86* 91* 96  CO2 30 26 26   GLUCOSE 506* 357* 81  BUN 31* 26* 23  CREATININE 1.67* 1.33 1.13  CALCIUM 9.0 9.1 9.1      Liver Function Tests:  Last Labs      Recent Labs Lab 06/28/14 0500  AST 13  ALT 11  ALKPHOS 124*  BILITOT 1.3*  PROT 8.3  ALBUMIN 2.9*     Coags:  Last Labs      Recent Labs Lab 06/28/14 0500  INR 1.11     CBC:  Last Labs      Recent Labs Lab 06/27/14 2112 06/28/14 0500  WBC 10.3 9.9  NEUTROABS 7.5 6.9  HGB 12.7* 12.9*  HCT 37.1* 37.5*  MCV 80.8 81.9  PLT 361 375      CBG:   Last Labs      Recent Labs Lab 06/29/14 1749 06/29/14 2011 06/29/14 2204 06/30/14 0747 06/30/14 1326  GLUCAP 318* 268* 141* 177* 191*      Recent Results (from the past 240 hour(s))  Blood culture (routine x 2) Status: None (Preliminary result)   Collection Time: 06/27/14 10:26 PM  Result Value Ref Range Status   Specimen Description BLOOD LEFT ARM  Final   Special Requests BOTTLES DRAWN AEROBIC AND ANAEROBIC 10CC  Final   Culture   Final     BLOOD CULTURE RECEIVED NO GROWTH TO DATE CULTURE WILL BE HELD FOR 5 DAYS BEFORE ISSUING A FINAL NEGATIVE REPORT Performed at [facility]    Report Status PENDING  Incomplete  Blood culture (routine x 2) Status: None (Preliminary result)   Collection Time: 06/27/14 10:27 PM  Result Value Ref Range Status   Specimen Description BLOOD RIGHT HAND  Final   Special Requests BOTTLES DRAWN AEROBIC AND ANAEROBIC 4CC  Final   Culture   Final     BLOOD CULTURE RECEIVED NO GROWTH TO DATE CULTURE WILL BE HELD FOR 5 DAYS BEFORE ISSUING A FINAL NEGATIVE REPORT Performed at [facility]    Report Status PENDING  Incomplete  Anaerobic culture Status: None (Preliminary result)   Collection Time: 06/28/14 4:01 PM  Result Value Ref Range Status   Specimen Description ABSCESS LEFT HAND  Final   Special Requests POF VANCOMYCIN  Final   Gram Stain   Final    NO WBC SEEN NO SQUAMOUS EPITHELIAL CELLS SEEN FEW GRAM POSITIVE COCCI IN PAIRS Performed at [facility]    Culture   Final    NO ANAEROBES ISOLATED; CULTURE IN PROGRESS FOR 5 DAYS Performed at [facility]    Report Status PENDING  Incomplete  Culture, routine-abscess Status: None (Preliminary result)   Collection Time: 06/28/14 4:01 PM  Result Value Ref Range Status   Specimen Description ABSCESS LEFT HAND   Final   Special Requests POF VANCOMYCIN  Final   Gram Stain   Final    NO WBC SEEN NO SQUAMOUS EPITHELIAL CELLS SEEN FEW GRAM POSITIVE COCCI IN PAIRS Performed at [facility]    Culture   Final    Culture reincubated for better growth Performed at [facility]    Report Status PENDING  Incomplete      Time spent in discharge (includes decision making & examination of pt): >35 minutes  06/30/2014, 5:04 PM   Cherene Altes, Alexander [facility] Office [phone] Pager [phone]  On-Call/Text Page:  [email or web address]  password [facility]

## 2014-07-04 LAB — CULTURE, BLOOD (ROUTINE X 2)
CULTURE: NO GROWTH
Culture: NO GROWTH

## 2014-08-09 ENCOUNTER — Encounter (HOSPITAL_COMMUNITY): Payer: Self-pay | Admitting: Emergency Medicine

## 2014-08-09 DIAGNOSIS — Z599 Problem related to housing and economic circumstances, unspecified: Secondary | ICD-10-CM

## 2014-08-09 DIAGNOSIS — E875 Hyperkalemia: Secondary | ICD-10-CM | POA: Diagnosis not present

## 2014-08-09 DIAGNOSIS — E1165 Type 2 diabetes mellitus with hyperglycemia: Secondary | ICD-10-CM | POA: Diagnosis not present

## 2014-08-09 DIAGNOSIS — E871 Hypo-osmolality and hyponatremia: Secondary | ICD-10-CM | POA: Diagnosis present

## 2014-08-09 DIAGNOSIS — E785 Hyperlipidemia, unspecified: Secondary | ICD-10-CM | POA: Diagnosis present

## 2014-08-09 DIAGNOSIS — J449 Chronic obstructive pulmonary disease, unspecified: Secondary | ICD-10-CM | POA: Diagnosis present

## 2014-08-09 DIAGNOSIS — Z7982 Long term (current) use of aspirin: Secondary | ICD-10-CM

## 2014-08-09 DIAGNOSIS — R42 Dizziness and giddiness: Secondary | ICD-10-CM | POA: Diagnosis not present

## 2014-08-09 DIAGNOSIS — I5022 Chronic systolic (congestive) heart failure: Secondary | ICD-10-CM | POA: Diagnosis present

## 2014-08-09 DIAGNOSIS — R630 Anorexia: Secondary | ICD-10-CM | POA: Diagnosis present

## 2014-08-09 DIAGNOSIS — J45909 Unspecified asthma, uncomplicated: Secondary | ICD-10-CM | POA: Diagnosis present

## 2014-08-09 DIAGNOSIS — Z9119 Patient's noncompliance with other medical treatment and regimen: Secondary | ICD-10-CM | POA: Diagnosis present

## 2014-08-09 DIAGNOSIS — T501X5A Adverse effect of loop [high-ceiling] diuretics, initial encounter: Secondary | ICD-10-CM | POA: Diagnosis present

## 2014-08-09 DIAGNOSIS — Z6829 Body mass index (BMI) 29.0-29.9, adult: Secondary | ICD-10-CM

## 2014-08-09 DIAGNOSIS — I10 Essential (primary) hypertension: Secondary | ICD-10-CM | POA: Diagnosis present

## 2014-08-09 DIAGNOSIS — N179 Acute kidney failure, unspecified: Secondary | ICD-10-CM | POA: Diagnosis present

## 2014-08-09 DIAGNOSIS — E669 Obesity, unspecified: Secondary | ICD-10-CM | POA: Diagnosis present

## 2014-08-09 DIAGNOSIS — F17210 Nicotine dependence, cigarettes, uncomplicated: Secondary | ICD-10-CM | POA: Diagnosis present

## 2014-08-09 LAB — CBG MONITORING, ED: Glucose-Capillary: 370 mg/dL — ABNORMAL HIGH (ref 70–99)

## 2014-08-09 NOTE — ED Notes (Signed)
Pt. reports elevated blood sugar at home this afternoon ( 400+) , respirations unlabored , denies nausea or vomitting , denies fever or chills.

## 2014-08-10 ENCOUNTER — Inpatient Hospital Stay (HOSPITAL_COMMUNITY)
Admission: EM | Admit: 2014-08-10 | Discharge: 2014-08-12 | DRG: 638 | Disposition: A | Discharge status: Home / Self Care | Payer: 59 | Attending: Family Medicine | Admitting: Family Medicine

## 2014-08-10 ENCOUNTER — Inpatient Hospital Stay (HOSPITAL_COMMUNITY): Payer: 59

## 2014-08-10 DIAGNOSIS — E785 Hyperlipidemia, unspecified: Secondary | ICD-10-CM | POA: Diagnosis present

## 2014-08-10 DIAGNOSIS — I10 Essential (primary) hypertension: Secondary | ICD-10-CM

## 2014-08-10 DIAGNOSIS — E118 Type 2 diabetes mellitus with unspecified complications: Secondary | ICD-10-CM | POA: Diagnosis not present

## 2014-08-10 DIAGNOSIS — R42 Dizziness and giddiness: Secondary | ICD-10-CM | POA: Diagnosis present

## 2014-08-10 DIAGNOSIS — J45909 Unspecified asthma, uncomplicated: Secondary | ICD-10-CM | POA: Diagnosis present

## 2014-08-10 DIAGNOSIS — R739 Hyperglycemia, unspecified: Secondary | ICD-10-CM | POA: Diagnosis present

## 2014-08-10 DIAGNOSIS — J449 Chronic obstructive pulmonary disease, unspecified: Secondary | ICD-10-CM | POA: Diagnosis present

## 2014-08-10 DIAGNOSIS — E871 Hypo-osmolality and hyponatremia: Secondary | ICD-10-CM | POA: Diagnosis present

## 2014-08-10 DIAGNOSIS — Z7982 Long term (current) use of aspirin: Secondary | ICD-10-CM | POA: Diagnosis not present

## 2014-08-10 DIAGNOSIS — I509 Heart failure, unspecified: Secondary | ICD-10-CM | POA: Diagnosis not present

## 2014-08-10 DIAGNOSIS — N179 Acute kidney failure, unspecified: Secondary | ICD-10-CM | POA: Diagnosis present

## 2014-08-10 DIAGNOSIS — Z599 Problem related to housing and economic circumstances, unspecified: Secondary | ICD-10-CM | POA: Diagnosis not present

## 2014-08-10 DIAGNOSIS — F17210 Nicotine dependence, cigarettes, uncomplicated: Secondary | ICD-10-CM | POA: Diagnosis present

## 2014-08-10 DIAGNOSIS — Z9119 Patient's noncompliance with other medical treatment and regimen: Secondary | ICD-10-CM | POA: Diagnosis present

## 2014-08-10 DIAGNOSIS — I5022 Chronic systolic (congestive) heart failure: Secondary | ICD-10-CM | POA: Diagnosis present

## 2014-08-10 DIAGNOSIS — Z6829 Body mass index (BMI) 29.0-29.9, adult: Secondary | ICD-10-CM | POA: Diagnosis not present

## 2014-08-10 DIAGNOSIS — E669 Obesity, unspecified: Secondary | ICD-10-CM | POA: Diagnosis present

## 2014-08-10 DIAGNOSIS — T501X5A Adverse effect of loop [high-ceiling] diuretics, initial encounter: Secondary | ICD-10-CM | POA: Diagnosis present

## 2014-08-10 DIAGNOSIS — E1165 Type 2 diabetes mellitus with hyperglycemia: Secondary | ICD-10-CM | POA: Diagnosis present

## 2014-08-10 DIAGNOSIS — E875 Hyperkalemia: Secondary | ICD-10-CM | POA: Diagnosis not present

## 2014-08-10 DIAGNOSIS — R630 Anorexia: Secondary | ICD-10-CM | POA: Diagnosis present

## 2014-08-10 LAB — CBC
HEMATOCRIT: 35.5 % — AB (ref 39.0–52.0)
HEMOGLOBIN: 12.5 g/dL — AB (ref 13.0–17.0)
MCH: 28.3 pg (ref 26.0–34.0)
MCHC: 35.2 g/dL (ref 30.0–36.0)
MCV: 80.3 fL (ref 78.0–100.0)
Platelets: 320 10*3/uL (ref 150–400)
RBC: 4.42 MIL/uL (ref 4.22–5.81)
RDW: 16 % — AB (ref 11.5–15.5)
WBC: 9 10*3/uL (ref 4.0–10.5)

## 2014-08-10 LAB — BASIC METABOLIC PANEL
ANION GAP: 11 (ref 5–15)
ANION GAP: 11 (ref 5–15)
Anion gap: 11 (ref 5–15)
BUN: 34 mg/dL — ABNORMAL HIGH (ref 6–23)
BUN: 37 mg/dL — ABNORMAL HIGH (ref 6–23)
BUN: 38 mg/dL — AB (ref 6–23)
CALCIUM: 9.3 mg/dL (ref 8.4–10.5)
CALCIUM: 9.1 mg/dL (ref 8.4–10.5)
CHLORIDE: 88 mmol/L — AB (ref 96–112)
CO2: 25 mmol/L (ref 19–32)
CO2: 23 mmol/L (ref 19–32)
CO2: 23 mmol/L (ref 19–32)
CREATININE: 1.55 mg/dL — AB (ref 0.50–1.35)
Calcium: 8.7 mg/dL (ref 8.4–10.5)
Chloride: 91 mmol/L — ABNORMAL LOW (ref 96–112)
Chloride: 92 mmol/L — ABNORMAL LOW (ref 96–112)
Creatinine, Ser: 1.58 mg/dL — ABNORMAL HIGH (ref 0.50–1.35)
Creatinine, Ser: 1.44 mg/dL — ABNORMAL HIGH (ref 0.50–1.35)
GFR calc Af Amer: 60 mL/min — ABNORMAL LOW (ref >90)
GFR calc non Af Amer: 51 mL/min — ABNORMAL LOW (ref >90)
GFR calc non Af Amer: 52 mL/min — ABNORMAL LOW (ref >90)
GFR calc non Af Amer: 57 mL/min — ABNORMAL LOW (ref >90)
GFR, EST AFRICAN AMERICAN: 59 mL/min — AB (ref >90)
GFR, EST AFRICAN AMERICAN: 66 mL/min — AB (ref >90)
GLUCOSE: 232 mg/dL — AB (ref 70–99)
Glucose, Bld: 227 mg/dL — ABNORMAL HIGH (ref 70–99)
Glucose, Bld: 434 mg/dL — ABNORMAL HIGH (ref 70–99)
POTASSIUM: 5.3 mmol/L — AB (ref 3.5–5.1)
POTASSIUM: 5.3 mmol/L — AB (ref 3.5–5.1)
Potassium: 4.3 mmol/L (ref 3.5–5.1)
SODIUM: 126 mmol/L — AB (ref 135–145)
Sodium: 127 mmol/L — ABNORMAL LOW (ref 135–145)
Sodium: 122 mmol/L — ABNORMAL LOW (ref 135–145)

## 2014-08-10 LAB — CBC WITH DIFFERENTIAL/PLATELET
BASOS PCT: 0 % (ref 0–1)
Basophils Absolute: 0 10*3/uL (ref 0.0–0.1)
EOS ABS: 0.3 10*3/uL (ref 0.0–0.7)
Eosinophils Relative: 3 % (ref 0–5)
HEMATOCRIT: 36.6 % — AB (ref 39.0–52.0)
HEMOGLOBIN: 13.2 g/dL (ref 13.0–17.0)
Lymphocytes Relative: 17 % (ref 12–46)
Lymphs Abs: 1.9 10*3/uL (ref 0.7–4.0)
MCH: 29 pg (ref 26.0–34.0)
MCHC: 36.1 g/dL — AB (ref 30.0–36.0)
MCV: 80.4 fL (ref 78.0–100.0)
MONO ABS: 0.6 10*3/uL (ref 0.1–1.0)
MONOS PCT: 5 % (ref 3–12)
NEUTROS ABS: 8.4 10*3/uL — AB (ref 1.7–7.7)
Neutrophils Relative %: 75 % (ref 43–77)
Platelets: 360 10*3/uL (ref 150–400)
RBC: 4.55 MIL/uL (ref 4.22–5.81)
RDW: 16.1 % — ABNORMAL HIGH (ref 11.5–15.5)
WBC: 11.3 10*3/uL — ABNORMAL HIGH (ref 4.0–10.5)

## 2014-08-10 LAB — GLUCOSE, CAPILLARY
GLUCOSE-CAPILLARY: 277 mg/dL — AB (ref 70–99)
GLUCOSE-CAPILLARY: 399 mg/dL — AB (ref 70–99)
GLUCOSE-CAPILLARY: 320 mg/dL — AB (ref 70–99)
Glucose-Capillary: 316 mg/dL — ABNORMAL HIGH (ref 70–99)
Glucose-Capillary: 193 mg/dL — ABNORMAL HIGH (ref 70–99)
Glucose-Capillary: 243 mg/dL — ABNORMAL HIGH (ref 70–99)

## 2014-08-10 LAB — COMPREHENSIVE METABOLIC PANEL
ALT: 13 U/L (ref 0–53)
ALT: 14 U/L (ref 0–53)
ANION GAP: 10 (ref 5–15)
AST: 16 U/L (ref 0–37)
AST: 21 U/L (ref 0–37)
Albumin: 2.8 g/dL — ABNORMAL LOW (ref 3.5–5.2)
Albumin: 3.2 g/dL — ABNORMAL LOW (ref 3.5–5.2)
Alkaline Phosphatase: 119 U/L — ABNORMAL HIGH (ref 39–117)
Alkaline Phosphatase: 143 U/L — ABNORMAL HIGH (ref 39–117)
Anion gap: 11 (ref 5–15)
BILIRUBIN TOTAL: 0.6 mg/dL (ref 0.3–1.2)
BUN: 38 mg/dL — AB (ref 6–23)
BUN: 40 mg/dL — AB (ref 6–23)
CHLORIDE: 90 mmol/L — AB (ref 96–112)
CO2: 23 mmol/L (ref 19–32)
CO2: 23 mmol/L (ref 19–32)
CREATININE: 1.84 mg/dL — AB (ref 0.50–1.35)
CREATININE: 2.11 mg/dL — AB (ref 0.50–1.35)
Calcium: 8.8 mg/dL (ref 8.4–10.5)
Calcium: 9.1 mg/dL (ref 8.4–10.5)
Chloride: 84 mmol/L — ABNORMAL LOW (ref 96–112)
GFR calc Af Amer: 42 mL/min — ABNORMAL LOW (ref >90)
GFR calc Af Amer: 49 mL/min — ABNORMAL LOW (ref >90)
GFR calc non Af Amer: 36 mL/min — ABNORMAL LOW (ref >90)
GFR, EST NON AFRICAN AMERICAN: 42 mL/min — AB (ref >90)
GLUCOSE: 319 mg/dL — AB (ref 70–99)
Glucose, Bld: 381 mg/dL — ABNORMAL HIGH (ref 70–99)
POTASSIUM: 4.4 mmol/L (ref 3.5–5.1)
Potassium: 4.7 mmol/L (ref 3.5–5.1)
Sodium: 118 mmol/L — CL (ref 135–145)
Sodium: 123 mmol/L — ABNORMAL LOW (ref 135–145)
TOTAL PROTEIN: 8.5 g/dL — AB (ref 6.0–8.3)
Total Bilirubin: 0.4 mg/dL (ref 0.3–1.2)
Total Protein: 7.8 g/dL (ref 6.0–8.3)

## 2014-08-10 LAB — URINALYSIS, ROUTINE W REFLEX MICROSCOPIC
Bilirubin Urine: NEGATIVE
HGB URINE DIPSTICK: NEGATIVE
KETONES UR: NEGATIVE mg/dL
Leukocytes, UA: NEGATIVE
Nitrite: NEGATIVE
Protein, ur: 30 mg/dL — AB
Specific Gravity, Urine: 1.023 (ref 1.005–1.030)
Urobilinogen, UA: 0.2 mg/dL (ref 0.0–1.0)
pH: 5 (ref 5.0–8.0)

## 2014-08-10 LAB — URINE MICROSCOPIC-ADD ON

## 2014-08-10 LAB — CBG MONITORING, ED: Glucose-Capillary: 444 mg/dL — ABNORMAL HIGH (ref 70–99)

## 2014-08-10 LAB — ETHANOL: Alcohol, Ethyl (B): <5 mg/dL (ref 0–9)

## 2014-08-10 LAB — DIGOXIN LEVEL: DIGOXIN LVL: 0.6 ng/mL — AB (ref 0.8–2.0)

## 2014-08-10 MED ORDER — SIMVASTATIN 40 MG PO TABS
40.0000 mg | ORAL_TABLET | Freq: Every day | ORAL | Status: DC
  Administered 2014-08-10 – 2014-08-12 (×3): 40 mg via ORAL
  Filled 2014-08-10 (×3): qty 1

## 2014-08-10 MED ORDER — SODIUM CHLORIDE 1 G PO TABS
1.0000 g | ORAL_TABLET | Freq: Once | ORAL | Status: AC
  Administered 2014-08-10: 1 g via ORAL
  Filled 2014-08-10: qty 1

## 2014-08-10 MED ORDER — SODIUM CHLORIDE 0.9 % IV SOLN: 1000.0000 mL | Freq: Once | INTRAVENOUS | Status: DC

## 2014-08-10 MED ORDER — IVABRADINE HCL 5 MG PO TABS
5.0000 mg | ORAL_TABLET | Freq: Every day | ORAL | Status: DC
  Administered 2014-08-10 – 2014-08-12 (×3): 5 mg via ORAL
  Filled 2014-08-10 (×4): qty 1

## 2014-08-10 MED ORDER — SODIUM CHLORIDE 0.9 % IV SOLN: 1000.0000 mL | INTRAVENOUS | Status: DC

## 2014-08-10 MED ORDER — SODIUM CHLORIDE 0.9 % IV BOLUS (SEPSIS)
1000.0000 mL | Freq: Once | INTRAVENOUS | Status: AC
  Administered 2014-08-10: 1000 mL via INTRAVENOUS

## 2014-08-10 MED ORDER — FOLIC ACID 1 MG PO TABS
1.0000 mg | ORAL_TABLET | Freq: Every day | ORAL | Status: DC
Start: 2014-08-10 — End: 2014-08-12
  Administered 2014-08-10 – 2014-08-12 (×3): 1 mg via ORAL
  Filled 2014-08-10 (×3): qty 1

## 2014-08-10 MED ORDER — DIGOXIN 250 MCG PO TABS
0.2500 mg | ORAL_TABLET | Freq: Every day | ORAL | Status: DC
  Administered 2014-08-10 – 2014-08-12 (×3): 0.25 mg via ORAL
  Filled 2014-08-10 (×3): qty 1

## 2014-08-10 MED ORDER — OXYCODONE HCL 5 MG PO TABS: 5.0000 mg | ORAL_TABLET | ORAL | Status: DC | PRN

## 2014-08-10 MED ORDER — HEPARIN SODIUM (PORCINE) 5000 UNIT/ML IJ SOLN
5000.0000 [IU] | Freq: Three times a day (TID) | INTRAMUSCULAR | Status: DC
  Administered 2014-08-10 (×2): 5000 [IU] via SUBCUTANEOUS
  Filled 2014-08-10 (×9): qty 1

## 2014-08-10 MED ORDER — SODIUM CHLORIDE 0.9 % IV SOLN
INTRAVENOUS | Status: DC
  Administered 2014-08-10: 04:00:00 via INTRAVENOUS

## 2014-08-10 MED ORDER — ASPIRIN EC 81 MG PO TBEC
81.0000 mg | DELAYED_RELEASE_TABLET | Freq: Every day | ORAL | Status: DC
  Administered 2014-08-10 – 2014-08-12 (×3): 81 mg via ORAL
  Filled 2014-08-10 (×3): qty 1

## 2014-08-10 MED ORDER — GLUCERNA SHAKE PO LIQD
237.0000 mL | ORAL | Status: AC
  Administered 2014-08-10 – 2014-08-11 (×2): 237 mL via ORAL

## 2014-08-10 MED ORDER — ENSURE ENLIVE PO LIQD
237.0000 mL | Freq: Two times a day (BID) | ORAL | Status: DC
  Administered 2014-08-10 (×2): 237 mL via ORAL

## 2014-08-10 MED ORDER — ALUM & MAG HYDROXIDE-SIMETH 200-200-20 MG/5ML PO SUSP
30.0000 mL | Freq: Four times a day (QID) | ORAL | Status: DC | PRN
Start: 2014-08-10 — End: 2014-08-12

## 2014-08-10 MED ORDER — CARVEDILOL 25 MG PO TABS
25.0000 mg | ORAL_TABLET | Freq: Two times a day (BID) | ORAL | Status: DC
  Administered 2014-08-10 – 2014-08-12 (×6): 25 mg via ORAL
  Filled 2014-08-10 (×7): qty 1

## 2014-08-10 MED ORDER — ADULT MULTIVITAMIN W/MINERALS CH
1.0000 | ORAL_TABLET | Freq: Every day | ORAL | Status: DC
  Administered 2014-08-10 – 2014-08-12 (×3): 1 via ORAL
  Filled 2014-08-10 (×3): qty 1

## 2014-08-10 MED ORDER — POTASSIUM CHLORIDE CRYS ER 20 MEQ PO TBCR
20.0000 meq | EXTENDED_RELEASE_TABLET | Freq: Two times a day (BID) | ORAL | Status: DC
  Administered 2014-08-10 (×2): 20 meq via ORAL
  Filled 2014-08-10 (×5): qty 1

## 2014-08-10 MED ORDER — ACETAMINOPHEN 650 MG RE SUPP: 650.0000 mg | Freq: Four times a day (QID) | RECTAL | Status: DC | PRN

## 2014-08-10 MED ORDER — SODIUM CHLORIDE 0.9 % IJ SOLN
3.0000 mL | Freq: Two times a day (BID) | INTRAMUSCULAR | Status: DC
  Administered 2014-08-11 – 2014-08-12 (×2): 3 mL via INTRAVENOUS

## 2014-08-10 MED ORDER — ONDANSETRON HCL 4 MG PO TABS: 4.0000 mg | ORAL_TABLET | Freq: Four times a day (QID) | ORAL | Status: DC | PRN

## 2014-08-10 MED ORDER — HYDROMORPHONE HCL 1 MG/ML IJ SOLN: 0.5000 mg | INTRAMUSCULAR | Status: DC | PRN

## 2014-08-10 MED ORDER — INSULIN ASPART 100 UNIT/ML ~~LOC~~ SOLN
0.0000 [IU] | Freq: Three times a day (TID) | SUBCUTANEOUS | Status: DC
  Administered 2014-08-10: 15 [IU] via SUBCUTANEOUS
  Administered 2014-08-10: 5 [IU] via SUBCUTANEOUS
  Administered 2014-08-11: 11 [IU] via SUBCUTANEOUS
  Administered 2014-08-11 – 2014-08-12 (×3): 15 [IU] via SUBCUTANEOUS
  Administered 2014-08-12: 11 [IU] via SUBCUTANEOUS
  Administered 2014-08-12: 15 [IU] via SUBCUTANEOUS

## 2014-08-10 MED ORDER — ONDANSETRON HCL 4 MG/2ML IJ SOLN: 4.0000 mg | Freq: Four times a day (QID) | INTRAMUSCULAR | Status: DC | PRN

## 2014-08-10 MED ORDER — VITAMIN B-1 100 MG PO TABS
100.0000 mg | ORAL_TABLET | Freq: Every day | ORAL | Status: DC
  Administered 2014-08-10 – 2014-08-12 (×3): 100 mg via ORAL
  Filled 2014-08-10 (×3): qty 1

## 2014-08-10 MED ORDER — ACETAMINOPHEN 325 MG PO TABS: 650.0000 mg | ORAL_TABLET | Freq: Four times a day (QID) | ORAL | Status: DC | PRN

## 2014-08-10 MED ORDER — INSULIN ASPART 100 UNIT/ML ~~LOC~~ SOLN
0.0000 [IU] | SUBCUTANEOUS | Status: DC
  Administered 2014-08-10: 2 [IU] via SUBCUTANEOUS
  Administered 2014-08-10: 5 [IU] via SUBCUTANEOUS

## 2014-08-10 MED ORDER — INSULIN ASPART 100 UNIT/ML ~~LOC~~ SOLN
8.0000 [IU] | Freq: Once | SUBCUTANEOUS | Status: AC
  Administered 2014-08-10: 8 [IU] via INTRAVENOUS
  Filled 2014-08-10: qty 1

## 2014-08-10 NOTE — H&P (Signed)
[facility] Admission History and Physical       Austin Alexander [id number] DOB: October 14, 1970 DOA: 08/10/2014  Referring physician: EDP PCP: Kevan Ny, MD  Specialists:   Chief Complaint: Dizziness and Blurry Vision  HPI: Austin Alexander is a 44 y.o. male with a history of Asthma, DM2, HTN, Chronic Systolic CHF with EF = 123456 who presented to the ED with complaints of glucose levels in the 400's all week. He was seen at his PCPs office and was referred to the ED.  He reports having blurry vision and lightheadedness. He also reports a weight loss of over 29 pounds in the past 3 months.    He reports having a poor appetite for the past 3 days and had not taken his Amaryl.   He was evaluated in the ED and was found to have a glucose level of 444, however, his sodium level was 118 and his BUN/Cr was 40/2.11.   He was referred for medical admission.       Review of Systems:  Constitutional: +Weight Loss, Night Sweats, Fevers, Chills, +Dizziness, +Light Headedness, +Fatigue, +Generalized Weakness HEENT: No Headaches, Difficulty Swallowing,Tooth/Dental Problems,Sore Throat,  No Sneezing, Rhinitis, Ear Ache, Nasal Congestion, or Post Nasal Drip,  Cardio-vascular:  No Chest pain, Orthopnea, PND, Edema in Lower Extremities, Anasarca, Dizziness, Palpitations  Resp: No Dyspnea, No DOE, No Productive Cough, No Non-Productive Cough, No Hemoptysis, No Wheezing.    GI: No Heartburn, Indigestion, Abdominal Pain, Nausea, Vomiting, Diarrhea, Constipation, Hematemesis, Hematochezia, Melena, Change in Bowel Habits,  +Loss of Appetite  GU: No Dysuria, No Change in Color of Urine, No Urgency or Urinary Frequency, No Flank pain.  Musculoskeletal: No Joint Pain or Swelling, No Decreased Range of Motion, No Back Pain.  Neurologic: No Syncope, No Seizures, Muscle Weakness, Paresthesia, +Blurry vision, Vision Disturbance or Loss, No Diplopia, No Vertigo, No Difficulty Walking,  Skin: No Rash or Lesions. Psych:  No Change in Mood or Affect, No Depression or Anxiety, No Memory loss, No Confusion, or Hallucinations   Past Medical History  Diagnosis Date  . Bronchitis   . Croup   . Tobacco abuse   . Dizziness   . Abscess of perineum   . Heartburn   . Indigestion   . H/O folliculitis   . 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 Alcohol abuse   . Hypertension     variable in nature  . Hyperlipidemia   . Multiple excoriations     multifactorial in origin  . Mild obesity   . Dietary noncompliance     History of,  . Situational stress     secondary to financial issues  . Erectile dysfunction     Multifactorial. Improved with Cialis. New onset.  . Cystic acne     with acne keloidosis  . Diabetes mellitus without complication     Type 2, uncontrolled with questionable improvement  . 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 EKG, abnormal     History of, with negative cardiac evaluation by history.  [name] COPD (chronic obstructive pulmonary disease)     moderate obstruction with low vital capacity  . Asthma     History of,  . CHF (congestive heart failure)      Past Surgical History  Procedure Laterality Date  . Incision and drainage perirectal abscess N/A 02/17/2014    Procedure: IRRIGATION AND DEBRIDEMENT PERIRECTAL  AND SCROTAL ABSCESS;  Surgeon: Coralie Keens, MD;  Location:  [facility];  Service: General;  Laterality: N/A;  . I&d extremity Left 06/28/2014    Procedure: IRRIGATION AND DEBRIDEMENT EXTREMITY;  Surgeon: Leanora Cover, MD;  Location: [facility];  Service: Orthopedics;  Laterality: Left;      Prior to Admission medications   Medication Sig Start Date End Date Taking? Authorizing Provider  aspirin EC 81 MG EC tablet Take 1 tablet (81 mg total) by mouth daily. 09/23/13   Ejiroghene Arlyce Dice, MD  carvedilol (COREG) 25 MG tablet Take 25 mg by mouth 2 (two) times daily with a meal.    Historical Provider, MD  digoxin (LANOXIN) 0.25 MG tablet Take 0.25  mg by mouth daily.    Historical Provider, MD  folic acid (FOLVITE) 1 MG tablet Take 1 tablet (1 mg total) by mouth daily. Patient not taking: Reported on 06/27/2014 04/08/14   Thurnell Lose, MD  furosemide (LASIX) 40 MG tablet Take 1 tablet (40 mg total) by mouth daily. Patient taking differently: Take 80 mg by mouth daily.  04/08/14   Thurnell Lose, MD  glimepiride (AMARYL) 4 MG tablet Take 4 mg by mouth daily. 03/18/14   Historical Provider, MD  HYDROcodone-acetaminophen (NORCO) 5-325 MG per tablet Take 1 tablet by mouth every 6 (six) hours as needed for moderate pain. 06/30/14   Cherene Altes, MD  ivabradine (CORLANOR) 5 MG TABS tablet Take 5 mg by mouth daily.    Historical Provider, MD  lisinopril (PRINIVIL,ZESTRIL) 20 MG tablet Take 40 mg by mouth daily.     Historical Provider, MD  metFORMIN (GLUCOPHAGE) 1000 MG tablet Take 1,000 mg by mouth 2 (two) times daily with a meal.    Historical Provider, MD  Multiple Vitamins-Minerals (CENTRUM ADULTS PO) Take 1 tablet by mouth daily.    Historical Provider, MD  potassium chloride SA (K-DUR,KLOR-CON) 20 MEQ tablet Take 20 mEq by mouth 2 (two) times daily.    Historical Provider, MD  simvastatin (ZOCOR) 40 MG tablet Take 1 tablet (40 mg total) by mouth daily. 09/23/13   Ejiroghene Arlyce Dice, MD  spironolactone (ALDACTONE) 25 MG tablet Take 1 tablet (25 mg total) by mouth daily. 09/23/13   Ejiroghene Arlyce Dice, MD  sulfamethoxazole-trimethoprim (BACTRIM DS,SEPTRA DS) 800-160 MG per tablet Take 1 tablet by mouth every 12 (twelve) hours. 07/01/14   Cherene Altes, MD  thiamine 100 MG tablet Take 1 tablet (100 mg total) by mouth daily. Patient not taking: Reported on 06/27/2014 04/08/14   Thurnell Lose, MD     No Known Allergies  Social History:  reports that he has been smoking Cigarettes.  He has a 20 pack-year smoking history. He does not have any smokeless tobacco history on file. He reports that he drinks about 7.2 oz of alcohol per  week. He reports that he does not use illicit drugs.    Family History  Problem Relation Age of Onset  . Diabetes type II Mother   . Hypertension Mother        Physical Exam:  GEN:  Pleasant Obese  44 y.o. African American male examined and in no acute distress; cooperative with exam Filed Vitals:   08/10/14 0245 08/10/14 0330 08/10/14 0348 08/10/14 0409  BP: 127/75 104/67  121/77  Pulse: 93 100  88  Temp:   98.2 F (36.8 C) 98 F (36.7 C)  TempSrc:   Oral   Resp:    19  Weight:    95.255 kg (210 lb)  SpO2: 95% 98%  100%   Blood pressure 121/77, pulse 88, temperature 98 F (36.7 C), temperature source Oral, resp. rate 19, weight 95.255 kg (210 lb), SpO2 100 %. PSYCH: He is alert and oriented x4; does not appear anxious does not appear depressed; affect is normal HEENT: Normocephalic and Atraumatic, Mucous membranes pink; PERRLA; EOM intact; Fundi:  Benign;  No scleral icterus, Nares: Patent, Oropharynx: Clear,     Neck:  FROM, No Cervical Lymphadenopathy nor Thyromegaly or Carotid Bruit; No JVD; Breasts:: Not examined CHEST WALL: No tenderness CHEST: Normal respiration, clear to auscultation bilaterally HEART: Regular rate and rhythm; no murmurs rubs or gallops BACK: No kyphosis or scoliosis; No CVA tenderness ABDOMEN: Positive Bowel Sounds, Obese, Soft Non-Tender, No Rebound or Guarding; No Masses, No Organomegaly Rectal Exam: Not done EXTREMITIES: No Cyanosis, Clubbing, or Edema; No Ulcerations. Genitalia: not examined PULSES: 2+ and symmetric SKIN: Normal hydration no rash or ulceration CNS:  Alert and Oriented x 4, No Focal Deficits Vascular: pulses palpable throughout    Labs on Admission:  Basic Metabolic Panel:  Recent Labs Lab 08/10/14 0345 08/10/14 2352  NA 123* 118*  K 4.4 4.7  CL 90* 84*  CO2 23 23  GLUCOSE 319* 381*  BUN 38* 40*  CREATININE 1.84* 2.11*  CALCIUM 8.8 9.1   Liver Function Tests:  Recent Labs Lab 08/10/14 0345 08/10/14 2352    AST 21 16  ALT 13 14  ALKPHOS 119* 143*  BILITOT 0.4 0.6  PROT 7.8 8.5*  ALBUMIN 2.8* 3.2*   No results for input(s): LIPASE, AMYLASE in the last 168 hours. No results for input(s): AMMONIA in the last 168 hours. CBC:  Recent Labs Lab 08/10/14 2352  WBC 11.3*  NEUTROABS 8.4*  HGB 13.2  HCT 36.6*  MCV 80.4  PLT 360   Cardiac Enzymes: No results for input(s): CKTOTAL, CKMB, CKMBINDEX, TROPONINI in the last 168 hours.  BNP (last 3 results)  Recent Labs  04/06/14 1252  BNP 1329.3*    ProBNP (last 3 results)  Recent Labs  09/22/13 0344  PROBNP 1845.0*    CBG:  Recent Labs Lab 08/09/14 2351 08/10/14 0204 08/10/14 0342  GLUCAP 370* 444* 316*    Radiological Exams on Admission: No results found.   EKG: Independently reviewed.    Assessment/Plan:   44 y.o. male with  Active Problems:   1.    Hyponatremia- due to Lasix and Spironolactone Rx   Hold lasix and Spironolactone   Gentle IVFs with NSS     2.    Hyperglycemia   SSI coverage PRN   Monitor Glucose Levels   Hold Metformin and Glimiperide due to AKI    3.    AKI (acute kidney injury)   Hold Metformin, Glimerpide and Bactrim Rx   Gentle Rehydration   Monitor BUN/Cr    4.    Chronic systolic CHF (congestive heart failure)   Restart Lasix and Spironolactone when Sodium improves     5.    HTN (hypertension)   Continue      6.   Type 2 diabetes mellitus with complication   SSI coverage PRN        7.   Asthma   stable     8.   DVT Prophylaxis      SQ Heparin          Code Status:     FULL CODE        Family Communication:   No Family Present    Disposition  Plan:    Inpatient Status        Time spent:   [facility] Pager [phone]   If [place] Please Contact the Day Rounding Team MD for [facility]  If 7PM-7AM, Please Contact Night-Floor Coverage  [email or web address] Password [email or web address] 08/10/2014, 6:30 AM     ADDENDUM:    Patient was seen and examined on 08/10/2014

## 2014-08-10 NOTE — Progress Notes (Signed)
[facility] PROGRESS NOTE  Lockwood Schnur [id number] DOB: November 04, 1970 DOA: 08/10/2014 PCP: Kevan Ny, MD  Assessment/Plan:  1. Hyponatremia- due to Lasix and Spironolactone Rx - Currently receiving gentle IV fluid rehydration, will obtain BMP every 6 hours. - Also administer sodium chloride tab 1    2. Hyperglycemia - SSI coverage PRN - Monitor Glucose Levels - Hold Metformin and Glimiperide due to AKI   3. AKI (acute kidney injury) - Hold Metformin, Glimerpide and Bactrim Rx - Gentle Rehydration - Monitor BUN/Cr, given elevated serum creatinine will obtain renal ultrasound   4. Chronic systolic CHF (congestive heart failure) -Agree with restarting Lasix and Spironolactone when Sodium improves    5. HTN (hypertension) - Continue carvedilol     6. Type 2 diabetes mellitus with complication - SSI coverage - diabetic diet     7. Asthma - resolved    8. DVT Prophylaxis - SQ Heparin    Code Status: full Family Communication: no family at bedside  Disposition Plan:  Pending improvement in condition, improvement in sodium levels   Consultants:  None  Procedures:  None  Antibiotics:  none  HPI/Subjective: Pt has no new complaints. No acute issues overnight.  Objective: Filed Vitals:   08/10/14 0839  BP: 109/64  Pulse: 84  Temp: 98.1 F (36.7 C)  Resp: 17    Intake/Output Summary (Last 24 hours) at 08/10/14 1534 Last data filed at 08/10/14 1317  Gross per 24 hour  Intake    480 ml  Output   2550 ml  Net  -2070 ml   Filed Weights   08/09/14 2346 08/10/14 0409  Weight: 99.054 kg (218 lb 6 oz) 95.255 kg (210 lb)    Exam:   General:  Pt in nad, alert and awake  Cardiovascular: rrr, no mrg  Respiratory: cta bl, no wheezes  Abdomen: soft, Nt, nd  Musculoskeletal: no cyanosis   Data Reviewed: Basic Metabolic Panel:  Recent Labs Lab 08/10/14 0345 08/10/14 0731  08/10/14 1220 08/10/14 2352  NA 123* 127* 122* 118*  K 4.4 4.3 5.3* 4.7  CL 90* 91* 88* 84*  CO2 23 25 23 23   GLUCOSE 319* 227* 434* 381*  BUN 38* 34* 37* 40*  CREATININE 1.84* 1.58* 1.55* 2.11*  CALCIUM 8.8 9.3 8.7 9.1   Liver Function Tests:  Recent Labs Lab 08/10/14 0345 08/10/14 2352  AST 21 16  ALT 13 14  ALKPHOS 119* 143*  BILITOT 0.4 0.6  PROT 7.8 8.5*  ALBUMIN 2.8* 3.2*   No results for input(s): LIPASE, AMYLASE in the last 168 hours. No results for input(s): AMMONIA in the last 168 hours. CBC:  Recent Labs Lab 08/10/14 0731 08/10/14 2352  WBC 9.0 11.3*  NEUTROABS  --  8.4*  HGB 12.5* 13.2  HCT 35.5* 36.6*  MCV 80.3 80.4  PLT 320 360   Cardiac Enzymes: No results for input(s): CKTOTAL, CKMB, CKMBINDEX, TROPONINI in the last 168 hours. BNP (last 3 results)  Recent Labs  04/06/14 1252  BNP 1329.3*    ProBNP (last 3 results)  Recent Labs  09/22/13 0344  PROBNP 1845.0*    CBG:  Recent Labs Lab 08/09/14 2351 08/10/14 0204 08/10/14 0342 08/10/14 0726 08/10/14 1121  GLUCAP 370* 444* 316* 193* 399*    No results found for this or any previous visit (from the past 240 hour(s)).   Studies: No results found.  Scheduled Meds: . aspirin EC  81 mg Oral Daily  . carvedilol  25 mg Oral BID  WC  . digoxin  0.25 mg Oral Daily  . feeding supplement (ENSURE ENLIVE)  237 mL Oral BID BM  . folic acid  1 mg Oral Daily  . heparin  5,000 Units Subcutaneous 3 times per day  . insulin aspart  0-15 Units Subcutaneous TID WC  . ivabradine  5 mg Oral Q breakfast  . multivitamin with minerals  1 tablet Oral Daily  . potassium chloride SA  20 mEq Oral BID  . simvastatin  40 mg Oral q1800  . sodium chloride  3 mL Intravenous Q12H  . thiamine  100 mg Oral Daily   Continuous Infusions: . sodium chloride 50 mL/hr at 08/10/14 0422       Time spent: > 35 minutes    Velvet Bathe  [facility] Pager [phone] If 7PM-7AM, please contact  night-coverage at [email or web address], password [facility] 08/10/2014, 3:34 PM  LOS: 0 days

## 2014-08-10 NOTE — ED Provider Notes (Signed)
CSN: [id number]     Arrival date & time 08/09/14  2341 History  This chart was scribe for Linton Flemings, MD by Judithann Sauger, ED Scribe. The patient was seen in room A05C/A05C and the patient's care was started at 1:05 AM.    Chief Complaint  Patient presents with  . Hyperglycemia   The history is provided by the patient. No language interpreter was used.   HPI Comments: Austin Alexander is a 44 y.o. male with a hx of HTN, DM Type II, COPD, and CHF who presents to the Emergency Department complaining of hyperglycemia onset today. He reports that he normally does not check his own sugar levels but his PCP checked his sugar today to be about 400. He reports associated decreased appetite with a 29 pound weight loss, fatigue, lightheadedness when he bends over, and mild visual blurriness. He adds that his symptoms has persisted over the course of several weeks. He denies any fever or chills. He also reports that he has been non compliant with his DM medication due to his loss of appetite. He states that he currently still smokes cigarettes but does not drink alcohol anymore.   Cardiologist.  Past Medical History  Diagnosis Date  . Bronchitis   . Croup   . Tobacco abuse   . Dizziness   . Abscess of perineum   . Heartburn   . Indigestion   . H/O folliculitis   . 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 Alcohol abuse   . Hypertension     variable in nature  . Hyperlipidemia   . Multiple excoriations     multifactorial in origin  . Mild obesity   . Dietary noncompliance     History of,  . Situational stress     secondary to financial issues  . Erectile dysfunction     Multifactorial. Improved with Cialis. New onset.  . Cystic acne     with acne keloidosis  . Diabetes mellitus without complication     Type 2, uncontrolled with questionable improvement  . 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 EKG, abnormal     History of, with negative cardiac  evaluation by history.  [name] COPD (chronic obstructive pulmonary disease)     moderate obstruction with low vital capacity  . Asthma     History of,  . CHF (congestive heart failure)    Past Surgical History  Procedure Laterality Date  . Incision and drainage perirectal abscess N/A 02/17/2014    Procedure: IRRIGATION AND DEBRIDEMENT PERIRECTAL  AND SCROTAL ABSCESS;  Surgeon: Coralie Keens, MD;  Location: [facility];  Service: General;  Laterality: N/A;  . I&d extremity Left 06/28/2014    Procedure: [id number] AND DEBRIDEMENT EXTREMITY;  Surgeon: Leanora Cover, MD;  Location: [facility];  Service: Orthopedics;  Laterality: Left;   Family History  Problem Relation Age of Onset  . Diabetes type II Mother   . Hypertension Mother    History  Substance Use Topics  . Smoking status: Current Every Day Smoker -- 1.00 packs/day for 20 years    Types: Cigarettes  . Smokeless tobacco: Not on file  . Alcohol Use: 7.2 oz/week    10 Cans of beer, 2 Shots of liquor per week     Comment: As of 04/06/14 - states occasional use    Review of Systems  Constitutional: Positive for appetite change and fatigue. Negative for fever and chills.  Eyes: Positive  for visual disturbance.  Neurological: Positive for light-headedness.      Allergies  Review of patient's allergies indicates no known allergies.  Home Medications   Prior to Admission medications   Medication Sig Start Date End Date Taking? Authorizing Provider  aspirin EC 81 MG EC tablet Take 1 tablet (81 mg total) by mouth daily. 09/23/13   Ejiroghene Arlyce Dice, MD  carvedilol (COREG) 25 MG tablet Take 25 mg by mouth 2 (two) times daily with a meal.    Historical Provider, MD  digoxin (LANOXIN) 0.25 MG tablet Take 0.25 mg by mouth daily.    Historical Provider, MD  folic acid (FOLVITE) 1 MG tablet Take 1 tablet (1 mg total) by mouth daily. Patient not taking: Reported on 06/27/2014 04/08/14   Thurnell Lose, MD  furosemide (LASIX) 40 MG tablet Take  1 tablet (40 mg total) by mouth daily. Patient taking differently: Take 80 mg by mouth daily.  04/08/14   Thurnell Lose, MD  glimepiride (AMARYL) 4 MG tablet Take 4 mg by mouth daily. 03/18/14   Historical Provider, MD  HYDROcodone-acetaminophen (NORCO) 5-325 MG per tablet Take 1 tablet by mouth every 6 (six) hours as needed for moderate pain. 06/30/14   Cherene Altes, MD  ivabradine (CORLANOR) 5 MG TABS tablet Take 5 mg by mouth daily.    Historical Provider, MD  lisinopril (PRINIVIL,ZESTRIL) 20 MG tablet Take 40 mg by mouth daily.     Historical Provider, MD  metFORMIN (GLUCOPHAGE) 1000 MG tablet Take 1,000 mg by mouth 2 (two) times daily with a meal.    Historical Provider, MD  Multiple Vitamins-Minerals (CENTRUM ADULTS PO) Take 1 tablet by mouth daily.    Historical Provider, MD  potassium chloride SA (K-DUR,KLOR-CON) 20 MEQ tablet Take 20 mEq by mouth 2 (two) times daily.    Historical Provider, MD  simvastatin (ZOCOR) 40 MG tablet Take 1 tablet (40 mg total) by mouth daily. 09/23/13   Ejiroghene Arlyce Dice, MD  spironolactone (ALDACTONE) 25 MG tablet Take 1 tablet (25 mg total) by mouth daily. 09/23/13   Ejiroghene Arlyce Dice, MD  sulfamethoxazole-trimethoprim (BACTRIM DS,SEPTRA DS) 800-160 MG per tablet Take 1 tablet by mouth every 12 (twelve) hours. 07/01/14   Cherene Altes, MD  thiamine 100 MG tablet Take 1 tablet (100 mg total) by mouth daily. Patient not taking: Reported on 06/27/2014 04/08/14   Thurnell Lose, MD   BP 120/77 mmHg  Pulse 94  Temp(Src) 98.8 F (37.1 C)  Resp 16  Wt 218 lb 6 oz (99.054 kg)  SpO2 96% Physical Exam  Constitutional: He is oriented to person, place, and time. He appears well-developed and well-nourished. No distress.  HENT:  Head: Normocephalic and atraumatic.  Nose: Nose normal.  Mouth/Throat: Oropharynx is clear and moist.  Eyes: Conjunctivae and EOM are normal. Pupils are equal, round, and reactive to light.  Neck: Normal range of motion.  Neck supple. No JVD present. No tracheal deviation present. No thyromegaly present.  Cardiovascular: Normal rate, regular rhythm, normal heart sounds and intact distal pulses.  Exam reveals no gallop and no friction rub.   No murmur heard. Pulmonary/Chest: Effort normal and breath sounds normal. No stridor. No respiratory distress. He has no wheezes. He has no rales. He exhibits no tenderness.  Abdominal: Soft. Bowel sounds are normal. He exhibits no distension and no mass. There is no tenderness. There is no rebound and no guarding.  Musculoskeletal: Normal range of motion. He exhibits no edema  or tenderness.  Lymphadenopathy:    He has no cervical adenopathy.  Neurological: He is alert and oriented to person, place, and time. He displays normal reflexes. He exhibits normal muscle tone. Coordination normal.  Skin: Skin is warm and dry. No rash noted. No erythema. No pallor.  Psychiatric: He has a normal mood and affect. His behavior is normal. Judgment and thought content normal.  Nursing note and vitals reviewed.   ED Course  Procedures (including critical care time) DIAGNOSTIC STUDIES: Oxygen Saturation is 98% on RA, normal by my interpretation.    COORDINATION OF CARE: 1:13 AM- Pt advised of plan for treatment and pt agrees.    Labs Review Labs Reviewed  CBC WITH DIFFERENTIAL/PLATELET - Abnormal; Notable for the following:    WBC 11.3 (*)    HCT 36.6 (*)    MCHC 36.1 (*)    RDW 16.1 (*)    Neutro Abs 8.4 (*)    All other components within normal limits  COMPREHENSIVE METABOLIC PANEL - Abnormal; Notable for the following:    Sodium 118 (*)    Chloride 84 (*)    Glucose, Bld 381 (*)    BUN 40 (*)    Creatinine, Ser 2.11 (*)    Total Protein 8.5 (*)    Albumin 3.2 (*)    Alkaline Phosphatase 143 (*)    GFR calc non Af Amer 36 (*)    GFR calc Af Amer 42 (*)    All other components within normal limits  URINALYSIS, ROUTINE W REFLEX MICROSCOPIC - Abnormal; Notable for the  following:    Glucose, UA >1000 (*)    Protein, ur 30 (*)    All other components within normal limits  DIGOXIN LEVEL - Abnormal; Notable for the following:    Digoxin Level 0.6 (*)    All other components within normal limits  COMPREHENSIVE METABOLIC PANEL - Abnormal; Notable for the following:    Sodium 123 (*)    Chloride 90 (*)    Glucose, Bld 319 (*)    BUN 38 (*)    Creatinine, Ser 1.84 (*)    Albumin 2.8 (*)    Alkaline Phosphatase 119 (*)    GFR calc non Af Amer 42 (*)    GFR calc Af Amer 49 (*)    All other components within normal limits  GLUCOSE, CAPILLARY - Abnormal; Notable for the following:    Glucose-Capillary 316 (*)    All other components within normal limits  CBG MONITORING, ED - Abnormal; Notable for the following:    Glucose-Capillary 370 (*)    All other components within normal limits  CBG MONITORING, ED - Abnormal; Notable for the following:    Glucose-Capillary 444 (*)    All other components within normal limits  ETHANOL  URINE MICROSCOPIC-ADD ON  HEMOGLOBIN [id number]  BASIC METABOLIC PANEL  CBC    Imaging Review No results found.   EKG Interpretation None      MDM   Final diagnoses:  Hyperglycemia  Type 2 diabetes mellitus with complication  Hyponatremia  Chronic congestive heart failure, unspecified congestive heart failure type    44 year old male with history of type 2 diabetes who presents with elevated blood sugar and generalized fatigue throat the week.  Patient has not been taking his Amaryl as he has lost his appetite over the week and has not been eating.  Patient saw his doctor today and was told his blood sugar was elevated.  He does not check  it regularly.  Patient with significant CHF history, with an EF of 20.  I'm hesitant to load him with fluids to help with his acute kidney injury and hyperglycemia for fear sending him into acute CHF.  Will start with gentle hydration here in the emergency department.  Awaiting digoxin level,  and urinalysis.  Feel the patient will need admission to the hospital for further management of his hyponatremia, acute kidney injury, hyperglycemia  I personally performed the services described in this documentation, which was scribed in my presence. The recorded information has been reviewed and is accurate.    Linton Flemings, MD 08/10/14 [phone]

## 2014-08-10 NOTE — ED Notes (Addendum)
Gave pt water to drink per Dr Sharol Given

## 2014-08-10 NOTE — Progress Notes (Signed)
Admission note:  Arrival Method: Pt arrived on stretcher from ED Mental Orientation: Alert and oriented x 4 Telemetry: Telemetry box 22 applied. CCMD notified. Patient running normal sinus rhythm Assessment: Completed Skin: Healing surgical incision to left hand. Skin dry and intact IV: Left forearm IV. Clean, dry and intact. Normal saline running at 60ml/hr Pain: Patient states no pain at this time Tubes: N/A Safety Measures: Non-slip socks placed, bed in lowest position Fall Prevention Safety Plan: Reviewed with patient Admission Screening: Completed 6700 Orientation: Patient has been oriented to the unit, staff and to the room. Patient lying comfortably in bed with no further needs stated at this time. Call light within reach. Will continue to monitor.   Shelbie Hutching, RN, BSN

## 2014-08-10 NOTE — Progress Notes (Signed)
Nutrition Brief Note  Patient identified on the Malnutrition Screening Tool (MST) Report  Wt Readings from Last 15 Encounters:  08/10/14 210 lb (95.255 kg)  06/29/14 232 lb 2.3 oz (105.3 kg)  04/08/14 227 lb 6.4 oz (103.148 kg)  02/19/14 219 lb 5.7 oz (99.5 kg)  11/04/13 219 lb 6.4 oz (99.519 kg)  09/23/13 221 lb 8 oz (100.472 kg)    Body mass index is 29.3 kg/(m^2). Patient meets criteria for Overweight based on current BMI. Pt reports losing 10 lbs in the past 2 weeks due to not feeling well and eating less. He admits to drinking more soda and juice during this time which likely contributed to elevated glucose. Pt demonstrated some knowledge on diabetic diet but, appreciated further discussion with RD.   RD provided "Carbohydrate Counting for People with Diabetes" handout from the Academy of [employer]etics. Discussed different food groups and their effects on blood sugar, emphasizing carbohydrate-containing foods. Provided list of carbohydrates and recommended serving sizes of common foods.  Discussed importance of controlled and consistent carbohydrate intake throughout the day. Provided examples of ways to balance meals/snacks and encouraged intake of high-fiber, whole grain complex carbohydrates. Emphasized the importance of limiting sugary beverages. Teach back method used.  Expect good compliance.  Current diet order is Carb Modified, patient is consuming approximately 100% of meals at this time. Pt likes the Ensure Enlive supplements however, blood sugar is elevated. Labs and medications reviewed.   No nutrition interventions warranted at this time. If nutrition issues arise, please consult RD.    Pryor Ochoa RD, LDN Inpatient Clinical Dietitian Pager: [phone] After Hours Pager: [phone]

## 2014-08-11 DIAGNOSIS — J45909 Unspecified asthma, uncomplicated: Secondary | ICD-10-CM

## 2014-08-11 LAB — BASIC METABOLIC PANEL
ANION GAP: 9 (ref 5–15)
ANION GAP: 9 (ref 5–15)
Anion gap: 9 (ref 5–15)
Anion gap: 9 (ref 5–15)
BUN: 37 mg/dL — ABNORMAL HIGH (ref 6–20)
BUN: 31 mg/dL — AB (ref 6–20)
BUN: 31 mg/dL — ABNORMAL HIGH (ref 6–20)
BUN: 31 mg/dL — ABNORMAL HIGH (ref 6–20)
CALCIUM: 9 mg/dL (ref 8.9–10.3)
CALCIUM: 9.2 mg/dL (ref 8.9–10.3)
CHLORIDE: 94 mmol/L — AB (ref 101–111)
CHLORIDE: 94 mmol/L — AB (ref 101–111)
CHLORIDE: 94 mmol/L — AB (ref 101–111)
CO2: 24 mmol/L (ref 22–32)
CO2: 24 mmol/L (ref 22–32)
CO2: 24 mmol/L (ref 22–32)
CO2: 25 mmol/L (ref 22–32)
CREATININE: 1.27 mg/dL — AB (ref 0.61–1.24)
Calcium: 9.1 mg/dL (ref 8.9–10.3)
Calcium: 9.1 mg/dL (ref 8.9–10.3)
Chloride: 96 mmol/L — ABNORMAL LOW (ref 101–111)
Creatinine, Ser: 1.35 mg/dL — ABNORMAL HIGH (ref 0.61–1.24)
Creatinine, Ser: 1.25 mg/dL — ABNORMAL HIGH (ref 0.61–1.24)
Creatinine, Ser: 1.39 mg/dL — ABNORMAL HIGH (ref 0.61–1.24)
GFR calc Af Amer: >60 mL/min (ref >60)
GFR calc Af Amer: >60 mL/min (ref >60)
GFR calc Af Amer: >60 mL/min (ref >60)
GFR calc non Af Amer: >60 mL/min (ref >60)
GFR calc non Af Amer: >60 mL/min (ref >60)
GFR calc non Af Amer: >60 mL/min (ref >60)
GFR calc non Af Amer: 59 mL/min — ABNORMAL LOW (ref >60)
GLUCOSE: 335 mg/dL — AB (ref 70–99)
GLUCOSE: 353 mg/dL — AB (ref 70–99)
Glucose, Bld: 413 mg/dL — ABNORMAL HIGH (ref 70–99)
Glucose, Bld: 374 mg/dL — ABNORMAL HIGH (ref 70–99)
POTASSIUM: 5.2 mmol/L — AB (ref 3.5–5.1)
Potassium: 4.6 mmol/L (ref 3.5–5.1)
Potassium: 5.3 mmol/L — ABNORMAL HIGH (ref 3.5–5.1)
Potassium: 5.6 mmol/L — ABNORMAL HIGH (ref 3.5–5.1)
SODIUM: 127 mmol/L — AB (ref 135–145)
SODIUM: 128 mmol/L — AB (ref 135–145)
Sodium: 127 mmol/L — ABNORMAL LOW (ref 135–145)
Sodium: 129 mmol/L — ABNORMAL LOW (ref 135–145)

## 2014-08-11 LAB — GLUCOSE, CAPILLARY
GLUCOSE-CAPILLARY: 376 mg/dL — AB (ref 70–99)
GLUCOSE-CAPILLARY: 314 mg/dL — AB (ref 70–99)
Glucose-Capillary: 349 mg/dL — ABNORMAL HIGH (ref 70–99)
Glucose-Capillary: 351 mg/dL — ABNORMAL HIGH (ref 70–99)
Glucose-Capillary: 353 mg/dL — ABNORMAL HIGH (ref 70–99)
Glucose-Capillary: 366 mg/dL — ABNORMAL HIGH (ref 70–99)

## 2014-08-11 NOTE — Progress Notes (Signed)
[facility] PROGRESS NOTE  Ithiel Dunkley [id number] DOB: 1971/01/26 DOA: 08/10/2014 PCP: Kevan Ny, MD  Assessment/Plan:  1. Hyponatremia- due to Lasix and Spironolactone Rx - improved on gentle fluid rehydration with normal saline. Will hold normal saline as I do not want to over correct sodium levels too quickly. - continue to monitor sodium levels.    2. Hyperglycemia - SSI coverage PRN - Monitor Glucose Levels - Hold Metformin and Glimiperide due to AKI   3. AKI (acute kidney injury) - Hold Metformin, Glimerpide and Bactrim Rx - improved with gentle fluid hydration. Continue to monitor serum creatinine. Most likely prerenal causes of elevated serum creatinine - normal renal u/s reported.   4. Chronic systolic CHF (congestive heart failure) -Agree with restarting Lasix and Spironolactone when Sodium improves    5. HTN (hypertension) - Continue carvedilol   6. Type 2 diabetes mellitus with complication - SSI coverage - diabetic diet   7. Asthma - stable, patient breathing comfortably    8. DVT Prophylaxis - SQ Heparin    Code Status: full Family Communication: no family at bedside  Disposition Plan:  Pending improvement in condition, improvement in sodium levels   Consultants:  None  Procedures:  None  Antibiotics:  none  HPI/Subjective: No acute issues reported to me by patient overnight.  Objective: Filed Vitals:   08/11/14 1635  BP: 130/82  Pulse: 77  Temp: 97.7 F (36.5 C)  Resp: 16    Intake/Output Summary (Last 24 hours) at 08/11/14 1643 Last data filed at 08/11/14 1635  Gross per 24 hour  Intake    932 ml  Output   1800 ml  Net   -868 ml   Filed Weights   08/09/14 2346 08/10/14 0409 08/10/14 2015  Weight: 99.054 kg (218 lb 6 oz) 95.255 kg (210 lb) 99.428 kg (219 lb 3.2 oz)    Exam:   General:  Pt in nad, alert and awake  Cardiovascular: rrr, no  mrg  Respiratory: cta bl, no wheezes  Abdomen: soft, Nt, nd  Musculoskeletal: no cyanosis   Data Reviewed: Basic Metabolic Panel:  Recent Labs Lab 08/10/14 1220 08/10/14 1709 08/10/14 2352 08/11/14 0318 08/11/14 0903  NA 122* 126* 118* 127* 127*  K 5.3* 5.3* 4.7 5.2* 4.6  CL 88* 92* 84* 94* 94*  CO2 23 23 23 24 24   GLUCOSE 434* 232* 381* 335* 413*  BUN 37* 38* 40* 37* 31*  CREATININE 1.55* 1.44* 2.11* 1.35* 1.25*  CALCIUM 8.7 9.1 9.1 9.0 9.1   Liver Function Tests:  Recent Labs Lab 08/10/14 0345 08/10/14 2352  AST 21 16  ALT 13 14  ALKPHOS 119* 143*  BILITOT 0.4 0.6  PROT 7.8 8.5*  ALBUMIN 2.8* 3.2*   No results for input(s): LIPASE, AMYLASE in the last 168 hours. No results for input(s): AMMONIA in the last 168 hours. CBC:  Recent Labs Lab 08/10/14 0731 08/10/14 2352  WBC 9.0 11.3*  NEUTROABS  --  8.4*  HGB 12.5* 13.2  HCT 35.5* 36.6*  MCV 80.3 80.4  PLT 320 360   Cardiac Enzymes: No results for input(s): CKTOTAL, CKMB, CKMBINDEX, TROPONINI in the last 168 hours. BNP (last 3 results)  Recent Labs  04/06/14 1252  BNP 1329.3*    ProBNP (last 3 results)  Recent Labs  09/22/13 0344  PROBNP 1845.0*    CBG:  Recent Labs Lab 08/10/14 2357 08/11/14 0408 08/11/14 0727 08/11/14 1111 08/11/14 1612  GLUCAP 349* 353* 376* 314* 366*  No results found for this or any previous visit (from the past 240 hour(s)).   Studies: US Renal  08/10/2014   CLINICAL DATA:  Acute renal failure  EXAM: RENAL / URINARY TRACT ULTRASOUND COMPLETE  COMPARISON:  None.  FINDINGS: Right Kidney:  Length: 12 cm. Echogenicity within normal limits. No mass or hydronephrosis visualized.  Left Kidney:  Length: 11.5 cm. Echogenicity within normal limits. No mass or hydronephrosis visualized.  Bladder:  Appears normal for degree of bladder distention.  IMPRESSION: Normal renal ultrasound   Electronically Signed   By: Skipper Cliche M.D.   On: 08/10/2014 21:44     Scheduled Meds: . aspirin EC  81 mg Oral Daily  . carvedilol  25 mg Oral BID WC  . digoxin  0.25 mg Oral Daily  . folic acid  1 mg Oral Daily  . heparin  5,000 Units Subcutaneous 3 times per day  . insulin aspart  0-15 Units Subcutaneous TID WC  . ivabradine  5 mg Oral Q breakfast  . multivitamin with minerals  1 tablet Oral Daily  . potassium chloride SA  20 mEq Oral BID  . simvastatin  40 mg Oral q1800  . sodium chloride  3 mL Intravenous Q12H  . thiamine  100 mg Oral Daily   Continuous Infusions:       Time spent: > 35 minutes    Velvet Bathe  [facility] Pager [phone] If 7PM-7AM, please contact night-coverage at [email or web address], password [email or web address] 08/11/2014, 4:43 PM  LOS: 1 day

## 2014-08-12 DIAGNOSIS — E875 Hyperkalemia: Secondary | ICD-10-CM

## 2014-08-12 LAB — BASIC METABOLIC PANEL
ANION GAP: 8 (ref 5–15)
BUN: 30 mg/dL — AB (ref 6–20)
CALCIUM: 9.5 mg/dL (ref 8.9–10.3)
CO2: 25 mmol/L (ref 22–32)
Chloride: 96 mmol/L — ABNORMAL LOW (ref 101–111)
Creatinine, Ser: 1.07 mg/dL (ref 0.61–1.24)
GLUCOSE: 373 mg/dL — AB (ref 70–99)
Potassium: 5.3 mmol/L — ABNORMAL HIGH (ref 3.5–5.1)
Sodium: 129 mmol/L — ABNORMAL LOW (ref 135–145)

## 2014-08-12 LAB — HEMOGLOBIN A1C
Hgb A1c MFr Bld: 15.5 % — ABNORMAL HIGH (ref 4.8–5.6)
MEAN PLASMA GLUCOSE: 398 mg/dL

## 2014-08-12 LAB — GLUCOSE, CAPILLARY
GLUCOSE-CAPILLARY: 419 mg/dL — AB (ref 70–99)
GLUCOSE-CAPILLARY: 311 mg/dL — AB (ref 70–99)
GLUCOSE-CAPILLARY: 323 mg/dL — AB (ref 70–99)
Glucose-Capillary: 404 mg/dL — ABNORMAL HIGH (ref 70–99)

## 2014-08-12 MED ORDER — SODIUM CHLORIDE 0.9 % IV BOLUS (SEPSIS)
500.0000 mL | Freq: Once | INTRAVENOUS | Status: AC
  Administered 2014-08-12: 500 mL via INTRAVENOUS

## 2014-08-12 MED ORDER — BLOOD GLUCOSE METER KIT: PACK | Status: AC

## 2014-08-12 MED ORDER — INSULIN DETEMIR 100 UNIT/ML ~~LOC~~ SOLN
15.0000 [IU] | Freq: Every day | SUBCUTANEOUS | Status: DC
  Administered 2014-08-12: 15 [IU] via SUBCUTANEOUS
  Filled 2014-08-12: qty 0.15

## 2014-08-12 MED ORDER — SODIUM POLYSTYRENE SULFONATE 15 GM/60ML PO SUSP
15.0000 g | Freq: Once | ORAL | Status: AC
  Administered 2014-08-12: 15 g via ORAL

## 2014-08-12 MED ORDER — INSULIN ASPART 100 UNIT/ML ~~LOC~~ SOLN
1.0000 [IU] | Freq: Once | SUBCUTANEOUS | Status: AC
  Administered 2014-08-12: 1 [IU] via SUBCUTANEOUS

## 2014-08-12 MED ORDER — INSULIN DETEMIR 100 UNIT/ML ~~LOC~~ SOLN: 15.0000 [IU] | Freq: Every day | SUBCUTANEOUS | Status: DC

## 2014-08-12 NOTE — Progress Notes (Signed)
Pt administered insulin to himself with nurse's supervision. Pt watched education videos on diabetes.      6:07 PM 08/12/2014 Penni Bombard, RN

## 2014-08-12 NOTE — Progress Notes (Signed)
Rechecked blood sugar after breakfast, 404

## 2014-08-12 NOTE — Progress Notes (Signed)
Austin Alexander to be D/C'd Home per MD order.  Discussed prescriptions and follow up appointments with the patient. Prescriptions given to patient, medication list explained in detail. Pt verbalized understanding.    Medication List    STOP taking these medications        lisinopril 20 MG tablet  Commonly known as:  PRINIVIL,ZESTRIL     metFORMIN 1000 MG tablet  Commonly known as:  GLUCOPHAGE     potassium chloride SA 20 MEQ tablet  Commonly known as:  K-DUR,KLOR-CON     spironolactone 25 MG tablet  Commonly known as:  ALDACTONE      TAKE these medications        aspirin 81 MG EC tablet  Take 1 tablet (81 mg total) by mouth daily.     blood glucose meter kit and supplies  Dispense based on patient and insurance preference. Use up to four times daily as directed. (FOR ICD-9 250.00, 250.01).     carvedilol 25 MG tablet  Commonly known as:  COREG  Take 25 mg by mouth 2 (two) times daily with a meal.     CENTRUM ADULTS PO  Take 1 tablet by mouth daily.     digoxin 0.25 MG tablet  Commonly known as:  LANOXIN  Take 0.25 mg by mouth daily.     furosemide 40 MG tablet  Commonly known as:  LASIX  Take 1 tablet (40 mg total) by mouth daily.     glimepiride 4 MG tablet  Commonly known as:  AMARYL  Take 4 mg by mouth daily.     insulin detemir 100 UNIT/ML injection  Commonly known as:  LEVEMIR  Inject 0.15 mLs (15 Units total) into the skin daily.  Start taking on:  08/13/2014     ivabradine 5 MG Tabs tablet  Commonly known as:  CORLANOR  Take 5 mg by mouth daily.     simvastatin 40 MG tablet  Commonly known as:  ZOCOR  Take 1 tablet (40 mg total) by mouth daily.        Filed Vitals:   08/12/14 1723  BP: 131/81  Pulse: 80  Temp: 98 F (36.7 C)  Resp: 18    Skin clean, dry and intact without evidence of skin break down, no evidence of skin tears noted. IV catheter discontinued intact. Site without signs and symptoms of complications. Dressing and pressure  applied. Pt denies pain at this time. No complaints noted.  An After Visit Summary was printed and given to the patient. Patient escorted and D/C home via private auto.  [name] A 08/12/2014 7:02 PM

## 2014-08-12 NOTE — Progress Notes (Signed)
Pt. Blood sugar 419, MD notified, advised to give 1 extra unit and recheck. Will continue to monitor.   Penni Bombard, RN 7:52 AM 08/12/2014

## 2014-08-12 NOTE — Progress Notes (Signed)
Inpatient Diabetes Program Recommendations  AACE/ADA: New Consensus Statement on Inpatient Glycemic Control (2013)  Target Ranges:  Prepandial:   less than 140 mg/dL      Peak postprandial:   less than 180 mg/dL (1-2 hours)      Critically ill patients:  140 - 180 mg/dL    Outpatient Diabetes medications: Amaryl, Metformin  Results for Austin Alexander, Austin Alexander (MRN [id number]) as of 08/12/2014 14:23  Ref. Range 09/22/2013 16:58 02/19/2014 16:00 08/09/2014 23:52  Hemoglobin A1C Latest Ref Range: 4.8-5.6 % 9.4 (H) 6.8 (H) 15.5 (H)   MD, will pt be discharged home on insulin? Please address so that bedside education can begin. Thank you  Raoul Pitch BSN, RN,CDE Inpatient Diabetes Coordinator [phone] (team pager)

## 2014-08-12 NOTE — Discharge Summary (Signed)
Physician Discharge Summary  Rizwan Kuyper OVZ:[id number] DOB: 1971-01-12 DOA: 08/10/2014  PCP: Kevan Ny, MD  Admit date: 08/10/2014 Discharge date: 08/12/2014  Time spent: 35 minutes  Recommendations for Outpatient Follow-up:  1. Monitor blood sugars and decide based on blood sugars to adjust hypoglycemic agents 2. Elevated hemoglobin A1c of 15.5 as such started on Levemir. Did not start metformin on discharge because of patient's recent history of elevated creatinine 3. Please encourage compliance with diabetic diet 4. Monitor serum creatinine, sodium, and potassium levels  Discharge Diagnoses:  Active Problems:   HTN (hypertension)   Hyperglycemia   Hyponatremia   AKI (acute kidney injury)   Type 2 diabetes mellitus with complication   Chronic systolic CHF (congestive heart failure)   Asthma   Discharge Condition: stable  Diet recommendation: Diabetic  Filed Weights   08/10/14 0409 08/10/14 2015 08/11/14 2015  Weight: 95.255 kg (210 lb) 99.428 kg (219 lb 3.2 oz) 98.657 kg (217 lb 8 oz)    History of present illness:  From original history of present illness: 44 y.o. male with a history of Asthma, DM2, HTN, Chronic Systolic CHF with EF = 41% who presented to the ED with complaints of glucose levels in the 400's all week. He was seen at his PCPs office and was referred to the ED. He reports having blurry vision and lightheadedness. He also reports a weight loss of over 29 pounds in the past 3 months. He reports having a poor appetite for the past 3 days and had not taken his Amaryl.  Hospital Course:  1. Hyponatremia- due to Lasix and Spironolactone Rx - Improved with gentle fluid rehydration and improved oral intake. Most likely secondary to poor oral intake and medications listed above. - Recommend continued monitoring after discharge. Patient assures me he has follow-up this Thursday on the fifth    2. Hyperglycemia - Patient did not want to stay for  improved blood sugar control. I have added long-acting insulin regimen and his blood sugars are improved. Please see discussion below   3. AKI (acute kidney injury) - Resolved off of nephrotoxic agents and improved IV fluid hydration. - We'll hold lisinopril on discharge given mild hyperkalemia   4. Chronic systolic CHF (congestive heart failure) -We'll continue Lasix on discharge was not able to continue lisinopril or spironolactone due to mild hyperkalemia. Please decide when to continue - Continue beta blocker    5. HTN (hypertension) - Continue carvedilol - Please see above   6. Type 2 diabetes mellitus with complication - Difficult in that patient has poor compliance. Encouraged compliance with diet. Given his elevated hemoglobin A1c will discharge him on Levemir and continue his glipizide. Did not continue metformin given recent history of renal failure.   7. Asthma - stable, patient breathing comfortably  8. Hyperkalemia - Mild elevation in potassium level above normal levels. We'll administer Kayexalate prior to discharge. Patient did not want to stay for further monitoring despite my discussion with him as he wants to leave the hospital so he can attend his cousins funeral tomorrow  Procedures:  None  Consultations:  None  Discharge Exam: Filed Vitals:   08/12/14 0950  BP: 137/89  Pulse: 83  Temp: 97.8 F (36.6 C)  Resp: 18    General: Pt in nad, alert and awake Cardiovascular: rrr, no mrg Respiratory: cta bl, no wheezes  Discharge Instructions   Discharge Instructions    Call MD for:  difficulty breathing, headache or visual disturbances    Complete  by:  As directed      Call MD for:  extreme fatigue    Complete by:  As directed      Call MD for:  persistant nausea and vomiting    Complete by:  As directed      Diet - low sodium heart healthy    Complete by:  As directed      Discharge instructions     Complete by:  As directed   Recommend follow-up with your primary care physician within next week. You're to gets her sodium, potassium, and serum creatinine levels rechecked. Also recommend having your blood sugars act of which you are to check at least 2 times daily once preprandial and once postprandial. Also your primary care physician should decide when to continue your lisinopril and spironolactone     Increase activity slowly    Complete by:  As directed           Current Discharge Medication List    START taking these medications   Details  blood glucose meter kit and supplies Dispense based on patient and insurance preference. Use up to four times daily as directed. (FOR ICD-9 250.00, 250.01). Qty: 1 each, Refills: 0    insulin detemir (LEVEMIR) 100 UNIT/ML injection Inject 0.15 mLs (15 Units total) into the skin daily. Qty: 10 mL, Refills: 0      CONTINUE these medications which have NOT CHANGED   Details  aspirin EC 81 MG EC tablet Take 1 tablet (81 mg total) by mouth daily. Qty: 30 tablet, Refills: 6    carvedilol (COREG) 25 MG tablet Take 25 mg by mouth 2 (two) times daily with a meal.    digoxin (LANOXIN) 0.25 MG tablet Take 0.25 mg by mouth daily.    furosemide (LASIX) 40 MG tablet Take 1 tablet (40 mg total) by mouth daily. Qty: 30 tablet, Refills: 0    glimepiride (AMARYL) 4 MG tablet Take 4 mg by mouth daily. Refills: 0    ivabradine (CORLANOR) 5 MG TABS tablet Take 5 mg by mouth daily.    Multiple Vitamins-Minerals (CENTRUM ADULTS PO) Take 1 tablet by mouth daily.    simvastatin (ZOCOR) 40 MG tablet Take 1 tablet (40 mg total) by mouth daily. Qty: 30 tablet, Refills: 0      STOP taking these medications     lisinopril (PRINIVIL,ZESTRIL) 20 MG tablet      metFORMIN (GLUCOPHAGE) 1000 MG tablet      potassium chloride SA (K-DUR,KLOR-CON) 20 MEQ tablet      spironolactone (ALDACTONE) 25 MG tablet      folic acid (FOLVITE) 1 MG tablet      thiamine  100 MG tablet        No Known Allergies    The results of significant diagnostics from this hospitalization (including imaging, microbiology, ancillary and laboratory) are listed below for reference.    Significant Diagnostic Studies: US Renal  08/10/2014   CLINICAL DATA:  Acute renal failure  EXAM: RENAL / URINARY TRACT ULTRASOUND COMPLETE  COMPARISON:  None.  FINDINGS: Right Kidney:  Length: 12 cm. Echogenicity within normal limits. No mass or hydronephrosis visualized.  Left Kidney:  Length: 11.5 cm. Echogenicity within normal limits. No mass or hydronephrosis visualized.  Bladder:  Appears normal for degree of bladder distention.  IMPRESSION: Normal renal ultrasound   Electronically Signed   By: Skipper Cliche M.D.   On: 08/10/2014 21:44    Microbiology: No results found for this or any  previous visit (from the past 240 hour(s)).   Labs: Basic Metabolic Panel:  Recent Labs Lab 08/11/14 0318 08/11/14 0903 08/11/14 1658 08/11/14 2102 08/12/14 0340  NA 127* 127* 129* 128* 129*  K 5.2* 4.6 5.3* 5.6* 5.3*  CL 94* 94* 96* 94* 96*  CO2 _0 GLUCOSE 335* 413* 353* 374* 373*  BUN 37* 31* 31* 31* 30*  CREATININE 1.35* 1.25* 1.27* 1.39* 1.07  CALCIUM 9.0 9.1 9.2 9.1 9.5   Liver Function Tests:  Recent Labs Lab 08/10/14 0345 08/10/14 2352  AST 21 16  ALT 13 14  ALKPHOS 119* 143*  BILITOT 0.4 0.6  PROT 7.8 8.5*  ALBUMIN 2.8* 3.2*   No results for input(s): LIPASE, AMYLASE in the last 168 hours. No results for input(s): AMMONIA in the last 168 hours. CBC:  Recent Labs Lab 08/10/14 0731 08/10/14 2352  WBC 9.0 11.3*  NEUTROABS  --  8.4*  HGB 12.5* 13.2  HCT 35.5* 36.6*  MCV 80.3 80.4  PLT 320 360   Cardiac Enzymes: No results for input(s): CKTOTAL, CKMB, CKMBINDEX, TROPONINI in the last 168 hours. BNP: BNP (last 3 results)  Recent Labs  04/06/14 1252  BNP 1329.3*    ProBNP (last 3 results)  Recent Labs  09/22/13 0344  PROBNP 1845.0*     CBG:  Recent Labs Lab 08/11/14 1612 08/11/14 1953 08/12/14 0732 08/12/14 1058 08/12/14 1248  GLUCAP 366* 351* 419* 404* 311*       Signed:  Velvet Bathe  [facility] 08/12/2014, 4:02 PM

## 2014-10-01 DIAGNOSIS — I509 Heart failure, unspecified: Secondary | ICD-10-CM | POA: Insufficient documentation

## 2015-06-20 ENCOUNTER — Encounter (HOSPITAL_BASED_OUTPATIENT_CLINIC_OR_DEPARTMENT_OTHER): Payer: Self-pay

## 2015-06-20 ENCOUNTER — Encounter (HOSPITAL_BASED_OUTPATIENT_CLINIC_OR_DEPARTMENT_OTHER)
Admission: RE | Disposition: A | Discharge status: Home / Self Care | Payer: Self-pay | Source: Ambulatory Visit | Attending: Orthopedic Surgery

## 2015-06-20 ENCOUNTER — Ambulatory Visit (HOSPITAL_BASED_OUTPATIENT_CLINIC_OR_DEPARTMENT_OTHER)
Admission: RE | Admit: 2015-06-20 | Discharge: 2015-06-20 | Disposition: A | Discharge status: Home / Self Care | Payer: 59 | Source: Ambulatory Visit | Attending: Orthopedic Surgery | Admitting: Orthopedic Surgery

## 2015-06-20 ENCOUNTER — Other Ambulatory Visit: Payer: Self-pay | Admitting: Orthopedic Surgery

## 2015-06-20 DIAGNOSIS — Z9111 Patient's noncompliance with dietary regimen: Secondary | ICD-10-CM | POA: Insufficient documentation

## 2015-06-20 DIAGNOSIS — J449 Chronic obstructive pulmonary disease, unspecified: Secondary | ICD-10-CM | POA: Insufficient documentation

## 2015-06-20 DIAGNOSIS — L03012 Cellulitis of left finger: Secondary | ICD-10-CM | POA: Diagnosis not present

## 2015-06-20 DIAGNOSIS — E669 Obesity, unspecified: Secondary | ICD-10-CM | POA: Diagnosis not present

## 2015-06-20 DIAGNOSIS — Z79899 Other long term (current) drug therapy: Secondary | ICD-10-CM | POA: Insufficient documentation

## 2015-06-20 DIAGNOSIS — Z794 Long term (current) use of insulin: Secondary | ICD-10-CM | POA: Diagnosis not present

## 2015-06-20 DIAGNOSIS — E785 Hyperlipidemia, unspecified: Secondary | ICD-10-CM | POA: Diagnosis not present

## 2015-06-20 DIAGNOSIS — E1165 Type 2 diabetes mellitus with hyperglycemia: Secondary | ICD-10-CM | POA: Diagnosis not present

## 2015-06-20 DIAGNOSIS — I10 Essential (primary) hypertension: Secondary | ICD-10-CM | POA: Diagnosis not present

## 2015-06-20 DIAGNOSIS — Z7982 Long term (current) use of aspirin: Secondary | ICD-10-CM | POA: Insufficient documentation

## 2015-06-20 DIAGNOSIS — F17210 Nicotine dependence, cigarettes, uncomplicated: Secondary | ICD-10-CM | POA: Insufficient documentation

## 2015-06-20 DIAGNOSIS — I509 Heart failure, unspecified: Secondary | ICD-10-CM | POA: Diagnosis not present

## 2015-06-20 HISTORY — PX: MINOR IRRIGATION AND DEBRIDEMENT OF WOUND: SHX6239

## 2015-06-20 LAB — GLUCOSE, CAPILLARY: Glucose-Capillary: 373 mg/dL — ABNORMAL HIGH (ref 65–99)

## 2015-06-20 SURGERY — MINOR IRRIGATION AND DEBRIDEMENT OF WOUND
Anesthesia: LOCAL | Site: Finger | Laterality: Left

## 2015-06-20 MED ORDER — HYDROCODONE-ACETAMINOPHEN 5-325 MG PO TABS: ORAL_TABLET | ORAL | Status: DC

## 2015-06-20 MED ORDER — SULFAMETHOXAZOLE-TRIMETHOPRIM 800-160 MG PO TABS: 1.0000 | ORAL_TABLET | Freq: Two times a day (BID) | ORAL | Status: DC

## 2015-06-20 MED ORDER — BUPIVACAINE HCL (PF) 0.25 % IJ SOLN
INTRAMUSCULAR | Status: DC | PRN
  Administered 2015-06-20: 10 mL

## 2015-06-20 MED ORDER — CHLORHEXIDINE GLUCONATE 4 % EX LIQD
60.0000 mL | Freq: Once | CUTANEOUS | Status: DC
Start: 2015-06-20 — End: 2015-06-20

## 2015-06-20 MED ORDER — LIDOCAINE HCL (PF) 1 % IJ SOLN
INTRAMUSCULAR | Status: AC
  Filled 2015-06-20: qty 30

## 2015-06-20 MED ORDER — LIDOCAINE HCL 1 % IJ SOLN
INTRAMUSCULAR | Status: DC | PRN
  Administered 2015-06-20: 10 mL

## 2015-06-20 SURGICAL SUPPLY — 50 items
BAG DECANTER FOR FLEXI CONT (MISCELLANEOUS) IMPLANT
BANDAGE ACE 3X5.8 VEL STRL LF (GAUZE/BANDAGES/DRESSINGS) IMPLANT
BLADE MINI RND TIP GREEN BEAV (BLADE) IMPLANT
BLADE SURG 15 STRL LF DISP TIS (BLADE) ×4 IMPLANT
BLADE SURG 15 STRL SS (BLADE) ×2
BNDG COHESIVE 1X5 TAN STRL LF (GAUZE/BANDAGES/DRESSINGS) ×9 IMPLANT
BNDG ELASTIC 2X5.8 VLCR STR LF (GAUZE/BANDAGES/DRESSINGS) IMPLANT
BNDG ESMARK 4X9 LF (GAUZE/BANDAGES/DRESSINGS) IMPLANT
BNDG GAUZE 1X2.1 STRL (MISCELLANEOUS) IMPLANT
BNDG GAUZE ELAST 4 BULKY (GAUZE/BANDAGES/DRESSINGS) IMPLANT
BRUSH SCRUB EZ PLAIN DRY (MISCELLANEOUS) ×3 IMPLANT
CHLORAPREP W/TINT 26ML (MISCELLANEOUS) ×3 IMPLANT
CORDS BIPOLAR (ELECTRODE) IMPLANT
COVER BACK TABLE 60X90IN (DRAPES) ×3 IMPLANT
COVER MAYO STAND STRL (DRAPES) ×3 IMPLANT
CUFF TOURNIQUET SINGLE 18IN (TOURNIQUET CUFF) IMPLANT
DRAPE EXTREMITY T 121X128X90 (DRAPE) ×3 IMPLANT
DRAPE SURG 17X23 STRL (DRAPES) ×3 IMPLANT
GAUZE PACKING IODOFORM 1/4X15 (GAUZE/BANDAGES/DRESSINGS) ×3 IMPLANT
GAUZE SPONGE 4X4 12PLY STRL (GAUZE/BANDAGES/DRESSINGS) ×3 IMPLANT
GAUZE XEROFORM 1X8 LF (GAUZE/BANDAGES/DRESSINGS) ×3 IMPLANT
GLOVE BIO SURGEON STRL SZ7 (GLOVE) ×3 IMPLANT
GLOVE BIO SURGEON STRL SZ7.5 (GLOVE) ×3 IMPLANT
GLOVE BIOGEL PI IND STRL 7.5 (GLOVE) ×2 IMPLANT
GLOVE BIOGEL PI IND STRL 8 (GLOVE) ×2 IMPLANT
GLOVE BIOGEL PI INDICATOR 7.5 (GLOVE) ×1
GLOVE BIOGEL PI INDICATOR 8 (GLOVE) ×1
GOWN STRL REUS W/ TWL LRG LVL3 (GOWN DISPOSABLE) ×2 IMPLANT
GOWN STRL REUS W/TWL LRG LVL3 (GOWN DISPOSABLE) ×1
GOWN STRL REUS W/TWL XL LVL3 (GOWN DISPOSABLE) ×3 IMPLANT
LOOP VESSEL MAXI BLUE (MISCELLANEOUS) IMPLANT
NEEDLE HYPO 25X1 1.5 SAFETY (NEEDLE) IMPLANT
NS IRRIG 1000ML POUR BTL (IV SOLUTION) ×3 IMPLANT
PACK BASIN DAY SURGERY FS (CUSTOM PROCEDURE TRAY) ×3 IMPLANT
PAD CAST 3X4 CTTN HI CHSV (CAST SUPPLIES) IMPLANT
PADDING CAST ABS 4INX4YD NS (CAST SUPPLIES) ×1
PADDING CAST ABS COTTON 4X4 ST (CAST SUPPLIES) ×2 IMPLANT
PADDING CAST COTTON 3X4 STRL (CAST SUPPLIES)
SPLINT PLASTER CAST XFAST 3X15 (CAST SUPPLIES) IMPLANT
SPLINT PLASTER XTRA FASTSET 3X (CAST SUPPLIES)
STOCKINETTE 4X48 STRL (DRAPES) ×3 IMPLANT
SUT ETHILON 4 0 PS 2 18 (SUTURE) IMPLANT
SWAB COLLECTION DEVICE MRSA (MISCELLANEOUS) IMPLANT
SWAB CULTURE ESWAB REG 1ML (MISCELLANEOUS) IMPLANT
SYR BULB 3OZ (MISCELLANEOUS) ×3 IMPLANT
SYR CONTROL 10ML LL (SYRINGE) IMPLANT
TOWEL OR 17X24 6PK STRL BLUE (TOWEL DISPOSABLE) ×3 IMPLANT
TRAY DSU PREP LF (CUSTOM PROCEDURE TRAY) ×3 IMPLANT
TUBE FEEDING 5FR 15 INCH (TUBING) IMPLANT
UNDERPAD 30X30 (UNDERPADS AND DIAPERS) ×3 IMPLANT

## 2015-06-20 NOTE — Discharge Instructions (Signed)

## 2015-06-20 NOTE — Brief Op Note (Signed)
06/20/2015  4:11 PM  PATIENT:  Austin Alexander  45 y.o. male  PRE-OPERATIVE DIAGNOSIS:  Left index felon and paronchia   POST-OPERATIVE DIAGNOSIS:  Left index felon and paronchia  PROCEDURE:  Procedure(s): IRRIGATION AND DEBRIDEMENT Wound with nailbed repair (Left)  SURGEON:  Surgeon(s) and Role:    * Leanora Cover, MD - Primary  PHYSICIAN ASSISTANT:   ASSISTANTS: none   ANESTHESIA:   local  EBL:     BLOOD ADMINISTERED:none  DRAINS: iodoform packing  LOCAL MEDICATIONS USED:  MARCAINE    and LIDOCAINE   SPECIMEN:  Source of Specimen:  left index finger  DISPOSITION OF SPECIMEN:  micro  COUNTS:  YES  TOURNIQUET:    DICTATION: .Other Dictation: Dictation Number [id number]  PLAN OF CARE: Discharge to home after PACU  PATIENT DISPOSITION:  PACU - hemodynamically stable.

## 2015-06-20 NOTE — H&P (Signed)
Austin Alexander is an 45 y.o. male.   Chief Complaint: left index finger infection HPI: 46 yo rhd male states left index finger began to have issues ~ 3 days ago.  Has progressively become more swollen and painful.  No fevers, chills, night sweats. Started as a hangnail.  Allergies: No Known Allergies  Past Medical History  Diagnosis Date  . Bronchitis   . Croup   . Tobacco abuse   . Dizziness   . Abscess of perineum   . Heartburn   . Indigestion   . H/O folliculitis   . 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 Alcohol abuse   . Hypertension     variable in nature  . Hyperlipidemia   . Multiple excoriations     multifactorial in origin  . Mild obesity   . Dietary noncompliance     History of,  . Situational stress     secondary to financial issues  . Erectile dysfunction     Multifactorial. Improved with Cialis. New onset.  . Cystic acne     with acne keloidosis  . Diabetes mellitus without complication (HCC)     Type 2, uncontrolled with questionable improvement  . 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 EKG, abnormal     History of, with negative cardiac evaluation by history.  [name] COPD (chronic obstructive pulmonary disease) (HCC)     moderate obstruction with low vital capacity  . Asthma     History of,  . CHF (congestive heart failure) [facility])     Past Surgical History  Procedure Laterality Date  . Incision and drainage perirectal abscess N/A 02/17/2014    Procedure: IRRIGATION AND DEBRIDEMENT PERIRECTAL  AND SCROTAL ABSCESS;  Surgeon: Coralie Keens, MD;  Location: [place];  Service: General;  Laterality: N/A;  . I&d extremity Left 06/28/2014    Procedure: IYMEBRAXEN AND DEBRIDEMENT EXTREMITY;  Surgeon: Leanora Cover, MD;  Location: [place];  Service: Orthopedics;  Laterality: Left;    Family History: Family History  Problem Relation Age of Onset  . Diabetes type II Mother   . Hypertension Mother     Social History:   reports  that he has been smoking Cigarettes.  He has a 20 pack-year smoking history. He does not have any smokeless tobacco history on file. He reports that he drinks about 7.2 oz of alcohol per week. He reports that he does not use illicit drugs.  Medications: Medications Prior to Admission  Medication Sig Dispense Refill  . aspirin EC 81 MG EC tablet Take 1 tablet (81 mg total) by mouth daily. 30 tablet 6  . carvedilol (COREG) 25 MG tablet Take 25 mg by mouth 2 (two) times daily with a meal.    . digoxin (LANOXIN) 0.25 MG tablet Take 0.25 mg by mouth daily.    . furosemide (LASIX) 40 MG tablet Take 1 tablet (40 mg total) by mouth daily. (Patient taking differently: Take 80 mg by mouth daily. ) 30 tablet 0  . glimepiride (AMARYL) 4 MG tablet Take 4 mg by mouth daily.  0  . insulin detemir (LEVEMIR) 100 UNIT/ML injection Inject 0.15 mLs (15 Units total) into the skin daily. 10 mL 0  . ivabradine (CORLANOR) 5 MG TABS tablet Take 5 mg by mouth daily.    . Multiple Vitamins-Minerals (CENTRUM ADULTS PO) Take 1 tablet by mouth daily.    . simvastatin (ZOCOR) 40 MG tablet Take 1 tablet (  40 mg total) by mouth daily. 30 tablet 0  . blood glucose meter kit and supplies Dispense based on patient and insurance preference. Use up to four times daily as directed. (FOR ICD-9 250.00, 250.01). 1 each 0    No results found for this or any previous visit (from the past 48 hour(s)).  No results found.   A comprehensive review of systems was negative.   Blood pressure 149/94, pulse 84, temperature 98.3 F (36.8 C), temperature source Oral, resp. rate 18, SpO2 96 %.  General appearance: alert, cooperative and appears stated age Head: Normocephalic, without obvious abnormality, atraumatic Neck: supple, symmetrical, trachea midline Resp: clear to auscultation bilaterally Cardio: regular rate and rhythm GI: non-tender Extremities: Intact sensation and capillary refill all digits.  +epl/fpl/io.  No wounds.   Swelling and fluctuance of distal phalanx including dorsal nail fold. Pulses: 2+ and symmetric Skin: Skin color, texture, turgor normal. No rashes or lesions Neurologic: Grossly normal Incision/Wound: none  Assessment/Plan Left index finger felon/paronychia.  Recommend incision and drainage.  Risks, benefits, and alternatives of surgery were discussed and the patient agrees with the plan of care.   KUZMA,KEVIN R 06/20/2015, 3:15 PM   

## 2015-06-20 NOTE — Op Note (Signed)
[id number] 

## 2015-06-21 NOTE — Op Note (Signed)
NAME:  [name], [name] NO.:  [id number]  MEDICAL RECORD NO.:  [id number]  LOCATION:                                 FACILITY:  PHYSICIAN:  Leanora Cover, MD             DATE OF BIRTH:  DATE OF PROCEDURE:  06/20/2015 DATE OF DISCHARGE:                              OPERATIVE REPORT   PREOPERATIVE DIAGNOSIS:  Left index finger felon and paronychia.  POSTOPERATIVE DIAGNOSIS:  Left index finger felon and paronychia.  PROCEDURE:   1. Left index finger incision and drainage of felon and paronychia  2. Removal of nail plate left index finger  SURGEON:  Leanora Cover, MD  ASSISTANT:  None.  ANESTHESIA:  Digital block with 10 mL of half and half solution of 1% plain lidocaine and 0.25% plain Marcaine.  TOURNIQUET TIME:  Penrose drain, approximately 20 minutes.  DISPOSITION:  Stable.  INDICATIONS:  [name] is a 45 year old right-hand dominant male who states for the past 3 or 4 days, he has been having increasing pain and swelling of the left index finger.  This was started with a hangnail. It has gotten worse and presented to the office this morning.  I recommended incision and drainage.  Risks, benefits and alternatives of the surgery were discussed including the risk of blood loss; infection; damage to nerves, vessels, tendons, ligaments, bone; failure of surgery; need for additional surgery; complications with wound healing; continued pain; continued infection; need for repeat irrigation and debridement and nail deformity.  He voiced understanding of these risks and elected to proceed.  OPERATIVE COURSE:  After being identified preoperatively by myself, the patient and I agreed upon the procedure and site of procedure.  The surgical site was marked.  The risks, benefits, and alternatives of the surgery were reviewed and he wished to proceed.  Surgical consent had been signed.  He was transferred to the operating room and placed on the operating room table  in supine position with the left upper extremity on an armboard.  A surgical pause was performed between the surgeons, operating staff, and patient, all were in agreement as to the patient, procedure, and site of procedure.  Digital block was performed.  A 10 mL of half and half solution of 1% plain lidocaine and 0.25% plain Marcaine.  This was adequate to give digital anesthesia to the left index finger.  The Penrose drain was used as a tourniquet, it was up for approximately 20 minutes.  The left upper extremity had been prepped and draped in normal sterile orthopedic fashion.  A surgical pause was again performed by the surgeons and operating room staff, all were in agreement as to the patient, procedure, and site of procedure.  Freer elevator was used to push back the cuticle.  Gross purulence was encountered.  Cultures were taken for aerobes and anaerobes.  The nail was removed.  There was gross purulence from the nail fold and coursing down on the radial gutter.  An incision was made at the ulnar side of the distal phalanx and carried into subcutaneous tissues by spreading technique.  The septi of the distal phalanx were separated using  the scissors.  Spreading technique was used.  The gutter on the radial side of the finger communicated with the pad of the finger.  There was gross purulence within the pad of the finger.  The wounds were copiously irrigated with sterile saline.  They were packed with 0.25-inch iodoform gauze.  A piece of Xeroform was placed in the nail fold, then the wound dressed with sterile Xeroform.  The Penrose drain was removed.  Direct pressure was used to control bleeding.  The wounds were dressed with sterile 4x4s and wrapped with a Coban dressing lightly.  An AlumaFoam splint was placed and wrapped lightly with the Coban dressing.  The Penrose drain had been removed.  The patient tolerated the procedure well.  I will see him back in the office in 3 days for  postoperative followup.  I will give him Norco 5/325, 1-2 p.o. q.6 hours p.r.n. pain, dispensed #30 and Bactrim DS 1 p.o. b.i.d. x7 days.     Leanora Cover, MD     KK/MEDQ  D:  06/20/2015  T:  06/21/2015  Job:  [id number]

## 2015-06-23 ENCOUNTER — Encounter (HOSPITAL_BASED_OUTPATIENT_CLINIC_OR_DEPARTMENT_OTHER): Payer: Self-pay | Admitting: Orthopedic Surgery

## 2015-06-23 LAB — WOUND CULTURE

## 2015-06-25 LAB — ANAEROBIC CULTURE

## 2015-07-22 LAB — FUNGUS CULTURE RESULT

## 2015-07-22 LAB — FUNGUS CULTURE WITH STAIN

## 2015-07-22 LAB — FUNGAL ORGANISM REFLEX

## 2015-08-18 ENCOUNTER — Encounter (HOSPITAL_COMMUNITY): Payer: Self-pay | Admitting: Emergency Medicine

## 2015-08-18 ENCOUNTER — Emergency Department (HOSPITAL_COMMUNITY): Payer: 59

## 2015-08-18 ENCOUNTER — Observation Stay (HOSPITAL_COMMUNITY)
Admission: EM | Admit: 2015-08-18 | Discharge: 2015-08-20 | Disposition: A | Discharge status: Home / Self Care | Payer: 59 | Attending: Oncology | Admitting: Oncology

## 2015-08-18 DIAGNOSIS — L509 Urticaria, unspecified: Secondary | ICD-10-CM | POA: Insufficient documentation

## 2015-08-18 DIAGNOSIS — E871 Hypo-osmolality and hyponatremia: Secondary | ICD-10-CM | POA: Diagnosis not present

## 2015-08-18 DIAGNOSIS — I5022 Chronic systolic (congestive) heart failure: Secondary | ICD-10-CM | POA: Diagnosis not present

## 2015-08-18 DIAGNOSIS — F17210 Nicotine dependence, cigarettes, uncomplicated: Secondary | ICD-10-CM | POA: Diagnosis not present

## 2015-08-18 DIAGNOSIS — N179 Acute kidney failure, unspecified: Secondary | ICD-10-CM

## 2015-08-18 DIAGNOSIS — I420 Dilated cardiomyopathy: Secondary | ICD-10-CM

## 2015-08-18 DIAGNOSIS — E875 Hyperkalemia: Secondary | ICD-10-CM | POA: Diagnosis not present

## 2015-08-18 DIAGNOSIS — T783XXA Angioneurotic edema, initial encounter: Principal | ICD-10-CM

## 2015-08-18 DIAGNOSIS — Z794 Long term (current) use of insulin: Secondary | ICD-10-CM | POA: Diagnosis not present

## 2015-08-18 DIAGNOSIS — E118 Type 2 diabetes mellitus with unspecified complications: Secondary | ICD-10-CM

## 2015-08-18 DIAGNOSIS — E860 Dehydration: Secondary | ICD-10-CM | POA: Diagnosis not present

## 2015-08-18 DIAGNOSIS — E1110 Type 2 diabetes mellitus with ketoacidosis without coma: Secondary | ICD-10-CM

## 2015-08-18 DIAGNOSIS — I110 Hypertensive heart disease with heart failure: Secondary | ICD-10-CM | POA: Diagnosis not present

## 2015-08-18 DIAGNOSIS — Z7982 Long term (current) use of aspirin: Secondary | ICD-10-CM | POA: Diagnosis not present

## 2015-08-18 DIAGNOSIS — I959 Hypotension, unspecified: Secondary | ICD-10-CM | POA: Insufficient documentation

## 2015-08-18 DIAGNOSIS — E1165 Type 2 diabetes mellitus with hyperglycemia: Secondary | ICD-10-CM

## 2015-08-18 DIAGNOSIS — T7840XA Allergy, unspecified, initial encounter: Secondary | ICD-10-CM

## 2015-08-18 DIAGNOSIS — N189 Chronic kidney disease, unspecified: Secondary | ICD-10-CM | POA: Diagnosis present

## 2015-08-18 DIAGNOSIS — L299 Pruritus, unspecified: Secondary | ICD-10-CM | POA: Insufficient documentation

## 2015-08-18 DIAGNOSIS — Z720 Tobacco use: Secondary | ICD-10-CM | POA: Diagnosis present

## 2015-08-18 DIAGNOSIS — I10 Essential (primary) hypertension: Secondary | ICD-10-CM | POA: Diagnosis present

## 2015-08-18 DIAGNOSIS — R739 Hyperglycemia, unspecified: Secondary | ICD-10-CM | POA: Diagnosis present

## 2015-08-18 DIAGNOSIS — E1101 Type 2 diabetes mellitus with hyperosmolarity with coma: Secondary | ICD-10-CM | POA: Diagnosis present

## 2015-08-18 DIAGNOSIS — E119 Type 2 diabetes mellitus without complications: Secondary | ICD-10-CM

## 2015-08-18 LAB — I-STAT CG4 LACTIC ACID, ED: Lactic Acid, Venous: 2.88 mmol/L (ref 0.5–2.0)

## 2015-08-18 LAB — COMPREHENSIVE METABOLIC PANEL
ALT: 15 U/L — ABNORMAL LOW (ref 17–63)
AST: 19 U/L (ref 15–41)
Albumin: 2.8 g/dL — ABNORMAL LOW (ref 3.5–5.0)
Alkaline Phosphatase: 69 U/L (ref 38–126)
Anion gap: 14 (ref 5–15)
BUN: 52 mg/dL — ABNORMAL HIGH (ref 6–20)
CO2: 20 mmol/L — ABNORMAL LOW (ref 22–32)
Calcium: 8.5 mg/dL — ABNORMAL LOW (ref 8.9–10.3)
Chloride: 81 mmol/L — ABNORMAL LOW (ref 101–111)
Creatinine, Ser: 4.96 mg/dL — ABNORMAL HIGH (ref 0.61–1.24)
GFR calc Af Amer: 15 mL/min — ABNORMAL LOW (ref >60)
GFR calc non Af Amer: 13 mL/min — ABNORMAL LOW (ref >60)
Glucose, Bld: 577 mg/dL (ref 65–99)
Potassium: 6.2 mmol/L (ref 3.5–5.1)
Sodium: 115 mmol/L — CL (ref 135–145)
Total Bilirubin: 1.2 mg/dL (ref 0.3–1.2)
Total Protein: 6.4 g/dL — ABNORMAL LOW (ref 6.5–8.1)

## 2015-08-18 LAB — BASIC METABOLIC PANEL
ANION GAP: 14 (ref 5–15)
Anion gap: 9 (ref 5–15)
Anion gap: 14 (ref 5–15)
BUN: 52 mg/dL — AB (ref 6–20)
BUN: 54 mg/dL — ABNORMAL HIGH (ref 6–20)
BUN: 60 mg/dL — ABNORMAL HIGH (ref 6–20)
CALCIUM: 8.1 mg/dL — AB (ref 8.9–10.3)
CALCIUM: 7.8 mg/dL — AB (ref 8.9–10.3)
CHLORIDE: 90 mmol/L — AB (ref 101–111)
CO2: 20 mmol/L — ABNORMAL LOW (ref 22–32)
CO2: 20 mmol/L — ABNORMAL LOW (ref 22–32)
CO2: 18 mmol/L — AB (ref 22–32)
CREATININE: 4.29 mg/dL — AB (ref 0.61–1.24)
Calcium: 8.5 mg/dL — ABNORMAL LOW (ref 8.9–10.3)
Chloride: 93 mmol/L — ABNORMAL LOW (ref 101–111)
Chloride: 90 mmol/L — ABNORMAL LOW (ref 101–111)
Creatinine, Ser: 4.16 mg/dL — ABNORMAL HIGH (ref 0.61–1.24)
Creatinine, Ser: 4.23 mg/dL — ABNORMAL HIGH (ref 0.61–1.24)
GFR calc Af Amer: 18 mL/min — ABNORMAL LOW (ref >60)
GFR calc Af Amer: 18 mL/min — ABNORMAL LOW (ref >60)
GFR calc non Af Amer: 16 mL/min — ABNORMAL LOW (ref >60)
GFR calc non Af Amer: 15 mL/min — ABNORMAL LOW (ref >60)
GFR calc non Af Amer: 15 mL/min — ABNORMAL LOW (ref >60)
GFR, EST AFRICAN AMERICAN: 18 mL/min — AB (ref >60)
GLUCOSE: 225 mg/dL — AB (ref 65–99)
GLUCOSE: 405 mg/dL — AB (ref 65–99)
Glucose, Bld: 165 mg/dL — ABNORMAL HIGH (ref 65–99)
POTASSIUM: 5.8 mmol/L — AB (ref 3.5–5.1)
POTASSIUM: 5.9 mmol/L — AB (ref 3.5–5.1)
Potassium: 5.8 mmol/L — ABNORMAL HIGH (ref 3.5–5.1)
SODIUM: 124 mmol/L — AB (ref 135–145)
Sodium: 122 mmol/L — ABNORMAL LOW (ref 135–145)
Sodium: 122 mmol/L — ABNORMAL LOW (ref 135–145)

## 2015-08-18 LAB — GLUCOSE, CAPILLARY
Glucose-Capillary: 289 mg/dL — ABNORMAL HIGH (ref 65–99)
Glucose-Capillary: 277 mg/dL — ABNORMAL HIGH (ref 65–99)
Glucose-Capillary: 222 mg/dL — ABNORMAL HIGH (ref 65–99)
Glucose-Capillary: 199 mg/dL — ABNORMAL HIGH (ref 65–99)
Glucose-Capillary: 156 mg/dL — ABNORMAL HIGH (ref 65–99)
Glucose-Capillary: 383 mg/dL — ABNORMAL HIGH (ref 65–99)

## 2015-08-18 LAB — CBG MONITORING, ED
GLUCOSE-CAPILLARY: 567 mg/dL — AB (ref 65–99)
GLUCOSE-CAPILLARY: 510 mg/dL — AB (ref 65–99)
GLUCOSE-CAPILLARY: 364 mg/dL — AB (ref 65–99)
Glucose-Capillary: 589 mg/dL (ref 65–99)
Glucose-Capillary: 465 mg/dL — ABNORMAL HIGH (ref 65–99)
Glucose-Capillary: 390 mg/dL — ABNORMAL HIGH (ref 65–99)

## 2015-08-18 LAB — URINALYSIS, ROUTINE W REFLEX MICROSCOPIC
BILIRUBIN URINE: NEGATIVE
Glucose, UA: >1000 mg/dL — AB
Ketones, ur: NEGATIVE mg/dL
LEUKOCYTES UA: NEGATIVE
NITRITE: NEGATIVE
PH: 5 (ref 5.0–8.0)
Protein, ur: 30 mg/dL — AB
SPECIFIC GRAVITY, URINE: 1.023 (ref 1.005–1.030)

## 2015-08-18 LAB — I-STAT VENOUS BLOOD GAS, ED
Acid-base deficit: 3 mmol/L — ABNORMAL HIGH (ref 0.0–2.0)
BICARBONATE: 21.8 meq/L (ref 20.0–24.0)
O2 SAT: 51 %
PCO2 VEN: 38.6 mmHg — AB (ref 45.0–50.0)
TCO2: 23 mmol/L (ref 0–100)
pH, Ven: 7.359 — ABNORMAL HIGH (ref 7.250–7.300)
pO2, Ven: 28 mmHg — ABNORMAL LOW (ref 31.0–45.0)

## 2015-08-18 LAB — CBC WITH DIFFERENTIAL/PLATELET
Basophils Absolute: 0 10*3/uL (ref 0.0–0.1)
Basophils Relative: 0 %
Eosinophils Absolute: 0.3 10*3/uL (ref 0.0–0.7)
Eosinophils Relative: 3 %
HCT: 40.2 % (ref 39.0–52.0)
Hemoglobin: 14.2 g/dL (ref 13.0–17.0)
Lymphocytes Relative: 17 %
Lymphs Abs: 1.5 10*3/uL (ref 0.7–4.0)
MCH: 29.5 pg (ref 26.0–34.0)
MCHC: 35.3 g/dL (ref 30.0–36.0)
MCV: 83.6 fL (ref 78.0–100.0)
Monocytes Absolute: 0.4 10*3/uL (ref 0.1–1.0)
Monocytes Relative: 4 %
Neutro Abs: 7 10*3/uL (ref 1.7–7.7)
Neutrophils Relative %: 76 %
Platelets: 370 10*3/uL (ref 150–400)
RBC: 4.81 MIL/uL (ref 4.22–5.81)
RDW: 13 % (ref 11.5–15.5)
WBC: 9.2 10*3/uL (ref 4.0–10.5)

## 2015-08-18 LAB — URINE MICROSCOPIC-ADD ON

## 2015-08-18 LAB — I-STAT ARTERIAL BLOOD GAS, ED
Acid-base deficit: 5 mmol/L — ABNORMAL HIGH (ref 0.0–2.0)
Bicarbonate: 19.8 mEq/L — ABNORMAL LOW (ref 20.0–24.0)
O2 Saturation: 93 %
PCO2 ART: 34.5 mmHg — AB (ref 35.0–45.0)
PH ART: 7.366 (ref 7.350–7.450)
TCO2: 21 mmol/L (ref 0–100)
pO2, Arterial: 70 mmHg — ABNORMAL LOW (ref 80.0–100.0)

## 2015-08-18 LAB — LACTIC ACID, PLASMA
LACTIC ACID, VENOUS: 3.2 mmol/L — AB (ref 0.5–2.0)
LACTIC ACID, VENOUS: 3 mmol/L — AB (ref 0.5–2.0)
Lactic Acid, Venous: 2 mmol/L (ref 0.5–2.0)

## 2015-08-18 LAB — RAPID URINE DRUG SCREEN, HOSP PERFORMED
Amphetamines: NOT DETECTED
Barbiturates: NOT DETECTED
Benzodiazepines: NOT DETECTED
Cocaine: NOT DETECTED
Opiates: NOT DETECTED
Tetrahydrocannabinol: NOT DETECTED

## 2015-08-18 LAB — ETHANOL

## 2015-08-18 LAB — MRSA PCR SCREENING: MRSA by PCR: NEGATIVE

## 2015-08-18 LAB — TRIGLYCERIDES: TRIGLYCERIDES: 392 mg/dL — AB (ref <150)

## 2015-08-18 LAB — MAGNESIUM: Magnesium: 2.1 mg/dL (ref 1.7–2.4)

## 2015-08-18 LAB — TROPONIN I: Troponin I: <0.03 ng/mL (ref <0.031)

## 2015-08-18 LAB — LIPASE, BLOOD: Lipase: 18 U/L (ref 11–51)

## 2015-08-18 LAB — OSMOLALITY: Osmolality: 290 mOsm/kg (ref 275–295)

## 2015-08-18 MED ORDER — DIPHENHYDRAMINE HCL 25 MG PO CAPS
25.0000 mg | ORAL_CAPSULE | Freq: Three times a day (TID) | ORAL | Status: DC | PRN
  Administered 2015-08-20: 25 mg via ORAL
  Filled 2015-08-18: qty 1

## 2015-08-18 MED ORDER — INSULIN ASPART 100 UNIT/ML ~~LOC~~ SOLN
0.0000 [IU] | Freq: Every day | SUBCUTANEOUS | Status: DC
  Administered 2015-08-18: 5 [IU] via SUBCUTANEOUS

## 2015-08-18 MED ORDER — CARVEDILOL 25 MG PO TABS
25.0000 mg | ORAL_TABLET | Freq: Two times a day (BID) | ORAL | Status: DC
  Administered 2015-08-18 – 2015-08-20 (×4): 25 mg via ORAL
  Filled 2015-08-18 (×4): qty 1

## 2015-08-18 MED ORDER — HEPARIN SODIUM (PORCINE) 5000 UNIT/ML IJ SOLN
5000.0000 [IU] | Freq: Three times a day (TID) | INTRAMUSCULAR | Status: DC
  Administered 2015-08-18 – 2015-08-20 (×7): 5000 [IU] via SUBCUTANEOUS
  Filled 2015-08-18 (×7): qty 1

## 2015-08-18 MED ORDER — EPINEPHRINE 0.3 MG/0.3ML IJ SOAJ
0.3000 mg | Freq: Once | INTRAMUSCULAR | Status: AC
  Administered 2015-08-18: 0.3 mg via INTRAMUSCULAR
  Filled 2015-08-18: qty 0.3

## 2015-08-18 MED ORDER — INSULIN ASPART 100 UNIT/ML ~~LOC~~ SOLN: 0.0000 [IU] | Freq: Three times a day (TID) | SUBCUTANEOUS | Status: DC

## 2015-08-18 MED ORDER — SODIUM CHLORIDE 0.9% FLUSH
3.0000 mL | Freq: Two times a day (BID) | INTRAVENOUS | Status: DC
  Administered 2015-08-18 – 2015-08-20 (×4): 3 mL via INTRAVENOUS

## 2015-08-18 MED ORDER — SODIUM CHLORIDE 0.9% FLUSH: 3.0000 mL | INTRAVENOUS | Status: DC | PRN

## 2015-08-18 MED ORDER — METHYLPREDNISOLONE SODIUM SUCC 125 MG IJ SOLR
125.0000 mg | Freq: Once | INTRAMUSCULAR | Status: AC
  Administered 2015-08-18: 125 mg via INTRAVENOUS
  Filled 2015-08-18: qty 2

## 2015-08-18 MED ORDER — ASPIRIN EC 81 MG PO TBEC
81.0000 mg | DELAYED_RELEASE_TABLET | Freq: Every day | ORAL | Status: DC
  Administered 2015-08-19 – 2015-08-20 (×2): 81 mg via ORAL
  Filled 2015-08-18 (×2): qty 1

## 2015-08-18 MED ORDER — DIGOXIN 250 MCG PO TABS
0.2500 mg | ORAL_TABLET | Freq: Every day | ORAL | Status: DC
  Administered 2015-08-19 – 2015-08-20 (×2): 0.25 mg via ORAL
  Filled 2015-08-18 (×2): qty 2

## 2015-08-18 MED ORDER — DEXTROSE-NACL 5-0.45 % IV SOLN
INTRAVENOUS | Status: DC
  Administered 2015-08-18: 16:00:00 via INTRAVENOUS

## 2015-08-18 MED ORDER — SODIUM CHLORIDE 0.9 % IV BOLUS (SEPSIS)
500.0000 mL | Freq: Once | INTRAVENOUS | Status: AC
  Administered 2015-08-18: 500 mL via INTRAVENOUS

## 2015-08-18 MED ORDER — INSULIN ASPART 100 UNIT/ML ~~LOC~~ SOLN
0.0000 [IU] | SUBCUTANEOUS | Status: DC
  Administered 2015-08-19 (×3): 15 [IU] via SUBCUTANEOUS
  Administered 2015-08-19: 3 [IU] via SUBCUTANEOUS
  Administered 2015-08-19: 11 [IU] via SUBCUTANEOUS
  Administered 2015-08-20: 8 [IU] via SUBCUTANEOUS
  Administered 2015-08-20: 2 [IU] via SUBCUTANEOUS
  Administered 2015-08-20 (×2): 5 [IU] via SUBCUTANEOUS

## 2015-08-18 MED ORDER — INSULIN ASPART 100 UNIT/ML ~~LOC~~ SOLN: 0.0000 [IU] | SUBCUTANEOUS | Status: DC

## 2015-08-18 MED ORDER — SIMVASTATIN 40 MG PO TABS
40.0000 mg | ORAL_TABLET | Freq: Every day | ORAL | Status: DC
  Administered 2015-08-19: 40 mg via ORAL
  Filled 2015-08-18: qty 1

## 2015-08-18 MED ORDER — SODIUM CHLORIDE 0.9 % IV BOLUS (SEPSIS)
1000.0000 mL | Freq: Once | INTRAVENOUS | Status: AC
  Administered 2015-08-18: 1000 mL via INTRAVENOUS

## 2015-08-18 MED ORDER — FAMOTIDINE IN NACL 20-0.9 MG/50ML-% IV SOLN
20.0000 mg | Freq: Two times a day (BID) | INTRAVENOUS | Status: DC
  Administered 2015-08-18 – 2015-08-19 (×3): 20 mg via INTRAVENOUS
  Filled 2015-08-18 (×3): qty 50

## 2015-08-18 MED ORDER — ACETAMINOPHEN 650 MG RE SUPP: 650.0000 mg | Freq: Four times a day (QID) | RECTAL | Status: DC | PRN

## 2015-08-18 MED ORDER — INSULIN DETEMIR 100 UNIT/ML ~~LOC~~ SOLN
25.0000 [IU] | Freq: Every day | SUBCUTANEOUS | Status: DC
  Administered 2015-08-18 – 2015-08-20 (×3): 25 [IU] via SUBCUTANEOUS
  Filled 2015-08-18 (×3): qty 0.25

## 2015-08-18 MED ORDER — SODIUM CHLORIDE 0.9 % IV SOLN
INTRAVENOUS | Status: DC
  Administered 2015-08-18: 17:00:00 via INTRAVENOUS

## 2015-08-18 MED ORDER — INSULIN DETEMIR 100 UNIT/ML ~~LOC~~ SOLN: 25.0000 [IU] | Freq: Every day | SUBCUTANEOUS | Status: DC

## 2015-08-18 MED ORDER — SODIUM CHLORIDE 0.9 % IV SOLN: INTRAVENOUS | Status: DC

## 2015-08-18 MED ORDER — DIPHENHYDRAMINE HCL 50 MG/ML IJ SOLN
50.0000 mg | Freq: Once | INTRAMUSCULAR | Status: AC
  Administered 2015-08-18: 50 mg via INTRAVENOUS
  Filled 2015-08-18: qty 1

## 2015-08-18 MED ORDER — ACETAMINOPHEN 325 MG PO TABS: 650.0000 mg | ORAL_TABLET | Freq: Four times a day (QID) | ORAL | Status: DC | PRN

## 2015-08-18 MED ORDER — SODIUM CHLORIDE 0.9 % IV SOLN
INTRAVENOUS | Status: DC | PRN
  Administered 2015-08-18: 5.1 [IU]/h via INTRAVENOUS
  Filled 2015-08-18: qty 2.5

## 2015-08-18 MED ORDER — INSULIN DETEMIR 100 UNIT/ML ~~LOC~~ SOLN
20.0000 [IU] | Freq: Every day | SUBCUTANEOUS | Status: DC
  Filled 2015-08-18: qty 0.2

## 2015-08-18 MED ORDER — DEXTROSE 50 % IV SOLN: 25.0000 mL | INTRAVENOUS | Status: DC | PRN

## 2015-08-18 MED ORDER — DEXTROSE 5 % IV SOLN
1.0000 g | Freq: Once | INTRAVENOUS | Status: AC
  Administered 2015-08-18: 1 g via INTRAVENOUS
  Filled 2015-08-18: qty 10

## 2015-08-18 MED ORDER — SODIUM CHLORIDE 0.9 % IV SOLN
INTRAVENOUS | Status: DC
  Administered 2015-08-18: 11:00:00 via INTRAVENOUS

## 2015-08-18 NOTE — ED Notes (Signed)
Pt reports that he woke up yesterday and found himself "weak and itching", he was throwing up and nauseas all day. Last night he "itched myself to death".  This morning he woke up and found that his mouth and hands are swollen.

## 2015-08-18 NOTE — Progress Notes (Signed)
Patient arrived to 2C18 via stretcher. Patient alert & oriented x4. CHG bath completed. Denies any pain or discomfort at this time.

## 2015-08-18 NOTE — ED Notes (Signed)
Admitting MD returned page and made aware of critical lactic

## 2015-08-18 NOTE — H&P (Signed)
Date: 08/18/2015               Patient Name:  Austin Alexander MRN: [id number]  DOB: Apr 12, 1971 Age / Sex: 45 y.o., male   PCP: Rogers Blocker, MD         Medical Service: Internal Medicine Teaching Service         Attending Physician: Dr. Annia Belt, MD    First Contact: Dr. Blane Ohara Pager: [phone]  Second Contact: Dr. Jacques Earthly Pager: [phone]       After Hours (After 5p/  First Contact Pager: [phone]  weekends / holidays): Second Contact Pager: [phone]   Chief Complaint: nausea and vomiting  History of Present Illness: Mr. Lebow is a 45 year old male with PMH of DM type 2, chronic systolic HF, asthma here with complaint of N/V x 4, diarrhea x 3 yesterday, itching all over and felt like he might pass out.  He noticed whelps on his legs, arms and wrists.  He tried to take a shower and rub alcohol on his body but it did not help.  When he woke up this morning around 4AM his lips, eyelids, hands/arms were swollen.  No problems, breathing, swallowing or handling saliva.  He ate at pizza place Saturday and went to a friend's house that is being renovated. He suspects he may have been allergic to something at his friend's house. He does not remember any insect bites.  No new medication.  He reports compliance with insulin 40 units daily. He started [place] a few months ago.  He says he was recently seen Dr. Terrence Dupont a month ago and was told his heart function had improved and he is planning to change some his medications at his upcoming appointment.  He smokes 1PPD x 30 years.  EtOH on weekend, says he drank more than usual on Saturday night - 7 shots of liquor, "a few" beers.  He denies illicit drug use.   Dr. Marlou Sa (PCP) - appt 05/18 Dr. Terrence Dupont (Cardiologist) - appt this month  Meds: Current Facility-Administered Medications  Medication Dose Route Frequency Provider Last Rate Last Dose  . 0.9 %  sodium chloride infusion   Intravenous Continuous Okey Regal, PA-C      .  cefTRIAXone (ROCEPHIN) 1 g in dextrose 5 % 50 mL IVPB  1 g Intravenous Once [employer], PA-C 100 mL/hr at 08/18/15 0917 1 g at 08/18/15 0917  . dextrose 5 %-0.45 % sodium chloride infusion   Intravenous Continuous Okey Regal, PA-C      . famotidine (PEPCID) IVPB 20 mg premix  20 mg Intravenous Q12H Deno Etienne, DO   Stopped at 08/18/15 0745  . insulin aspart (novoLOG) injection 0-24 Units  0-24 Units Subcutaneous Q4H Jeffrey Hedges, PA-C      . insulin regular (NOVOLIN R,HUMULIN R) 250 Units in sodium chloride 0.9 % 250 mL (1 Units/mL) infusion   Intravenous Continuous PRN Okey Regal, PA-C 4.1 mL/hr at 08/18/15 0845 4.1 Units/hr at 08/18/15 0845   Current Outpatient Prescriptions  Medication Sig Dispense Refill  . aspirin EC 81 MG EC tablet Take 1 tablet (81 mg total) by mouth daily. 30 tablet 6  . carvedilol (COREG) 25 MG tablet Take 25 mg by mouth 2 (two) times daily with a meal.    . digoxin (LANOXIN) 0.25 MG tablet Take 0.25 mg by mouth daily.    . furosemide (LASIX) 40 MG tablet Take 1 tablet (40 mg total) by mouth daily. (Patient taking  differently: Take 80 mg by mouth daily. ) 30 tablet 0  . glimepiride (AMARYL) 4 MG tablet Take 4 mg by mouth daily.  0  . insulin detemir (LEVEMIR) 100 UNIT/ML injection Inject 0.15 mLs (15 Units total) into the skin daily. (Patient taking differently: Inject 40 Units into the skin daily. ) 10 mL 0  . ivabradine (CORLANOR) 5 MG TABS tablet Take 5 mg by mouth daily.    . Multiple Vitamin (MULTIVITAMIN WITH MINERALS) TABS tablet Take 1 tablet by mouth daily.    . simvastatin (ZOCOR) 40 MG tablet Take 1 tablet (40 mg total) by mouth daily. 30 tablet 0  . blood glucose meter kit and supplies Dispense based on patient and insurance preference. Use up to four times daily as directed. (FOR ICD-9 250.00, 250.01). 1 each 0  . HYDROcodone-acetaminophen (NORCO) 5-325 MG tablet 1-2 tabs po q6 hours prn pain (Patient not taking: Reported on 08/18/2015) 30 tablet 0    . sulfamethoxazole-trimethoprim (BACTRIM DS) 800-160 MG tablet Take 1 tablet by mouth 2 (two) times daily. (Patient not taking: Reported on 08/18/2015) 14 tablet 0    Allergies: Allergies as of 08/18/2015  . (No Known Allergies)   Past Medical History  Diagnosis Date  . Bronchitis   . Croup   . Tobacco abuse   . Dizziness   . Abscess of perineum   . Heartburn   . Indigestion   . H/O folliculitis   . 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 Alcohol abuse   . Hypertension     variable in nature  . Hyperlipidemia   . Multiple excoriations     multifactorial in origin  . Mild obesity   . Dietary noncompliance     History of,  . Situational stress     secondary to financial issues  . Erectile dysfunction     Multifactorial. Improved with Cialis. New onset.  . Cystic acne     with acne keloidosis  . Diabetes mellitus without complication (HCC)     Type 2, uncontrolled with questionable improvement  . 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 EKG, abnormal     History of, with negative cardiac evaluation by history.  [name] COPD (chronic obstructive pulmonary disease) (HCC)     moderate obstruction with low vital capacity  . Asthma     History of,  . CHF (congestive heart failure) [facility])    Past Surgical History  Procedure Laterality Date  . Incision and drainage perirectal abscess N/A 02/17/2014    Procedure: IRRIGATION AND DEBRIDEMENT PERIRECTAL  AND SCROTAL ABSCESS;  Surgeon: Coralie Keens, MD;  Location: [place];  Service: General;  Laterality: N/A;  . I&d extremity Left 06/28/2014    Procedure: NRWCHJSCBI AND DEBRIDEMENT EXTREMITY;  Surgeon: Leanora Cover, MD;  Location: [place];  Service: Orthopedics;  Laterality: Left;  [name] Minor irrigation and debridement of wound Left 06/20/2015    Procedure: IRRIGATION AND DEBRIDEMENT Wound with nailbed repair;  Surgeon: Leanora Cover, MD;  Location: [place];  Service: Orthopedics;  Laterality:  Left;   Family History  Problem Relation Age of Onset  . Diabetes type II Mother   . Hypertension Mother    Social History   Social History  . Marital Status: Single    Spouse Name: N/A  . Number of Children: N/A  . Years of Education: N/A   Occupational History  . Not on file.  Social History Main Topics  . Smoking status: Current Every Day Smoker -- 1.00 packs/day for 20 years    Types: Cigarettes  . Smokeless tobacco: Not on file  . Alcohol Use: 7.2 oz/week    10 Cans of beer, 2 Shots of liquor per week     Comment: As of 04/06/14 - states occasional use  . Drug Use: No  . Sexual Activity: Yes   Other Topics Concern  . Not on file   Social History Narrative    Review of Systems: General:  Denies fever/chills, increased fatigue, change in appetite or unexplained weight loss HEENT:  Denies headache, change in vision or hearing, difficulty swallowing Heart:  Denies chest pain, palpitations, PND, orthopnea, leg edema Lungs:  Denies dyspnea, cough, increased sputum GI:  Per HPI GU:  Denies difficulty urinating, dysuria or hematuria Neuro:  Denies unilateral weakness, numbness/tingling Skin:  Per HPI  Physical Exam: Blood pressure 96/77, pulse 75, temperature 97.5 F (36.4 C), temperature source Oral, resp. rate 20, height 6' (1.829 m), weight 245 lb (111.131 kg), SpO2 96 %. General: resting in bed in NAD HEENT: PERRL, EOMI, no scleral icterus, OP clear and MMM Cardiac: RRR, no rubs, murmurs or gallops Pulm: no respiratory distress, speaking in full sentences, clear to auscultation bilaterally, moving normal volumes of air Abd: soft, nontender, nondistended, BS present Ext: warm and well perfused, no pedal edema, hands are swollen Neuro: alert and oriented X3, cranial nerves II-XII grossly intact Skin:  Few resolving hives on left inner thigh  Lab results: Basic Metabolic Panel:  Recent Labs  08/18/15 0630  NA 115*  K 6.2*  CL 81*  CO2 20*  GLUCOSE 577*   BUN 52*  CREATININE 4.96*  CALCIUM 8.5*  MG 2.1   Liver Function Tests:  Recent Labs  08/18/15 0630  AST 19  ALT 15*  ALKPHOS 69  BILITOT 1.2  PROT 6.4*  ALBUMIN 2.8*    Recent Labs  08/18/15 0630  LIPASE 18   CBC:  Recent Labs  08/18/15 0630  WBC 9.2  NEUTROABS 7.0  HGB 14.2  HCT 40.2  MCV 83.6  PLT 370   Cardiac Enzymes:  Recent Labs  08/18/15 1149  TROPONINI <0.03   CBG:  Recent Labs  08/18/15 0611 08/18/15 0722 08/18/15 0847  GLUCAP 589* 567* 465*   Urine Drug Screen: Drugs of Abuse     Component Value Date/Time   LABOPIA NONE DETECTED 08/18/2015 1110   COCAINSCRNUR NONE DETECTED 08/18/2015 1110   LABBENZ NONE DETECTED 08/18/2015 1110   AMPHETMU NONE DETECTED 08/18/2015 1110   THCU NONE DETECTED 08/18/2015 1110   LABBARB NONE DETECTED 08/18/2015 1110    Alcohol Level:  Recent Labs  08/18/15 1149  ETH <5   Urinalysis:  Recent Labs  08/18/15 1110  COLORURINE YELLOW  LABSPEC 1.023  PHURINE 5.0  GLUCOSEU >1000*  HGBUR SMALL*  BILIRUBINUR NEGATIVE  KETONESUR NEGATIVE  PROTEINUR 30*  NITRITE NEGATIVE  LEUKOCYTESUR NEGATIVE   Imaging results:  Dg Chest Portable 1 View  08/18/2015  CLINICAL DATA:  Diabetic ketoacidosis EXAM: PORTABLE CHEST 1 VIEW COMPARISON:  April 06, 2014 FINDINGS: There is no edema or consolidation. Heart is mildly enlarged with pulmonary vascularity within normal limits. No adenopathy. No bone lesions. IMPRESSION: Stable cardiac enlargement.  No edema or consolidation. Electronically Signed   By: Lowella Grip III M.D.   On: 08/18/2015 08:32    Other results: EKG: NSR, 75 bpm, no specific ST-T abnormalities  Assessment &  Plan by Problem: 45 year old male with PMH of DM type 2, chronic systolic HF, asthma here with c/o N/V, diffuse pruritis, urticaria and swelling of eyelids, lips, arms/hands.    Allergic reaction w/ angioedema:  Patient with c/o diffuse pruritis and urticaria since yesterday and  woke up with swelling this AM and hypotension.  Etiology possibly related to exposure at renovated house but not entirely clear. He has responded significantly to epinephrine, benadryl, famotidine, steroid.  The urticaria has nearly resolved.  His hands still appear swollen.  He denies any involvement of his tongue or throat and never had difficulty breathing or managing secretions.  He takes entresto (contains an ARB) but I would not expect pruritis with ACEI induced angioedema. - continue H1 and H2 blocker - add epi +/- steroid if symptoms worsen (careful w/steroids given elevated CBGs) - no sign of airway involvement, will monitor him closely in SDU; patient has been instructed to notify staff immediately of difficulty breathing/swallowing/managing secretions - IV fluids - hold entresto and ivabradine for now (both can precipitate angioedema) - can consider re-challenge if this turns out to be all mast cell mediated (as opposed to bradykinin mediated) - check HIV  DM type 2 with hyperglycemia:  Patient initially treated as DKA in ED.  Blood sugar is elevated, perhaps due to stress of allergic reaction.  No AG, pH normal, no ketonuria, mental status normal.  - transition off insulin gtt to [place] insulin  - continue IV fluids, gentle given low EF  Hyponatremia:   Exacerbated by hyperglycemia, however corrected Na is still low at 123-126.  Volume depletion from N/V and diarrhea likely contributing.  He admits to drinking more beers than usual on Saturday night (6-7) and also drinking a lot of water yesterday, however clinically seems volume down.  He has HF but is not volume overloaded. - continue gentle IV fluids given HF (hydrating given dehydration) - BMP q4h  AKI:  Baseline Cr ~ 1.2.  Cr 4.96.  Likely pre-renal given hx. - hydration as above - trend BMP, I/O  Chronic systolic CHF:  EF 67% in January 2015. However, he follows with Dr. Terrence Dupont and patient reports recent ECHO that revealed  improvement in his EF.  Reports compliance with ivabradine, entresto, BB. - continue BB - hold ivabradine and entresto as above - daily weights and strict I/O - follow-up with Dr. Terrence Dupont this month as scheduled  HTN:  Initially hypotensive but responding to fluids and now normotensive. - continue BB - hold ARB as above - monitor vitals pre protocol  Tobacco abuse:  1PPD - advise cessation  Diet:  Carb mod VTE ppx:  [place] heparin, SCDs Code status:  Full   Dispo: Disposition is deferred at this time, awaiting improvement of current medical problems. Anticipated discharge in approximately 2-3 day(s).   The patient does have a current PCP Rogers Blocker, MD) and does not need an [facility] hospital follow-up appointment after discharge.  The patient does not have transportation limitations that hinder transportation to clinic appointments.  Signed: Francesca Oman, DO 08/18/2015, 9:33 AM

## 2015-08-18 NOTE — ED Notes (Signed)
Insulin gtt order confirmed with Hedges PA.

## 2015-08-18 NOTE — ED Notes (Signed)
Pt's CBG result was 364. Informed Millie - RN.

## 2015-08-18 NOTE — ED Notes (Signed)
MD at bedside. 

## 2015-08-18 NOTE — ED Provider Notes (Signed)
CSN: [id number]     Arrival date & time 08/18/15  0549 History   First MD Initiated Contact with Patient 08/18/15 [phone]     Chief Complaint  Patient presents with  . Insect Bite  . Weakness    HPI   45 year old male presents today with weakness. Patient reports that over the weekend he was feeling well, was drinking alcohol Saturday night and woke up Sunday morning feeling sick. He reports feeling extremely weak, with itching and hives. He notes 4 episodes of vomiting, several episodes of diarrhea, and generalized abdominal discomfort associated with coughing, movement, or vomiting ( denies abdominal pain at rest).  Patient reports the is diabetic, has not checked his blood sugar in several days, but notes that he's been taking his 40 units of insulin every morning. Patient notes hives to his upper extremities chest and lower extremities starting yesterday, he notes that upon awakening today he had swelling to his lips and eye. Patient denies any chest pain or shortness of breath. Patient denies any changes in medications, allergic exposure, history of allergies or history of the same. Patient notes that he is a heart failure patient, currently taking his meds as directed, followed by Dr. Terrence Dupont. He reports that last month he had an echocardiogram that showed normal heart function, and that Dr. Casandra Doffing  was going to adjust his heart medications.   Past Medical History  Diagnosis Date  . Bronchitis   . Croup   . Tobacco abuse   . Dizziness   . Abscess of perineum   . Heartburn   . Indigestion   . H/O folliculitis   . 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 Alcohol abuse   . Hypertension     variable in nature  . Hyperlipidemia   . Multiple excoriations     multifactorial in origin  . Mild obesity   . Dietary noncompliance     History of,  . Situational stress     secondary to financial issues  . Erectile dysfunction     Multifactorial. Improved with Cialis. New onset.   . Cystic acne     with acne keloidosis  . Diabetes mellitus without complication (HCC)     Type 2, uncontrolled with questionable improvement  . 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 EKG, abnormal     History of, with negative cardiac evaluation by history.  [name] COPD (chronic obstructive pulmonary disease) (HCC)     moderate obstruction with low vital capacity  . Asthma     History of,  . CHF (congestive heart failure) [facility])    Past Surgical History  Procedure Laterality Date  . Incision and drainage perirectal abscess N/A 02/17/2014    Procedure: IRRIGATION AND DEBRIDEMENT PERIRECTAL  AND SCROTAL ABSCESS;  Surgeon: Coralie Keens, MD;  Location: [place];  Service: General;  Laterality: N/A;  . I&d extremity Left 06/28/2014    Procedure: ZSWFUXNATF AND DEBRIDEMENT EXTREMITY;  Surgeon: Leanora Cover, MD;  Location: [place];  Service: Orthopedics;  Laterality: Left;  [name] Minor irrigation and debridement of wound Left 06/20/2015    Procedure: IRRIGATION AND DEBRIDEMENT Wound with nailbed repair;  Surgeon: Leanora Cover, MD;  Location: [place];  Service: Orthopedics;  Laterality: Left;   Family History  Problem Relation Age of Onset  . Diabetes type II Mother   . Hypertension Mother    Social History  Substance Use Topics  . Smoking  status: Current Every Day Smoker -- 1.00 packs/day for 20 years    Types: Cigarettes  . Smokeless tobacco: None  . Alcohol Use: 7.2 oz/week    10 Cans of beer, 2 Shots of liquor per week     Comment: As of 04/06/14 - states occasional use    Review of Systems  All other systems reviewed and are negative.     Allergies  Review of patient's allergies indicates no known allergies.  Home Medications   Prior to Admission medications   Medication Sig Start Date End Date Taking? Authorizing Provider  aspirin EC 81 MG EC tablet Take 1 tablet (81 mg total) by mouth daily. 09/23/13  Yes Ejiroghene Arlyce Dice, MD   carvedilol (COREG) 25 MG tablet Take 25 mg by mouth 2 (two) times daily with a meal.   Yes Historical Provider, MD  digoxin (LANOXIN) 0.25 MG tablet Take 0.25 mg by mouth daily.   Yes Historical Provider, MD  furosemide (LASIX) 40 MG tablet Take 1 tablet (40 mg total) by mouth daily. Patient taking differently: Take 80 mg by mouth daily.  04/08/14  Yes Thurnell Lose, MD  glimepiride (AMARYL) 4 MG tablet Take 4 mg by mouth daily. 03/18/14  Yes Historical Provider, MD  insulin detemir (LEVEMIR) 100 UNIT/ML injection Inject 0.15 mLs (15 Units total) into the skin daily. Patient taking differently: Inject 40 Units into the skin daily.  08/13/14  Yes Velvet Bathe, MD  ivabradine (CORLANOR) 5 MG TABS tablet Take 5 mg by mouth daily.   Yes Historical Provider, MD  Multiple Vitamin (MULTIVITAMIN WITH MINERALS) TABS tablet Take 1 tablet by mouth daily.   Yes Historical Provider, MD  simvastatin (ZOCOR) 40 MG tablet Take 1 tablet (40 mg total) by mouth daily. 09/23/13  Yes Ejiroghene Arlyce Dice, MD  blood glucose meter kit and supplies Dispense based on patient and insurance preference. Use up to four times daily as directed. (FOR ICD-9 250.00, 250.01). 08/12/14   Velvet Bathe, MD  HYDROcodone-acetaminophen [facility]) 5-325 MG tablet 1-2 tabs po q6 hours prn pain Patient not taking: Reported on 08/18/2015 06/20/15   Leanora Cover, MD  sulfamethoxazole-trimethoprim (BACTRIM DS) 800-160 MG tablet Take 1 tablet by mouth 2 (two) times daily. Patient not taking: Reported on 08/18/2015 06/20/15   Leanora Cover, MD   BP 117/81 mmHg  Pulse 78  Temp(Src) 97.5 F (36.4 C) (Oral)  Resp 16  Ht 6' (1.829 m)  Wt 111.131 kg  BMI 33.22 kg/m2  SpO2 98%   Physical Exam  Constitutional: He is oriented to person, place, and time. He appears well-developed and well-nourished.  HENT:  Head: Normocephalic and atraumatic.  Swelling to lips and eyelids, no swelling of tongue mouth or throat.   Eyes: Conjunctivae are normal. Pupils are  equal, round, and reactive to light. Right eye exhibits no discharge. Left eye exhibits no discharge. No scleral icterus.  Neck: Normal range of motion. No JVD present. No tracheal deviation present.  Cardiovascular: Normal heart sounds and intact distal pulses.   Pulmonary/Chest: Effort normal and breath sounds normal. No stridor. No respiratory distress. He has no wheezes. He has no rales. He exhibits no tenderness.  Abdominal: Soft. He exhibits no distension and no mass. There is no tenderness. There is no rebound and no guarding.  Neurological: He is alert and oriented to person, place, and time. Coordination normal.  Skin: Skin is warm and dry. Rash noted.  Hives noted to skin diffusely   Psychiatric: He has  a normal mood and affect. His behavior is normal. Judgment and thought content normal.  Nursing note and vitals reviewed.   ED Course  Procedures (including critical care time)  CRITICAL CARE Performed by: Elmer Ramp   Total critical care time: 35 minutes  Critical care time was exclusive of separately billable procedures and treating other patients.  Critical care was necessary to treat or prevent imminent or life-threatening deterioration.  Critical care was time spent personally by me on the following activities: development of treatment plan with patient and/or surrogate as well as nursing, discussions with consultants, evaluation of patient's response to treatment, examination of patient, obtaining history from patient or surrogate, ordering and performing treatments and interventions, ordering and review of laboratory studies, ordering and review of radiographic studies, pulse oximetry and re-evaluation of patient's condition.   Labs Review Labs Reviewed  COMPREHENSIVE METABOLIC PANEL - Abnormal; Notable for the following:    Sodium 115 (*)    Potassium 6.2 (*)    Chloride 81 (*)    CO2 20 (*)    Glucose, Bld 577 (*)    BUN 52 (*)    Creatinine, Ser 4.96  (*)    Calcium 8.5 (*)    Total Protein 6.4 (*)    Albumin 2.8 (*)    ALT 15 (*)    GFR calc non Af Amer 13 (*)    GFR calc Af Amer 15 (*)    All other components within normal limits  CBG MONITORING, ED - Abnormal; Notable for the following:    Glucose-Capillary 589 (*)    All other components within normal limits  CBG MONITORING, ED - Abnormal; Notable for the following:    Glucose-Capillary 567 (*)    All other components within normal limits  I-STAT CG4 LACTIC ACID, ED - Abnormal; Notable for the following:    Lactic Acid, Venous 2.88 (*)    All other components within normal limits  I-STAT VENOUS BLOOD GAS, ED - Abnormal; Notable for the following:    pH, Ven 7.359 (*)    pCO2, Ven 38.6 (*)    pO2, Ven 28.0 (*)    Acid-base deficit 3.0 (*)    All other components within normal limits  CBG MONITORING, ED - Abnormal; Notable for the following:    Glucose-Capillary 465 (*)    All other components within normal limits  CULTURE, BLOOD (ROUTINE X 2)  CULTURE, BLOOD (ROUTINE X 2)  CBC WITH DIFFERENTIAL/PLATELET  LIPASE, BLOOD  MAGNESIUM  URINALYSIS, ROUTINE W REFLEX MICROSCOPIC (NOT AT [facility])  URINE RAPID DRUG SCREEN, HOSP PERFORMED  BLOOD GAS, VENOUS    Imaging Review Dg Chest Portable 1 View  08/18/2015  CLINICAL DATA:  Diabetic ketoacidosis EXAM: PORTABLE CHEST 1 VIEW COMPARISON:  April 06, 2014 FINDINGS: There is no edema or consolidation. Heart is mildly enlarged with pulmonary vascularity within normal limits. No adenopathy. No bone lesions. IMPRESSION: Stable cardiac enlargement.  No edema or consolidation. Electronically Signed   By: Lowella Grip III M.D.   On: 08/18/2015 08:32   I have personally reviewed and evaluated these images and lab results as part of my medical decision-making.   EKG Interpretation None      MDM   Final diagnoses:  Diabetic ketoacidosis without coma associated with type 2 diabetes mellitus (HCC)  Hyponatremia  Hyperkalemia   AKI (acute kidney injury) (Agenda)    Labs: CBC, CMP, lipase, CBG  Imaging:DG chest 2 view  Consults: Teaching service  Therapeutics: Normal saline,  insulin, epinephrine, Benadryl, Solu-Medrol, Pepcid  Discharge Meds:   Assessment/Plan: 45 year old male presents today with DKA with hypotension likely volume related, AKI, hyponatremia, hyperkalemia. Pt is a complicated patient with DKA with elevated lactic and signs of allergic reaction. Pt was given immediately started on allergic protocol with famotidine, diphenhydramine, and a dose of epinephrine due to the hypotension. Pt reported improvement in itchy but remained hypotensive. He was immediately started on NS with labs pending.  Pt remained afebrile throughout ED course with improvement in BP with fluids. Pt was started on sepsis protocol.   Labs are significant for Lactic acid  2.88, BVG 7.359, cbg 567, sodium 115, potassium 6.2, and creatine 4.96.   Patient remained stable here in the ED, and will be admitted to teaching service.        Okey Regal, PA-C 08/18/15 [place], DO 08/21/15 1410

## 2015-08-18 NOTE — ED Provider Notes (Signed)
Medical screening examination/treatment/procedure(s) were conducted as a shared visit with non-physician practitioner(s) and myself.  I personally evaluated the patient during the encounter.   EKG Interpretation   Date/Time:  Monday Aug 18 2015 09:34:16 EDT Ventricular Rate:  75 PR Interval:  146 QRS Duration: 102 QT Interval:  358 QTC Calculation: 400 R Axis:   49 Text Interpretation:  Sinus rhythm Nonspecific T abnormalities, lateral  leads No significant change was found Confirmed by CAMPOS  MD, Lennette Bihari  ([id number]) on 08/19/2015 9:53:08 AM       See the written copy of this report in the patient's paper medical record.  These results did not interface directly into the electronic medical record and are summarized here.  45 yo M with itching rash, weakness and vomiting. Going on since yesterday. Diffuse hives on exam, clear lungs, no abdominal pain.  BP in the 70's.  Given epi, bolus, pepcid. Solumedrol. Hyperglycemic as well, ? DKA.    CRITICAL CARE Performed by: Cecilio Asper   Total critical care time: 35 minutes  Critical care time was exclusive of separately billable procedures and treating other patients.  Critical care was necessary to treat or prevent imminent or life-threatening deterioration.  Critical care was time spent personally by me on the following activities: development of treatment plan with patient and/or surrogate as well as nursing, discussions with consultants, evaluation of patient's response to treatment, examination of patient, obtaining history from patient or surrogate, ordering and performing treatments and interventions, ordering and review of laboratory studies, ordering and review of radiographic studies, pulse oximetry and re-evaluation of patient's condition.   Deno Etienne, DO 08/21/15 1411

## 2015-08-18 NOTE — ED Provider Notes (Signed)
Signed out to follow up patient with hypotension and concern for DKA. Patient denies fevers and no clinical evidence of infection on exam. Patient had mixed presentation with a rash that resolved but denies any new medications or new foods. Patient is not on ACE inhibitor. Patient blood pressure improved with the fluid boluses. Insulin drip titrated in the ER. With lactate elevated and concern for DKA sepsis screen ordered. Patient improved on reassessment plan for admission.  CRITICAL CARE Performed by: Mariea Clonts   Total critical care time: 35 minutes  Critical care time was exclusive of separately billable procedures and treating other patients.  Critical care was necessary to treat or prevent imminent or life-threatening deterioration.  Critical care was time spent personally by me on the following activities: development of treatment plan with patient and/or surrogate as well as nursing, discussions with consultants, evaluation of patient's response to treatment, examination of patient, obtaining history from patient or surrogate, ordering and performing treatments and interventions, ordering and review of laboratory studies, ordering and review of radiographic studies, pulse oximetry and re-evaluation of patient's condition.    Elnora Morrison, MD 08/20/15 [phone]

## 2015-08-18 NOTE — Progress Notes (Addendum)
Inpatient Diabetes Program Recommendations  AACE/ADA: New Consensus Statement on Inpatient Glycemic Control (2015)  Target Ranges:  Prepandial:   less than 140 mg/dL      Peak postprandial:   less than 180 mg/dL (1-2 hours)      Critically ill patients:  140 - 180 mg/dL   Review of Glycemic Control  Diabetes history: DM 2 Outpatient Diabetes medications: Levemir 40, Amaryl 4 Daily Current orders for Inpatient glycemic control: IV insulin  Inpatient Diabetes Program Recommendations:   Patient was just d/c'd on 5/2 with A1c of 15.5% and was placed on Levemir 15 Daily (now increased to 40) and continued on Amaryl 4 mg Daily. Metformin was d/c'd due to renal function. Patient was educated  And shown how to inject insulin at that time and was successful. Patient was also given a prescription for a glucose meter kit to check his blood glucose at home. Unsure why glucose was 577 on admission and why patient was not checking his glucose at home.   IV Insulin appropriate for now. When patient is ready to transition, please consider starting Levemir 18-20 units Q 24hrs and Novolog Moderate TID + HS scale.   Thanks,  Tama Headings RN, MSN, [facility] Inpatient Diabetes Coordinator Team Pager [phone] (8a-5p)

## 2015-08-19 ENCOUNTER — Observation Stay (HOSPITAL_COMMUNITY): Payer: 59

## 2015-08-19 ENCOUNTER — Other Ambulatory Visit (HOSPITAL_COMMUNITY)
Admission: RE | Admit: 2015-08-19 | Discharge: 2015-08-19 | Disposition: A | Discharge status: Home / Self Care | Payer: 59 | Source: Ambulatory Visit | Attending: Oncology | Admitting: Oncology

## 2015-08-19 DIAGNOSIS — R319 Hematuria, unspecified: Secondary | ICD-10-CM | POA: Insufficient documentation

## 2015-08-19 LAB — BRAIN NATRIURETIC PEPTIDE: B Natriuretic Peptide: 31.5 pg/mL (ref 0.0–100.0)

## 2015-08-19 LAB — BASIC METABOLIC PANEL
Anion gap: 15 (ref 5–15)
Anion gap: 14 (ref 5–15)
BUN: 65 mg/dL — ABNORMAL HIGH (ref 6–20)
BUN: 62 mg/dL — ABNORMAL HIGH (ref 6–20)
CHLORIDE: 96 mmol/L — AB (ref 101–111)
CO2: 18 mmol/L — ABNORMAL LOW (ref 22–32)
CO2: 20 mmol/L — ABNORMAL LOW (ref 22–32)
Calcium: 8 mg/dL — ABNORMAL LOW (ref 8.9–10.3)
Calcium: 8.4 mg/dL — ABNORMAL LOW (ref 8.9–10.3)
Chloride: 92 mmol/L — ABNORMAL LOW (ref 101–111)
Creatinine, Ser: 3.97 mg/dL — ABNORMAL HIGH (ref 0.61–1.24)
Creatinine, Ser: 3.35 mg/dL — ABNORMAL HIGH (ref 0.61–1.24)
GFR calc Af Amer: 19 mL/min — ABNORMAL LOW (ref >60)
GFR calc Af Amer: 24 mL/min — ABNORMAL LOW (ref >60)
GFR calc non Af Amer: 17 mL/min — ABNORMAL LOW (ref >60)
GFR, EST NON AFRICAN AMERICAN: 20 mL/min — AB (ref >60)
Glucose, Bld: 249 mg/dL — ABNORMAL HIGH (ref 65–99)
Glucose, Bld: 167 mg/dL — ABNORMAL HIGH (ref 65–99)
POTASSIUM: 4.9 mmol/L (ref 3.5–5.1)
POTASSIUM: 4.8 mmol/L (ref 3.5–5.1)
SODIUM: 125 mmol/L — AB (ref 135–145)
SODIUM: 130 mmol/L — AB (ref 135–145)

## 2015-08-19 LAB — GLUCOSE, CAPILLARY
GLUCOSE-CAPILLARY: 338 mg/dL — AB (ref 65–99)
GLUCOSE-CAPILLARY: 364 mg/dL — AB (ref 65–99)
GLUCOSE-CAPILLARY: 380 mg/dL — AB (ref 65–99)
GLUCOSE-CAPILLARY: 392 mg/dL — AB (ref 65–99)
Glucose-Capillary: 160 mg/dL — ABNORMAL HIGH (ref 65–99)
Glucose-Capillary: 113 mg/dL — ABNORMAL HIGH (ref 65–99)

## 2015-08-19 LAB — PROTEIN / CREATININE RATIO, URINE
Creatinine, Urine: 51.59 mg/dL
PROTEIN CREATININE RATIO: 0.33 mg/mg{creat} — AB (ref 0.00–0.15)
Total Protein, Urine: 17 mg/dL

## 2015-08-19 LAB — LACTIC ACID, PLASMA: LACTIC ACID, VENOUS: 1.3 mmol/L (ref 0.5–2.0)

## 2015-08-19 LAB — HEMOGLOBIN A1C
Hgb A1c MFr Bld: 15.2 % — ABNORMAL HIGH (ref 4.8–5.6)
Mean Plasma Glucose: 390 mg/dL

## 2015-08-19 LAB — SODIUM, URINE, RANDOM: SODIUM UR: 33 mmol/L

## 2015-08-19 LAB — HIV ANTIBODY (ROUTINE TESTING W REFLEX): HIV Screen 4th Generation wRfx: NONREACTIVE

## 2015-08-19 MED ORDER — FAMOTIDINE 20 MG PO TABS
20.0000 mg | ORAL_TABLET | Freq: Every day | ORAL | Status: DC
  Administered 2015-08-20: 20 mg via ORAL
  Filled 2015-08-19: qty 1

## 2015-08-19 MED ORDER — LIVING WELL WITH DIABETES BOOK
Freq: Once | Status: AC
  Administered 2015-08-19: 17:00:00
  Filled 2015-08-19: qty 1

## 2015-08-19 NOTE — Progress Notes (Signed)
Inpatient Diabetes Program Recommendations  AACE/ADA: New Consensus Statement on Inpatient Glycemic Control (2015)  Target Ranges:  Prepandial:   less than 140 mg/dL      Peak postprandial:   less than 180 mg/dL (1-2 hours)      Critically ill patients:  140 - 180 mg/dL   Review of Glycemic Control:  Results for Austin Alexander, Austin Alexander (MRN [id number]) as of 08/19/2015 15:18  Ref. Range 08/19/2015 04:26 08/19/2015 07:34 08/19/2015 12:16  Glucose-Capillary Latest Ref Range: 65-99 mg/dL 160 (H) 113 (H) 380 (H)   Results for Austin Alexander, Austin Alexander (MRN [id number]) as of 08/19/2015 15:18  Ref. Range 08/18/2015 11:49  Hemoglobin A1C Latest Ref Range: 4.8-5.6 % 15.2 (H)   Diabetes history: Type 2 diabetes Outpatient Diabetes medications: Levemir 40 units daily, Amaryl 4 units daily Current orders for Inpatient glycemic control:  Novolog moderate q 4 hours, Levemir 25 units daily  Inpatient Diabetes Program Recommendations:    Note that patient has very high A1C.  He states that he has been taking insulin daily in the morning.  He has not been monitoring and notes that his meter is not working.  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  He states that he thinks he needs short acting insulin to cover his meal intake at home.    Discussed differences between short acting and long acting insulins.  He is agreeable to taking short/rapid acting insulin too.  Discussed A1C results and need to control blood sugars better.  Placed order per protocol for outpatient diabetes education and encouraged patient to follow-up with Dr. Marlou Sa.  Will also order inpatient diabetes education materials.    Please consider increasing Levemir to home dose of 40 units daily and add Novolog 8 units meal coverage tid with meals.    Thanks, Adah Perl, RN, BC-ADM Inpatient Diabetes Coordinator Pager [phone]

## 2015-08-19 NOTE — Progress Notes (Signed)
Subjective: Mr. Rzepka had no acute events overnight. This morning, he feels much better. He says the itching and rash are almost gone, with some residual welts. He facial swelling is improved. He denies any respiratory symptoms or other new symptoms at this time and has no complaints.  Objective: Vital signs in last 24 hours: Filed Vitals:   08/19/15 0400 08/19/15 0500 08/19/15 0600 08/19/15 0700  BP: 121/56 140/75 130/82 102/67  Pulse: 91 102 93 81  Temp: 98.1 F (36.7 C)   97.8 F (36.6 C)  TempSrc: Oral   Oral  Resp: 15 19 9 15   Height:      Weight:  261 lb (118.389 kg)    SpO2: 93% 98% 93% 93%   Weight change: 16 lb (7.258 kg)  Intake/Output Summary (Last 24 hours) at 08/19/15 1142 Last data filed at 08/19/15 0900  Gross per 24 hour  Intake 2033.33 ml  Output   1300 ml  Net 733.33 ml     Gen: Well-appearing, alert and oriented to person, place, and time HEENT: Oropharynx clear without erythema or exudate.  Neck: No cervical LAD, no thyromegaly or nodules, no JVD noted. CV: Normal rate, regular rhythm, no murmurs, rubs, or gallops Pulmonary: Normal effort, CTA bilaterally, no crackles or wheezes Abdominal: Soft, non-tender, non-distended, without rebound, guarding, or masses Extremities: Distal pulses 2+ in upper and lower extremities bilaterally, no tenderness, erythema or edema Neuro: CN II-XII grossly intact, no focal weakness or sensory deficits noted Skin: No atypical appearing moles. No rashes  Lab Results: Basic Metabolic Panel:  Recent Labs Lab 08/18/15 0630  08/19/15 0211 08/19/15 0854  NA 115*  < > 125* 130*  K 6.2*  < > 4.9 4.8  CL 81*  < > 92* 96*  CO2 20*  < > 18* 20*  GLUCOSE 577*  < > 249* 167*  BUN 52*  < > 65* 62*  CREATININE 4.96*  < > 3.97* 3.35*  CALCIUM 8.5*  < > 8.0* 8.4*  MG 2.1  --   --   --   < > = values in this interval not displayed.  Hemoglobin A1C:  Recent Labs Lab 08/18/15 1149  HGBA1C 15.2*   Fasting Lipid  Panel:  Recent Labs Lab 08/18/15 1532  TRIG 392*   Urinalysis:  Recent Labs Lab 08/18/15 1110  COLORURINE YELLOW  LABSPEC 1.023  PHURINE 5.0  GLUCOSEU >1000*  HGBUR SMALL*  BILIRUBINUR NEGATIVE  KETONESUR NEGATIVE  PROTEINUR 30*  NITRITE NEGATIVE  LEUKOCYTESUR NEGATIVE   Studies/Results: Dg Chest Portable 1 View  08/18/2015  CLINICAL DATA:  Diabetic ketoacidosis EXAM: PORTABLE CHEST 1 VIEW COMPARISON:  April 06, 2014 FINDINGS: There is no edema or consolidation. Heart is mildly enlarged with pulmonary vascularity within normal limits. No adenopathy. No bone lesions. IMPRESSION: Stable cardiac enlargement.  No edema or consolidation. Electronically Signed   By: Lowella Grip III M.D.   On: 08/18/2015 08:32   Assessment/Plan: 1. Allergic reaction w/ angioedema: Patient presented c/o diffuse pruritis and urticaria since yesterday and woke up with swelling this AM and hypotension.He has responded significantly to epinephrine, benadryl, famotidine, steroid. The urticaria has nearly resolved.He has no symptoms of airway involvement. He takes entresto (contains an ARB), started ~3 months ago so this may be a delayed angioedema, but he also has acute exposure to dust/fumes the day before symptoms started which may be responsible for this. - Continue H1 and H2 blocker - Add epi +/- steroid if symptoms worsen (careful  w/steroids given elevated CBGs) - Monitor for airway involvement - IV fluids - Hold entresto and ivabradine for now (both can precipitate angioedema) - will discuss with his cardiologist regarding re-challenging as an outpatient  if this turns out to be all mast cell mediated (as opposed to bradykinin mediated) - Check HIV  2. AKI: Baseline Cr ~ 1.2. Cr 4.9 on admission, with gradual improvement in his Cr, now 3.3. This may be more than simple dehydration, possibly an intrarenal process related to presumed angioedema/hypersensitivity vs worsening baseline CKD given  his terribly-controlled diabetes (A1c here 15). - hydration as above - trend BMP, I/O - To better characterize, will obtain renal ultrasound, FeNA, urine creatinine, and inspection of urine for eosinophils  Dispo: Disposition is deferred at this time, awaiting improvement of current medical problems.  Anticipated discharge in approximately 1-2 day(s).   The patient does have a current PCP Rogers Blocker, MD) and does need an [facility] hospital follow-up appointment after discharge.  The patient does not have transportation limitations that hinder transportation to clinic appointments.  LOS: 1 day   Norval Gable, MD 08/19/2015, 11:42 AM

## 2015-08-20 DIAGNOSIS — T783XXA Angioneurotic edema, initial encounter: Secondary | ICD-10-CM | POA: Diagnosis not present

## 2015-08-20 DIAGNOSIS — N179 Acute kidney failure, unspecified: Secondary | ICD-10-CM | POA: Diagnosis not present

## 2015-08-20 LAB — GLUCOSE, CAPILLARY
GLUCOSE-CAPILLARY: 227 mg/dL — AB (ref 65–99)
GLUCOSE-CAPILLARY: 286 mg/dL — AB (ref 65–99)
Glucose-Capillary: 124 mg/dL — ABNORMAL HIGH (ref 65–99)
Glucose-Capillary: 219 mg/dL — ABNORMAL HIGH (ref 65–99)

## 2015-08-20 LAB — BASIC METABOLIC PANEL
ANION GAP: 11 (ref 5–15)
BUN: 59 mg/dL — AB (ref 6–20)
CALCIUM: 8.4 mg/dL — AB (ref 8.9–10.3)
CO2: 21 mmol/L — AB (ref 22–32)
Chloride: 103 mmol/L (ref 101–111)
Creatinine, Ser: 2.15 mg/dL — ABNORMAL HIGH (ref 0.61–1.24)
GFR calc Af Amer: 40 mL/min — ABNORMAL LOW (ref >60)
GFR calc non Af Amer: 35 mL/min — ABNORMAL LOW (ref >60)
GLUCOSE: 142 mg/dL — AB (ref 65–99)
POTASSIUM: 5.4 mmol/L — AB (ref 3.5–5.1)
Sodium: 135 mmol/L (ref 135–145)

## 2015-08-20 MED ORDER — SODIUM POLYSTYRENE SULFONATE 15 GM/60ML PO SUSP
30.0000 g | Freq: Once | ORAL | Status: AC
  Administered 2015-08-20: 30 g via ORAL
  Filled 2015-08-20: qty 120

## 2015-08-20 MED ORDER — INSULIN DETEMIR 100 UNIT/ML ~~LOC~~ SOLN: 40.0000 [IU] | Freq: Every day | SUBCUTANEOUS | Status: DC

## 2015-08-20 MED ORDER — PREDNISONE 20 MG PO TABS: 20.0000 mg | ORAL_TABLET | Freq: Every day | ORAL | Status: AC

## 2015-08-20 MED ORDER — DIPHENHYDRAMINE HCL 25 MG PO CAPS: 50.0000 mg | ORAL_CAPSULE | Freq: Once | ORAL | Status: DC

## 2015-08-20 MED ORDER — TRIAMCINOLONE ACETONIDE 0.1 % EX CREA: 1.0000 "application " | TOPICAL_CREAM | Freq: Two times a day (BID) | CUTANEOUS | Status: DC

## 2015-08-20 MED ORDER — DIPHENHYDRAMINE HCL 25 MG PO CAPS: 25.0000 mg | ORAL_CAPSULE | Freq: Three times a day (TID) | ORAL | Status: DC | PRN

## 2015-08-20 MED ORDER — DIPHENHYDRAMINE HCL 25 MG PO CAPS
25.0000 mg | ORAL_CAPSULE | Freq: Once | ORAL | Status: AC
  Administered 2015-08-20: 25 mg via ORAL
  Filled 2015-08-20: qty 1

## 2015-08-20 MED ORDER — PREDNISONE 20 MG PO TABS
20.0000 mg | ORAL_TABLET | Freq: Every day | ORAL | Status: DC
  Administered 2015-08-20: 20 mg via ORAL
  Filled 2015-08-20: qty 1

## 2015-08-20 NOTE — Discharge Summary (Signed)
Name: Austin Alexander MRN: [id number] DOB: Aug 31, 1970 45 y.o. PCP: Rogers Blocker, MD  Date of Admission: 08/18/2015  5:59 AM Date of Discharge: 08/20/2015 Attending Physician: No att. providers found  Discharge Diagnosis: 1. Acute diffuse urticarial reaction with angioedema 2. Acute kidney injury Principal Problem:   Allergic reaction Active Problems:   HTN (hypertension)   Tobacco abuse   DM type 2 (diabetes mellitus, type 2) (HCC)   Hyperglycemia   Hyponatremia   AKI (acute kidney injury) ([place])   Chronic systolic CHF (congestive heart failure) (HCC)   Hyperkalemia  Discharge Medications:   Medication List    STOP taking these medications        ivabradine 5 MG Tabs tablet  Commonly known as:  CORLANOR     sulfamethoxazole-trimethoprim 800-160 MG tablet  Commonly known as:  BACTRIM DS      TAKE these medications        aspirin 81 MG EC tablet  Take 1 tablet (81 mg total) by mouth daily.     blood glucose meter kit and supplies  Dispense based on patient and insurance preference. Use up to four times daily as directed. (FOR ICD-9 250.00, 250.01).     carvedilol 25 MG tablet  Commonly known as:  COREG  Take 25 mg by mouth 2 (two) times daily with a meal.     digoxin 0.25 MG tablet  Commonly known as:  LANOXIN  Take 0.25 mg by mouth daily.     diphenhydrAMINE 25 mg capsule  Commonly known as:  BENADRYL  Take 1 capsule (25 mg total) by mouth every 8 (eight) hours as needed for itching or allergies.     furosemide 40 MG tablet  Commonly known as:  LASIX  Take 1 tablet (40 mg total) by mouth daily.     glimepiride 4 MG tablet  Commonly known as:  AMARYL  Take 4 mg by mouth daily.     HYDROcodone-acetaminophen 5-325 MG tablet  Commonly known as:  NORCO  1-2 tabs po q6 hours prn pain     insulin detemir 100 UNIT/ML injection  Commonly known as:  LEVEMIR  Inject 0.4 mLs (40 Units total) into the skin daily.     multivitamin with minerals Tabs tablet  Take  1 tablet by mouth daily.     predniSONE 20 MG tablet  Commonly known as:  DELTASONE  Take 1 tablet (20 mg total) by mouth daily with breakfast.  Start taking on:  08/21/2015     simvastatin 40 MG tablet  Commonly known as:  ZOCOR  Take 1 tablet (40 mg total) by mouth daily.     triamcinolone cream 0.1 %  Commonly known as:  KENALOG  Apply 1 application topically 2 (two) times daily. Over itchy affected areas        Disposition and follow-up:   AustinRamie Alexander was discharged from [facility] in Good condition.  At the hospital follow up visit please address:  1.  Improvement in rash with prednisone, as needed Benadryl, and Kenalog? Glycemic control (consider increasing Levemir and adding bolus insulin per diabetes coordinator recommendations given A1c of 15). Considering restarting ivabradine per cardiology?  2.  Labs / imaging needed at time of follow-up: BMP  3.  Pending labs/ test needing follow-up: None  Follow-up Appointments:     Follow-up Information    Follow up with Rogers Blocker, MD In 1 week.   Specialty:  Internal Medicine   Why:  Hospital follow-up   Contact information:   [place] [address] [phone]       Go to Charolette Forward, MD.   Specialty:  Cardiology   Why:  As needed   Contact information:   [address] [place] [address] [phone]       Discharge Instructions: Discharge Instructions    Ambulatory referral to Nutrition and Diabetic Education    Complete by:  As directed      Diet - low sodium heart healthy    Complete by:  As directed      Increase activity slowly    Complete by:  As directed            Consultations:  None  Procedures Performed:  US Renal  08/19/2015  CLINICAL DATA:  Acute renal failure EXAM: RENAL / URINARY TRACT ULTRASOUND COMPLETE COMPARISON:  08/10/2014 FINDINGS: Right Kidney: Length: 11.6 cm. Echogenicity within normal limits. No mass or  hydronephrosis visualized. Left Kidney: Length: 13.0 cm. Echogenicity within normal limits. No mass or hydronephrosis visualized. Bladder: Appears normal for degree of bladder distention. IMPRESSION: Normal sonographic appearance of the kidneys in urinary bladder. Electronically Signed   By: Van Clines M.D.   On: 08/19/2015 19:43   Dg Chest Portable 1 View  08/18/2015  CLINICAL DATA:  Diabetic ketoacidosis EXAM: PORTABLE CHEST 1 VIEW COMPARISON:  April 06, 2014 FINDINGS: There is no edema or consolidation. Heart is mildly enlarged with pulmonary vascularity within normal limits. No adenopathy. No bone lesions. IMPRESSION: Stable cardiac enlargement.  No edema or consolidation. Electronically Signed   By: Lowella Grip III M.D.   On: 08/18/2015 08:32    2D Echo: None  Cardiac Cath: None  Admission HPI: [name] is a 45 year old male with PMH of DM type 2, chronic systolic HF, asthma here with complaint of N/V x 4, diarrhea x 3 yesterday, itching all over and felt like he might pass out. He noticed whelps on his legs, arms and wrists. He tried to take a shower and rub alcohol on his body but it did not help. When he woke up this morning around 4AM his lips, eyelids, hands/arms were swollen. No problems, breathing, swallowing or handling saliva. He ate at pizza place Saturday and went to a friend's house that is being renovated. He suspects he may have been allergic to something at his friend's house. He does not remember any insect bites. No new medication. He reports compliance with insulin 40 units daily. He started [place] a few months ago. He says he was recently seen Dr. Terrence Dupont a month ago and was told his heart function had improved and he is planning to change some his medications at his upcoming appointment. He smokes 1PPD x 30 years. EtOH on weekend, says he drank more than usual on Saturday night - 7 shots of liquor, "a few" beers. He denies illicit drug use.    Hospital Course by problem list: Principal Problem:   Allergic reaction Active Problems:   HTN (hypertension)   Tobacco abuse   DM type 2 (diabetes mellitus, type 2) (HCC)   Hyperglycemia   Hyponatremia   AKI (acute kidney injury) ([place])   Chronic systolic CHF (congestive heart failure) (HCC)   Hyperkalemia   1. Acute diffuse urticarial reaction with angioedema - patient with nausea, vomiting and diffuse urticaria as well as facial swelling and transient hypotension. The patient responded well to subcutaneous epinephrine, Benadryl, Pepcid, and Solu-Medrol  in the emergency department. On admission, he had no evidence of airway compromise and had no respiratory symptoms throughout his admission. Patient was admitted to stepdown for close monitoring of his airway. Given his history and presentation, the most likely cause of his symptoms was thought to be a delayed angioedema from his relatively new Entresto, with possible environmental exposures from his friends renovated house another possibility. His Entresto and Ivabradine were held given concern for angioedema. His symptoms resolved rapidly with the above measures, and he was continued on as needed Benadryl. He did have some recurrence of itching, so he was started on prednisone 20 mg daily for 5 days total along with Benadryl. He was also given Kenalog for pruritic lesions, with instructions for follow-up in one week with his primary care provider.  2. Acute kidney injury - patient presented with a creatinine of 5, while his usual baseline is in the low 1 range. Given that he only had some GI symptoms for 1 day, it was suspected that he may have acute kidney injury due to not only to some dehydration but also from the angioedema itself, with a possible worsening baseline from his poorly controlled diabetes. He was hydrated with IV fluids, with gradual improvement in his creatinine down to 2.2 on discharge. UA was unrevealing for cause. A renal  ultrasound was normal. A FENa was 1.6% suggesting an intrarenal process. No urine eosinophils were noted. He also had some mild hyperkalemia as his renal function recovered, and was given 1 dose of Kayexalate before discharge, with instructions for a creatinine and potassium recheck in one week with his PCP.  Discharge Vitals:   BP 140/93 mmHg  Pulse 82  Temp(Src) 97.8 F (36.6 C) (Oral)  Resp 24  Ht 6' (1.829 m)  Wt 259 lb 9.6 oz (117.754 kg)  BMI 35.20 kg/m2  SpO2 96%  Discharge Labs:  Results for orders placed or performed during the hospital encounter of 08/18/15 (from the past 24 hour(s))  Glucose, capillary     Status: Abnormal   Collection Time: 08/19/15  3:54 PM  Result Value Ref Range   Glucose-Capillary 338 (H) 65 - 99 mg/dL  Glucose, capillary     Status: Abnormal   Collection Time: 08/19/15  7:22 PM  Result Value Ref Range   Glucose-Capillary 392 (H) 65 - 99 mg/dL  Glucose, capillary     Status: Abnormal   Collection Time: 08/20/15 12:10 AM  Result Value Ref Range   Glucose-Capillary 219 (H) 65 - 99 mg/dL  Basic metabolic panel     Status: Abnormal   Collection Time: 08/20/15  3:40 AM  Result Value Ref Range   Sodium 135 135 - 145 mmol/L   Potassium 5.4 (H) 3.5 - 5.1 mmol/L   Chloride 103 101 - 111 mmol/L   CO2 21 (L) 22 - 32 mmol/L   Glucose, Bld 142 (H) 65 - 99 mg/dL   BUN 59 (H) 6 - 20 mg/dL   Creatinine, Ser 2.15 (H) 0.61 - 1.24 mg/dL   Calcium 8.4 (L) 8.9 - 10.3 mg/dL   GFR calc non Af Amer 35 (L) >60 mL/min   GFR calc Af Amer 40 (L) >60 mL/min   Anion gap 11 5 - 15  Glucose, capillary     Status: Abnormal   Collection Time: 08/20/15  3:55 AM  Result Value Ref Range   Glucose-Capillary 124 (H) 65 - 99 mg/dL  Glucose, capillary     Status: Abnormal   Collection Time:  08/20/15  9:12 AM  Result Value Ref Range   Glucose-Capillary 227 (H) 65 - 99 mg/dL  Glucose, capillary     Status: Abnormal   Collection Time: 08/20/15 12:38 PM  Result Value Ref Range     Glucose-Capillary 286 (H) 65 - 99 mg/dL    Signed: Norval Gable, MD 08/20/2015, 3:10 PM    Services Ordered on Discharge: None Equipment Ordered on Discharge: None

## 2015-08-20 NOTE — Progress Notes (Signed)
Pt started complaining of itching. RN went to check on pt. It looks like rash spreading on his back, left arm, underarm left leg and stomach area. DR Rice at bedside assess the pt. Pt's HR 99, NSR, BP 121/69, sats 98 room air. Will cont to monitor for any changes.

## 2015-08-20 NOTE — Progress Notes (Signed)
Inpatient Diabetes Program Recommendations  AACE/ADA: New Consensus Statement on Inpatient Glycemic Control (2015)  Target Ranges:  Prepandial:   less than 140 mg/dL      Peak postprandial:   less than 180 mg/dL (1-2 hours)      Critically ill patients:  140 - 180 mg/dL   Review of Glycemic Control:  Results for Patryk, GUARDADO (MRN [id number]) as of 08/20/2015 12:20  Ref. Range 08/19/2015 12:16 08/19/2015 15:54 08/19/2015 19:22 08/20/2015 00:10 08/20/2015 03:55 08/20/2015 09:12  Glucose-Capillary Latest Ref Range: 65-99 mg/dL 380 (H) 338 (H) 392 (H) 219 (H) 124 (H) 227 (H)    Inpatient Diabetes Program Recommendations:    Please consider increasing Levemir to home dose of 40 units daily and add Novolog 8 units meal coverage tid with meals.   Thanks, Adah Perl, RN, BC-ADM Inpatient Diabetes Coordinator Pager [phone] (8a-5p)

## 2015-08-20 NOTE — Progress Notes (Signed)
Subjective: [name] had no acute events overnight. This morning, he feels much better. He still has the itching and rash which are much relieved with benadryl and a hot shower, with some residual welts on his arms, legs, and back. He facial swelling is improved. He denies any respiratory symptoms or other new symptoms at this time and has no complaints.  Objective: Vital signs in last 24 hours: Filed Vitals:   08/20/15 0352 08/20/15 0400 08/20/15 0601 08/20/15 0930  BP: 122/76 106/76    Pulse: 80 82  82  Temp: 98 F (36.7 C)     TempSrc: Oral     Resp: 18     Height:      Weight:   259 lb 9.6 oz (117.754 kg)   SpO2: 95% 97%     Weight change: -1 lb 6.4 oz (-0.635 kg)  Intake/Output Summary (Last 24 hours) at 08/20/15 1012 Last data filed at 08/20/15 0932  Gross per 24 hour  Intake   1323 ml  Output   3000 ml  Net  -1677 ml     Gen: Well-appearing, alert and oriented to person, place, and time HEENT: Oropharynx clear without erythema or exudate.  Neck: No cervical LAD, no thyromegaly or nodules, no JVD noted. CV: Normal rate, regular rhythm, no murmurs, rubs, or gallops Pulmonary: Normal effort, CTA bilaterally, no crackles or wheezes Abdominal: Soft, non-tender, non-distended, without rebound, guarding, or masses Extremities: Distal pulses 2+ in upper and lower extremities bilaterally, no tenderness, erythema or edema Neuro: CN II-XII grossly intact, no focal weakness or sensory deficits noted Skin: No atypical appearing moles. Urticaria on extensor surfaces and upper back as well as inner left thigh, improved from previous exams.  Lab Results: Basic Metabolic Panel:  Recent Labs Lab 08/18/15 0630  08/19/15 0854 08/20/15 0340  NA 115*  < > 130* 135  K 6.2*  < > 4.8 5.4*  CL 81*  < > 96* 103  CO2 20*  < > 20* 21*  GLUCOSE 577*  < > 167* 142*  BUN 52*  < > 62* 59*  CREATININE 4.96*  < > 3.35* 2.15*  CALCIUM 8.5*  < > 8.4* 8.4*  MG 2.1  --   --   --   < > =  values in this interval not displayed.  Hemoglobin A1C:  Recent Labs Lab 08/18/15 1149  HGBA1C 15.2*   Fasting Lipid Panel:  Recent Labs Lab 08/18/15 1532  TRIG 392*   Urinalysis:  Recent Labs Lab 08/18/15 1110  COLORURINE YELLOW  LABSPEC 1.023  PHURINE 5.0  GLUCOSEU >1000*  HGBUR SMALL*  BILIRUBINUR NEGATIVE  KETONESUR NEGATIVE  PROTEINUR 30*  NITRITE NEGATIVE  LEUKOCYTESUR NEGATIVE   Studies/Results: US Renal  08/19/2015  CLINICAL DATA:  Acute renal failure EXAM: RENAL / URINARY TRACT ULTRASOUND COMPLETE COMPARISON:  08/10/2014 FINDINGS: Right Kidney: Length: 11.6 cm. Echogenicity within normal limits. No mass or hydronephrosis visualized. Left Kidney: Length: 13.0 cm. Echogenicity within normal limits. No mass or hydronephrosis visualized. Bladder: Appears normal for degree of bladder distention. IMPRESSION: Normal sonographic appearance of the kidneys in urinary bladder. Electronically Signed   By: Van Clines M.D.   On: 08/19/2015 19:43   Assessment/Plan: 1. Allergic reaction w/ angioedema: Patient presented c/o diffuse pruritis and urticaria since yesterday and woke up with swelling this AM and hypotension.He has responded significantly to epinephrine, benadryl, famotidine, steroid. The urticaria has nearly resolved.He has no symptoms of airway involvement. He takes entresto (contains an  ARB), started ~3 months ago so this may be a delayed angioedema, but he also has acute exposure to dust/fumes the day before symptoms started which may be responsible for this. - Continue diphenhydramine 25mg  PO PRN -Added prednisone 20mg  PO x 5 days starting today -Will send home with Kenolog cream PRN - Monitor for airway involvement - IV fluids - Hold entresto and ivabradine for now (both can precipitate angioedema) - will discuss with his cardiologist regarding re-challenging as an outpatient  if this turns out to be all mast cell mediated (as opposed to bradykinin  mediated)  2. AKI: Baseline Cr ~ 1.2. Cr 4.9 on admission, with gradual improvement in his Cr, now 2.2. This may be more than simple dehydration, possibly an intrarenal process related to presumed angioedema/hypersensitivity vs worsening baseline CKD given his terribly-controlled diabetes (A1c here 15). FeNA shows intrarenal process (1.6%), no eosinophils noted but still pending.  - hydration as above - trend BMP, I/O -Recheck Cr and K in 1 week at PCP office   Dispo: Disposition is deferred at this time, awaiting improvement of current medical problems.  Anticipated discharge in approximately 1-2 day(s).   The patient does have a current PCP Rogers Blocker, MD) and does need an [facility] hospital follow-up appointment after discharge.  The patient does not have transportation limitations that hinder transportation to clinic appointments.  LOS: 2 days   Austin Gable, MD 08/20/2015, 10:12 AM

## 2015-08-20 NOTE — Progress Notes (Signed)
Patient discharged home via wheelchair to personal automobile. Patient IV's removed and discharge teaching performed. Patient advised to follow-up with primary doctor and cardiologist regarding medications/hospital visit. Further advised to make sure he picks up his medications: benadryl, kenalog cream, and prednisone from the pharmacy. CCMD notified of discharge.  Milford Cage, RN

## 2015-08-20 NOTE — Discharge Instructions (Addendum)
Mr. Heyman,  Keep taking the prednisone (starting tomorrow) once daily for 4 more days (until Sunday 08/24/15), regardless of whether you feel itchy or not. Take the benadryl as needed for itching, and apply the cream to itchy areas as well twice daily.  Please do not restart the Entresto or the Corlanor until you see Dr. Terrence Dupont next in clinic. You can discuss with him whether or not to restart the medications, when, and how to do so.  Also make sure to see your primary doctor in the next 1 week to recheck your kidney function.  I'm happy you're feeling better, Blane Ohara

## 2015-08-23 LAB — CULTURE, BLOOD (ROUTINE X 2)
Culture: NO GROWTH
Culture: NO GROWTH

## 2015-09-01 DIAGNOSIS — L03012 Cellulitis of left finger: Secondary | ICD-10-CM | POA: Insufficient documentation

## 2015-09-15 ENCOUNTER — Encounter: Payer: Commercial Managed Care - HMO | Attending: Internal Medicine | Admitting: Skilled Nursing Facility1

## 2015-09-15 ENCOUNTER — Encounter: Payer: Self-pay | Admitting: Skilled Nursing Facility1

## 2015-09-15 VITALS — Ht 72.0 in | Wt 256.0 lb

## 2015-09-15 DIAGNOSIS — E119 Type 2 diabetes mellitus without complications: Secondary | ICD-10-CM | POA: Diagnosis present

## 2015-09-15 DIAGNOSIS — E118 Type 2 diabetes mellitus with unspecified complications: Secondary | ICD-10-CM

## 2015-09-15 NOTE — Progress Notes (Signed)
Diabetes Self-Management Education  Visit Type: First/Initial  Appt. Start Time: 2:00 Appt. End Time: 3:30  09/15/2015  [name], identified by name and date of birth, is a 45 y.o. male with a diagnosis of Diabetes: Type 2.   ASSESSMENT  Height 6' (1.829 m), weight 256 lb (116.121 kg). Body mass index is 34.71 kg/(m^2).      Diabetes Self-Management Education - 09/15/15 1410    Visit Information   Visit Type First/Initial   Initial Visit   Diabetes Type Type 2   Are you currently following a meal plan? No   Are you taking your medications as prescribed? Yes   Date Diagnosed Years ago   Health Coping   How would you rate your overall health? Good   Psychosocial Assessment   Patient Belief/Attitude about Diabetes Motivated to manage diabetes   Self-care barriers None   Other persons present Family Member   Patient Concerns Nutrition/Meal planning   Pre-Education Assessment   Patient understands the diabetes disease and treatment process. Needs Instruction   Patient understands incorporating nutritional management into lifestyle. Needs Instruction   Patient undertands incorporating physical activity into lifestyle. Needs Instruction   Patient understands using medications safely. Needs Instruction   Patient understands monitoring blood glucose, interpreting and using results Needs Instruction   Patient understands prevention, detection, and treatment of acute complications. Needs Instruction   Patient understands prevention, detection, and treatment of chronic complications. Needs Instruction   Patient understands how to develop strategies to address psychosocial issues. Needs Instruction   Patient understands how to develop strategies to promote health/change behavior. Needs Instruction   Complications   Last HgB A1C per patient/outside source 15.5 %   How often do you check your blood sugar? 0 times/day (not testing)   Have you had a dilated eye exam in the past 12  months? No   Have you had a dental exam in the past 12 months? Yes   Are you checking your feet? Yes   How many days per week are you checking your feet? 7   Dietary Intake   Breakfast chicken biscuit (fast food)   Lunch fast food   Snack (afternoon) chips   Dinner frozen meals   Snack (evening) popcorn   Beverage(s) coffee, water, juice, lemonade, alcoholic beverages    Exercise   Exercise Type Light (walking / raking leaves)   Patient Education   Previous Diabetes Education Yes (please comment)  in the hospital   Disease state  Definition of diabetes, type 1 and 2, and the diagnosis of diabetes;Factors that contribute to the development of diabetes   Nutrition management  Role of diet in the treatment of diabetes and the relationship between the three main macronutrients and blood glucose level;Food label reading, portion sizes and measuring food.;Carbohydrate counting;Reviewed blood glucose goals for pre and post meals and how to evaluate the patients' food intake on their blood glucose level.;Effects of alcohol on blood glucose and safety factors with consumption of alcohol.;Information on hints to eating out and maintain blood glucose control.;Meal options for control of blood glucose level and chronic complications.   Physical activity and exercise  Role of exercise on diabetes management, blood pressure control and cardiac health.;Identified with patient nutritional and/or medication changes necessary with exercise.;Helped patient identify appropriate exercises in relation to his/her diabetes, diabetes complications and other health issue.   Monitoring Purpose and frequency of SMBG.;Yearly dilated eye exam;Daily foot exams;Identified appropriate SMBG and/or A1C goals.   Acute complications Taught  treatment of hypoglycemia - the 15 rule.;Discussed and identified patients' treatment of hyperglycemia.   Chronic complications Relationship between chronic complications and blood glucose  control;Dental care;Lipid levels, blood glucose control and heart disease;Reviewed with patient heart disease, higher risk of, and prevention;Retinopathy and reason for yearly dilated eye exams;Identified and discussed with patient  current chronic complications;Assessed and discussed foot care and prevention of foot problems   Individualized Goals (developed by patient)   Nutrition Follow meal plan discussed;Adjust meds/carbs with exercise as discussed   Physical Activity Exercise 1-2 times per week;15 minutes per day   Monitoring  test blood glucose pre and post meals as discussed   Reducing Risk treat hypoglycemia with 15 grams of carbs if blood glucose less than 70mg /dL;increase portions of healthy fats;increase portions of olive oil in diet;do foot checks daily;increase portions of nuts and seeds   Post-Education Assessment   Patient understands the diabetes disease and treatment process. Demonstrates understanding / competency   Patient understands incorporating nutritional management into lifestyle. Demonstrates understanding / competency   Patient undertands incorporating physical activity into lifestyle. Demonstrates understanding / competency   Patient understands using medications safely. Demonstrates understanding / competency   Patient understands monitoring blood glucose, interpreting and using results Demonstrates understanding / competency   Patient understands prevention, detection, and treatment of acute complications. Demonstrates understanding / competency   Patient understands prevention, detection, and treatment of chronic complications. Demonstrates understanding / competency   Patient understands how to develop strategies to address psychosocial issues. Demonstrates understanding / competency   Patient understands how to develop strategies to promote health/change behavior. Demonstrates understanding / competency   Outcomes   Expected Outcomes Demonstrated interest in  learning. Expect positive outcomes   Future DMSE PRN   Program Status Completed      Individualized Plan for Diabetes Self-Management Training:   Learning Objective:  Patient will have a greater understanding of diabetes self-management. Patient education plan is to attend individual and/or group sessions per assessed needs and concerns.   Plan:   There are no Patient Instructions on file for this visit.  Expected Outcomes:  Demonstrated interest in learning. Expect positive outcomes  Education material provided: Living Well with Diabetes, Meal plan card and Snack sheet  If problems or questions, patient to contact team via:  Phone  Future DSME appointment: PRN

## 2015-11-02 IMAGING — DX DG HAND COMPLETE 3+V*L*
3 series · 3 of 3 positions shown · non-contrast
Comparison: LEFT hand radiograph June 06, 2014

CLINICAL DATA: Piece of Penyo Kumkani in LEFT hand for 9 months, pain
and swelling and recent infection.

EXAM:
LEFT HAND - COMPLETE 3+ VIEW

[hand pa]
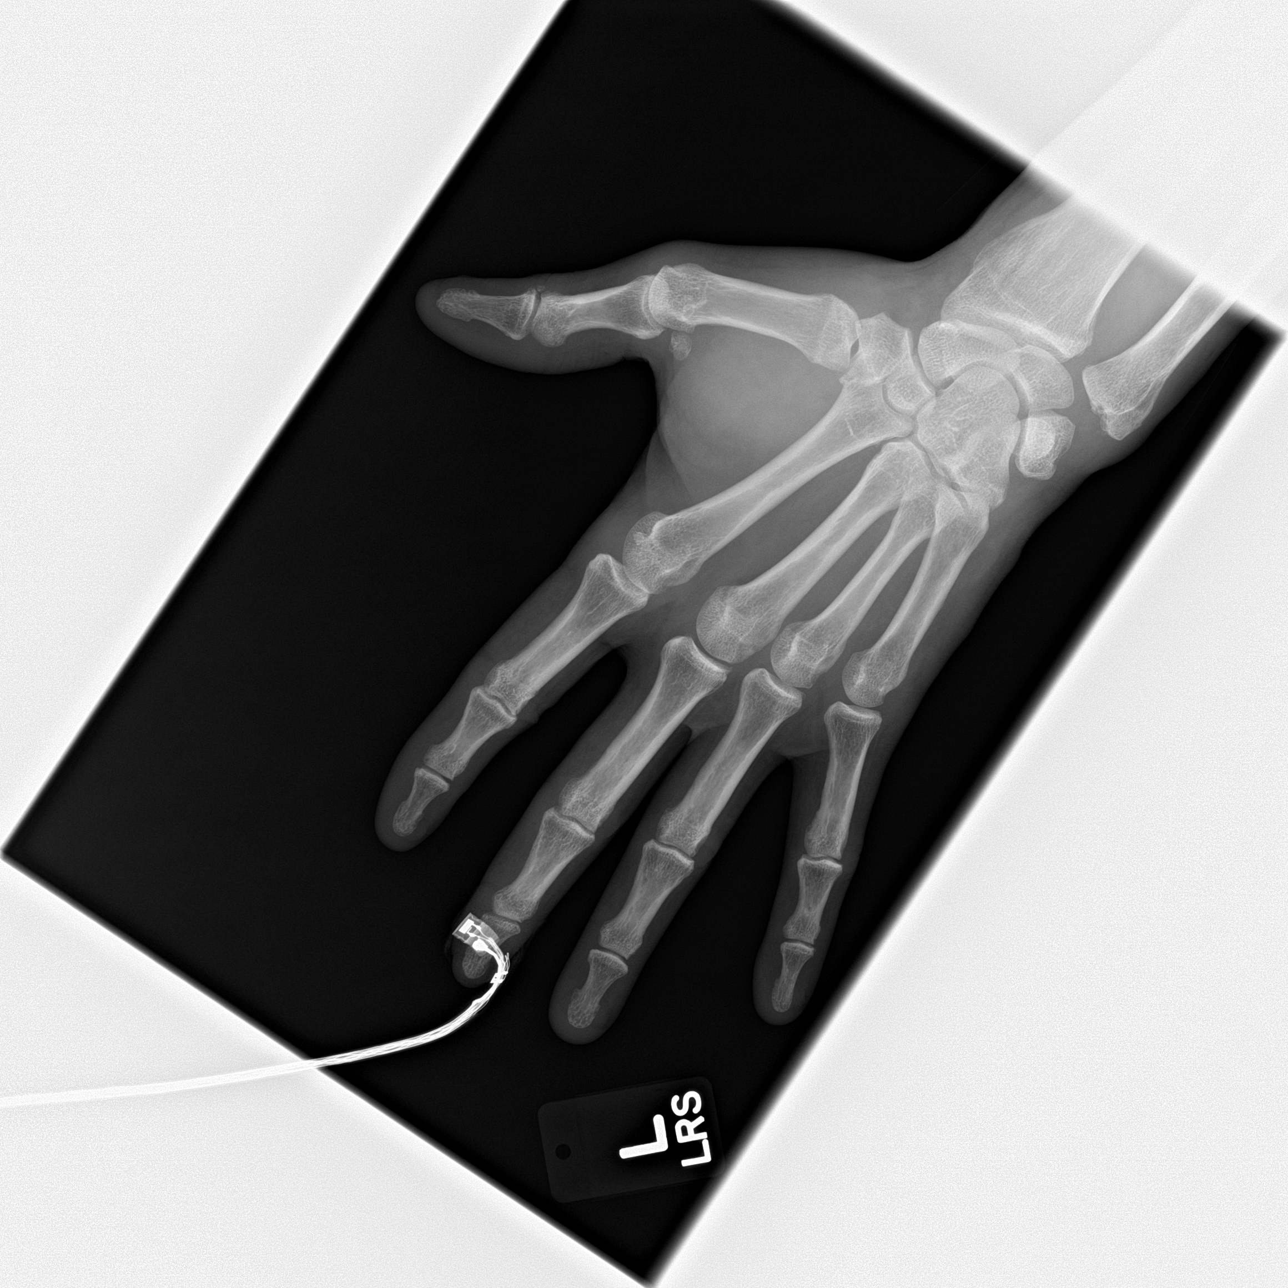

[hand obl]
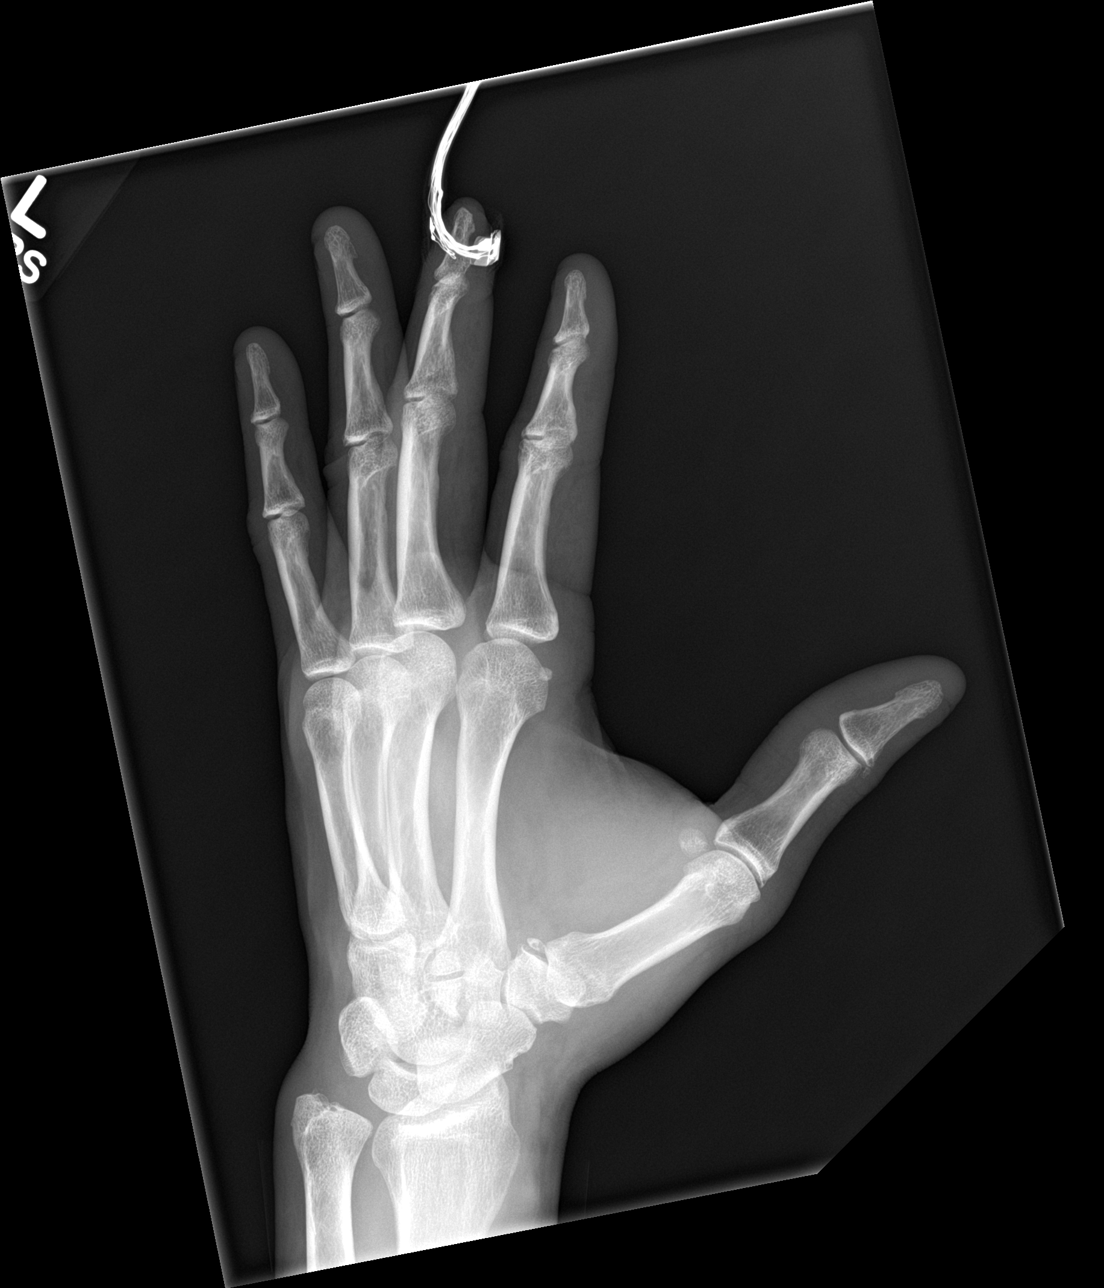

[hand lat]
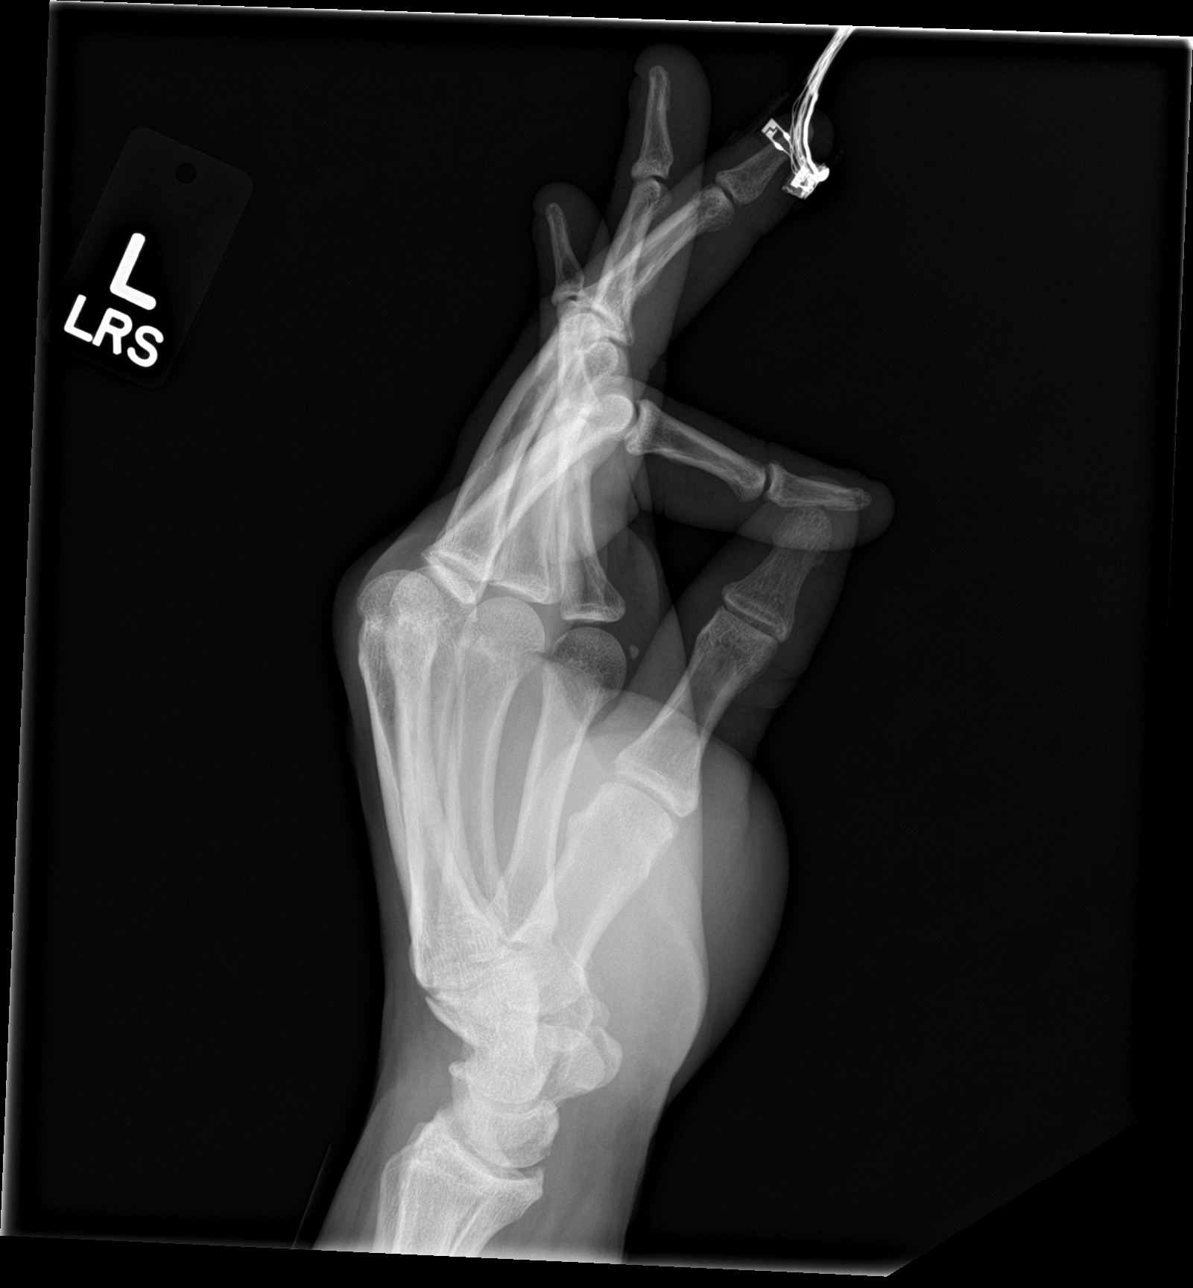

[3 of 3 positions shown; findings below may reference images not displayed]

FINDINGS: There is no evidence of fracture or dislocation. Corticated bony
fragment at first carpometacarpal joint space may reflect remote
injury. Widened radial ulnar joint space with blunted ulnar styloid
most consistent with remote injury. There is no evidence of
arthropathy or other focal bone abnormality. Persistent thenar soft
tissue swelling without subcutaneous gas or radiopaque foreign
bodies. Please note, would foreign body typically demonstrates air
density.
IMPRESSION: Persistent thenar soft tissue swelling concerning for cellulitis
without radiopaque foreign bodies identified. No destructive bony
lesions.

  By: Polin Billiot

## 2016-07-28 ENCOUNTER — Ambulatory Visit (INDEPENDENT_AMBULATORY_CARE_PROVIDER_SITE_OTHER): Payer: Commercial Managed Care - HMO | Admitting: Podiatry

## 2016-07-28 DIAGNOSIS — E0842 Diabetes mellitus due to underlying condition with diabetic polyneuropathy: Secondary | ICD-10-CM | POA: Diagnosis not present

## 2016-07-28 DIAGNOSIS — L608 Other nail disorders: Secondary | ICD-10-CM | POA: Diagnosis not present

## 2016-07-28 DIAGNOSIS — L603 Nail dystrophy: Secondary | ICD-10-CM

## 2016-07-28 DIAGNOSIS — M79609 Pain in unspecified limb: Secondary | ICD-10-CM | POA: Diagnosis not present

## 2016-07-28 DIAGNOSIS — B351 Tinea unguium: Secondary | ICD-10-CM

## 2016-07-29 NOTE — Progress Notes (Signed)
   SUBJECTIVE Patient with a history of diabetes mellitus presents to office today complaining of elongated, thickened nails. Pain while ambulating in shoes. Patient is unable to trim their own nails.   OBJECTIVE General Patient is awake, alert, and oriented x 3 and in no acute distress. Derm Skin is dry and supple bilateral. Negative open lesions or macerations. Remaining integument unremarkable. Nails are tender, long, thickened and dystrophic with subungual debris, consistent with onychomycosis, 1-5 bilateral. No signs of infection noted. Vasc  DP and PT pedal pulses palpable bilaterally. Temperature gradient within normal limits.  Neuro Epicritic and protective threshold sensation diminished bilaterally.  Musculoskeletal Exam No symptomatic pedal deformities noted bilateral. Muscular strength within normal limits.  ASSESSMENT 1. Diabetes Mellitus w/ peripheral neuropathy 2. Onychomycosis of nail due to dermatophyte bilateral 3. Pain in foot bilateral  PLAN OF CARE 1. Patient evaluated today. 2. Instructed to maintain good pedal hygiene and foot care. Stressed importance of controlling blood sugar.  3. Mechanical debridement of nails 1-5 bilaterally performed using a nail nipper. Filed with dremel without incident.  4. Return to clinic in one year.     Edrick Kins, DPM [facility]  Dr. Edrick Kins, [address]                                        [place], [place] [address]                Office [phone]  Fax [phone]

## 2017-05-20 ENCOUNTER — Ambulatory Visit: Payer: 59 | Admitting: Sports Medicine

## 2017-05-20 ENCOUNTER — Encounter: Payer: Self-pay | Admitting: Sports Medicine

## 2017-05-20 VITALS — BP 122/82 | HR 88 | Temp 98.3°F | Ht 72.0 in | Wt 282.2 lb

## 2017-05-20 DIAGNOSIS — M79661 Pain in right lower leg: Secondary | ICD-10-CM | POA: Diagnosis not present

## 2017-05-20 DIAGNOSIS — I872 Venous insufficiency (chronic) (peripheral): Secondary | ICD-10-CM | POA: Diagnosis not present

## 2017-05-20 DIAGNOSIS — M7989 Other specified soft tissue disorders: Secondary | ICD-10-CM

## 2017-05-20 LAB — COMPREHENSIVE METABOLIC PANEL
AG Ratio: 1 (calc) (ref 1.0–2.5)
ALT: 14 U/L (ref 9–46)
AST: 11 U/L (ref 10–40)
Albumin: 3.7 g/dL (ref 3.6–5.1)
Alkaline phosphatase (APISO): 111 U/L (ref 40–115)
BUN/Creatinine Ratio: 18 (calc) (ref 6–22)
BUN: 30 mg/dL — AB (ref 7–25)
CHLORIDE: 95 mmol/L — AB (ref 98–110)
CO2: 25 mmol/L (ref 20–32)
CREATININE: 1.65 mg/dL — AB (ref 0.60–1.35)
Calcium: 8.8 mg/dL (ref 8.6–10.3)
GLOBULIN: 3.8 g/dL — AB (ref 1.9–3.7)
Glucose, Bld: 260 mg/dL — ABNORMAL HIGH (ref 65–99)
Potassium: 5.4 mmol/L — ABNORMAL HIGH (ref 3.5–5.3)
Sodium: 130 mmol/L — ABNORMAL LOW (ref 135–146)
Total Bilirubin: 0.4 mg/dL (ref 0.2–1.2)
Total Protein: 7.5 g/dL (ref 6.1–8.1)

## 2017-05-20 LAB — CBC WITH DIFFERENTIAL/PLATELET
Basophils Absolute: 51 cells/uL (ref 0–200)
Basophils Relative: 0.5 %
EOS ABS: 775 {cells}/uL — AB (ref 15–500)
Eosinophils Relative: 7.6 %
HCT: 34.3 % — ABNORMAL LOW (ref 38.5–50.0)
Hemoglobin: 11.7 g/dL — ABNORMAL LOW (ref 13.2–17.1)
Lymphs Abs: 1642 cells/uL (ref 850–3900)
MCH: 28.5 pg (ref 27.0–33.0)
MCHC: 34.1 g/dL (ref 32.0–36.0)
MCV: 83.5 fL (ref 80.0–100.0)
MONOS PCT: 5.6 %
MPV: 10.4 fL (ref 7.5–12.5)
NEUTROS PCT: 70.2 %
Neutro Abs: 7160 cells/uL (ref 1500–7800)
PLATELETS: 356 10*3/uL (ref 140–400)
RBC: 4.11 10*6/uL — ABNORMAL LOW (ref 4.20–5.80)
RDW: 13.7 % (ref 11.0–15.0)
TOTAL LYMPHOCYTE: 16.1 %
WBC: 10.2 10*3/uL (ref 3.8–10.8)
WBCMIX: 571 {cells}/uL (ref 200–950)

## 2017-05-20 NOTE — Progress Notes (Signed)
Austin Alexander. Austin Alexander, Austin Alexander  Austin Alexander - 47 y.o. male MRN [id number]  Date of birth: 1970/11/24  Visit Date: 05/20/2017  PCP: Rogers Blocker, MD   Referred by: Rogers Blocker, MD   Scribe for today's visit: Josepha Pigg, CMA     SUBJECTIVE:  Austin Alexander is here for New Patient (Initial Visit) (RT leg pain)  His RT leg pain symptoms INITIALLY: Began about 1 week ago. He cannot recall exact MOI but thinks he may have hit his shin on something. He also recalls that area of his leg itching and he did scratch it. He doesn't recall anything biting him.  Described as mild tenderness to palpation, nonradiating. Initially there area was throbbing but that has subsided.  Worsened with touch Improved with time Additional associated symptoms include: There is bruising present on the shin. There is also swelling present. The leg does not appear to have increased warmth but erythema is present. The "bruise" seems to be getting better over time but the swelling has been about the same. He denies drainage around the area. He denies fever, chills, or night sweats.     At this time symptoms show no change compared to onset He has been massaging the area with lotion to keep it from getting too dry.   ROS Denies night time disturbances. Denies fevers, chills, or night sweats. Denies unexplained weight loss. Denies personal history of cancer. Denies changes in bowel or bladder habits. Denies recent unreported falls. Denies new or worsening dyspnea or wheezing. Denies headaches or dizziness.  Denies numbness, tingling or weakness  In the extremities.  Denies dizziness or presyncopal episodes Reports lower extremity edema     HISTORY & PERTINENT PRIOR DATA:  Prior History reviewed and updated per electronic medical record.  Significant/pertinent history, findings, studies include:  reports that he has been smoking  cigarettes.  He has a 20.00 pack-year smoking history. He has never used smokeless tobacco. No results for input(s): HGBA1C, LABURIC, CREATINE in the last 8760 hours. No specialty comments available. No problems updated.  OBJECTIVE:  VS:  HT:6' (182.9 cm)   WT:282 lb 3.2 oz (128 kg)  BMI:38.26    BP:122/82  HR:88bpm  TEMP:98.3 F (36.8 C)(Oral)  RESP:95 %   PHYSICAL EXAM: Constitutional: WDWN, Non-toxic appearing. Psychiatric: Alert & appropriately interactive.  Not depressed or anxious appearing. Respiratory: No increased work of breathing.  Trachea Midline Eyes: Pupils are equal.  EOM intact without nystagmus.  No scleral icterus  Vascular Exam: warm to touch trace pedal edema DP & PT Pulses: normal calf supple with no pain with squeeze  lower extremity neuro exam: unremarkable  MSK Exam: Right calf has had marked improvements in swelling.  He does still have some distal swelling right greater than left but improved compared to the past.  He has overlying brawny venous stasis changes consistent with venous stasis insufficiency.   ASSESSMENT & PLAN:   1. Pain and swelling of right lower leg   2. Venous stasis dermatitis of right lower extremity     PLAN: Compression socks discussed.  Given the low likelihood of DVT but the possibility we will go ahead and obtain a formal Doppler study as well as obtain basic blood work for consideration of treatment options.  I suspect this is more related to gravity and dependent nature of this but will try to rule out possibility of infection with basic blood work.  Follow-up: Return for after the Ultrasound.  To further discuss the new findings.    Please see additional documentation for Objective, Assessment and Plan sections. Pertinent additional documentation may be included in corresponding procedure notes, imaging studies, problem based documentation and patient instructions. Please see these sections of the encounter for  additional information regarding this visit.  CMA/ATC served as Education administrator during this visit. History, Physical, and Plan performed by medical provider. Documentation and orders reviewed and attested to.      Gerda Diss, [facility] Physician

## 2017-05-24 ENCOUNTER — Ambulatory Visit (HOSPITAL_COMMUNITY)
Admission: RE | Admit: 2017-05-24 | Discharge: 2017-05-24 | Disposition: A | Discharge status: Home / Self Care | Payer: 59 | Source: Ambulatory Visit | Attending: Vascular Surgery | Admitting: Vascular Surgery

## 2017-05-24 DIAGNOSIS — M7989 Other specified soft tissue disorders: Secondary | ICD-10-CM

## 2017-05-24 DIAGNOSIS — I872 Venous insufficiency (chronic) (peripheral): Secondary | ICD-10-CM | POA: Insufficient documentation

## 2017-05-24 DIAGNOSIS — I82441 Acute embolism and thrombosis of right tibial vein: Secondary | ICD-10-CM | POA: Diagnosis not present

## 2017-05-24 DIAGNOSIS — M79661 Pain in right lower leg: Secondary | ICD-10-CM

## 2017-05-25 ENCOUNTER — Ambulatory Visit: Payer: 59 | Admitting: Sports Medicine

## 2017-05-25 ENCOUNTER — Encounter: Payer: Self-pay | Admitting: Sports Medicine

## 2017-05-25 VITALS — BP 142/88 | HR 82 | Ht 72.0 in | Wt 284.6 lb

## 2017-05-25 DIAGNOSIS — M79661 Pain in right lower leg: Secondary | ICD-10-CM | POA: Diagnosis not present

## 2017-05-25 DIAGNOSIS — I824Z1 Acute embolism and thrombosis of unspecified deep veins of right distal lower extremity: Secondary | ICD-10-CM | POA: Diagnosis not present

## 2017-05-25 DIAGNOSIS — I872 Venous insufficiency (chronic) (peripheral): Secondary | ICD-10-CM

## 2017-05-25 DIAGNOSIS — M7989 Other specified soft tissue disorders: Secondary | ICD-10-CM | POA: Diagnosis not present

## 2017-05-25 NOTE — Progress Notes (Signed)
Austin Alexander. Austin Alexander, [place] [facility] [place]  Austin Alexander - 47 y.o. male MRN [id number]  Date of birth: [birth year]  Visit Date: 05/25/2017  PCP: Rogers Blocker, MD   Referred by: Rogers Blocker, MD   Scribe for today's visit: Wendy Poet, LAT, ATC     SUBJECTIVE:  Austin Alexander is here for Follow-up (R lower leg pain and Post Tib DVT) .   Notes from initial visit on 05/20/17: His RT leg pain symptoms INITIALLY: Began about 1 week ago. He cannot recall exact MOI but thinks he may have hit his shin on something. He also recalls that area of his leg itching and he did scratch it. He doesn't recall anything biting him.  Described as mild tenderness to palpation, nonradiating. Initially there area was throbbing but that has subsided.  Worsened with touch Improved with time Additional associated symptoms include: There is bruising present on the shin. There is also swelling present. The leg does not appear to have increased warmth but erythema is present. The "bruise" seems to be getting better over time but the swelling has been about the same. He denies drainage around the area. He denies fever, chills, or night sweats.     At this time symptoms show no change compared to onset He has been massaging the area with lotion to keep it from getting too dry.    Compared to the last office visit on 05/20/17, his previously described R lower leg symptoms show no change  Current symptoms are mild & are nonradiating He has gotten a lower extremity doppler and is here to discuss the results as well as next steps in treatment process.   ROS Denies night time disturbances. Denies fevers, chills, or night sweats. Denies unexplained weight loss. Denies personal history of cancer. Denies changes in bowel or bladder habits. Denies recent unreported falls. Denies new or worsening dyspnea or wheezing. Denies headaches or dizziness.  Denies  numbness, tingling or weakness  In the extremities.  Denies dizziness or presyncopal episodes Reports lower extremity edema     HISTORY & PERTINENT PRIOR DATA:  Prior History reviewed and updated per electronic medical record.  Significant/pertinent history, findings, studies include:  reports that he has been smoking cigarettes.  He has a 20.00 pack-year smoking history. He has never used smokeless tobacco. No results for input(s): HGBA1C, LABURIC, CREATINE in the last 8760 hours. No specialty comments available. No problems updated.  OBJECTIVE:  VS:  HT:6' (182.9 cm)   WT:284 lb 9.6 oz (129.1 kg)  BMI:38.59    BP:(Abnormal) 142/88  HR:82bpm  TEMP: ( )  RESP:96 %   PHYSICAL EXAM: Constitutional: WDWN, Non-toxic appearing. Psychiatric: Alert & appropriately interactive.  Not depressed or anxious appearing. Respiratory: No increased work of breathing.  Trachea Midline Eyes: Pupils are equal.  EOM intact without nystagmus.  No scleral icterus  Vascular Exam: warm to touch no edema  lower extremity neuro exam: unremarkable normal strength normal sensation normal reflexes  Right Lower Extremity:  2+ pitting edema R LE with 1+ on the left Minimal pain   ASSESSMENT & PLAN:   1. Pain and swelling of right lower leg   2. Venous stasis dermatitis of right lower extremity   3. Acute deep vein thrombosis (DVT) of distal vein of right lower extremity (HCC)     PLAN:  Given the findings we had a long discussion regarding options.  Given the lack of evidence  for superficial DVTs anticoagulation with aspirin only will be appropriate as long as there is no additional propagation of the 2-week mark we will plan to repeat the ultrasound at that time.  >50% of this 25 minute visit spent in direct patient counseling and/or coordination of care.  Discussion was focused on education regarding the in discussing the pathoetiology and anticipated clinical course of the above  condition.   Follow-up: Return in about 3 weeks (around 06/15/2017).      Please see additional documentation for Objective, Assessment and Plan sections. Pertinent additional documentation may be included in corresponding procedure notes, imaging studies, problem based documentation and patient instructions. Please see these sections of the encounter for additional information regarding this visit.  CMA/ATC served as Education administrator during this visit. History, Physical, and Plan performed by medical provider. Documentation and orders reviewed and attested to.      Gerda Diss, [facility] Physician

## 2017-05-25 NOTE — Patient Instructions (Signed)
Take 1 baby aspirin twice a day until I see you again  If you have any new shortness of breath, worsening swelling or worsening leg pain I need to hear from you immediately  Wear your compression socks when you are on your feet

## 2017-06-07 ENCOUNTER — Ambulatory Visit (HOSPITAL_COMMUNITY)
Admission: RE | Admit: 2017-06-07 | Discharge: 2017-06-07 | Disposition: A | Discharge status: Home / Self Care | Payer: 59 | Source: Ambulatory Visit | Attending: Vascular Surgery | Admitting: Vascular Surgery

## 2017-06-07 ENCOUNTER — Telehealth: Payer: Self-pay | Admitting: Sports Medicine

## 2017-06-07 DIAGNOSIS — I824Z1 Acute embolism and thrombosis of unspecified deep veins of right distal lower extremity: Secondary | ICD-10-CM

## 2017-06-07 NOTE — Telephone Encounter (Signed)
Austin Alexander, Vascular Technologist, called and reported "previously documented DVT not seen today"; original test ordered by Dr Teresa Coombs at [facility]; will route to office for notification of this result.

## 2017-06-07 NOTE — Telephone Encounter (Signed)
See note

## 2017-06-08 NOTE — Telephone Encounter (Signed)
Called pt and advised, pt verbalized understanding.

## 2017-06-15 ENCOUNTER — Ambulatory Visit: Payer: 59 | Admitting: Sports Medicine

## 2017-06-22 ENCOUNTER — Encounter: Payer: Self-pay | Admitting: Sports Medicine

## 2017-07-10 ENCOUNTER — Encounter: Payer: Self-pay | Admitting: Sports Medicine

## 2017-09-22 LAB — HEMOGLOBIN A1C: HEMOGLOBIN A1C: 12.7

## 2017-09-22 LAB — BASIC METABOLIC PANEL
CREATININE: 1.5 — AB (ref 0.6–1.3)
GLUCOSE: 311
Potassium: 4.8 (ref 3.4–5.3)

## 2017-09-22 LAB — LIPID PANEL
Cholesterol: 153 (ref 0–200)
HDL: 31 — AB (ref 35–70)
TRIGLYCERIDES: 549 — AB (ref 40–160)

## 2017-11-07 ENCOUNTER — Encounter (HOSPITAL_COMMUNITY): Payer: Self-pay | Admitting: Emergency Medicine

## 2017-11-07 ENCOUNTER — Ambulatory Visit (HOSPITAL_COMMUNITY)
Admission: EM | Admit: 2017-11-07 | Discharge: 2017-11-07 | Disposition: A | Discharge status: Home / Self Care | Payer: 59 | Attending: Family Medicine | Admitting: Family Medicine

## 2017-11-07 ENCOUNTER — Other Ambulatory Visit: Payer: Self-pay

## 2017-11-07 DIAGNOSIS — L0201 Cutaneous abscess of face: Secondary | ICD-10-CM

## 2017-11-07 MED ORDER — NEOMYCIN-POLYMYXIN-HC 3.5-10000-1 OT SUSP: 4.0000 [drp] | Freq: Four times a day (QID) | OTIC | 0 refills | Status: DC

## 2017-11-07 MED ORDER — SULFAMETHOXAZOLE-TRIMETHOPRIM 800-160 MG PO TABS: 1.0000 | ORAL_TABLET | Freq: Two times a day (BID) | ORAL | 0 refills | Status: AC

## 2017-11-07 MED ORDER — LIDOCAINE-EPINEPHRINE (PF) 2 %-1:200000 IJ SOLN
INTRAMUSCULAR | Status: AC
  Filled 2017-11-07: qty 20

## 2017-11-07 NOTE — ED Provider Notes (Signed)
[facility]    CSN: [id number] Arrival date & time: 11/07/17  1307     History   Chief Complaint Chief Complaint  Patient presents with  . Otalgia    HPI Austin Alexander is a 47 y.o. male.   HPI  sWelling and pain around right ear for 2 days.  Getting worse.  Currently painful.  Hearing is diminished on the right ear.  No drainage from the ear.  No cough or cold symptoms.  No prior ear problems.  Past Medical History:  Diagnosis Date  . 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 Abscess of perineum   . Alcohol abuse   . Asthma    History of,  . Bronchitis   . CHF (congestive heart failure) (HCC)   . COPD (chronic obstructive pulmonary disease) (HCC)    moderate obstruction with low vital capacity  . Croup   . Cystic acne    with acne keloidosis  . Diabetes mellitus without complication (HCC)    Type 2, uncontrolled with questionable improvement  . Dietary noncompliance    History of,  . Dizziness   . EKG, abnormal    History of, with negative cardiac evaluation by history.  . Erectile dysfunction    Multifactorial. Improved with Cialis. New onset.  . H/O folliculitis   . Heartburn   . 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 Hyperlipidemia   . Hypertension    variable in nature  . Indigestion   . Mild obesity   . Multiple excoriations    multifactorial in origin  . Situational stress    secondary to financial issues  . Tobacco abuse     Patient Active Problem List   Diagnosis Date Noted  . Acute deep vein thrombosis (DVT) of distal vein of right lower extremity (HCC) 05/25/2017  . Allergic reaction 08/18/2015  . Hyperkalemia 08/18/2015  . Hyperglycemia 08/10/2014  . Hyponatremia 08/10/2014  . AKI (acute kidney injury) (HCC) 08/10/2014  . Chronic systolic CHF (congestive heart failure) (HCC) 08/10/2014  . Asthma 08/10/2014  . Hand abscess 06/28/2014  . Abscess of left hand 06/27/2014  . CHF, acute on  chronic (HCC) 04/06/2014  . DM type 2 (diabetes mellitus, type 2) (HCC) 04/06/2014  . Scrotal wall abscess 02/15/2014  . Scrotal abscess 10/31/2013  . Alcohol abuse 10/31/2013  . Pulmonary edema 09/22/2013  . Acute on chronic systolic heart failure (HCC) 09/22/2013  . Tobacco abuse 09/22/2013  . HTN (hypertension) 05/19/2012  . Situational stress 05/19/2012  . Uncontrolled diabetes mellitus (HCC) 05/19/2012    Past Surgical History:  Procedure Laterality Date  . I&D EXTREMITY Left 06/28/2014   Procedure: IRRIGATION AND DEBRIDEMENT EXTREMITY;  Surgeon: Kevin Kuzma, MD;  Location: [facility];  Service: Orthopedics;  Laterality: Left;  . INCISION AND DRAINAGE PERIRECTAL ABSCESS N/A 02/17/2014   Procedure: IRRIGATION AND DEBRIDEMENT PERIRECTAL  AND SCROTAL ABSCESS;  Surgeon: Douglas Blackman, MD;  Location: [facility];  Service: General;  Laterality: N/A;  . MINOR IRRIGATION AND DEBRIDEMENT OF WOUND Left 06/20/2015   Procedure: IRRIGATION AND DEBRIDEMENT Wound with nailbed repair;  Surgeon: Kevin Kuzma, MD;  Location: [facility];  Service: Orthopedics;  Laterality: Left;       Home Medications    Prior to Admission medications   Medication Sig Start Date End Date Taking? Authorizing Provider  aspirin EC 81 MG EC tablet Take 1 tablet (81 mg total) by mouth daily. 09/23/13    Yes Emokpae, Ejiroghene E, MD  blood glucose meter kit and supplies Dispense based on patient and insurance preference. Use up to four times daily as directed. (FOR ICD-9 250.00, 250.01). 08/12/14  Yes Velvet Bathe, MD  carvedilol (COREG) 25 MG tablet Take 25 mg by mouth 2 (two) times daily with a meal.   Yes [provider]  [place] 250 MCG tablet Take 0.125 mg by mouth daily. 10/05/17  Yes [provider]  ENTRESTO 49-51 MG TK 1 T PO BID 10/11/17  Yes [provider]  furosemide (LASIX) 40 MG tablet Take 1 tablet (40 mg total) by mouth daily. Patient taking differently: Take 80 mg by mouth  daily.  04/08/14  Yes Thurnell Lose, MD  glimepiride (AMARYL) 4 MG tablet Take 4 mg by mouth daily. 03/18/14  Yes [provider]  insulin detemir (LEVEMIR) 100 UNIT/ML injection Inject 0.4 mLs (40 Units total) into the skin daily. 08/20/15  Yes Norval Gable, MD  Multiple Vitamin (MULTIVITAMIN WITH MINERALS) TABS tablet Take 1 tablet by mouth daily.   Yes [provider]  sildenafil (VIAGRA) 100 MG tablet TK 1 T PO D PRN 10/04/17  Yes [provider]  simvastatin (ZOCOR) 40 MG tablet Take 1 tablet (40 mg total) by mouth daily. 09/23/13  Yes Emokpae, Ejiroghene E, MD  spironolactone (ALDACTONE) 25 MG tablet  11/06/17  Yes [provider]  TRULICITY 4.58 KD/9.8PJ SOPN INJ 0.75 MG [place] ONCE PER WK 10/12/17  Yes [provider]  neomycin-polymyxin-hydrocortisone (CORTISPORIN) 3.5-10000-1 OTIC suspension Place 4 drops into the right ear 4 (four) times daily. 11/07/17   Raylene Everts, MD  sulfamethoxazole-trimethoprim (BACTRIM DS,SEPTRA DS) 800-160 MG tablet Take 1 tablet by mouth 2 (two) times daily for 7 days. 11/07/17 11/14/17  Raylene Everts, MD    Family History Family History  Problem Relation Age of Onset  . Diabetes type II Mother   . Hypertension Mother   . Diabetes Other     Social History Social History   Tobacco Use  . Smoking status: Current Every Day Smoker    Packs/day: 1.00    Years: 20.00    Pack years: 20.00    Types: Cigarettes  . Smokeless tobacco: Never Used  Substance Use Topics  . Alcohol use: Yes    Alcohol/week: 7.2 oz    Types: 10 Cans of beer, 2 Shots of liquor per week    Comment: As of 04/06/14 - states occasional use  . Drug use: No     Allergies   Patient has no known allergies.   Review of Systems Review of Systems  Constitutional: Negative for chills and fever.  HENT: Positive for ear pain and hearing loss. Negative for sore throat.   Eyes: Negative for pain and visual disturbance.    Respiratory: Negative for cough and shortness of breath.   Cardiovascular: Negative for chest pain and palpitations.  Gastrointestinal: Negative for abdominal pain and vomiting.  Genitourinary: Negative for dysuria and hematuria.  Musculoskeletal: Negative for arthralgias and back pain.  Skin: Negative for color change and rash.  Neurological: Negative for seizures and syncope.  All other systems reviewed and are negative.    Physical Exam Triage Vital Signs ED Triage Vitals  Enc Vitals Group     BP 11/07/17 1340 (!) 144/81     Pulse Rate 11/07/17 1340 92     Resp --      Temp 11/07/17 1340 98.7 F (37.1 C)  Temp Source 11/07/17 1340 Temporal     SpO2 11/07/17 1340 96 %     Weight --      Height --      Head Circumference --      Peak Flow --      Pain Score 11/07/17 1337 7     Pain Loc --      Pain Edu? --      Excl. in [place]? --    No data found.  Updated Vital Signs BP (!) 144/81 (BP Location: Left Arm)   Pulse 92   Temp 98.7 F (37.1 C) (Temporal)   SpO2 96%   Visual Acuity Right Eye Distance:   Left Eye Distance:   Bilateral Distance:    Right Eye Near:   Left Eye Near:    Bilateral Near:     Physical Exam  Constitutional: He appears well-developed and well-nourished. No distress.  HENT:  Head: Normocephalic and atraumatic.    Right Ear: Tympanic membrane normal. There is swelling.  Left Ear: Tympanic membrane and external ear normal.  Ears:  Mouth/Throat: Oropharynx is clear and moist. No oropharyngeal exudate.  Eyes: Pupils are equal, round, and reactive to light. Conjunctivae are normal.  Neck: Normal range of motion.  Cardiovascular: Normal rate, regular rhythm and normal heart sounds.  Pulmonary/Chest: Effort normal and breath sounds normal. No respiratory distress.  Abdominal: Soft. He exhibits no distension.  Musculoskeletal: Normal range of motion. He exhibits no edema.  Lymphadenopathy:    He has no cervical adenopathy.   Neurological: He is alert.  Skin: Skin is warm and dry.     UC Treatments / Results  Labs (all labs ordered are listed, but only abnormal results are displayed) Labs Reviewed - No data to display  EKG None  Radiology No results found.  Procedures Incision and Drainage Date/Time: 11/07/2017 2:09 PM Performed by: Amarise Lillo Sue, MD Authorized by: Nalayah Hitt Sue, MD   Consent:    Consent obtained:  Verbal   Consent given by:  Patient   Risks discussed:  Bleeding and incomplete drainage   Alternatives discussed:  No treatment Location:    Type:  Abscess   Location:  Head   Head location:  R external ear Pre-procedure details:    Skin preparation:  Betadine Anesthesia (see MAR for exact dosages):    Anesthesia method:  Local infiltration   Local anesthetic:  Lidocaine 2% WITH epi Procedure type:    Complexity:  Simple Procedure details:    Needle aspiration: no     Incision types:  Stab incision   Incision depth:  Subcutaneous   Scalpel blade:  11   Wound management:  Probed and deloculated   Drainage amount:  Moderate   Wound treatment:  Wound left open Post-procedure details:    Patient tolerance of procedure:  Tolerated well, no immediate complications   (including critical care time)  Medications Ordered in UC Medications - No data to display  Initial Impression / Assessment and Plan / UC Course  I have reviewed the triage vital signs and the nursing notes.  Pertinent labs & imaging results that were available during my care of the patient were reviewed by me and considered in my medical decision making (see chart for details).     Patient has had many abscesses.  He states he knows how to clean and manage.  He is warned to come back for any increased swelling pain or fever. Final Clinical Impressions(s) / UC Diagnoses     Final diagnoses:  Cutaneous abscess of face     Discharge Instructions     Take antibiotic twice a day as  directed. Use eardrops 3-4 times a day in addition Take Tylenol if needed for pain Return for any increased swelling, pain, or fever    ED Prescriptions    Medication Sig Dispense Auth. Provider   sulfamethoxazole-trimethoprim (BACTRIM DS,SEPTRA DS) 800-160 MG tablet Take 1 tablet by mouth 2 (two) times daily for 7 days. 14 tablet Nelson, Yvonne Sue, MD   neomycin-polymyxin-hydrocortisone (CORTISPORIN) 3.5-10000-1 OTIC suspension Place 4 drops into the right ear 4 (four) times daily. 10 mL Nelson, Yvonne Sue, MD     Controlled Substance Prescriptions [place] Controlled Substance Registry consulted? Not Applicable   Nelson, Yvonne Sue, MD 11/07/17 1759  

## 2017-11-07 NOTE — ED Triage Notes (Signed)
Pt reports right ear pain x2-3 days.  He denies any fever.

## 2017-11-07 NOTE — Discharge Instructions (Signed)
Take antibiotic twice a day as directed. Use eardrops 3-4 times a day in addition Take Tylenol if needed for pain Return for any increased swelling, pain, or fever

## 2017-12-05 ENCOUNTER — Encounter: Payer: Self-pay | Admitting: Endocrinology

## 2017-12-11 ENCOUNTER — Ambulatory Visit (HOSPITAL_COMMUNITY)
Admission: EM | Admit: 2017-12-11 | Discharge: 2017-12-11 | Disposition: A | Discharge status: Home / Self Care | Payer: 59 | Attending: Family Medicine | Admitting: Family Medicine

## 2017-12-11 ENCOUNTER — Encounter (HOSPITAL_COMMUNITY): Payer: Self-pay | Admitting: Emergency Medicine

## 2017-12-11 DIAGNOSIS — Z8249 Family history of ischemic heart disease and other diseases of the circulatory system: Secondary | ICD-10-CM | POA: Insufficient documentation

## 2017-12-11 DIAGNOSIS — B9689 Other specified bacterial agents as the cause of diseases classified elsewhere: Secondary | ICD-10-CM | POA: Diagnosis not present

## 2017-12-11 DIAGNOSIS — H6001 Abscess of right external ear: Secondary | ICD-10-CM

## 2017-12-11 DIAGNOSIS — Z794 Long term (current) use of insulin: Secondary | ICD-10-CM | POA: Diagnosis not present

## 2017-12-11 DIAGNOSIS — I5022 Chronic systolic (congestive) heart failure: Secondary | ICD-10-CM | POA: Diagnosis not present

## 2017-12-11 DIAGNOSIS — L0201 Cutaneous abscess of face: Secondary | ICD-10-CM | POA: Diagnosis not present

## 2017-12-11 DIAGNOSIS — J449 Chronic obstructive pulmonary disease, unspecified: Secondary | ICD-10-CM | POA: Insufficient documentation

## 2017-12-11 DIAGNOSIS — Z7982 Long term (current) use of aspirin: Secondary | ICD-10-CM | POA: Insufficient documentation

## 2017-12-11 DIAGNOSIS — Z833 Family history of diabetes mellitus: Secondary | ICD-10-CM | POA: Insufficient documentation

## 2017-12-11 DIAGNOSIS — F17210 Nicotine dependence, cigarettes, uncomplicated: Secondary | ICD-10-CM | POA: Insufficient documentation

## 2017-12-11 DIAGNOSIS — Z79899 Other long term (current) drug therapy: Secondary | ICD-10-CM | POA: Diagnosis not present

## 2017-12-11 DIAGNOSIS — Z86718 Personal history of other venous thrombosis and embolism: Secondary | ICD-10-CM | POA: Diagnosis not present

## 2017-12-11 DIAGNOSIS — I110 Hypertensive heart disease with heart failure: Secondary | ICD-10-CM | POA: Diagnosis not present

## 2017-12-11 DIAGNOSIS — H9201 Otalgia, right ear: Secondary | ICD-10-CM | POA: Diagnosis present

## 2017-12-11 DIAGNOSIS — E119 Type 2 diabetes mellitus without complications: Secondary | ICD-10-CM | POA: Diagnosis not present

## 2017-12-11 DIAGNOSIS — E785 Hyperlipidemia, unspecified: Secondary | ICD-10-CM | POA: Diagnosis not present

## 2017-12-11 DIAGNOSIS — E669 Obesity, unspecified: Secondary | ICD-10-CM | POA: Diagnosis not present

## 2017-12-11 MED ORDER — TRAMADOL HCL 50 MG PO TABS: 50.0000 mg | ORAL_TABLET | Freq: Four times a day (QID) | ORAL | 0 refills | Status: DC | PRN

## 2017-12-11 MED ORDER — LEVOFLOXACIN 750 MG PO TABS: 750.0000 mg | ORAL_TABLET | Freq: Every day | ORAL | 0 refills | Status: DC

## 2017-12-11 NOTE — ED Triage Notes (Signed)
Pt c/o R ear pain for the last few days, pt states he was here a few weeks ago for the same thing.

## 2017-12-11 NOTE — Discharge Instructions (Signed)
Put a warm compress to the side of your face and ear for 20 minutes a couple times a day Press on the swollen area to remove pus daily Take the Levaquin once a day Follow-up with your doctor next week We did lab testing during this visit. (culture)  If there are any abnormal findings that require change in medicine or indicate a positive result, you will be notified.  If all of your tests are normal, you will not be called.

## 2017-12-11 NOTE — ED Provider Notes (Signed)
[facility]    CSN: [id number] Arrival date & time: 12/11/17  1124     History   Chief Complaint Chief Complaint  Patient presents with  . Otalgia    HPI Austin Alexander is a 47 y.o. male.   HPI Patient seen on 11/07/17 for an ear infection.  Action was found to have an abscess on the right side of his face right at the tragus that was impressing on his ear canal.  This was I&D.  There is a lot of purulence.  He was given a course of Septra.  Followed up with his personal physician.  It was still draining.  He was given a course of doxycycline.  He is here today because the abscess is filled up again.  With pressure will drain.  The purulence is malodorous.  He continues to have infection.  No fever.  He states it is painful. Past Medical History:  Diagnosis Date  . 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 Abscess of perineum   . Alcohol abuse   . Asthma    History of,  . Bronchitis   . CHF (congestive heart failure) ([place])   . COPD (chronic obstructive pulmonary disease) (HCC)    moderate obstruction with low vital capacity  . Croup   . Cystic acne    with acne keloidosis  . Diabetes mellitus without complication (HCC)    Type 2, uncontrolled with questionable improvement  . Dietary noncompliance    History of,  . Dizziness   . EKG, abnormal    History of, with negative cardiac evaluation by history.  . Erectile dysfunction    Multifactorial. Improved with Cialis. New onset.  . H/O folliculitis   . Heartburn   . 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 Hyperlipidemia   . Hypertension    variable in nature  . Indigestion   . Mild obesity   . Multiple excoriations    multifactorial in origin  . Situational stress    secondary to financial issues  . Tobacco abuse     Patient Active Problem List   Diagnosis Date Noted  . Acute deep vein thrombosis (DVT) of distal vein of right lower extremity ([place]) 05/25/2017  .  Allergic reaction 08/18/2015  . Hyperkalemia 08/18/2015  . Hyperglycemia 08/10/2014  . Hyponatremia 08/10/2014  . AKI (acute kidney injury) ([place]) 08/10/2014  . Chronic systolic CHF (congestive heart failure) ([place]) 08/10/2014  . Asthma 08/10/2014  . Hand abscess 06/28/2014  . Abscess of left hand 06/27/2014  . CHF, acute on chronic ([place]) 04/06/2014  . DM type 2 (diabetes mellitus, type 2) ([place]) 04/06/2014  . Scrotal wall abscess 02/15/2014  . Scrotal abscess 10/31/2013  . Alcohol abuse 10/31/2013  . Pulmonary edema 09/22/2013  . Acute on chronic systolic heart failure ([place]) 09/22/2013  . Tobacco abuse 09/22/2013  . HTN (hypertension) 05/19/2012  . Situational stress 05/19/2012  . Uncontrolled diabetes mellitus ([place]) 05/19/2012    Past Surgical History:  Procedure Laterality Date  . I&D EXTREMITY Left 06/28/2014   Procedure: IRRIGATION AND DEBRIDEMENT EXTREMITY;  Surgeon: Leanora Cover, MD;  Location: [facility];  Service: Orthopedics;  Laterality: Left;  . INCISION AND DRAINAGE PERIRECTAL ABSCESS N/A 02/17/2014   Procedure: IRRIGATION AND DEBRIDEMENT PERIRECTAL  AND SCROTAL ABSCESS;  Surgeon: Coralie Keens, MD;  Location: [facility];  Service: General;  Laterality: N/A;  . MINOR IRRIGATION AND DEBRIDEMENT OF WOUND Left 06/20/2015   Procedure:  IRRIGATION AND DEBRIDEMENT Wound with nailbed repair;  Surgeon: Leanora Cover, MD;  Location: [facility];  Service: Orthopedics;  Laterality: Left;       Home Medications    Prior to Admission medications   Medication Sig Start Date End Date Taking? Authorizing Provider  aspirin EC 81 MG EC tablet Take 1 tablet (81 mg total) by mouth daily. 09/23/13   Bethena Roys, MD  blood glucose meter kit and supplies Dispense based on patient and insurance preference. Use up to four times daily as directed. (FOR ICD-9 250.00, 250.01). 08/12/14   Velvet Bathe, MD  carvedilol (COREG) 25 MG tablet Take 25 mg by mouth 2 (two) times daily with a  meal.    [provider]  [place] 250 MCG tablet Take 0.125 mg by mouth daily. 10/05/17   [provider]  ENTRESTO 49-51 MG TK 1 T PO BID 10/11/17   [provider]  furosemide (LASIX) 40 MG tablet Take 1 tablet (40 mg total) by mouth daily. Patient taking differently: Take 80 mg by mouth daily.  04/08/14   Thurnell Lose, MD  glimepiride (AMARYL) 4 MG tablet Take 4 mg by mouth daily. 03/18/14   [provider]  insulin detemir (LEVEMIR) 100 UNIT/ML injection Inject 0.4 mLs (40 Units total) into the skin daily. 08/20/15   Norval Gable, MD  levofloxacin (LEVAQUIN) 750 MG tablet Take 1 tablet (750 mg total) by mouth daily. 12/11/17   Raylene Everts, MD  Multiple Vitamin (MULTIVITAMIN WITH MINERALS) TABS tablet Take 1 tablet by mouth daily.    [provider]  sildenafil (VIAGRA) 100 MG tablet TK 1 T PO D PRN 10/04/17   [provider]  simvastatin (ZOCOR) 40 MG tablet Take 1 tablet (40 mg total) by mouth daily. 09/23/13   Bethena Roys, MD  spironolactone (ALDACTONE) 25 MG tablet  11/06/17   [provider]  traMADol (ULTRAM) 50 MG tablet Take 1 tablet (50 mg total) by mouth every 6 (six) hours as needed. 12/11/17   Raylene Everts, MD  TRULICITY 4.81 EH/6.3JS SOPN INJ 0.75 MG [place] ONCE PER WK 10/12/17   [provider]    Family History Family History  Problem Relation Age of Onset  . Diabetes type II Mother   . Hypertension Mother   . Diabetes Other     Social History Social History   Tobacco Use  . Smoking status: Current Every Day Smoker    Packs/day: 1.00    Years: 20.00    Pack years: 20.00    Types: Cigarettes  . Smokeless tobacco: Never Used  Substance Use Topics  . Alcohol use: Yes    Alcohol/week: 12.0 standard drinks    Types: 10 Cans of beer, 2 Shots of liquor per week    Comment: As of 04/06/14 - states occasional use  . Drug use: No     Allergies   Patient has no known  allergies.   Review of Systems Review of Systems  Constitutional: Negative for chills and fever.  HENT: Positive for ear pain. Negative for sore throat.   Eyes: Negative for pain and visual disturbance.  Respiratory: Negative for cough and shortness of breath.   Cardiovascular: Negative for chest pain and palpitations.  Gastrointestinal: Negative for abdominal pain and vomiting.  Genitourinary: Negative for dysuria and hematuria.  Musculoskeletal: Negative for arthralgias and back pain.  Skin: Positive for wound. Negative for color change and rash.  Neurological: Negative  for seizures and syncope.  All other systems reviewed and are negative.    Physical Exam Triage Vital Signs ED Triage Vitals [12/11/17 1146]  Enc Vitals Group     BP 140/89     Pulse Rate 85     Resp 18     Temp 98.1 F (36.7 C)     Temp src      SpO2 100 %     Weight      Height      Head Circumference      Peak Flow      Pain Score      Pain Loc      Pain Edu?      Excl. in [place]?    No data found.  Updated Vital Signs BP 140/89   Pulse 85   Temp 98.1 F (36.7 C)   Resp 18   SpO2 100%   Visual Acuity Right Eye Distance:   Left Eye Distance:   Bilateral Distance:    Right Eye Near:   Left Eye Near:    Bilateral Near:     Physical Exam  Constitutional: He appears well-developed and well-nourished. No distress.  HENT:  Head: Normocephalic and atraumatic.  Ears:  Mouth/Throat: Oropharynx is clear and moist.  Eyes: Pupils are equal, round, and reactive to light. Conjunctivae are normal.  Neck: Normal range of motion.  Cardiovascular: Normal rate.  Pulmonary/Chest: Effort normal. No respiratory distress.  Abdominal: Soft. He exhibits no distension.  Musculoskeletal: Normal range of motion. He exhibits no edema.  Neurological: He is alert.  Skin: Skin is warm and dry.     UC Treatments / Results  Labs (all labs ordered are listed, but only abnormal results are displayed) Labs  Reviewed  AEROBIC CULTURE (SUPERFICIAL SPECIMEN)    EKG None  Radiology No results found.  Procedures Procedures (including critical care time)  Medications Ordered in UC Medications - No data to display  Initial Impression / Assessment and Plan / UC Course  I have reviewed the triage vital signs and the nursing notes.  Pertinent labs & imaging results that were available during my care of the patient were reviewed by me and considered in my medical decision making (see chart for details).      Final Clinical Impressions(s) / UC Diagnoses   Final diagnoses:  Abscess of right external ear     Discharge Instructions     Put a warm compress to the side of your face and ear for 20 minutes a couple times a day Press on the swollen area to remove pus daily Take the Levaquin once a day Follow-up with your doctor next week We did lab testing during this visit. (culture)  If there are any abnormal findings that require change in medicine or indicate a positive result, you will be notified.  If all of your tests are normal, you will not be called.      ED Prescriptions    Medication Sig Dispense Auth. Provider   levofloxacin (LEVAQUIN) 750 MG tablet Take 1 tablet (750 mg total) by mouth daily. 10 tablet Raylene Everts, MD   traMADol (ULTRAM) 50 MG tablet Take 1 tablet (50 mg total) by mouth every 6 (six) hours as needed. 15 tablet Raylene Everts, MD     Controlled Substance Prescriptions [place] Controlled Substance Registry consulted? yes   Raylene Everts, MD 12/11/17 [phone]

## 2017-12-13 LAB — AEROBIC CULTURE  (SUPERFICIAL SPECIMEN): CULTURE: NORMAL

## 2017-12-13 LAB — AEROBIC CULTURE W GRAM STAIN (SUPERFICIAL SPECIMEN)

## 2017-12-28 NOTE — Telephone Encounter (Signed)
This encounter was created in error - please disregard.

## 2017-12-30 DIAGNOSIS — H61001 Unspecified perichondritis of right external ear: Secondary | ICD-10-CM | POA: Insufficient documentation

## 2017-12-30 DIAGNOSIS — H9211 Otorrhea, right ear: Secondary | ICD-10-CM | POA: Insufficient documentation

## 2018-01-02 ENCOUNTER — Encounter (HOSPITAL_BASED_OUTPATIENT_CLINIC_OR_DEPARTMENT_OTHER): Payer: Self-pay

## 2018-01-02 ENCOUNTER — Ambulatory Visit (HOSPITAL_BASED_OUTPATIENT_CLINIC_OR_DEPARTMENT_OTHER): Admit: 2018-01-02 | Payer: 59 | Admitting: Otolaryngology

## 2018-01-02 SURGERY — EXCISION, CYST, EAR
Anesthesia: General | Laterality: Right

## 2018-04-10 ENCOUNTER — Ambulatory Visit: Payer: 59 | Admitting: Endocrinology

## 2018-04-10 ENCOUNTER — Encounter: Payer: Self-pay | Admitting: Endocrinology

## 2018-04-10 VITALS — BP 140/82 | HR 91 | Ht 70.5 in | Wt 287.6 lb

## 2018-04-10 DIAGNOSIS — I10 Essential (primary) hypertension: Secondary | ICD-10-CM

## 2018-04-10 DIAGNOSIS — E1165 Type 2 diabetes mellitus with hyperglycemia: Secondary | ICD-10-CM

## 2018-04-10 DIAGNOSIS — Z794 Long term (current) use of insulin: Secondary | ICD-10-CM | POA: Diagnosis not present

## 2018-04-10 LAB — POCT GLYCOSYLATED HEMOGLOBIN (HGB A1C): Hemoglobin A1C: 9.1 % — AB (ref 4.0–5.6)

## 2018-04-10 LAB — GLUCOSE, POCT (MANUAL RESULT ENTRY): POC GLUCOSE: 139 mg/dL — AB (ref 70–99)

## 2018-04-10 MED ORDER — FREESTYLE LIBRE 14 DAY READER DEVI: 1.0000 | Freq: Once | 0 refills | Status: AC

## 2018-04-10 MED ORDER — FREESTYLE LIBRE 14 DAY SENSOR MISC: 1.0000 [IU] | 4 refills | Status: DC

## 2018-04-10 NOTE — Patient Instructions (Addendum)
Check blood sugars on waking up 5-6 days a week  Also check blood sugars about 2 hours after meals and do this after different meals by rotation  Recommended blood sugar levels on waking up are 90-130 and about 2 hours after meal is 130-180  Please bring your blood sugar monitor to each visit, thank you  Walk 20 min daily  Less juice !

## 2018-04-10 NOTE — Progress Notes (Signed)
Patient ID: Austin Alexander, male   DOB: 30-Oct-1970, 47 y.o.   MRN: [id number]           Reason for Appointment: Consultation for Type 2 Diabetes  Referring physician:   History of Present Illness:          Date of diagnosis of type 2 diabetes mellitus:  2009      Background history:   He is not clear when he was first diagnosed with diabetes Had been previously taking metformin and also for a few years Amaryl and Actos Insulin probably started in 2015 according to records and his A1c has been as high as 15.2 in the past He thinks Trulicity was added about a year ago At some point his A1c apparently has been 7-8% but does not know when  Recent history:   Most recent A1c is 9.1 done on 04/10/2018  INSULIN regimen is: Levemir 30 units a.m.--70 p.m. daily       Non-insulin hypoglycemic drugs the patient is taking are: Glimepiride 4 mg daily, Trulicity 1.5 mg weekly  Current management, blood sugar patterns and problems identified:  He does not have a functioning meter and has not checked his sugar in some time  Although his A1c was over 12% in June with his PCP this may have been from his not taking his insulin consistently even though patient denies this  Currently does not have a specific meal plan to go with although he thinks he is trying to avoid fast food and regular soft drinks  He does not exercise  Although today he has a fairly good blood sugar of 138 in the office he has not had a full meal today  He thinks his fasting readings are usually high  He has been on twice a day insulin for some time and does not think his dose has been changed for several months  No hypoglycemia reported with his basal insulin  Has not been on long-term treatment with mealtime insulin at any point  His cardiologist increase his Trulicity up to 1.5 mg weekly about a month ago          Side effects from medications have been: None  Typical meal intake: Breakfast is  skipped, less  fast food              Exercise: none   Glucose monitoring:  Not done       Glucometer: None.       Blood Glucose readings previously about 200  Dietician visit, most recent: years ago  Weight history:  Wt Readings from Last 3 Encounters:  04/10/18 287 lb 9.6 oz (130.5 kg)  05/25/17 284 lb 9.6 oz (129.1 kg)  05/20/17 282 lb 3.2 oz (128 kg)    Glycemic control:   Lab Results  Component Value Date   HGBA1C 9.1 (A) 04/10/2018   HGBA1C 12.7 09/22/2017   HGBA1C 15.2 (H) 08/18/2015   Lab Results  Component Value Date   LDLCALC 96 09/23/2013   CREATININE 1.5 (A) 09/22/2017   No results found for: MICRALBCREAT  No results found for: FRUCTOSAMINE  Office Visit on 04/10/2018  Component Date Value Ref Range Status  . Glucose 09/22/2017 311   Final  . Creatinine 09/22/2017 1.5* 0.6 - 1.3 Final  . Potassium 09/22/2017 4.8  3.4 - 5.3 Final  . Triglycerides 09/22/2017 549* 40 - 160 Final  . Cholesterol 09/22/2017 153  0 - 200 Final  . HDL 09/22/2017 31* 35 - 70 Final  .  Hemoglobin A1C 09/22/2017 12.7   Final  . Hemoglobin A1C 04/10/2018 9.1* 4.0 - 5.6 % Final  . POC Glucose 04/10/2018 139* 70 - 99 mg/dl Final    Allergies as of 04/10/2018   No Known Allergies     Medication List       Accurate as of April 10, 2018  3:09 PM. Always use your most recent med list.        aspirin 81 MG EC tablet Take 1 tablet (81 mg total) by mouth daily.   blood glucose meter kit and supplies Dispense based on patient and insurance preference. Use up to four times daily as directed. (FOR ICD-9 250.00, 250.01).   carvedilol 25 MG tablet Commonly known as:  COREG Take 25 mg by mouth 2 (two) times daily with a meal.   DIGOX 0.25 MG tablet Generic drug:  digoxin Take 0.125 mg by mouth daily.   ENTRESTO 49-51 MG Generic drug:  sacubitril-valsartan TK 1 T PO BID   FREESTYLE LIBRE 14 DAY READER Devi 1 Device by Does not apply route once for 1 dose.   FREESTYLE LIBRE 14 DAY  SENSOR Misc 1 Units by Does not apply route every 14 (fourteen) days.   furosemide 40 MG tablet Commonly known as:  LASIX Take 1 tablet (40 mg total) by mouth daily.   glimepiride 4 MG tablet Commonly known as:  AMARYL Take 4 mg by mouth daily.   insulin detemir 100 UNIT/ML injection Commonly known as:  LEVEMIR Inject 0.4 mLs (40 Units total) into the skin daily.   levofloxacin 750 MG tablet Commonly known as:  LEVAQUIN Take 1 tablet (750 mg total) by mouth daily.   multivitamin with minerals Tabs tablet Take 1 tablet by mouth daily.   sildenafil 100 MG tablet Commonly known as:  VIAGRA TK 1 T PO D PRN   simvastatin 40 MG tablet Commonly known as:  ZOCOR Take 1 tablet (40 mg total) by mouth daily.   spironolactone 25 MG tablet Commonly known as:  ALDACTONE   traMADol 50 MG tablet Commonly known as:  ULTRAM Take 1 tablet (50 mg total) by mouth every 6 (six) hours as needed.   TRULICITY 5.91 MB/8.4YK Sopn Generic drug:  Dulaglutide Inject 1.25 mg into the skin once a week.       Allergies: No Known Allergies  Past Medical History:  Diagnosis Date  . 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 Abscess of perineum   . Alcohol abuse   . Asthma    History of,  . Bronchitis   . CHF (congestive heart failure) ([place])   . COPD (chronic obstructive pulmonary disease) (H[facility])    moderate obstruction with low vital capacity  . Croup   . Cystic acne    with acne keloidosis  . Diabetes mellitus without complication (H[facility])    Type 2, uncontrolled with questionable improvement  . Dietary noncompliance    History of,  . Dizziness   . EKG, abnormal    History of, with negative cardiac evaluation by history.  . Erectile dysfunction    Multifactorial. Improved with Cialis. New onset.  . H/O folliculitis   . Heartburn   . 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 Hyperlipidemia   . Hypertension    variable in nature  . Indigestion   .  Mild obesity   . Multiple excoriations    multifactorial in origin  . Situational stress  secondary to financial issues  . Tobacco abuse     Past Surgical History:  Procedure Laterality Date  . I&D EXTREMITY Left 06/28/2014   Procedure: IRRIGATION AND DEBRIDEMENT EXTREMITY;  Surgeon: Leanora Cover, MD;  Location: [facility];  Service: Orthopedics;  Laterality: Left;  . INCISION AND DRAINAGE PERIRECTAL ABSCESS N/A 02/17/2014   Procedure: IRRIGATION AND DEBRIDEMENT PERIRECTAL  AND SCROTAL ABSCESS;  Surgeon: Coralie Keens, MD;  Location: [facility];  Service: General;  Laterality: N/A;  . MINOR IRRIGATION AND DEBRIDEMENT OF WOUND Left 06/20/2015   Procedure: IRRIGATION AND DEBRIDEMENT Wound with nailbed repair;  Surgeon: Leanora Cover, MD;  Location: [facility];  Service: Orthopedics;  Laterality: Left;    Family History  Problem Relation Age of Onset  . Diabetes type II Mother   . Hypertension Mother   . Diabetes Other     Social History:  reports that he has been smoking cigarettes. He has a 20.00 pack-year smoking history. He has never used smokeless tobacco. He reports current alcohol use of about 12.0 standard drinks of alcohol per week. He reports that he does not use drugs.   Review of Systems  Constitutional:       Has gained about 5 pounds in 2019  HENT: Negative for headaches.   Eyes: Negative for blurred vision.  Respiratory: Negative for shortness of breath.   Cardiovascular: Positive for leg swelling. Negative for chest pain.       Tends to have swelling of the right leg  Gastrointestinal: Negative for vomiting and constipation.  Endocrine: Positive for erectile dysfunction. Negative for fatigue and polydipsia.  Genitourinary: Positive for nocturia.  Musculoskeletal: Negative for joint pain.  Skin:       Has had toenail fungus, does not see a podiatrist  Neurological: Negative for numbness and tingling.     Lipid history: Treated by PCP with simvastatin      Lab Results  Component Value Date   CHOL 153 09/22/2017   HDL 31 (A) 09/22/2017   LDLCALC 96 09/23/2013   TRIG 549 (A) 09/22/2017   CHOLHDL 3.5 09/23/2013           Hypertension: Has been present  BP Readings from Last 3 Encounters:  04/10/18 140/82  12/11/17 140/89  11/07/17 (!) 144/81    Most recent eye exam was a few years ago  Most recent foot exam: 12/19  Currently known complications of diabetes:  LABS:  Office Visit on 04/10/2018  Component Date Value Ref Range Status  . Glucose 09/22/2017 311   Final  . Creatinine 09/22/2017 1.5* 0.6 - 1.3 Final  . Potassium 09/22/2017 4.8  3.4 - 5.3 Final  . Triglycerides 09/22/2017 549* 40 - 160 Final  . Cholesterol 09/22/2017 153  0 - 200 Final  . HDL 09/22/2017 31* 35 - 70 Final  . Hemoglobin A1C 09/22/2017 12.7   Final  . Hemoglobin A1C 04/10/2018 9.1* 4.0 - 5.6 % Final  . POC Glucose 04/10/2018 139* 70 - 99 mg/dl Final    Physical Examination:  BP 140/82 (BP Location: Right Arm, Patient Position: Sitting, Cuff Size: Large)   Pulse 91   Ht 5' 10.5" (1.791 m)   Wt 287 lb 9.6 oz (130.5 kg)   SpO2 98%   BMI 40.68 kg/m   GENERAL:         Patient has generalized obesity.    HEENT:         Eye exam shows normal external appearance.  Fundus exam shows  no retinopathy.  Oral exam shows normal mucosa .   NECK:   There is no lymphadenopathy  Thyroid is not enlarged and no nodules felt.   Carotids are normal to palpation and no bruit heard  LUNGS:         Chest is symmetrical. Lungs are clear to auscultation.[name]   HEART:         Heart sounds:  S1 and S2 are normal. No murmur or click heard., no S3 or S4.   ABDOMEN:   There is no distention present. Liver and spleen are not palpable.  No other mass or tenderness present.    NEUROLOGICAL:   Ankle jerks are absent bilaterally.    Diabetic Foot Exam - Simple   Simple Foot Form Diabetic Foot exam was performed with the following findings:  Yes 04/10/2018  2:12 PM   Visual Inspection No deformities, no ulcerations, no other skin breakdown bilaterally:  Yes See comments:  Yes Sensation Testing Intact to touch and monofilament testing bilaterally:  Yes See comments:  Yes Pulse Check Posterior Tibialis and Dorsalis pulse intact bilaterally:  Yes Comments Severe onychomycosis of first and second toes of toes right and mild on the left first toe. Mildly decreased monofilament sensation on the distal second right toe and distal plantar surfaces of left foot            Vibration sense is mildly reduced in distal first toes on the left and moderately on the right.  MUSCULOSKELETAL:  There is no swelling or deformity of the peripheral joints.     EXTREMITIES:  Has swelling with some pitting of the right leg in the lower part, about 2+, none on the left  SKIN:       No rash, see foot exam    ASSESSMENT:  Diabetes type 2  See history of present illness for detailed discussion of current diabetes management, blood sugar patterns and problems identified  Recent A1c indicates poor control although his A1c was over 12% this summer  Current treatment regimen is only basal insulin and Trulicity With his not monitoring his blood sugar it is unclear what his blood sugar patterns are and today his blood sugar is not unusually high Has had minimal diabetes education and does not have a meal plan to follow Is significantly obese and unable to lose weight He is not motivated to exercise  Complications of diabetes: May have minimal diabetes related neuropathy, has had erectile dysfunction  Other medical problems include hypertension, hyperlipidemia with uncontrolled triglycerides, history of cardiomyopathy and right leg edema  PLAN:    1. Glucose monitoring: He is interested in doing the freestyle libre sensor and will prescribe this . Patient advised to check readings either fasting or 2 hours after meals and if able to do it with the sensor he can check  his sugar at least 3-4 times a day  2.  Diabetes education: . Patient will need meal planning for weight loss and optimal blood sugar control and referral made to the dietitian  3.  Lifestyle changes: . Dietary changes: Cut back on juice.  Needs to review meal planning with dietitian . Exercise regimen: He needs to start walking at least 15 to 20 minutes daily, he can do that during the day  4.  Medication changes needed: . None as yet, consider adjusting insulin based on his blood sugar patterns on the next visit . Also may consider mealtime insulin if needed to cover hyperglycemia after dinner  5.  Preventive care needed:  . Urine microalbumin, regular eye exams  6.  Follow-up: 6 weeks    Patient Instructions  Check blood sugars on waking up 5-6 days a week  Also check blood sugars about 2 hours after meals and do this after different meals by rotation  Recommended blood sugar levels on waking up are 90-130 and about 2 hours after meal is 130-180  Please bring your blood sugar monitor to each visit, thank you  Walk 20 min daily  Less juice !        Consultation note has been sent to the referring physician  Elayne Snare 04/10/2018, 3:09 PM   Note: This office note was prepared with Dragon voice recognition system technology. Any transcriptional errors that result from this process are unintentional.

## 2018-05-01 ENCOUNTER — Encounter: Payer: 59 | Admitting: Dietician

## 2018-05-22 ENCOUNTER — Ambulatory Visit: Payer: Commercial Managed Care - HMO | Admitting: Podiatry

## 2018-05-22 ENCOUNTER — Encounter: Payer: Self-pay | Admitting: Podiatry

## 2018-05-22 ENCOUNTER — Other Ambulatory Visit (INDEPENDENT_AMBULATORY_CARE_PROVIDER_SITE_OTHER): Payer: 59

## 2018-05-22 DIAGNOSIS — B351 Tinea unguium: Secondary | ICD-10-CM

## 2018-05-22 DIAGNOSIS — L603 Nail dystrophy: Secondary | ICD-10-CM | POA: Diagnosis not present

## 2018-05-22 DIAGNOSIS — Z794 Long term (current) use of insulin: Secondary | ICD-10-CM

## 2018-05-22 DIAGNOSIS — E0842 Diabetes mellitus due to underlying condition with diabetic polyneuropathy: Secondary | ICD-10-CM | POA: Diagnosis not present

## 2018-05-22 DIAGNOSIS — M79609 Pain in unspecified limb: Secondary | ICD-10-CM | POA: Diagnosis not present

## 2018-05-22 DIAGNOSIS — E1165 Type 2 diabetes mellitus with hyperglycemia: Secondary | ICD-10-CM

## 2018-05-22 LAB — URINALYSIS, ROUTINE W REFLEX MICROSCOPIC
Bilirubin Urine: NEGATIVE
Hgb urine dipstick: NEGATIVE
KETONES UR: NEGATIVE
LEUKOCYTES UA: NEGATIVE
NITRITE: NEGATIVE
SPECIFIC GRAVITY, URINE: 1.02 (ref 1.000–1.030)
TOTAL PROTEIN, URINE-UPE24: 100 — AB
URINE GLUCOSE: NEGATIVE
UROBILINOGEN UA: 0.2 (ref 0.0–1.0)
pH: 5 (ref 5.0–8.0)

## 2018-05-22 LAB — COMPREHENSIVE METABOLIC PANEL
ALBUMIN: 3.6 g/dL (ref 3.5–5.2)
ALT: 16 U/L (ref 0–53)
AST: 16 U/L (ref 0–37)
Alkaline Phosphatase: 142 U/L — ABNORMAL HIGH (ref 39–117)
BUN: 26 mg/dL — AB (ref 6–23)
CALCIUM: 8.9 mg/dL (ref 8.4–10.5)
CO2: 28 mEq/L (ref 19–32)
Chloride: 89 mEq/L — ABNORMAL LOW (ref 96–112)
Creatinine, Ser: 1.79 mg/dL — ABNORMAL HIGH (ref 0.40–1.50)
GFR: 48.92 mL/min — AB (ref >60.00)
Glucose, Bld: 121 mg/dL — ABNORMAL HIGH (ref 70–99)
POTASSIUM: 5.8 meq/L — AB (ref 3.5–5.1)
SODIUM: 125 meq/L — AB (ref 135–145)
Total Bilirubin: 0.5 mg/dL (ref 0.2–1.2)
Total Protein: 7.7 g/dL (ref 6.0–8.3)

## 2018-05-22 LAB — MICROALBUMIN / CREATININE URINE RATIO
CREATININE, U: 170.5 mg/dL
MICROALB UR: 95.9 mg/dL — AB (ref 0.0–1.9)
MICROALB/CREAT RATIO: 56.2 mg/g — AB (ref 0.0–30.0)

## 2018-05-23 LAB — FRUCTOSAMINE: Fructosamine: 265 umol/L (ref 0–285)

## 2018-05-26 NOTE — Progress Notes (Signed)
Subjective: 48 year old male with PMHx of DM presenting today with a chief complaint of a partially detached right second toenail that became symptomatic a few months ago. He states the nail has been hanging for the past two weeks. He has not done anything for treatment. There are no modifying factors noted.  He also complains of elongated, thickened nails 1-5 of bilateral feet that are painful with ambulation in shoes. He is unable to trim his own nails. Patient is here for further evaluation and treatment.   Past Medical History:  Diagnosis Date  . 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 Abscess of perineum   . Alcohol abuse   . Asthma    History of,  . Bronchitis   . CHF (congestive heart failure) ([place])   . COPD (chronic obstructive pulmonary disease) (H[facility])    moderate obstruction with low vital capacity  . Croup   . Cystic acne    with acne keloidosis  . Diabetes mellitus without complication ([facility])    Type 2, uncontrolled with questionable improvement  . Dietary noncompliance    History of,  . Dizziness   . EKG, abnormal    History of, with negative cardiac evaluation by history.  . Erectile dysfunction    Multifactorial. Improved with Cialis. New onset.  . H/O folliculitis   . Heartburn   . 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 Hyperlipidemia   . Hypertension    variable in nature  . Indigestion   . Mild obesity   . Multiple excoriations    multifactorial in origin  . Situational stress    secondary to financial issues  . Tobacco abuse     Objective:  General: Well developed, nourished, in no acute distress, alert and oriented x3   Dermatology: Hyperkeratotic, discolored, thickened, onychodystrophy of the right 2nd toenail. Nails are tender, long, thickened and dystrophic with subungual debris, consistent with onychomycosis, 1-5 bilateral. No signs of infection noted. Skin is warm, dry and supple bilateral lower extremities.  Negative for open lesions or macerations.  Vascular: Dorsalis Pedis artery and Posterior Tibial artery pedal pulses palpable. No lower extremity edema noted.   Neruologic: Grossly intact via light touch bilateral.  Musculoskeletal: Muscular strength within normal limits in all groups bilateral. Normal range of motion noted to all pedal and ankle joints.   Assessment:  #1 Dystrophic nail of the right 2nd toe #2 Diabetes Mellitus w/ peripheral neuropathy #3 Onychomycosis of nail due to dermatophyte bilateral   Plan of Care:  1. Patient evaluated.  2. Discussed treatment alternatives and plan of care. Explained nail avulsion procedure and post procedure course to patient. 3. Patient opted for total temporary nail avulsion of the right 2nd toe.  4. No anesthesia utilized secondary to patient being neuropathic.  5. Light dressing applied. 6. Mechanical debridement of nails 1-5 left, 1, 3-5 right performed using a nail nipper. Filed with dremel without incident.  7. Recommended Betadine daily with a dry sterile dressing to right 2nd toe.  8. Return to clinic in 3 months.   Edrick Kins, DPM [facility]  Dr. Edrick Kins, [place]                                        [place], [place] [address]  Office [phone]  Fax [phone]

## 2018-05-29 ENCOUNTER — Ambulatory Visit: Payer: 59 | Admitting: Endocrinology

## 2018-05-30 ENCOUNTER — Encounter: Payer: Self-pay | Admitting: Endocrinology

## 2018-05-30 ENCOUNTER — Ambulatory Visit: Payer: 59 | Admitting: Endocrinology

## 2018-05-30 ENCOUNTER — Other Ambulatory Visit: Payer: Self-pay

## 2018-05-30 VITALS — BP 102/64 | HR 100 | Ht 70.5 in | Wt 286.0 lb

## 2018-05-30 DIAGNOSIS — E875 Hyperkalemia: Secondary | ICD-10-CM

## 2018-05-30 DIAGNOSIS — E871 Hypo-osmolality and hyponatremia: Secondary | ICD-10-CM

## 2018-05-30 DIAGNOSIS — E1165 Type 2 diabetes mellitus with hyperglycemia: Secondary | ICD-10-CM | POA: Diagnosis not present

## 2018-05-30 DIAGNOSIS — Z794 Long term (current) use of insulin: Secondary | ICD-10-CM

## 2018-05-30 DIAGNOSIS — I952 Hypotension due to drugs: Secondary | ICD-10-CM

## 2018-05-30 LAB — GLUCOSE, POCT (MANUAL RESULT ENTRY): POC GLUCOSE: 243 mg/dL — AB (ref 70–99)

## 2018-05-30 MED ORDER — GLUCOSE BLOOD VI STRP: ORAL_STRIP | 3 refills | Status: DC

## 2018-05-30 MED ORDER — ACCU-CHEK FASTCLIX LANCETS MISC: 3 refills | Status: DC

## 2018-05-30 NOTE — Progress Notes (Signed)
Patient ID: Austin Alexander, male   DOB: [birth year], 48 y.o.   MRN: [id number]           Reason for Appointment: for Type 2 Diabetes  Referring physician:   History of Present Illness:          Date of diagnosis of type 2 diabetes mellitus:  2009      Background history:   He is not clear when he was first diagnosed with diabetes Had been previously taking metformin and also for a few years Amaryl and Actos Insulin probably started in 2015 according to records and his A1c has been as high as 15.2 in the past He thinks Trulicity was added about a year ago At some point his A1c apparently has been 7-8% but does not know when  Recent history:   Most recent A1c is 9.1 done on 04/10/2018 FRUCTOSAMINE is 265  INSULIN regimen is: Levemir 30 units a.m.--70 p.m. daily       Non-insulin hypoglycemic drugs the patient is taking are: Glimepiride 4 mg daily, Trulicity 1.5 mg weekly  Current management, blood sugar patterns and problems identified:  He does not check his blood sugars.  He was given the freestyle libre but he thinks this was too expensive and he did not let us know if he needed an alternative meter  Also he was supposed to see the dietitian but he has not made an appointment  He had a blood sugar of 121 nonfasting with his labs with a light meal but today after he ate high-fat and high sugar lunch his blood sugar is 243  Since he did not have any blood sugar patterns his treatment regimen was not changed on his initial consultation  It is difficult to know if he has consistent trolled because of lack of monitoring  He still is not motivated to get back on high fat foods  Also despite reminders has not exercise  Does not think he has had any symptoms of hypoglycemia recently, he has had one remote episode and is aware of the symptoms        Side effects from medications have been: None  Typical meal intake: Breakfast is  skipped, less fast food               Exercise: none   Glucose monitoring:  Not done       Glucometer: None.       Blood Glucose readings previously about 200  Dietician visit, most recent: years ago  Weight history:  Wt Readings from Last 3 Encounters:  05/30/18 286 lb (129.7 kg)  04/10/18 287 lb 9.6 oz (130.5 kg)  05/25/17 284 lb 9.6 oz (129.1 kg)    Glycemic control:   Lab Results  Component Value Date   HGBA1C 9.1 (A) 04/10/2018   HGBA1C 12.7 09/22/2017   HGBA1C 15.2 (H) 08/18/2015   Lab Results  Component Value Date   MICROALBUR 95.9 (H) 05/22/2018   LDLCALC 96 09/23/2013   CREATININE 1.79 (H) 05/22/2018   Lab Results  Component Value Date   MICRALBCREAT 56.2 (H) 05/22/2018    Lab Results  Component Value Date   FRUCTOSAMINE 265 05/22/2018    Office Visit on 05/30/2018  Component Date Value Ref Range Status  . POC Glucose 05/30/2018 243* 70 - 99 mg/dl Final    Allergies as of 05/30/2018   No Known Allergies     Medication List       Accurate as of May 30, 2018  1:27 PM. Always use your most recent med list.        aspirin 81 MG EC tablet Take 1 tablet (81 mg total) by mouth daily.   blood glucose meter kit and supplies Dispense based on patient and insurance preference. Use up to four times daily as directed. (FOR ICD-9 250.00, 250.01).   carvedilol 25 MG tablet Commonly known as:  COREG Take 25 mg by mouth 2 (two) times daily with a meal.   DIGOX 0.25 MG tablet Generic drug:  digoxin Take 0.125 mg by mouth daily.   ENTRESTO 49-51 MG Generic drug:  sacubitril-valsartan TK 1 T PO BID   furosemide 40 MG tablet Commonly known as:  LASIX Take 1 tablet (40 mg total) by mouth daily.   glimepiride 4 MG tablet Commonly known as:  AMARYL Take 4 mg by mouth 2 (two) times daily. Take 1 tablet by mouth twice daily.   insulin detemir 100 UNIT/ML injection Commonly known as:  LEVEMIR Inject 0.4 mLs (40 Units total) into the skin daily.   multivitamin with minerals Tabs  tablet Take 1 tablet by mouth daily.   sildenafil 100 MG tablet Commonly known as:  VIAGRA TK 1 T PO D PRN   simvastatin 40 MG tablet Commonly known as:  ZOCOR Take 1 tablet (40 mg total) by mouth daily.   TRULICITY 8.93 YB/0.1BP Sopn Generic drug:  Dulaglutide Inject 1.25 mg into the skin once a week.       Allergies: No Known Allergies  Past Medical History:  Diagnosis Date  . 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 Abscess of perineum   . Alcohol abuse   . Asthma    History of,  . Bronchitis   . CHF (congestive heart failure) ([facility])   . COPD (chronic obstructive pulmonary disease) ([facility])    moderate obstruction with low vital capacity  . Croup   . Cystic acne    with acne keloidosis  . Diabetes mellitus without complication ([facility])    Type 2, uncontrolled with questionable improvement  . Dietary noncompliance    History of,  . Dizziness   . EKG, abnormal    History of, with negative cardiac evaluation by history.  . Erectile dysfunction    Multifactorial. Improved with Cialis. New onset.  . H/O folliculitis   . Heartburn   . 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 Hyperlipidemia   . Hypertension    variable in nature  . Indigestion   . Mild obesity   . Multiple excoriations    multifactorial in origin  . Situational stress    secondary to financial issues  . Tobacco abuse     Past Surgical History:  Procedure Laterality Date  . I&D EXTREMITY Left 06/28/2014   Procedure: IRRIGATION AND DEBRIDEMENT EXTREMITY;  Surgeon: Leanora Cover, MD;  Location: [facility];  Service: Orthopedics;  Laterality: Left;  . INCISION AND DRAINAGE PERIRECTAL ABSCESS N/A 02/17/2014   Procedure: IRRIGATION AND DEBRIDEMENT PERIRECTAL  AND SCROTAL ABSCESS;  Surgeon: Coralie Keens, MD;  Location: [facility];  Service: General;  Laterality: N/A;  . MINOR IRRIGATION AND DEBRIDEMENT OF WOUND Left 06/20/2015   Procedure: IRRIGATION AND DEBRIDEMENT Wound with  nailbed repair;  Surgeon: Leanora Cover, MD;  Location: [facility];  Service: Orthopedics;  Laterality: Left;    Family History  Problem Relation Age of Onset  . Diabetes type II Mother   . Hypertension Mother   .  Diabetes Other     Social History:  reports that he has been smoking cigarettes. He has a 20.00 pack-year smoking history. He has never used smokeless tobacco. He reports current alcohol use of about 12.0 standard drinks of alcohol per week. He reports that he does not use drugs.   Review of Systems   Lipid history: Treated by PCP with simvastatin, has mixed hyperlipidemia    Lab Results  Component Value Date   CHOL 153 09/22/2017   HDL 31 (A) 09/22/2017   LDLCALC 96 09/23/2013   TRIG 549 (A) 09/22/2017   CHOLHDL 3.5 09/23/2013           Hypertension: Has been present and is on carvedilol and Entresto Followed by cardiologist  BP Readings from Last 3 Encounters:  05/30/18 128/70  04/10/18 140/82  12/11/17 140/89    Most recent eye exam was a few years ago  Most recent foot exam: 12/19  Currently known complications of diabetes: Erectile dysfunction, minimal neuropathy, microalbuminuria  CKD: He appears to have persistently abnormal renal function which is slightly worse  Lab Results  Component Value Date   CREATININE 1.79 (H) 05/22/2018   CREATININE 1.5 (A) 09/22/2017   CREATININE 1.65 (H) 05/20/2017   HYPERKALEMIA: Despite stopping potassium supplements his potassium is still significantly high He is not aware of any low potassium diet  Lab Results  Component Value Date   K 5.8 (H) 05/22/2018     LABS:  Office Visit on 05/30/2018  Component Date Value Ref Range Status  . POC Glucose 05/30/2018 243* 70 - 99 mg/dl Final    Physical Examination:  BP 128/70 (BP Location: Left Arm, Patient Position: Sitting, Cuff Size: Normal)   Pulse 100   Ht 5' 10.5" (1.791 m)   Wt 286 lb (129.7 kg)   SpO2 96%   BMI 40.46 kg/m         ASSESSMENT:  Diabetes type 2  See history of present illness for detailed discussion of current diabetes management, blood sugar patterns and problems identified  Last A1c indicates poor control although his fructosamine is only upper normal now  His blood sugars appear to be high only when he is going off his diet  With his not monitoring his blood sugar at home it is unclear what his blood sugar patterns are Also no hypoglycemia despite taking 100 units of insulin  Hypertension with mild orthostatic hypotension He has significant hyponatremia, asymptomatic and may be related to increased fluid intake along with renal dysfunction 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 Glucose monitoring: He was given an Accu-Chek meter and he will start using this as his insurance does not cover the freestyle libre  Patient advised to check readings either fasting or 2 hours after meals  2.  Diabetes education: . Patient will need meal planning for weight loss and more consistent limitation of low-fat low carbohydrate diet, referral done again  3.  Lifestyle changes: . Dietary changes: As above, discussed making better meal choices . Exercise regimen: He needs to start walking at least 15 to 20 minutes daily, he can do that during the day  4.  Medication changes needed: . Will need to wait till his blood sugar monitoring is implemented and his diet is improved before changing his medication . May need reduction  of insulin if blood sugars are below 100 fasting  . 5.  HYPERKALEMIA: He will follow-up with his PCP on Friday . In the meantime given information on low potassium diet . For his renal dysfunction and low normal blood pressure he will reduce his Lasix and Entresto to half dosage, this may also  help hyperkalemia . He needs to discuss need for Entresto with his PCP and cardiologist . NEPHROLOGY consultation would be beneficial . HYPONATREMIA: We will cut back his fluid intake 50% and with improved renal function may restore the balance but he needs to follow-up with his PCP on Friday for repeat testing  6.  Follow-up: 4 weeks  Total visit time for evaluation and management of multiple problems and counseling =25 minutes  There are no Patient Instructions on file for this visit.     Elayne Snare 05/30/2018, 1:27 PM   Note: This office note was prepared with Dragon voice recognition system technology. Any transcriptional errors that result from this process are unintentional.

## 2018-05-30 NOTE — Patient Instructions (Signed)
Entresto reduce to 1x daily  Cut back of water 50%  Cut Lasix to 1 daily  Check blood sugars on waking up 3 days a week  Also check blood sugars about 2 hours after meals and do this after different meals by rotation  Recommended blood sugar levels on waking up are 90-130 and about 2 hours after meal is 130-160  Please bring your blood sugar monitor to each visit, thank you

## 2018-06-21 ENCOUNTER — Encounter: Payer: 59 | Admitting: Podiatry

## 2018-06-26 ENCOUNTER — Ambulatory Visit: Payer: 59 | Admitting: Dietician

## 2018-06-27 NOTE — Progress Notes (Signed)
This encounter was created in error - please disregard.

## 2018-07-14 ENCOUNTER — Other Ambulatory Visit: Payer: Self-pay

## 2018-07-14 ENCOUNTER — Other Ambulatory Visit (INDEPENDENT_AMBULATORY_CARE_PROVIDER_SITE_OTHER): Payer: 59

## 2018-07-14 DIAGNOSIS — E1165 Type 2 diabetes mellitus with hyperglycemia: Secondary | ICD-10-CM | POA: Diagnosis not present

## 2018-07-14 DIAGNOSIS — Z794 Long term (current) use of insulin: Secondary | ICD-10-CM | POA: Diagnosis not present

## 2018-07-14 LAB — COMPREHENSIVE METABOLIC PANEL
ALT: 14 U/L (ref 0–53)
AST: 14 U/L (ref 0–37)
Albumin: 3.6 g/dL (ref 3.5–5.2)
Alkaline Phosphatase: 114 U/L (ref 39–117)
BUN: 18 mg/dL (ref 6–23)
CO2: 28 mEq/L (ref 19–32)
Calcium: 9.2 mg/dL (ref 8.4–10.5)
Chloride: 94 mEq/L — ABNORMAL LOW (ref 96–112)
Creatinine, Ser: 1.63 mg/dL — ABNORMAL HIGH (ref 0.40–1.50)
GFR: 54.47 mL/min — ABNORMAL LOW (ref >60.00)
Glucose, Bld: 143 mg/dL — ABNORMAL HIGH (ref 70–99)
Potassium: 5.1 mEq/L (ref 3.5–5.1)
Sodium: 129 mEq/L — ABNORMAL LOW (ref 135–145)
Total Bilirubin: 0.4 mg/dL (ref 0.2–1.2)
Total Protein: 8 g/dL (ref 6.0–8.3)

## 2018-07-14 LAB — LIPID PANEL
Cholesterol: 134 mg/dL (ref 0–200)
HDL: 26.5 mg/dL — ABNORMAL LOW (ref >39.00)
NonHDL: 107.28
Total CHOL/HDL Ratio: 5
Triglycerides: 257 mg/dL — ABNORMAL HIGH (ref 0.0–149.0)
VLDL: 51.4 mg/dL — ABNORMAL HIGH (ref 0.0–40.0)

## 2018-07-14 LAB — HEMOGLOBIN A1C: Hgb A1c MFr Bld: 8.3 % — ABNORMAL HIGH (ref 4.6–6.5)

## 2018-07-14 LAB — LDL CHOLESTEROL, DIRECT: Direct LDL: 57 mg/dL

## 2018-07-18 ENCOUNTER — Encounter: Payer: Self-pay | Admitting: Endocrinology

## 2018-07-18 ENCOUNTER — Other Ambulatory Visit: Payer: Self-pay

## 2018-07-18 ENCOUNTER — Ambulatory Visit (INDEPENDENT_AMBULATORY_CARE_PROVIDER_SITE_OTHER): Payer: 59 | Admitting: Endocrinology

## 2018-07-18 ENCOUNTER — Ambulatory Visit: Payer: 59 | Admitting: Endocrinology

## 2018-07-18 DIAGNOSIS — Z794 Long term (current) use of insulin: Secondary | ICD-10-CM | POA: Diagnosis not present

## 2018-07-18 DIAGNOSIS — E871 Hypo-osmolality and hyponatremia: Secondary | ICD-10-CM

## 2018-07-18 DIAGNOSIS — N289 Disorder of kidney and ureter, unspecified: Secondary | ICD-10-CM

## 2018-07-18 DIAGNOSIS — E1165 Type 2 diabetes mellitus with hyperglycemia: Secondary | ICD-10-CM

## 2018-07-18 NOTE — Progress Notes (Signed)
Patient ID: Austin Alexander, male   DOB: 20-Jan-1971, 48 y.o.   MRN: [id number]           Reason for Appointment: Follow-up for Type 2 Diabetes    Today's office visit was provided via telemedicine using video technique . Consent for the patient has been obtained . Location of the patient: Home . Location of the provider: Office Only the patient and myself were participating in the encounter  The patient chart, labs, previous correspondence and snapshot reviewed prior to patient call   History of Present Illness:          Date of diagnosis of type 2 diabetes mellitus:  2009      Background history:   He is not clear when he was first diagnosed with diabetes Had been previously taking metformin and also for a few years Amaryl and Actos Insulin probably started in 2015 according to records and his A1c has been as high as 15.2 in the past He thinks Trulicity was added about a year ago At some point his A1c apparently has been 7-8% but does not know when  Recent history:   His A1c is slightly better at 8.3 compared to 9.1  INSULIN regimen is: Levemir 30 units a.m.--70 p.m. daily       Non-insulin hypoglycemic drugs the patient is taking are: Glimepiride 4 mg daily, Trulicity 1.5 mg weekly  Current management, blood sugar patterns and problems identified:  He does not check his blood sugars regularly again and he thinks he checks it only when he does not feel well  He is now using the Accu-Chek meter and he still has the strips  He has checked blood sugars only occasionally about 3 times since his last visit with the last reading about 160  Fasting lab glucose was 143  Currently he is having issues with occasionally missing his morning insulin doses because of leaving early for work  .  He was repeatedly asked to set up an appointment with the dietitian but has not done so  Also not motivated to exercise  He has been taking his Trulicity regularly every Tuesday Does not  think he has had any symptoms of hypoglycemia such as weakness and shaking        Side effects from medications have been: None  Typical meal intake: Breakfast is  skipped, less fast food  recently             Exercise: none   Glucose monitoring:  Not done       Glucometer: None.       Blood Glucose readings previously about 200  Dietician visit, most recent: years ago  Weight history:  Wt Readings from Last 3 Encounters:  05/30/18 286 lb (129.7 kg)  04/10/18 287 lb 9.6 oz (130.5 kg)  05/25/17 284 lb 9.6 oz (129.1 kg)    Glycemic control:   Lab Results  Component Value Date   HGBA1C 8.3 (H) 07/14/2018   HGBA1C 9.1 (A) 04/10/2018   HGBA1C 12.7 09/22/2017   Lab Results  Component Value Date   MICROALBUR 95.9 (H) 05/22/2018   LDLCALC 96 09/23/2013   CREATININE 1.63 (H) 07/14/2018   Lab Results  Component Value Date   MICRALBCREAT 56.2 (H) 05/22/2018    Lab Results  Component Value Date   FRUCTOSAMINE 265 05/22/2018    Lab on 07/14/2018  Component Date Value Ref Range Status  . Cholesterol 07/14/2018 134  0 - 200 mg/dL Final   ATP  III Classification       Desirable:  < 200 mg/dL               Borderline High:  200 - 239 mg/dL          High:  > = 240 mg/dL  . Triglycerides 07/14/2018 257.0* 0.0 - 149.0 mg/dL Final   Normal:  <150 mg/dLBorderline High:  150 - 199 mg/dL  . HDL 07/14/2018 26.50* >39.00 mg/dL Final  . VLDL 07/14/2018 51.4* 0.0 - 40.0 mg/dL Final  . Total CHOL/HDL Ratio 07/14/2018 5   Final                  Men          Women1/2 Average Risk     3.4          3.3Average Risk          5.0          4.42X Average Risk          9.6          7.13X Average Risk          15.0          11.0                      . NonHDL 07/14/2018 107.28   Final   NOTE:  Non-HDL goal should be 30 mg/dL higher than patient's LDL goal (i.e. LDL goal of < 70 mg/dL, would have non-HDL goal of < 100 mg/dL)  . Sodium 07/14/2018 129* 135 - 145 mEq/L Final  . Potassium 07/14/2018 5.1   3.5 - 5.1 mEq/L Final  . Chloride 07/14/2018 94* 96 - 112 mEq/L Final  . CO2 07/14/2018 28  19 - 32 mEq/L Final  . Glucose, Bld 07/14/2018 143* 70 - 99 mg/dL Final  . BUN 07/14/2018 18  6 - 23 mg/dL Final  . Creatinine, Ser 07/14/2018 1.63* 0.40 - 1.50 mg/dL Final  . Total Bilirubin 07/14/2018 0.4  0.2 - 1.2 mg/dL Final  . Alkaline Phosphatase 07/14/2018 114  39 - 117 U/L Final  . AST 07/14/2018 14  0 - 37 U/L Final  . ALT 07/14/2018 14  0 - 53 U/L Final  . Total Protein 07/14/2018 8.0  6.0 - 8.3 g/dL Final  . Albumin 07/14/2018 3.6  3.5 - 5.2 g/dL Final  . Calcium 07/14/2018 9.2  8.4 - 10.5 mg/dL Final  . GFR 07/14/2018 54.47* >60.00 mL/min Final  . Hgb A1c MFr Bld 07/14/2018 8.3* 4.6 - 6.5 % Final   Glycemic Control Guidelines for People with Diabetes:Non Diabetic:  <6%Goal of Therapy: <7%Additional Action Suggested:  >8%   . Direct LDL 07/14/2018 57.0  mg/dL Final   Optimal:  <100 mg/dLNear or Above Optimal:  100-129 mg/dLBorderline High:  130-159 mg/dLHigh:  160-189 mg/dLVery High:  >190 mg/dL    Allergies as of 07/18/2018   No Known Allergies     Medication List       Accurate as of July 18, 2018  9:56 AM. Always use your most recent med list.        Accu-Chek FastClix Lancets Misc USE AS INSTRUCTED TO CHECK BLOOD SUGAR 2 TIMES DAILY.   Accu-Chek Guide Me w/Device Kit by Does not apply route.   aspirin 81 MG EC tablet Take 1 tablet (81 mg total) by mouth daily.   blood glucose meter kit and supplies Dispense based on patient and insurance preference.  Use up to four times daily as directed. (FOR ICD-9 250.00, 250.01).   carvedilol 25 MG tablet Commonly known as:  COREG Take 25 mg by mouth 2 (two) times daily with a meal.   Digox 0.25 MG tablet Generic drug:  digoxin Take 0.125 mg by mouth daily.   Entresto 49-51 MG Generic drug:  sacubitril-valsartan TK 1 T PO BID   furosemide 40 MG tablet Commonly known as:  LASIX Take 1 tablet (40 mg total) by mouth  daily.   glimepiride 4 MG tablet Commonly known as:  AMARYL Take 4 mg by mouth 2 (two) times daily. Take 1 tablet by mouth twice daily.   glucose blood test strip Commonly known as:  Accu-Chek Guide Use as instructed to check blood sugar 2 times daily.   insulin detemir 100 UNIT/ML injection Commonly known as:  Levemir Inject 0.4 mLs (40 Units total) into the skin daily.   multivitamin with minerals Tabs tablet Take 1 tablet by mouth daily.   sildenafil 100 MG tablet Commonly known as:  VIAGRA TK 1 T PO D PRN   simvastatin 40 MG tablet Commonly known as:  ZOCOR Take 1 tablet (40 mg total) by mouth daily.   Trulicity 9.48 NI/6.2VO Sopn Generic drug:  Dulaglutide Inject 1.25 mg into the skin once a week.       Allergies: No Known Allergies  Past Medical History:  Diagnosis Date  . 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 Abscess of perineum   . Alcohol abuse   . Asthma    History of,  . Bronchitis   . CHF (congestive heart failure) ([place])   . COPD (chronic obstructive pulmonary disease) ([facility])    moderate obstruction with low vital capacity  . Croup   . Cystic acne    with acne keloidosis  . Diabetes mellitus without complication ([facility])    Type 2, uncontrolled with questionable improvement  . Dietary noncompliance    History of,  . Dizziness   . EKG, abnormal    History of, with negative cardiac evaluation by history.  . Erectile dysfunction    Multifactorial. Improved with Cialis. New onset.  . H/O folliculitis   . Heartburn   . 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 Hyperlipidemia   . Hypertension    variable in nature  . Indigestion   . Mild obesity   . Multiple excoriations    multifactorial in origin  . Situational stress    secondary to financial issues  . Tobacco abuse     Past Surgical History:  Procedure Laterality Date  . I&D EXTREMITY Left 06/28/2014   Procedure: IRRIGATION AND DEBRIDEMENT EXTREMITY;   Surgeon: Leanora Cover, MD;  Location: [facility];  Service: Orthopedics;  Laterality: Left;  . INCISION AND DRAINAGE PERIRECTAL ABSCESS N/A 02/17/2014   Procedure: IRRIGATION AND DEBRIDEMENT PERIRECTAL  AND SCROTAL ABSCESS;  Surgeon: Coralie Keens, MD;  Location: [facility];  Service: General;  Laterality: N/A;  . MINOR IRRIGATION AND DEBRIDEMENT OF WOUND Left 06/20/2015   Procedure: IRRIGATION AND DEBRIDEMENT Wound with nailbed repair;  Surgeon: Leanora Cover, MD;  Location: [facility];  Service: Orthopedics;  Laterality: Left;    Family History  Problem Relation Age of Onset  . Diabetes type II Mother   . Hypertension Mother   . Diabetes Other     Social History:  reports that he has been smoking cigarettes. He has a 20.00 pack-year smoking  history. He has never used smokeless tobacco. He reports current alcohol use of about 12.0 standard drinks of alcohol per week. He reports that he does not use drugs.   Review of Systems   Lipid history: Treated by PCP with simvastatin, has mixed hyperlipidemia    Lab Results  Component Value Date   CHOL 134 07/14/2018   HDL 26.50 (L) 07/14/2018   LDLCALC 96 09/23/2013   LDLDIRECT 57.0 07/14/2018   TRIG 257.0 (H) 07/14/2018   CHOLHDL 5 07/14/2018           Hypertension: Has been present and is on carvedilol and Entresto Followed by cardiologist Periodically will feel lightheaded after taking his medications but his cardiologist has not asked him to reduce his dosage. Because of his renal dysfunction and hyperkalemia he was asked to reduce the dose to half a tablet on the last visit but he did not do so  BP Readings from Last 3 Encounters:  05/30/18 102/64  04/10/18 140/82  12/11/17 140/89    Most recent eye exam was a few years ago  Most recent foot exam: 12/19  Currently known complications of diabetes: Erectile dysfunction, minimal neuropathy, microalbuminuria  CKD: He appears to have persistently abnormal renal function  which is slightly better  Lab Results  Component Value Date   CREATININE 1.63 (H) 07/14/2018   CREATININE 1.79 (H) 05/22/2018   CREATININE 1.5 (A) 09/22/2017   HYPERKALEMIA: He was told to follow a low potassium diet  Lab Results  Component Value Date   K 5.1 07/14/2018   HYPONATREMIA: His sodium is not as low but he still has not cut back on his fluid intake Unclear why he has hyponatremia  LABS:  Lab on 07/14/2018  Component Date Value Ref Range Status  . Cholesterol 07/14/2018 134  0 - 200 mg/dL Final   ATP III Classification       Desirable:  < 200 mg/dL               Borderline High:  200 - 239 mg/dL          High:  > = 240 mg/dL  . Triglycerides 07/14/2018 257.0* 0.0 - 149.0 mg/dL Final   Normal:  <150 mg/dLBorderline High:  150 - 199 mg/dL  . HDL 07/14/2018 26.50* >39.00 mg/dL Final  . VLDL 07/14/2018 51.4* 0.0 - 40.0 mg/dL Final  . Total CHOL/HDL Ratio 07/14/2018 5   Final                  Men          Women1/2 Average Risk     3.4          3.3Average Risk          5.0          4.42X Average Risk          9.6          7.13X Average Risk          15.0          11.0                      . NonHDL 07/14/2018 107.28   Final   NOTE:  Non-HDL goal should be 30 mg/dL higher than patient's LDL goal (i.e. LDL goal of < 70 mg/dL, would have non-HDL goal of < 100 mg/dL)  . Sodium 07/14/2018 129* 135 - 145 mEq/L Final  . Potassium 07/14/2018 5.1  3.5 - 5.1 mEq/L Final  . Chloride 07/14/2018 94* 96 - 112 mEq/L Final  . CO2 07/14/2018 28  19 - 32 mEq/L Final  . Glucose, Bld 07/14/2018 143* 70 - 99 mg/dL Final  . BUN 07/14/2018 18  6 - 23 mg/dL Final  . Creatinine, Ser 07/14/2018 1.63* 0.40 - 1.50 mg/dL Final  . Total Bilirubin 07/14/2018 0.4  0.2 - 1.2 mg/dL Final  . Alkaline Phosphatase 07/14/2018 114  39 - 117 U/L Final  . AST 07/14/2018 14  0 - 37 U/L Final  . ALT 07/14/2018 14  0 - 53 U/L Final  . Total Protein 07/14/2018 8.0  6.0 - 8.3 g/dL Final  . Albumin 07/14/2018 3.6   3.5 - 5.2 g/dL Final  . Calcium 07/14/2018 9.2  8.4 - 10.5 mg/dL Final  . GFR 07/14/2018 54.47* >60.00 mL/min Final  . Hgb A1c MFr Bld 07/14/2018 8.3* 4.6 - 6.5 % Final   Glycemic Control Guidelines for People with Diabetes:Non Diabetic:  <6%Goal of Therapy: <7%Additional Action Suggested:  >8%   . Direct LDL 07/14/2018 57.0  mg/dL Final   Optimal:  <100 mg/dLNear or Above Optimal:  100-129 mg/dLBorderline High:  130-159 mg/dLHigh:  160-189 mg/dLVery High:  >190 mg/dL    Physical Examination:  There were no vitals taken for this visit.      ASSESSMENT:  Diabetes type 2 on insulin  See history of present illness for detailed discussion of current diabetes management, blood sugar patterns and problems identified  His A1c is gradually improving but still above target  Unable to review his blood sugars because of lack of adequate monitoring, likely still has some postprandial hyperglycemia despite taking Trulicity Also has not seen the dietitian or started exercise as recommended  Renal dysfunction: This is slightly better However because of his lightheadedness and history of low normal blood pressure he needs to discuss with his cardiologist about cutting back on his diuretics and/or the Entresto  Hyponatremia: Etiology unclear, likely needs to cut back on fluid intake  LIPIDS: Although his LDL is 57 his triglycerides are still high and hopefully will improve with weight loss and better diabetes control, may however consider low-dose fenofibrate  PLAN:    1. Glucose monitoring: He was reminded to check blood sugars at least once a daily  Patient advised to check readings either fasting or 2 hours after meals  2.  Diabetes education: Patient will need meal planning for weight loss but he refuses to make an appointment even though referral has been done twice   3.  Lifestyle changes:  . Exercise regimen: He needs to start walking at least 15 to 20 minutes daily, he can do that  during the day  4.  Therapy changes needed: None currently without glucose data and information on postprandial readings  Consider mealtime insulin and this was discussed and described the differences between this and meals and insulin Discussed postprandial blood sugar targets of under 180  Also for convenience and better compliance with Levemir he needs to be switched to a once a day basal insulin He will call before he refills his Levemir and consider switching to [place] or TRESIBA whichever is covered, may also need smaller doses with this   Because of his renal dysfunction and low normal blood pressure with symptoms he needs to cut his Entresto by half tablet at least in the morning  . NEPHROLOGY consultation would be beneficial and he needs to discuss with PCP and cardiologist  .  HYPONATREMIA: Again reminded him to cut back on his fluid intake   6.  Follow-up: 8 weeks    There are no Patient Instructions on file for this visit.  Total visit time for evaluation and management of multiple problems and counseling =25 minutes   Elayne Snare 07/18/2018, 9:56 AM   Note: This office note was prepared with Dragon voice recognition system technology. Any transcriptional errors that result from this process are unintentional.

## 2018-08-15 ENCOUNTER — Other Ambulatory Visit: Payer: Self-pay

## 2018-08-15 ENCOUNTER — Telehealth: Payer: Self-pay

## 2018-08-15 MED ORDER — INSULIN DEGLUDEC 200 UNIT/ML ~~LOC~~ SOPN: 90.0000 [IU] | PEN_INJECTOR | Freq: Every day | SUBCUTANEOUS | 3 refills | Status: DC

## 2018-08-15 MED ORDER — TRULICITY 0.75 MG/0.5ML ~~LOC~~ SOAJ: 1.2500 mg | SUBCUTANEOUS | 1 refills | Status: DC

## 2018-08-15 NOTE — Telephone Encounter (Signed)
He was supposed to check which is covered by insurance but we can try [place] U-200.  He can start with 90 units once a day

## 2018-08-15 NOTE — Telephone Encounter (Signed)
Rx sent 

## 2018-08-15 NOTE — Telephone Encounter (Signed)
Rob from [employer] called requesting clarification for [employer]

## 2018-08-15 NOTE — Telephone Encounter (Signed)
Pt called and stated that he is almost out of Levemir. According to your last note, you had planned on changing him to [place] or [place].  Which would you prefer?

## 2018-09-19 ENCOUNTER — Encounter: Payer: Self-pay | Admitting: Podiatry

## 2018-09-19 ENCOUNTER — Ambulatory Visit: Payer: 59 | Admitting: Podiatry

## 2018-09-19 ENCOUNTER — Other Ambulatory Visit: Payer: Self-pay

## 2018-09-19 ENCOUNTER — Ambulatory Visit (INDEPENDENT_AMBULATORY_CARE_PROVIDER_SITE_OTHER): Payer: 59

## 2018-09-19 VITALS — Temp 97.7°F

## 2018-09-19 DIAGNOSIS — S99921A Unspecified injury of right foot, initial encounter: Secondary | ICD-10-CM | POA: Diagnosis not present

## 2018-09-19 DIAGNOSIS — S99911A Unspecified injury of right ankle, initial encounter: Secondary | ICD-10-CM

## 2018-09-19 DIAGNOSIS — R609 Edema, unspecified: Secondary | ICD-10-CM | POA: Diagnosis not present

## 2018-09-19 DIAGNOSIS — M7751 Other enthesopathy of right foot: Secondary | ICD-10-CM

## 2018-09-20 ENCOUNTER — Ambulatory Visit: Payer: 59 | Admitting: Endocrinology

## 2018-09-20 ENCOUNTER — Other Ambulatory Visit: Payer: Self-pay | Admitting: Podiatry

## 2018-09-20 ENCOUNTER — Ambulatory Visit (HOSPITAL_COMMUNITY)
Admission: RE | Admit: 2018-09-20 | Discharge: 2018-09-20 | Disposition: A | Discharge status: Home / Self Care | Payer: 59 | Source: Ambulatory Visit | Attending: Cardiology | Admitting: Cardiology

## 2018-09-20 DIAGNOSIS — R609 Edema, unspecified: Secondary | ICD-10-CM | POA: Insufficient documentation

## 2018-09-20 DIAGNOSIS — S99921A Unspecified injury of right foot, initial encounter: Secondary | ICD-10-CM | POA: Insufficient documentation

## 2018-09-20 DIAGNOSIS — S99911A Unspecified injury of right ankle, initial encounter: Secondary | ICD-10-CM | POA: Insufficient documentation

## 2018-09-21 LAB — CBC WITH DIFFERENTIAL/PLATELET
Basophils Absolute: 0 10*3/uL (ref 0.0–0.2)
Basos: 0 %
EOS (ABSOLUTE): 0.9 10*3/uL — ABNORMAL HIGH (ref 0.0–0.4)
Eos: 7 %
Hematocrit: 31.1 % — ABNORMAL LOW (ref 37.5–51.0)
Hemoglobin: 10.1 g/dL — ABNORMAL LOW (ref 13.0–17.7)
Immature Grans (Abs): 0.1 10*3/uL (ref 0.0–0.1)
Immature Granulocytes: 1 %
Lymphocytes Absolute: 1.4 10*3/uL (ref 0.7–3.1)
Lymphs: 11 %
MCH: 26.2 pg — ABNORMAL LOW (ref 26.6–33.0)
MCHC: 32.5 g/dL (ref 31.5–35.7)
MCV: 81 fL (ref 79–97)
Monocytes Absolute: 0.6 10*3/uL (ref 0.1–0.9)
Monocytes: 4 %
Neutrophils Absolute: 9.7 10*3/uL — ABNORMAL HIGH (ref 1.4–7.0)
Neutrophils: 77 %
Platelets: 452 10*3/uL — ABNORMAL HIGH (ref 150–450)
RBC: 3.85 x10E6/uL — ABNORMAL LOW (ref 4.14–5.80)
RDW: 16.3 % — ABNORMAL HIGH (ref 11.6–15.4)
WBC: 12.6 10*3/uL — ABNORMAL HIGH (ref 3.4–10.8)

## 2018-09-21 LAB — SEDIMENTATION RATE: Sed Rate: 87 mm/hr — ABNORMAL HIGH (ref 0–30)

## 2018-09-21 LAB — URIC ACID: Uric Acid: 9.9 mg/dL — ABNORMAL HIGH (ref 3.7–8.6)

## 2018-09-21 LAB — C-REACTIVE PROTEIN: CRP: 63 mg/L — ABNORMAL HIGH (ref 0–10)

## 2018-09-22 ENCOUNTER — Other Ambulatory Visit: Payer: Self-pay | Admitting: Podiatry

## 2018-09-22 ENCOUNTER — Telehealth: Payer: Self-pay | Admitting: *Deleted

## 2018-09-22 MED ORDER — ALLOPURINOL 100 MG PO TABS: 100.0000 mg | ORAL_TABLET | Freq: Every day | ORAL | 0 refills | Status: DC

## 2018-09-22 MED ORDER — DOXYCYCLINE HYCLATE 100 MG PO TABS: 100.0000 mg | ORAL_TABLET | Freq: Two times a day (BID) | ORAL | 0 refills | Status: DC

## 2018-09-22 NOTE — Progress Notes (Signed)
Subjective: 48 year old male presents the office today for concerns of possible injury to his right foot and ankle.  He does not know exactly what happened but he feels it is foot without blood underneath him about 2 weeks ago.  Has been icing, soaking but he has noticed swelling to his right foot and ankle.  He states that due to the swelling said to wear to different size shoes.  He is diabetic and his last A1c was 8.3.  Overall he feels well and denies any systemic complaints such as fevers, chills, nausea, vomiting. No acute changes since last appointment, and no other complaints at this time.   Objective: AAO x3, NAD DP/PT pulses palpable bilaterally, CRT less than 3 seconds There is moderate edema to the left lower extremity.  There is no pain with calf compression of erythema warmth.  There is diffuse tenderness to the ankle joint is able to move the ankle joint without any restrictions.  There is no area pinpoint tenderness.  No pain with Achilles tendon the Achilles tendon appears to be intact. No open lesions or pre-ulcerative lesions.  No pain with calf compression, swelling, warmth, erythema  Assessment: Significant swelling right lower extremity, possible injury  Plan: -All treatment options discussed with the patient including all alternatives, risks, complications.  -X-rays were obtained and reviewed.  Flatfoot is present.  No evidence of acute fracture identified today. -Venous duplex ordered to rule out DVT given swelling to his leg -Blood work ordered today including uric acid, ESR, CRP, CBC -Encouraged elevation. -Cam boot dispensed for immobilization. -Monitor for any clinical signs or symptoms of infection and directed to call the office immediately should any occur or go to the ER. -Patient encouraged to call the office with any questions, concerns, change in symptoms.   Trula Slade DPM

## 2018-09-22 NOTE — Telephone Encounter (Signed)
-----   Message from Trula Slade, DPM sent at 09/22/2018  7:23 AM EDT ----- Val- please let him know that his lab work all was high. I have called in doxycycline in case of infection and also allopurinol due to his uric acid level being high. Encourage elevation. If there are any signs of infection he needs to go to the ER. Otherwise please have him follow up next week. He was originally Dr. Amalia Hailey patient but I am happy to see him as well if needed. Can you ask him how he is doing? Thanks.

## 2018-09-22 NOTE — Telephone Encounter (Signed)
I informed pt of Dr. Leigh Aurora review of results and orders. Pt states understanding and would like to continue to see Dr. Jacqualyn Posey, and I transferred to schedules.

## 2018-09-25 ENCOUNTER — Other Ambulatory Visit: Payer: Self-pay

## 2018-09-25 ENCOUNTER — Other Ambulatory Visit (INDEPENDENT_AMBULATORY_CARE_PROVIDER_SITE_OTHER): Payer: 59

## 2018-09-25 DIAGNOSIS — Z794 Long term (current) use of insulin: Secondary | ICD-10-CM

## 2018-09-25 DIAGNOSIS — E1165 Type 2 diabetes mellitus with hyperglycemia: Secondary | ICD-10-CM | POA: Diagnosis not present

## 2018-09-25 LAB — BASIC METABOLIC PANEL
BUN: 25 mg/dL — ABNORMAL HIGH (ref 6–23)
CO2: 28 mEq/L (ref 19–32)
Calcium: 8.8 mg/dL (ref 8.4–10.5)
Chloride: 95 mEq/L — ABNORMAL LOW (ref 96–112)
Creatinine, Ser: 1.28 mg/dL (ref 0.40–1.50)
GFR: 71.94 mL/min (ref >60.00)
Glucose, Bld: 128 mg/dL — ABNORMAL HIGH (ref 70–99)
Potassium: 4.4 mEq/L (ref 3.5–5.1)
Sodium: 130 mEq/L — ABNORMAL LOW (ref 135–145)

## 2018-09-26 ENCOUNTER — Encounter: Payer: Self-pay | Admitting: Podiatry

## 2018-09-26 ENCOUNTER — Ambulatory Visit: Payer: 59 | Admitting: Podiatry

## 2018-09-26 DIAGNOSIS — R609 Edema, unspecified: Secondary | ICD-10-CM

## 2018-09-26 DIAGNOSIS — M1000 Idiopathic gout, unspecified site: Secondary | ICD-10-CM | POA: Diagnosis not present

## 2018-09-26 LAB — FRUCTOSAMINE: Fructosamine: 223 umol/L (ref 0–285)

## 2018-09-27 ENCOUNTER — Encounter: Payer: 59 | Admitting: Endocrinology

## 2018-09-27 ENCOUNTER — Other Ambulatory Visit: Payer: Self-pay

## 2018-09-27 ENCOUNTER — Encounter: Payer: Self-pay | Admitting: Endocrinology

## 2018-09-28 NOTE — Progress Notes (Signed)
This encounter was created in error - please disregard.

## 2018-10-02 NOTE — Progress Notes (Signed)
Subjective: 48 year old male presents the office today for follow-up evaluation of right foot and ankle swelling.  He states he has been on the allopurinol as well as the doxycycline and he states that the swelling is much improved.  Is able to wear a regular shoe and the same size the other foot.  He has no pain.  Denies any open sores or any areas of drainage.  He has not noticed any redness or warmth. Denies any systemic complaints such as fevers, chills, nausea, vomiting. No acute changes since last appointment, and no other complaints at this time.   Objective: AAO x3, NAD DP/PT pulses palpable bilaterally, CRT less than 3 seconds Much improved edema to the right lower extremity.  There is no pain with calf compression, erythema or warmth.  There are no open lesions identified at this time there is no clinical signs of infection.  Ankle, subtalar range of motion intact with any pain or restrictions. No open lesions or pre-ulcerative lesions.   Assessment: Improved swelling right side  Plan: -All treatment options discussed with the patient including all alternatives, risks, complications.  -I again reviewed the blood work as well as Doppler results. Complete the course of antibiotics. Allopurinol as well.  We will repeat blood work next week and an order was given. -Elevation -Patient encouraged to call the office with any questions, concerns, change in symptoms.   Trula Slade DPM

## 2018-10-05 NOTE — Progress Notes (Addendum)
Patient ID: Austin Alexander, male   DOB: 04-Mar-1971, 48 y.o.   MRN: [id number]           Reason for Appointment: Follow-up for Type 2 Diabetes    History of Present Illness:          Date of diagnosis of type 2 diabetes mellitus:  2009      Background history:   He is not clear when he was first diagnosed with diabetes Had been previously taking metformin and also for a few years Amaryl and Actos Insulin probably started in 2015 according to records and his A1c has been as high as 15.2 in the past He thinks Trulicity was added about a year ago At some point his A1c apparently has been 7-8% but does not know when  Recent history:   His A1c is further improved at 7.3 However fructosamine appears excellent at 223, improved  INSULIN regimen is: [place] 90 units daily  Previous regimen: Levemir 30 units a.m.--70 p.m. daily       Non-insulin hypoglycemic drugs the patient is taking are: Glimepiride 4 mg daily, Trulicity 1.5 mg weekly  Current management, blood sugar patterns and problems identified:  He still does not check his blood sugars and did not call to request help to be instructed  He is not taking [place] instead of Levemir and has taken that in the evenings  Lab glucose was 128 late morning and today it is 106  No hypoglycemic symptoms reported with switching to [place] and explained to him what hypoglycemic symptoms will be  He is still having no difficulties with taking Trulicity weekly  Regularly again and he thinks he checks it only when he does not feel well He is not checking his weight but not clear if he is losing any Still not motivated to do much exercise        Side effects from medications have been: None  Typical meal intake: Breakfast is  skipped, usually not fast food at lunch          Exercise:  Minimal  Glucose monitoring:  Not done       Glucometer:  Accu-Chek.       Blood Glucose readings previously about 200  Dietician visit, most  recent: years ago  Weight history:  Wt Readings from Last 3 Encounters:  10/06/18 287 lb 6.4 oz (130.4 kg)  05/30/18 286 lb (129.7 kg)  04/10/18 287 lb 9.6 oz (130.5 kg)    Glycemic control:   Lab Results  Component Value Date   HGBA1C 7.3 (A) 10/06/2018   HGBA1C 8.3 (H) 07/14/2018   HGBA1C 9.1 (A) 04/10/2018   Lab Results  Component Value Date   MICROALBUR 95.9 (H) 05/22/2018   LDLCALC 96 09/23/2013   CREATININE 1.28 09/25/2018   Lab Results  Component Value Date   MICRALBCREAT 56.2 (H) 05/22/2018    Lab Results  Component Value Date   FRUCTOSAMINE 223 09/25/2018   FRUCTOSAMINE 265 05/22/2018    Office Visit on 10/06/2018  Component Date Value Ref Range Status  . POC Glucose 10/06/2018 104* 70 - 99 mg/dl Final   2 cups coffee with little cream and sugar  . Hemoglobin A1C 10/06/2018 7.3* 4.0 - 5.6 % Final    Allergies as of 10/06/2018   No Known Allergies     Medication List       Accurate as of October 06, 2018  9:13 PM. If you have any questions, ask your nurse or doctor.  STOP taking these medications   doxycycline 100 MG tablet Commonly known as: VIBRA-TABS Stopped by: Elayne Snare, MD     TAKE these medications   Accu-Chek FastClix Lancets Misc USE AS INSTRUCTED TO CHECK BLOOD SUGAR 2 TIMES DAILY.   Accu-Chek Guide Me w/Device Kit by Does not apply route.   allopurinol 100 MG tablet Commonly known as: ZYLOPRIM Take 1 tablet (100 mg total) by mouth daily.   aspirin 81 MG EC tablet Take 1 tablet (81 mg total) by mouth daily.   blood glucose meter kit and supplies Dispense based on patient and insurance preference. Use up to four times daily as directed. (FOR ICD-9 250.00, 250.01).   carvedilol 25 MG tablet Commonly known as: COREG Take 25 mg by mouth 2 (two) times daily with a meal.   Digox 0.25 MG tablet Generic drug: digoxin Take 0.125 mg by mouth daily.   Entresto 49-51 MG Generic drug: sacubitril-valsartan TK 1 T PO BID    furosemide 40 MG tablet Commonly known as: LASIX Take 1 tablet (40 mg total) by mouth daily. What changed: how much to take   glimepiride 4 MG tablet Commonly known as: AMARYL Take 4 mg by mouth 2 (two) times daily. Take 1 tablet by mouth twice daily.   glucose blood test strip Commonly known as: Accu-Chek Guide Use as instructed to check blood sugar 2 times daily.   Insulin Degludec 200 UNIT/ML Sopn Commonly known as: [place] FlexTouch Inject 90 Units into the skin daily. Inject 90 units under the skin once daily.   multivitamin with minerals Tabs tablet Take 1 tablet by mouth daily.   sildenafil 100 MG tablet Commonly known as: VIAGRA TK 1 T PO D PRN   simvastatin 40 MG tablet Commonly known as: ZOCOR Take 1 tablet (40 mg total) by mouth daily.   Trulicity 1.5 TJ/0.3ES Sopn Generic drug: Dulaglutide Inject 1.5 mg into the skin once a week. Inject 1.68m under the skin once weekly.       Allergies: No Known Allergies  Past Medical History:  Diagnosis Date  . 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 Abscess of perineum   . Alcohol abuse   . Asthma    History of,  . Bronchitis   . CHF (congestive heart failure) ([facility]   . COPD (chronic obstructive pulmonary disease) (HCC)    moderate obstruction with low vital capacity  . Croup   . Cystic acne    with acne keloidosis  . Diabetes mellitus without complication (HCC)    Type 2, uncontrolled with questionable improvement  . Dietary noncompliance    History of,  . Dizziness   . EKG, abnormal    History of, with negative cardiac evaluation by history.  . Erectile dysfunction    Multifactorial. Improved with Cialis. New onset.  . H/O folliculitis   . Heartburn   . 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 Hyperlipidemia   . Hypertension    variable in nature  . Indigestion   . Mild obesity   . Multiple excoriations    multifactorial in origin  . Situational stress     secondary to financial issues  . Tobacco abuse     Past Surgical History:  Procedure Laterality Date  . I&D EXTREMITY Left 06/28/2014   Procedure: IRRIGATION AND DEBRIDEMENT EXTREMITY;  Surgeon: KLeanora Cover MD;  Location: [facility]  Service: Orthopedics;  Laterality: Left;  . INCISION AND DRAINAGE PERIRECTAL  ABSCESS N/A 02/17/2014   Procedure: IRRIGATION AND DEBRIDEMENT PERIRECTAL  AND SCROTAL ABSCESS;  Surgeon: Coralie Keens, MD;  Location: [facility];  Service: General;  Laterality: N/A;  . MINOR IRRIGATION AND DEBRIDEMENT OF WOUND Left 06/20/2015   Procedure: IRRIGATION AND DEBRIDEMENT Wound with nailbed repair;  Surgeon: Leanora Cover, MD;  Location: [facility];  Service: Orthopedics;  Laterality: Left;    Family History  Problem Relation Age of Onset  . Diabetes type II Mother   . Hypertension Mother   . Diabetes Other     Social History:  reports that he has been smoking cigarettes. He has a 20.00 pack-year smoking history. He has never used smokeless tobacco. He reports current alcohol use of about 12.0 standard drinks of alcohol per week. He reports that he does not use drugs.   Review of Systems   Lipid history: Treated by PCP with simvastatin, has mixed hyperlipidemia    Lab Results  Component Value Date   CHOL 134 07/14/2018   HDL 26.50 (L) 07/14/2018   LDLCALC 96 09/23/2013   LDLDIRECT 57.0 07/14/2018   TRIG 257.0 (H) 07/14/2018   CHOLHDL 5 07/14/2018           Hypertension: Has been present and is on carvedilol and Entresto Followed by cardiologist Previously blood pressure was low normal and was told to reduce his Entresto to half a tablet but he is still taking full tablet He thinks his blood pressure is higher today because of smoking before he came in  BP Readings from Last 3 Encounters:  10/06/18 (!) 132/96  05/30/18 102/64  04/10/18 140/82     Most recent foot exam: 12/19  Currently known complications of diabetes: Erectile dysfunction,  minimal neuropathy, microalbuminuria Needs eye exam  CKD: He appears to have persistently abnormal renal function which is slightly better  Lab Results  Component Value Date   CREATININE 1.28 09/25/2018   CREATININE 1.63 (H) 07/14/2018   CREATININE 1.79 (H) 05/22/2018   HYPERKALEMIA: He was told to follow a low potassium diet  Lab Results  Component Value Date   K 4.4 09/25/2018   HYPONATREMIA: His sodium is still low Not on diuretics like HCTZ Unclear why he has hyponatremia  LABS:  Office Visit on 10/06/2018  Component Date Value Ref Range Status  . POC Glucose 10/06/2018 104* 70 - 99 mg/dl Final   2 cups coffee with little cream and sugar  . Hemoglobin A1C 10/06/2018 7.3* 4.0 - 5.6 % Final    Physical Examination:  BP (!) 132/96 (BP Location: Left Arm, Patient Position: Sitting, Cuff Size: Normal)   Pulse 93   Temp 98.8 F (37.1 C) (Oral)   Ht '5\' 11"'$  (1.803 m)   Wt 287 lb 6.4 oz (130.4 kg)   SpO2 96%   BMI 40.08 kg/m       ASSESSMENT:  Diabetes type 2 on insulin  See history of present illness for detailed discussion of current diabetes management, blood sugar patterns and problems identified  His A1c is improving and now 7.3  His blood sugars may be better with using [place] compared to Levemir Also taking 10 units less Blood sugars in the lab or in the office are near normal Although he is generally trying to improve his lifestyle he can do better with exercise  Unable to review his blood sugars because of lack of home glucose monitoring Today he was instructed on how to use his meter in the office  Discussed how  often and when to check his blood sugar  HYPONATREMIA: Asymptomatic but sodium 130; we will defer management to cardiologist  Renal dysfunction: Appears improved  HYPERTENSION: Not clear whether this is partly office hypertension but he will need to follow-up with his cardiologist and PCP  PLAN:    1. Glucose monitoring: He is going  to check blood sugars at least once a daily and he thinks he can start doing this now after being shown how to use the meter  Patient advised to check readings either fasting or 2 hours after meals Reminded him of of both postprandial and fasting blood sugar targets  2.  Diabetes education: Patient will need meal planning for weight loss, has previously not made appointments with dietitian despite referrals Will send referral again  3.  Lifestyle changes:  . Exercise regimen: He needs to start walking at least 15 to 20 minutes daily and reminded him to do this  4.  Treatment plan: Currently will continue the same regimen Discussed that his A1c is improving but still not at goal and weight loss is important  May consider Ozempic if his postprandial readings are consistently high despite good diet and replace Trulicity If he has tendency to hypoglycemia may consider reducing Tyler Aas but he will call if this happens  Total visit time for evaluation and management of multiple problems and counseling =25 minutes  There are no Patient Instructions on file for this visit.    Elayne Snare 10/06/2018, 9:13 PM   Note: This office note was prepared with Dragon voice recognition system technology. Any transcriptional errors that result from this process are unintentional.

## 2018-10-06 ENCOUNTER — Ambulatory Visit: Payer: 59 | Admitting: Endocrinology

## 2018-10-06 ENCOUNTER — Other Ambulatory Visit: Payer: Self-pay | Admitting: Podiatry

## 2018-10-06 ENCOUNTER — Encounter: Payer: Self-pay | Admitting: Endocrinology

## 2018-10-06 ENCOUNTER — Other Ambulatory Visit: Payer: Self-pay

## 2018-10-06 VITALS — BP 132/96 | HR 93 | Temp 98.8°F | Ht 71.0 in | Wt 287.4 lb

## 2018-10-06 DIAGNOSIS — I10 Essential (primary) hypertension: Secondary | ICD-10-CM

## 2018-10-06 DIAGNOSIS — E1165 Type 2 diabetes mellitus with hyperglycemia: Secondary | ICD-10-CM

## 2018-10-06 DIAGNOSIS — Z794 Long term (current) use of insulin: Secondary | ICD-10-CM | POA: Diagnosis not present

## 2018-10-06 DIAGNOSIS — N289 Disorder of kidney and ureter, unspecified: Secondary | ICD-10-CM

## 2018-10-06 LAB — POCT GLYCOSYLATED HEMOGLOBIN (HGB A1C): Hemoglobin A1C: 7.3 % — AB (ref 4.0–5.6)

## 2018-10-06 LAB — GLUCOSE, POCT (MANUAL RESULT ENTRY): POC Glucose: 104 mg/dl — AB (ref 70–99)

## 2018-10-07 LAB — CBC WITH DIFFERENTIAL/PLATELET
Basophils Absolute: 0.1 10*3/uL (ref 0.0–0.2)
Basos: 1 %
EOS (ABSOLUTE): 0.7 10*3/uL — ABNORMAL HIGH (ref 0.0–0.4)
Eos: 8 %
Hematocrit: 31.4 % — ABNORMAL LOW (ref 37.5–51.0)
Hemoglobin: 10.4 g/dL — ABNORMAL LOW (ref 13.0–17.7)
Immature Grans (Abs): 0 10*3/uL (ref 0.0–0.1)
Immature Granulocytes: 1 %
Lymphocytes Absolute: 1.4 10*3/uL (ref 0.7–3.1)
Lymphs: 16 %
MCH: 25.9 pg — ABNORMAL LOW (ref 26.6–33.0)
MCHC: 33.1 g/dL (ref 31.5–35.7)
MCV: 78 fL — ABNORMAL LOW (ref 79–97)
Monocytes Absolute: 0.6 10*3/uL (ref 0.1–0.9)
Monocytes: 6 %
Neutrophils Absolute: 6.1 10*3/uL (ref 1.4–7.0)
Neutrophils: 68 %
Platelets: 406 10*3/uL (ref 150–450)
RBC: 4.01 x10E6/uL — ABNORMAL LOW (ref 4.14–5.80)
RDW: 16.4 % — ABNORMAL HIGH (ref 11.6–15.4)
WBC: 8.8 10*3/uL (ref 3.4–10.8)

## 2018-10-07 LAB — URIC ACID: Uric Acid: 8.5 mg/dL (ref 3.7–8.6)

## 2018-10-07 LAB — C-REACTIVE PROTEIN: CRP: 37 mg/L — ABNORMAL HIGH (ref 0–10)

## 2018-10-07 LAB — SEDIMENTATION RATE: Sed Rate: 69 mm/hr — ABNORMAL HIGH (ref 0–30)

## 2018-10-10 ENCOUNTER — Other Ambulatory Visit: Payer: Self-pay

## 2018-10-10 ENCOUNTER — Ambulatory Visit: Payer: 59 | Admitting: Podiatry

## 2018-10-10 DIAGNOSIS — M1000 Idiopathic gout, unspecified site: Secondary | ICD-10-CM | POA: Diagnosis not present

## 2018-10-10 DIAGNOSIS — R609 Edema, unspecified: Secondary | ICD-10-CM | POA: Diagnosis not present

## 2018-10-10 MED ORDER — ALLOPURINOL 100 MG PO TABS: 100.0000 mg | ORAL_TABLET | Freq: Every day | ORAL | 0 refills | Status: DC

## 2018-10-10 NOTE — Progress Notes (Signed)
Subjective: 48 year old male presents the office today for follow-up evaluation of right foot and ankle swelling.  Overall states he is doing better.  Some tightness type feeling on top of his foot at the end of the day but overall is doing much better.  Finish the course of doxycycline is on allopurinol 100 mg daily.  He states the swelling to his leg is much improved.  He has no new concerns.  No recent injury or changes otherwise. Denies any systemic complaints such as fevers, chills, nausea, vomiting. No acute changes since last appointment, and no other complaints at this time.   Objective: AAO x3, NAD DP/PT pulses palpable bilaterally, CRT less than 3 seconds There is still swelling present on the right lower extremity however much improved.  There is no erythema or warmth.  There is no open sores there is no drainage.  There is no area pinpoint tenderness.  Ankle, subtalar range of motion intact.  Equinus is present. No open lesions or pre-ulcerative lesions.   Assessment: Improved swelling right side; gout  Plan: -All treatment options discussed with the patient including all alternatives, risks, complications.  -I reviewed the blood work results with him.  His ESR, CRP, uric acid is improving.  White blood cell count normal.  I will recheck in 1 month.  Continue allopurinol 100 mg daily.  Elevation.  There is any reoccurrence or worsening to let me know.  Discussed general stretching exercises.  Trula Slade DPM

## 2018-10-10 NOTE — Patient Instructions (Signed)

## 2018-11-21 ENCOUNTER — Other Ambulatory Visit: Payer: Self-pay | Admitting: Podiatry

## 2018-11-21 ENCOUNTER — Encounter: Payer: Self-pay | Admitting: Podiatry

## 2018-11-21 ENCOUNTER — Ambulatory Visit (INDEPENDENT_AMBULATORY_CARE_PROVIDER_SITE_OTHER): Payer: 59 | Admitting: Podiatry

## 2018-11-21 ENCOUNTER — Ambulatory Visit (INDEPENDENT_AMBULATORY_CARE_PROVIDER_SITE_OTHER): Payer: 59

## 2018-11-21 ENCOUNTER — Other Ambulatory Visit: Payer: Self-pay

## 2018-11-21 VITALS — Temp 97.5°F

## 2018-11-21 DIAGNOSIS — M779 Enthesopathy, unspecified: Secondary | ICD-10-CM

## 2018-11-21 DIAGNOSIS — E0842 Diabetes mellitus due to underlying condition with diabetic polyneuropathy: Secondary | ICD-10-CM | POA: Diagnosis not present

## 2018-11-21 DIAGNOSIS — R609 Edema, unspecified: Secondary | ICD-10-CM

## 2018-11-21 LAB — CBC WITH DIFFERENTIAL/PLATELET
Basophils Absolute: 0.1 10*3/uL (ref 0.0–0.2)
Basos: 1 %
EOS (ABSOLUTE): 0.8 10*3/uL — ABNORMAL HIGH (ref 0.0–0.4)
Eos: 8 %
Hematocrit: 29.3 % — ABNORMAL LOW (ref 37.5–51.0)
Hemoglobin: 9.5 g/dL — ABNORMAL LOW (ref 13.0–17.7)
Immature Grans (Abs): 0.1 10*3/uL (ref 0.0–0.1)
Immature Granulocytes: 1 %
Lymphocytes Absolute: 1.2 10*3/uL (ref 0.7–3.1)
Lymphs: 12 %
MCH: 25.1 pg — ABNORMAL LOW (ref 26.6–33.0)
MCHC: 32.4 g/dL (ref 31.5–35.7)
MCV: 78 fL — ABNORMAL LOW (ref 79–97)
Monocytes Absolute: 0.6 10*3/uL (ref 0.1–0.9)
Monocytes: 6 %
Neutrophils Absolute: 7.2 10*3/uL — ABNORMAL HIGH (ref 1.4–7.0)
Neutrophils: 72 %
Platelets: 437 10*3/uL (ref 150–450)
RBC: 3.78 x10E6/uL — ABNORMAL LOW (ref 4.14–5.80)
RDW: 16 % — ABNORMAL HIGH (ref 11.6–15.4)
WBC: 9.9 10*3/uL (ref 3.4–10.8)

## 2018-11-21 LAB — BASIC METABOLIC PANEL
BUN/Creatinine Ratio: 14 (ref 9–20)
BUN: 24 mg/dL (ref 6–24)
CO2: 24 mmol/L (ref 20–29)
Calcium: 8.7 mg/dL (ref 8.7–10.2)
Chloride: 91 mmol/L — ABNORMAL LOW (ref 96–106)
Creatinine, Ser: 1.72 mg/dL — ABNORMAL HIGH (ref 0.76–1.27)
GFR calc Af Amer: 52 mL/min/{1.73_m2} — ABNORMAL LOW (ref >59)
GFR calc non Af Amer: 45 mL/min/{1.73_m2} — ABNORMAL LOW (ref >59)
Glucose: 187 mg/dL — ABNORMAL HIGH (ref 65–99)
Potassium: 5 mmol/L (ref 3.5–5.2)
Sodium: 129 mmol/L — ABNORMAL LOW (ref 134–144)

## 2018-11-21 LAB — SEDIMENTATION RATE: Sed Rate: 82 mm/hr — ABNORMAL HIGH (ref 0–30)

## 2018-11-21 LAB — URIC ACID: Uric Acid: 7.5 mg/dL (ref 3.7–8.6)

## 2018-11-21 LAB — C-REACTIVE PROTEIN: CRP: 81 mg/L — ABNORMAL HIGH (ref 0–10)

## 2018-11-22 ENCOUNTER — Other Ambulatory Visit (HOSPITAL_COMMUNITY): Payer: Self-pay | Admitting: Podiatry

## 2018-11-22 ENCOUNTER — Other Ambulatory Visit: Payer: Self-pay | Admitting: Podiatry

## 2018-11-22 ENCOUNTER — Telehealth: Payer: Self-pay | Admitting: *Deleted

## 2018-11-22 ENCOUNTER — Ambulatory Visit (HOSPITAL_COMMUNITY)
Admission: RE | Admit: 2018-11-22 | Discharge: 2018-11-22 | Disposition: A | Discharge status: Home / Self Care | Payer: 59 | Source: Ambulatory Visit | Attending: Cardiology | Admitting: Cardiology

## 2018-11-22 DIAGNOSIS — M79662 Pain in left lower leg: Secondary | ICD-10-CM | POA: Insufficient documentation

## 2018-11-22 DIAGNOSIS — M7989 Other specified soft tissue disorders: Secondary | ICD-10-CM

## 2018-11-22 DIAGNOSIS — R609 Edema, unspecified: Secondary | ICD-10-CM

## 2018-11-22 MED ORDER — DOXYCYCLINE HYCLATE 100 MG PO CAPS: 100.0000 mg | ORAL_CAPSULE | Freq: Two times a day (BID) | ORAL | 0 refills | Status: DC

## 2018-11-22 MED ORDER — DOXYCYCLINE HYCLATE 100 MG PO TABS: 100.0000 mg | ORAL_TABLET | Freq: Two times a day (BID) | ORAL | 0 refills | Status: DC

## 2018-11-22 NOTE — Telephone Encounter (Signed)
I informed pt of Dr. Leigh Aurora orders and sent the doxycycline to the [facility].

## 2018-11-22 NOTE — Progress Notes (Signed)
Subjective: 48-year-old male presents the office today for concerns of swelling to the left leg which started about 1 week ago.  He says the right side is been doing better.  He denies any recent injury.  Denies any pain with the leg but noticed swelling.  He does have a cardiologist, Dr. Harwani he reports he last saw him a couple months ago. Denies any systemic complaints such as fevers, chills, nausea, vomiting. No acute changes since last appointment, and no other complaints at this time.   Objective: AAO x3, NAD DP/PT pulses palpable bilaterally, CRT less than 3 seconds There is swelling present to bilateral lower extremities however there is increased on the left secondary the right side today.  There is no pain with calf compression there is no erythema or warmth.  There is no area of skin breakdown. No open lesions or pre-ulcerative lesions.  No pain with calf compression, swelling, warmth, erythema  Assessment: Lower extremity edema  Plan: -All treatment options discussed with the patient including all alternatives, risks, complications.  -I reviewed the blood work that I have so far.  Unfortunately his gait function has decreased.  Also his CRP has increased.  Still waiting ESR.  I do not follow-up with his primary care physician as well as cardiologist.  I did apply Ace bandage for compression. -Not been taking his Lasix as prescribed.  Discussed taking it as prescribed to help with swelling. -We will order venous duplex as well. -Patient encouraged to call the office with any questions, concerns, change in symptoms.   Matthew R Wagoner DPM   

## 2018-11-22 NOTE — Telephone Encounter (Signed)
Austin Alexander - [facility] states pt venous doppler was negative for DVT, there is superficial edema and enlarged lymph nodes, possible cellulitis. Pt has been informed of negative DVT.

## 2018-11-22 NOTE — Telephone Encounter (Signed)
Thank you :)

## 2018-11-22 NOTE — Telephone Encounter (Signed)
[facility] - Salena Saner states she will transfer to the doppler scheduling office and they will contact pt to schedule. Keisha scheduled pt for today at 4:00pm arrive 3:45pm. Faxed orders to [facility].

## 2018-11-22 NOTE — Telephone Encounter (Signed)
I informed pt of Dr. Leigh Aurora orders and appt with [place] today at 3:45pm arrival and testing at 4:00pm.

## 2018-11-22 NOTE — Telephone Encounter (Signed)
It didn't appear to be cellulitis yesterday when I saw him but I did just call in doxycycline for him if you could let him know. He also needs to follow up with his PCP. Could you send the results and the blood work/clinic notes to his PCP and cardiologist? Thanks.

## 2018-11-22 NOTE — Telephone Encounter (Signed)
-----   Message from Trula Slade, DPM sent at 11/22/2018 10:17 AM EDT ----- Can you order a venous duplex for the left lower extremity to be done today if he can? He came in yesterday with worsening swelling to the left side. I do want to get an ultrasound of that leg. Can you get him in today and also let him know that I would like to do this? Thanks.

## 2018-11-23 NOTE — Telephone Encounter (Addendum)
I called pt and he states his cardiologist is Dr. Terrence Dupont [phone]. I called Dr. Zenia Resides office, fax [phone]. I faxed Dr. Leigh Aurora clinicals to Dr. Terrence Dupont.

## 2018-11-23 NOTE — Telephone Encounter (Signed)
I called Dr. Randall Hiss Dean's office to make certain pt was established there, and was informed he was and given fax [phone]. Faxed Dr. Leigh Aurora clinicals to Dr. Kevan Ny.

## 2018-11-28 ENCOUNTER — Ambulatory Visit: Payer: 59 | Admitting: Podiatry

## 2018-12-07 ENCOUNTER — Encounter

## 2018-12-07 ENCOUNTER — Ambulatory Visit: Payer: 59 | Admitting: Dietician

## 2018-12-23 ENCOUNTER — Other Ambulatory Visit: Payer: Self-pay | Admitting: Endocrinology

## 2019-01-03 ENCOUNTER — Other Ambulatory Visit (INDEPENDENT_AMBULATORY_CARE_PROVIDER_SITE_OTHER): Payer: 59

## 2019-01-03 ENCOUNTER — Other Ambulatory Visit: Payer: Self-pay

## 2019-01-03 DIAGNOSIS — Z794 Long term (current) use of insulin: Secondary | ICD-10-CM

## 2019-01-03 DIAGNOSIS — E1165 Type 2 diabetes mellitus with hyperglycemia: Secondary | ICD-10-CM | POA: Diagnosis not present

## 2019-01-03 LAB — COMPREHENSIVE METABOLIC PANEL
ALT: 13 U/L (ref 0–53)
AST: 14 U/L (ref 0–37)
Albumin: 3.4 g/dL — ABNORMAL LOW (ref 3.5–5.2)
Alkaline Phosphatase: 144 U/L — ABNORMAL HIGH (ref 39–117)
BUN: 37 mg/dL — ABNORMAL HIGH (ref 6–23)
CO2: 27 mEq/L (ref 19–32)
Calcium: 9.4 mg/dL (ref 8.4–10.5)
Chloride: 93 mEq/L — ABNORMAL LOW (ref 96–112)
Creatinine, Ser: 1.66 mg/dL — ABNORMAL HIGH (ref 0.40–1.50)
GFR: 53.23 mL/min — ABNORMAL LOW (ref >60.00)
Glucose, Bld: 156 mg/dL — ABNORMAL HIGH (ref 70–99)
Potassium: 4.8 mEq/L (ref 3.5–5.1)
Sodium: 127 mEq/L — ABNORMAL LOW (ref 135–145)
Total Bilirubin: 0.4 mg/dL (ref 0.2–1.2)
Total Protein: 8.4 g/dL — ABNORMAL HIGH (ref 6.0–8.3)

## 2019-01-03 LAB — HEMOGLOBIN A1C: Hgb A1c MFr Bld: 7.8 % — ABNORMAL HIGH (ref 4.6–6.5)

## 2019-01-07 NOTE — Progress Notes (Signed)
This encounter was created in error - please disregard.

## 2019-01-08 ENCOUNTER — Encounter: Payer: 59 | Admitting: Endocrinology

## 2019-01-08 ENCOUNTER — Other Ambulatory Visit: Payer: Self-pay

## 2019-01-08 ENCOUNTER — Encounter: Payer: Self-pay | Admitting: Endocrinology

## 2019-01-08 ENCOUNTER — Ambulatory Visit: Payer: 59 | Admitting: Endocrinology

## 2019-01-22 ENCOUNTER — Other Ambulatory Visit: Payer: Self-pay | Admitting: Endocrinology

## 2019-01-23 ENCOUNTER — Ambulatory Visit (INDEPENDENT_AMBULATORY_CARE_PROVIDER_SITE_OTHER): Payer: 59 | Admitting: Endocrinology

## 2019-01-23 ENCOUNTER — Other Ambulatory Visit: Payer: Self-pay

## 2019-01-23 ENCOUNTER — Encounter: Payer: Self-pay | Admitting: Endocrinology

## 2019-01-23 DIAGNOSIS — E1165 Type 2 diabetes mellitus with hyperglycemia: Secondary | ICD-10-CM | POA: Diagnosis not present

## 2019-01-23 DIAGNOSIS — Z794 Long term (current) use of insulin: Secondary | ICD-10-CM

## 2019-01-23 DIAGNOSIS — E871 Hypo-osmolality and hyponatremia: Secondary | ICD-10-CM

## 2019-01-23 MED ORDER — OZEMPIC (0.25 OR 0.5 MG/DOSE) 2 MG/1.5ML ~~LOC~~ SOPN: 0.5000 mg | PEN_INJECTOR | SUBCUTANEOUS | 2 refills | Status: DC

## 2019-01-23 NOTE — Progress Notes (Signed)
Patient ID: [place] Alexander, male   DOB: 11-02-1970, 48 y.o.   MRN: [id number]           Reason for Appointment: Follow-up for Type 2 Diabetes  Today's office visit was provided via telemedicine using video technique The patient was explained the limitations of evaluation and management by telemedicine and the availability of in person appointments.  The patient understood the limitations and agreed to proceed. Patient also understood that the telehealth visit is billable. . Location of the patient: Patient's home . Location of the provider: Physician office Only the patient and myself were participating in the encounter     History of Present Illness:          Date of diagnosis of type 2 diabetes mellitus:  2009      Background history:   He is not clear when he was first diagnosed with diabetes Had been previously taking metformin and also for a few years Amaryl and Actos Insulin probably started in 2015 according to records and his A1c has been as high as 15.2 in the past He thinks Trulicity was added about a year ago At some point his A1c apparently has been 7-8% but does not know when  Recent history:   His A1c is further improved at 7.3 However fructosamine appears excellent at 223, improved  INSULIN regimen is: [place] 90 units daily   Non-insulin hypoglycemic drugs the patient is taking are: Glimepiride 4 mg daily, Trulicity 1.5 mg weekly  Current management, blood sugar patterns and problems identified:  He still does not check his blood sugars and did not call to request help to be instructed  He is not taking [place] instead of Levemir and has taken that in the evenings  Lab glucose was 128 late morning and today it is 106  No hypoglycemic symptoms reported with switching to [place] and explained to him what hypoglycemic symptoms will be  He is still having no difficulties with taking Trulicity weekly  Regularly again and he thinks he checks it only when he  does not feel well He is not checking his weight but not clear if he is losing any Still not motivated to do much exercise        Side effects from medications have been: None  Typical meal intake: Breakfast is  skipped, usually not fast food at lunch          Exercise:  Minimal  Glucose monitoring:  Not done       Glucometer:  Accu-Chek.       Blood Glucose readings previously about 200  Dietician visit, most recent: years ago  Weight history:  Wt Readings from Last 3 Encounters:  10/06/18 287 lb 6.4 oz (130.4 kg)  05/30/18 286 lb (129.7 kg)  04/10/18 287 lb 9.6 oz (130.5 kg)    Glycemic control:   Lab Results  Component Value Date   HGBA1C 7.8 (H) 01/03/2019   HGBA1C 7.3 (A) 10/06/2018   HGBA1C 8.3 (H) 07/14/2018   Lab Results  Component Value Date   MICROALBUR 95.9 (H) 05/22/2018   LDLCALC 96 09/23/2013   CREATININE 1.66 (H) 01/03/2019   Lab Results  Component Value Date   MICRALBCREAT 56.2 (H) 05/22/2018    Lab Results  Component Value Date   FRUCTOSAMINE 223 09/25/2018   FRUCTOSAMINE 265 05/22/2018    No visits with results within 1 Week(s) from this visit.  Latest known visit with results is:  Lab on 01/03/2019  Component Date  Value Ref Range Status  . Sodium 01/03/2019 127* 135 - 145 mEq/L Final  . Potassium 01/03/2019 4.8  3.5 - 5.1 mEq/L Final  . Chloride 01/03/2019 93* 96 - 112 mEq/L Final  . CO2 01/03/2019 27  19 - 32 mEq/L Final  . Glucose, Bld 01/03/2019 156* 70 - 99 mg/dL Final  . BUN 01/03/2019 37* 6 - 23 mg/dL Final  . Creatinine, Ser 01/03/2019 1.66* 0.40 - 1.50 mg/dL Final  . Total Bilirubin 01/03/2019 0.4  0.2 - 1.2 mg/dL Final  . Alkaline Phosphatase 01/03/2019 144* 39 - 117 U/L Final  . AST 01/03/2019 14  0 - 37 U/L Final  . ALT 01/03/2019 13  0 - 53 U/L Final  . Total Protein 01/03/2019 8.4* 6.0 - 8.3 g/dL Final  . Albumin 01/03/2019 3.4* 3.5 - 5.2 g/dL Final  . Calcium 01/03/2019 9.4  8.4 - 10.5 mg/dL Final  . GFR 01/03/2019  53.23* >60.00 mL/min Final  . Hgb A1c MFr Bld 01/03/2019 7.8* 4.6 - 6.5 % Final   Glycemic Control Guidelines for People with Diabetes:Non Diabetic:  <6%Goal of Therapy: <7%Additional Action Suggested:  >8%     Allergies as of 01/23/2019   No Known Allergies     Medication List       Accurate as of January 23, 2019  3:40 PM. If you have any questions, ask your nurse or doctor.        Accu-Chek FastClix Lancets Misc USE AS INSTRUCTED TO CHECK BLOOD SUGAR 2 TIMES DAILY.   Accu-Chek Guide Me w/Device Kit by Does not apply route.   allopurinol 100 MG tablet Commonly known as: ZYLOPRIM Take 1 tablet (100 mg total) by mouth daily.   aspirin 81 MG EC tablet Take 1 tablet (81 mg total) by mouth daily.   blood glucose meter kit and supplies Dispense based on patient and insurance preference. Use up to four times daily as directed. (FOR ICD-9 250.00, 250.01).   carvedilol 25 MG tablet Commonly known as: COREG Take 25 mg by mouth 2 (two) times daily with a meal.   Digox 0.25 MG tablet Generic drug: digoxin Take 0.125 mg by mouth daily.   doxycycline 100 MG tablet Commonly known as: VIBRA-TABS Take 1 tablet (100 mg total) by mouth 2 (two) times daily.   doxycycline 100 MG capsule Commonly known as: VIBRAMYCIN Take 1 capsule (100 mg total) by mouth 2 (two) times daily.   Entresto 49-51 MG Generic drug: sacubitril-valsartan TK 1 T PO BID   furosemide 40 MG tablet Commonly known as: LASIX Take 1 tablet (40 mg total) by mouth daily. What changed: how much to take   glimepiride 4 MG tablet Commonly known as: AMARYL Take 4 mg by mouth 2 (two) times daily. Take 1 tablet by mouth twice daily.   glucose blood test strip Commonly known as: Accu-Chek Guide Use as instructed to check blood sugar 2 times daily.   Insulin Degludec 200 UNIT/ML Sopn Commonly known as: [place] FlexTouch Inject 90 Units into the skin daily. Inject 90 units under the skin once daily.    multivitamin with minerals Tabs tablet Take 1 tablet by mouth daily.   sildenafil 100 MG tablet Commonly known as: VIAGRA TK 1 T PO D PRN   simvastatin 40 MG tablet Commonly known as: ZOCOR Take 1 tablet (40 mg total) by mouth daily.   Trulicity 1.5 KK/9.3GH Sopn Generic drug: Dulaglutide INJECT 1.5MG INTO THE SKIN ONCE A WEEK  Allergies: No Known Allergies  Past Medical History:  Diagnosis Date  . 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 Abscess of perineum   . Alcohol abuse   . Asthma    History of,  . Bronchitis   . CHF (congestive heart failure) ([facility])   . COPD (chronic obstructive pulmonary disease) ([facility])    moderate obstruction with low vital capacity  . Croup   . Cystic acne    with acne keloidosis  . Diabetes mellitus without complication ([facility])    Type 2, uncontrolled with questionable improvement  . Dietary noncompliance    History of,  . Dizziness   . EKG, abnormal    History of, with negative cardiac evaluation by history.  . Erectile dysfunction    Multifactorial. Improved with Cialis. New onset.  . H/O folliculitis   . Heartburn   . 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 Hyperlipidemia   . Hypertension    variable in nature  . Indigestion   . Mild obesity   . Multiple excoriations    multifactorial in origin  . Situational stress    secondary to financial issues  . Tobacco abuse     Past Surgical History:  Procedure Laterality Date  . I&D EXTREMITY Left 06/28/2014   Procedure: IRRIGATION AND DEBRIDEMENT EXTREMITY;  Surgeon: Leanora Cover, MD;  Location: [place] [facility];  Service: Orthopedics;  Laterality: Left;  . INCISION AND DRAINAGE PERIRECTAL ABSCESS N/A 02/17/2014   Procedure: IRRIGATION AND DEBRIDEMENT PERIRECTAL  AND SCROTAL ABSCESS;  Surgeon: Coralie Keens, MD;  Location: [facility];  Service: General;  Laterality: N/A;  . MINOR IRRIGATION AND DEBRIDEMENT OF WOUND Left 06/20/2015   Procedure: IRRIGATION AND  DEBRIDEMENT Wound with nailbed repair;  Surgeon: Leanora Cover, MD;  Location: [facility];  Service: Orthopedics;  Laterality: Left;    Family History  Problem Relation Age of Onset  . Diabetes type II Mother   . Hypertension Mother   . Diabetes Other     Social History:  reports that he has been smoking cigarettes. He has a 20.00 pack-year smoking history. He has never used smokeless tobacco. He reports current alcohol use of about 12.0 standard drinks of alcohol per week. He reports that he does not use drugs.   Review of Systems   Lipid history: Treated by PCP with simvastatin, has mixed hyperlipidemia    Lab Results  Component Value Date   CHOL 134 07/14/2018   HDL 26.50 (L) 07/14/2018   LDLCALC 96 09/23/2013   LDLDIRECT 57.0 07/14/2018   TRIG 257.0 (H) 07/14/2018   CHOLHDL 5 07/14/2018           Hypertension: Has been present and is on carvedilol and Entresto Followed by cardiologist   BP Readings from Last 3 Encounters:  10/06/18 (!) 132/96  05/30/18 102/64  04/10/18 140/82     Most recent foot exam: 12/19  Currently known complications of diabetes: Erectile dysfunction, minimal neuropathy, microalbuminuria Needs eye exam, currently does not have [place] ophthalmologist  CKD: He appears to have persistently abnormal renal function of unclear etiology  Lab Results  Component Value Date   CREATININE 1.66 (H) 01/03/2019   CREATININE 1.72 (H) 11/20/2018   CREATININE 1.28 09/25/2018   HYPERKALEMIA: Better now, he was told to follow a low potassium diet  Lab Results  Component Value Date   K 4.8 01/03/2019    HYPONATREMIA: His sodium is still low as of 9/20  Not on diuretics like HCTZ Unclear why he has hyponatremia  LABS:  No visits with results within 1 Week(s) from this visit.  Latest known visit with results is:  Lab on 01/03/2019  Component Date Value Ref Range Status  . Sodium 01/03/2019 127* 135 - 145 mEq/L Final  . Potassium  01/03/2019 4.8  3.5 - 5.1 mEq/L Final  . Chloride 01/03/2019 93* 96 - 112 mEq/L Final  . CO2 01/03/2019 27  19 - 32 mEq/L Final  . Glucose, Bld 01/03/2019 156* 70 - 99 mg/dL Final  . BUN 01/03/2019 37* 6 - 23 mg/dL Final  . Creatinine, Ser 01/03/2019 1.66* 0.40 - 1.50 mg/dL Final  . Total Bilirubin 01/03/2019 0.4  0.2 - 1.2 mg/dL Final  . Alkaline Phosphatase 01/03/2019 144* 39 - 117 U/L Final  . AST 01/03/2019 14  0 - 37 U/L Final  . ALT 01/03/2019 13  0 - 53 U/L Final  . Total Protein 01/03/2019 8.4* 6.0 - 8.3 g/dL Final  . Albumin 01/03/2019 3.4* 3.5 - 5.2 g/dL Final  . Calcium 01/03/2019 9.4  8.4 - 10.5 mg/dL Final  . GFR 01/03/2019 53.23* >60.00 mL/min Final  . Hgb A1c MFr Bld 01/03/2019 7.8* 4.6 - 6.5 % Final   Glycemic Control Guidelines for People with Diabetes:Non Diabetic:  <6%Goal of Therapy: <7%Additional Action Suggested:  >8%     Physical Examination:  There were no vitals taken for this visit.      ASSESSMENT:  Diabetes type 2 on insulin  See history of present illness for detailed discussion of current diabetes management, blood sugar patterns and problems identified  His A1c is gone back up to 7.8 from 7.3  Difficult to assess his level of control because of lack of adequate glucose monitoring at home His late morning glucose was about 150 but not sure if he has consistently high fasting readings with his current dose of basal insulin He is not always consistent with diet and drinks containing simple sugar Also not doing enough exercise  HYPONATREMIA: Asymptomatic even with sodium 127 last month  Renal dysfunction: Appears consistently impaired and will defer management to nephrologist and PCP   PLAN:    1. Glucose monitoring: He needs to start checking his blood sugars regularly.  Currently not taking readings fasting to help adjust his insulin and needs to go this also Also needs to check some blood sugars after meals regularly to help improve his diet  Stressed the importance of regular checking and bring the meter to the office for download  2.  Needs to start exercising with brisk walking daily  3.  He will need to completely lemonade regular soft drinks and high sugar foods and drinks like milkshakes  4.  Treatment plan: Trulicity will be changed to [facility] Likely this will help him with his overall control, weight loss and postprandial hyperglycemia  Discussed dosage adjustment and he will start with 0.25 mg for the first 2 injections and then 0.5 mg until his next visit May also consider 1 mg subsequently  He will call if he has higher readings fasting consistently  5.  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    Elayne Snare 01/23/2019, 3:40 PM   Note: This office note was prepared with Dragon voice recognition system technology. Any transcriptional errors that result from this process are unintentional.

## 2019-01-26 ENCOUNTER — Other Ambulatory Visit: Payer: Self-pay | Admitting: Endocrinology

## 2019-03-19 LAB — HM DIABETES EYE EXAM

## 2019-04-18 ENCOUNTER — Telehealth: Payer: Self-pay

## 2019-04-18 NOTE — Telephone Encounter (Signed)
Patient has been scheduled for 05/09/19

## 2019-04-18 NOTE — Telephone Encounter (Signed)
-----   Message from Elayne Snare, MD sent at 04/16/2019  2:28 PM EST ----- Regarding: Follow-up  needed Patient overdue for appointment.  Please call to schedule with labs before appointment

## 2019-05-04 ENCOUNTER — Other Ambulatory Visit: Payer: Self-pay

## 2019-05-04 ENCOUNTER — Other Ambulatory Visit (INDEPENDENT_AMBULATORY_CARE_PROVIDER_SITE_OTHER): Payer: 59

## 2019-05-04 DIAGNOSIS — E1165 Type 2 diabetes mellitus with hyperglycemia: Secondary | ICD-10-CM

## 2019-05-04 DIAGNOSIS — E871 Hypo-osmolality and hyponatremia: Secondary | ICD-10-CM | POA: Diagnosis not present

## 2019-05-04 DIAGNOSIS — Z794 Long term (current) use of insulin: Secondary | ICD-10-CM | POA: Diagnosis not present

## 2019-05-04 LAB — COMPREHENSIVE METABOLIC PANEL
ALT: 15 U/L (ref 0–53)
AST: 12 U/L (ref 0–37)
Albumin: 3.5 g/dL (ref 3.5–5.2)
Alkaline Phosphatase: 144 U/L — ABNORMAL HIGH (ref 39–117)
BUN: 40 mg/dL — ABNORMAL HIGH (ref 6–23)
CO2: 28 mEq/L (ref 19–32)
Calcium: 9.3 mg/dL (ref 8.4–10.5)
Chloride: 91 mEq/L — ABNORMAL LOW (ref 96–112)
Creatinine, Ser: 2.01 mg/dL — ABNORMAL HIGH (ref 0.40–1.50)
GFR: 42.63 mL/min — ABNORMAL LOW (ref >60.00)
Glucose, Bld: 458 mg/dL — ABNORMAL HIGH (ref 70–99)
Potassium: 5 mEq/L (ref 3.5–5.1)
Sodium: 127 mEq/L — ABNORMAL LOW (ref 135–145)
Total Bilirubin: 0.4 mg/dL (ref 0.2–1.2)
Total Protein: 8.6 g/dL — ABNORMAL HIGH (ref 6.0–8.3)

## 2019-05-04 LAB — HEMOGLOBIN A1C: Hgb A1c MFr Bld: 10.9 % — ABNORMAL HIGH (ref 4.6–6.5)

## 2019-05-09 ENCOUNTER — Encounter: Payer: Self-pay | Admitting: Endocrinology

## 2019-05-09 ENCOUNTER — Ambulatory Visit (INDEPENDENT_AMBULATORY_CARE_PROVIDER_SITE_OTHER): Payer: 59 | Admitting: Endocrinology

## 2019-05-09 ENCOUNTER — Other Ambulatory Visit: Payer: Self-pay

## 2019-05-09 VITALS — BP 122/74 | HR 92 | Ht 71.0 in | Wt 284.6 lb

## 2019-05-09 DIAGNOSIS — E871 Hypo-osmolality and hyponatremia: Secondary | ICD-10-CM

## 2019-05-09 DIAGNOSIS — Z794 Long term (current) use of insulin: Secondary | ICD-10-CM | POA: Diagnosis not present

## 2019-05-09 DIAGNOSIS — E1165 Type 2 diabetes mellitus with hyperglycemia: Secondary | ICD-10-CM | POA: Diagnosis not present

## 2019-05-09 LAB — GLUCOSE, POCT (MANUAL RESULT ENTRY): POC Glucose: 173 mg/dl — AB (ref 70–99)

## 2019-05-09 MED ORDER — NOVOLOG FLEXPEN 100 UNIT/ML ~~LOC~~ SOPN: PEN_INJECTOR | SUBCUTANEOUS | 1 refills | Status: DC

## 2019-05-09 MED ORDER — ACCU-CHEK GUIDE VI STRP: ORAL_STRIP | 3 refills | Status: DC

## 2019-05-09 NOTE — Progress Notes (Signed)
Patient ID: Austin Alexander, male   DOB: [birth year], 49 y.o.   MRN: [id number]           Reason for Appointment: Follow-up for Type 2 Diabetes   History of Present Illness:          Date of diagnosis of type 2 diabetes mellitus:  2009      Background history:   He is not clear when he was first diagnosed with diabetes Had been previously taking metformin and also for a few years Amaryl and Actos Insulin probably started in 2015 according to records and his A1c has been as high as 15.2 in the past He thinks Trulicity was added about a year ago At some point his A1c apparently has been 7-8% but does not know when  Recent history:   His A1c is much higher at 10.9 compared to 7.3   INSULIN regimen is: [place] 90 units daily   Non-insulin hypoglycemic drugs the patient is taking are: Glimepiride 4 mg daily, Ozempic 0.5 mg weekly  Current management, blood sugar patterns and problems identified:  He still does not check his blood sugars regularly and did not bring any records today  Not clear if his test strips are expired since he thinks that a few days ago his blood sugar was 117  However cannot explain why his blood sugar was 458 in the lab in the morning, may have had a biscuit before this  He was switched from Trulicity to [place] and he has done 2 injections of the 0.25 dose and 2 injections of the 0.5 dose  Does not think he has missed any doses  Also he does not think he has gone off his diet with excessive soft drinks, fried food or snacks  Will drink some juice sometimes  Does not do any formal exercise and his weight is about the same  He was asked to show the actual amount of insulin he is taking on the sample pen and he appears to be doing this accurately  Also does not think he has missed any Tresiba doses        Side effects from medications have been: None  Typical meal intake: Breakfast is sometimes skipped, usually not fast food at lunch           Exercise:  Minimal  Glucose monitoring:         Glucometer:  Accu-Chek.       Blood Glucose readings as above  Dietician visit, most recent: years ago  Weight history:  Wt Readings from Last 3 Encounters:  05/09/19 284 lb 9.6 oz (129.1 kg)  10/06/18 287 lb 6.4 oz (130.4 kg)  05/30/18 286 lb (129.7 kg)    Glycemic control:   Lab Results  Component Value Date   HGBA1C 10.9 (H) 05/04/2019   HGBA1C 7.8 (H) 01/03/2019   HGBA1C 7.3 (A) 10/06/2018   Lab Results  Component Value Date   MICROALBUR 95.9 (H) 05/22/2018   LDLCALC 96 09/23/2013   CREATININE 2.01 (H) 05/04/2019   Lab Results  Component Value Date   MICRALBCREAT 56.2 (H) 05/22/2018    Lab Results  Component Value Date   FRUCTOSAMINE 223 09/25/2018   FRUCTOSAMINE 265 05/22/2018    Office Visit on 05/09/2019  Component Date Value Ref Range Status  . POC Glucose 05/09/2019 173* 70 - 99 mg/dl Final  Lab on 05/04/2019  Component Date Value Ref Range Status  . Sodium 05/04/2019 127* 135 - 145 mEq/L Final  .  Potassium 05/04/2019 5.0  3.5 - 5.1 mEq/L Final  . Chloride 05/04/2019 91* 96 - 112 mEq/L Final  . CO2 05/04/2019 28  19 - 32 mEq/L Final  . Glucose, Bld 05/04/2019 458* 70 - 99 mg/dL Final  . BUN 05/04/2019 40* 6 - 23 mg/dL Final  . Creatinine, Ser 05/04/2019 2.01* 0.40 - 1.50 mg/dL Final  . Total Bilirubin 05/04/2019 0.4  0.2 - 1.2 mg/dL Final  . Alkaline Phosphatase 05/04/2019 144* 39 - 117 U/L Final  . AST 05/04/2019 12  0 - 37 U/L Final  . ALT 05/04/2019 15  0 - 53 U/L Final  . Total Protein 05/04/2019 8.6* 6.0 - 8.3 g/dL Final  . Albumin 05/04/2019 3.5  3.5 - 5.2 g/dL Final  . GFR 05/04/2019 42.63* >60.00 mL/min Final  . Calcium 05/04/2019 9.3  8.4 - 10.5 mg/dL Final  . Hgb A1c MFr Bld 05/04/2019 10.9* 4.6 - 6.5 % Final   Glycemic Control Guidelines for People with Diabetes:Non Diabetic:  <6%Goal of Therapy: <7%Additional Action Suggested:  >8%     Allergies as of 05/09/2019   No Known Allergies      Medication List       Accurate as of May 09, 2019  4:59 PM. If you have any questions, ask your nurse or doctor.        STOP taking these medications   doxycycline 100 MG capsule Commonly known as: VIBRAMYCIN Stopped by: Elayne Snare, MD   doxycycline 100 MG tablet Commonly known as: VIBRA-TABS Stopped by: Elayne Snare, MD   glimepiride 4 MG tablet Commonly known as: AMARYL Stopped by: Elayne Snare, MD   Trulicity 1.5 ZJ/6.9CV Sopn Generic drug: Dulaglutide Stopped by: Elayne Snare, MD     TAKE these medications   Accu-Chek FastClix Lancets Misc USE AS INSTRUCTED TO CHECK BLOOD SUGAR 2 TIMES DAILY.   Accu-Chek Guide Me w/Device Kit by Does not apply route.   Accu-Chek Guide test strip Generic drug: glucose blood Use as instructed to check blood sugar 2 times daily.   allopurinol 100 MG tablet Commonly known as: ZYLOPRIM Take 1 tablet (100 mg total) by mouth daily.   aspirin 81 MG EC tablet Take 1 tablet (81 mg total) by mouth daily.   blood glucose meter kit and supplies Dispense based on patient and insurance preference. Use up to four times daily as directed. (FOR ICD-9 250.00, 250.01).   carvedilol 25 MG tablet Commonly known as: COREG Take 25 mg by mouth 2 (two) times daily with a meal.   Digox 0.25 MG tablet Generic drug: digoxin Take 0.125 mg by mouth daily.   Entresto 49-51 MG Generic drug: sacubitril-valsartan TK 1 T PO BID   furosemide 40 MG tablet Commonly known as: LASIX Take 1 tablet (40 mg total) by mouth daily. What changed: how much to take   multivitamin with minerals Tabs tablet Take 1 tablet by mouth daily.   NovoLOG FlexPen 100 UNIT/ML FlexPen Generic drug: insulin aspart 10-15 units before each meal Started by: Elayne Snare, MD   Ozempic (0.25 or 0.5 MG/DOSE) 2 MG/1.5ML Sopn Generic drug: Semaglutide(0.25 or 0.5MG/DOS) Inject 0.5 mg into the skin once a week. Take 0.25 mg weekly for the first 2 injections   sildenafil 100  MG tablet Commonly known as: VIAGRA TK 1 T PO D PRN   simvastatin 40 MG tablet Commonly known as: ZOCOR Take 1 tablet (40 mg total) by mouth daily.   Tyler Aas FlexTouch 200 UNIT/ML Sopn Generic drug: Insulin  Degludec ADMINISTER 90 UNITS UNDER THE SKIN EVERY DAY       Allergies: No Known Allergies  Past Medical History:  Diagnosis Date  . 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 Abscess of perineum   . Alcohol abuse   . Asthma    History of,  . Bronchitis   . CHF (congestive heart failure) ([place])   . COPD (chronic obstructive pulmonary disease) (H[facility])    moderate obstruction with low vital capacity  . Croup   . Cystic acne    with acne keloidosis  . Diabetes mellitus without complication (H[facility])    Type 2, uncontrolled with questionable improvement  . Dietary noncompliance    History of,  . Dizziness   . EKG, abnormal    History of, with negative cardiac evaluation by history.  . Erectile dysfunction    Multifactorial. Improved with Cialis. New onset.  . H/O folliculitis   . Heartburn   . 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 Hyperlipidemia   . Hypertension    variable in nature  . Indigestion   . Mild obesity   . Multiple excoriations    multifactorial in origin  . Situational stress    secondary to financial issues  . Tobacco abuse     Past Surgical History:  Procedure Laterality Date  . I & D EXTREMITY Left 06/28/2014   Procedure: IRRIGATION AND DEBRIDEMENT EXTREMITY;  Surgeon: Leanora Cover, MD;  Location: [place];  Service: Orthopedics;  Laterality: Left;  . INCISION AND DRAINAGE PERIRECTAL ABSCESS N/A 02/17/2014   Procedure: IRRIGATION AND DEBRIDEMENT PERIRECTAL  AND SCROTAL ABSCESS;  Surgeon: Coralie Keens, MD;  Location: [place];  Service: General;  Laterality: N/A;  . MINOR IRRIGATION AND DEBRIDEMENT OF WOUND Left 06/20/2015   Procedure: IRRIGATION AND DEBRIDEMENT Wound with nailbed repair;  Surgeon: Leanora Cover, MD;   Location: [place];  Service: Orthopedics;  Laterality: Left;    Family History  Problem Relation Age of Onset  . Diabetes type II Mother   . Hypertension Mother   . Diabetes Other     Social History:  reports that he has been smoking cigarettes. He has a 20.00 pack-year smoking history. He has never used smokeless tobacco. He reports current alcohol use of about 12.0 standard drinks of alcohol per week. He reports that he does not use drugs.   Review of Systems   Lipid history: Treated by PCP with simvastatin, has mixed hyperlipidemia    Lab Results  Component Value Date   CHOL 134 07/14/2018   HDL 26.50 (L) 07/14/2018   LDLCALC 96 09/23/2013   LDLDIRECT 57.0 07/14/2018   TRIG 257.0 (H) 07/14/2018   CHOLHDL 5 07/14/2018           Hypertension: Has been treated with carvedilol and Entresto Followed by cardiologist   BP Readings from Last 3 Encounters:  05/09/19 122/74  10/06/18 (!) 132/96  05/30/18 102/64     Most recent foot exam: 12/19  Currently known complications of diabetes: Erectile dysfunction, minimal neuropathy, microalbuminuria Needs eye exam, currently does not have an ophthalmologist  CKD: Appears to be somewhat worse He said he had some infection recently and was given antibiotics  Lab Results  Component Value Date   CREATININE 2.01 (H) 05/04/2019   CREATININE 1.66 (H) 01/03/2019   CREATININE 1.72 (H) 11/20/2018   HYPERKALEMIA: Resolved he was told to follow a low potassium diet  Lab Results  Component Value Date   K 5.0 05/04/2019    HYPONATREMIA: His sodium is still low but asymptomatic Not on diuretics like HCTZ Unclear why he has hyponatremia but his blood sugar was also very high on this measurement  No recent swelling of the legs  LABS:  Office Visit on 05/09/2019  Component Date Value Ref Range Status  . POC Glucose 05/09/2019 173* 70 - 99 mg/dl Final  Lab on 05/04/2019  Component Date Value Ref Range  Status  . Sodium 05/04/2019 127* 135 - 145 mEq/L Final  . Potassium 05/04/2019 5.0  3.5 - 5.1 mEq/L Final  . Chloride 05/04/2019 91* 96 - 112 mEq/L Final  . CO2 05/04/2019 28  19 - 32 mEq/L Final  . Glucose, Bld 05/04/2019 458* 70 - 99 mg/dL Final  . BUN 05/04/2019 40* 6 - 23 mg/dL Final  . Creatinine, Ser 05/04/2019 2.01* 0.40 - 1.50 mg/dL Final  . Total Bilirubin 05/04/2019 0.4  0.2 - 1.2 mg/dL Final  . Alkaline Phosphatase 05/04/2019 144* 39 - 117 U/L Final  . AST 05/04/2019 12  0 - 37 U/L Final  . ALT 05/04/2019 15  0 - 53 U/L Final  . Total Protein 05/04/2019 8.6* 6.0 - 8.3 g/dL Final  . Albumin 05/04/2019 3.5  3.5 - 5.2 g/dL Final  . GFR 05/04/2019 42.63* >60.00 mL/min Final  . Calcium 05/04/2019 9.3  8.4 - 10.5 mg/dL Final  . Hgb A1c MFr Bld 05/04/2019 10.9* 4.6 - 6.5 % Final   Glycemic Control Guidelines for People with Diabetes:Non Diabetic:  <6%Goal of Therapy: <7%Additional Action Suggested:  >8%     Physical Examination:  BP 122/74 (BP Location: Left Arm, Patient Position: Sitting, Cuff Size: Large)   Pulse 92   Ht '5\' 11"'  (1.803 m)   Wt 284 lb 9.6 oz (129.1 kg)   SpO2 96%   BMI 39.69 kg/m       ASSESSMENT:  Diabetes type 2 on insulin  See history of present illness for detailed discussion of current diabetes management, blood sugar patterns and problems identified  His A1c is gone back up to 10.9  Difficult to assess his home blood sugars but also not clear why his blood sugars are markedly increased compared to his last visit in October 2020 Blood sugars are as high as 458  Today fasting blood sugar is 173 and likely has high postprandial readings with current regimen of basal insulin and Ozempic His late morning glucose was about 150 but not sure if he has consistently high fasting readings with his current dose of basal insulin He is not always consistent with diet and drinks containing simple sugar Also not doing enough exercise  HYPONATREMIA:  Asymptomatic even with sodium 127 as before  Renal dysfunction: Appears consistently impaired and will defer management to nephrologist and PCP   PLAN:     He will make sure he has an expired test strips and new prescription also sent  He needs to start checking sugars twice a day and given him target for both fasting and after meal readings on a separate instruction sheet  Discussed that his blood sugars are much higher and with home monitoring will be able to adjust his insulin regimen better  Today discussed in detail the need for mealtime insulin to cover postprandial spikes, action of mealtime insulin, use of the insulin pen, timing and action of the rapid acting insulin as well as starting dose and dosage titration to target the two-hour reading of  under 180  He will start with 10 units for his meals unless eating out or eating a large meal he will take 15 units  Also his sugars 2 hours after eating are over 180 he will go up to 15 units or more  Given him information and co-pay card for NovoLog  Also given co-pay card for Tyler Aas since he is concerned about high co-pay  He will stay with 0.5 mg Ozempic weekly but consider increasing the dose on the next visit  Start walking regularly for exercise  Avoid excessive fluids, he will need to discuss his hyponatremia also with nephrologist and cardiologist  Stop glimepiride  No change in Tresiba at this time  More regular follow-up  Patient Instructions  Stop Glimeperide and start Novolog as directed     Elayne Snare 05/09/2019, 4:59 PM   Note: This office note was prepared with Dragon voice recognition system technology. Any transcriptional errors that result from this process are unintentional.

## 2019-05-09 NOTE — Patient Instructions (Addendum)
Stop Glimeperide and start Novolog as directed

## 2019-05-10 ENCOUNTER — Other Ambulatory Visit: Payer: Self-pay

## 2019-05-10 MED ORDER — INSULIN LISPRO (1 UNIT DIAL) 100 UNIT/ML (KWIKPEN): 10.0000 [IU] | PEN_INJECTOR | Freq: Three times a day (TID) | SUBCUTANEOUS | 2 refills | Status: DC

## 2019-06-18 ENCOUNTER — Other Ambulatory Visit: Payer: Self-pay

## 2019-06-18 ENCOUNTER — Other Ambulatory Visit: Payer: 59

## 2019-06-18 MED ORDER — TRESIBA FLEXTOUCH 200 UNIT/ML ~~LOC~~ SOPN: PEN_INJECTOR | SUBCUTANEOUS | 2 refills | Status: DC

## 2019-06-21 ENCOUNTER — Other Ambulatory Visit: Payer: Self-pay

## 2019-06-21 ENCOUNTER — Ambulatory Visit: Payer: 59 | Admitting: Endocrinology

## 2019-06-21 ENCOUNTER — Other Ambulatory Visit (INDEPENDENT_AMBULATORY_CARE_PROVIDER_SITE_OTHER): Payer: 59

## 2019-06-21 DIAGNOSIS — E1165 Type 2 diabetes mellitus with hyperglycemia: Secondary | ICD-10-CM | POA: Diagnosis not present

## 2019-06-21 DIAGNOSIS — Z794 Long term (current) use of insulin: Secondary | ICD-10-CM | POA: Diagnosis not present

## 2019-06-21 NOTE — Progress Notes (Deleted)
Patient ID: Austin Alexander, male   DOB: 11-Feb-1971, 49 y.o.   MRN: [id number]           Reason for Appointment: Follow-up for Type 2 Diabetes   History of Present Illness:          Date of diagnosis of type 2 diabetes mellitus:  2009      Background history:   He is not clear when he was first diagnosed with diabetes Had been previously taking metformin and also for a few years Amaryl and Actos Insulin probably started in 2015 according to records and his A1c has been as high as 15.2 in the past He thinks Trulicity was added about a year ago At some point his A1c apparently has been 7-8% but does not know when  Recent history:   His A1c is much higher at 10.9 compared to 7.3   INSULIN regimen is: [place] 90 units daily   Non-insulin hypoglycemic drugs the patient is taking are: Glimepiride 4 mg daily, Ozempic 0.5 mg weekly  Current management, blood sugar patterns and problems identified:  He still does not check his blood sugars regularly and did not bring any records today  Not clear if his test strips are expired since he thinks that a few days ago his blood sugar was 117  However cannot explain why his blood sugar was 458 in the lab in the morning, may have had a biscuit before this  He was switched from Trulicity to [place] and he has done 2 injections of the 0.25 dose and 2 injections of the 0.5 dose  Does not think he has missed any doses  Also he does not think he has gone off his diet with excessive soft drinks, fried food or snacks  Will drink some juice sometimes  Does not do any formal exercise and his weight is about the same  He was asked to show the actual amount of insulin he is taking on the sample pen and he appears to be doing this accurately  Also does not think he has missed any Tresiba doses        Side effects from medications have been: None  Typical meal intake: Breakfast is sometimes skipped, usually not fast food at lunch           Exercise:  Minimal  Glucose monitoring:         Glucometer:  Accu-Chek.       Blood Glucose readings as above  Dietician visit, most recent: years ago  Weight history:  Wt Readings from Last 3 Encounters:  05/09/19 284 lb 9.6 oz (129.1 kg)  10/06/18 287 lb 6.4 oz (130.4 kg)  05/30/18 286 lb (129.7 kg)    Glycemic control:   Lab Results  Component Value Date   HGBA1C 10.9 (H) 05/04/2019   HGBA1C 7.8 (H) 01/03/2019   HGBA1C 7.3 (A) 10/06/2018   Lab Results  Component Value Date   MICROALBUR 95.9 (H) 05/22/2018   LDLCALC 96 09/23/2013   CREATININE 2.01 (H) 05/04/2019   Lab Results  Component Value Date   MICRALBCREAT 56.2 (H) 05/22/2018    Lab Results  Component Value Date   FRUCTOSAMINE 223 09/25/2018   FRUCTOSAMINE 265 05/22/2018    No visits with results within 1 Week(s) from this visit.  Latest known visit with results is:  Office Visit on 05/09/2019  Component Date Value Ref Range Status  . POC Glucose 05/09/2019 173* 70 - 99 mg/dl Final    Allergies  as of 06/21/2019   No Known Allergies     Medication List       Accurate as of June 21, 2019  1:06 PM. If you have any questions, ask your nurse or doctor.        Accu-Chek FastClix Lancets Misc USE AS INSTRUCTED TO CHECK BLOOD SUGAR 2 TIMES DAILY.   Accu-Chek Guide Me w/Device Kit by Does not apply route.   Accu-Chek Guide test strip Generic drug: glucose blood Use as instructed to check blood sugar 2 times daily.   allopurinol 100 MG tablet Commonly known as: ZYLOPRIM Take 1 tablet (100 mg total) by mouth daily.   aspirin 81 MG EC tablet Take 1 tablet (81 mg total) by mouth daily.   blood glucose meter kit and supplies Dispense based on patient and insurance preference. Use up to four times daily as directed. (FOR ICD-9 250.00, 250.01).   carvedilol 25 MG tablet Commonly known as: COREG Take 25 mg by mouth 2 (two) times daily with a meal.   Digox 0.25 MG tablet Generic drug:  digoxin Take 0.125 mg by mouth daily.   Entresto 49-51 MG Generic drug: sacubitril-valsartan TK 1 T PO BID   furosemide 40 MG tablet Commonly known as: LASIX Take 1 tablet (40 mg total) by mouth daily. What changed: how much to take   insulin lispro 100 UNIT/ML KwikPen Commonly known as: HumaLOG KwikPen Inject 0.1-0.15 mLs (10-15 Units total) into the skin 3 (three) times daily.   multivitamin with minerals Tabs tablet Take 1 tablet by mouth daily.   Ozempic (0.25 or 0.5 MG/DOSE) 2 MG/1.5ML Sopn Generic drug: Semaglutide(0.25 or 0.5MG/DOS) Inject 0.5 mg into the skin once a week. Take 0.25 mg weekly for the first 2 injections   sildenafil 100 MG tablet Commonly known as: VIAGRA TK 1 T PO D PRN   simvastatin 40 MG tablet Commonly known as: ZOCOR Take 1 tablet (40 mg total) by mouth daily.   Tyler Aas FlexTouch 200 UNIT/ML FlexTouch Pen Generic drug: insulin degludec ADMINISTER 90 UNITS UNDER THE SKIN EVERY DAY       Allergies: No Known Allergies  Past Medical History:  Diagnosis Date  . 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 Abscess of perineum   . Alcohol abuse   . Asthma    History of,  . Bronchitis   . CHF (congestive heart failure) ([place])   . COPD (chronic obstructive pulmonary disease) ([facility])    moderate obstruction with low vital capacity  . Croup   . Cystic acne    with acne keloidosis  . Diabetes mellitus without complication ([facility])    Type 2, uncontrolled with questionable improvement  . Dietary noncompliance    History of,  . Dizziness   . EKG, abnormal    History of, with negative cardiac evaluation by history.  . Erectile dysfunction    Multifactorial. Improved with Cialis. New onset.  . H/O folliculitis   . Heartburn   . 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 Hyperlipidemia   . Hypertension    variable in nature  . Indigestion   . Mild obesity   . Multiple excoriations    multifactorial in origin  .  Situational stress    secondary to financial issues  . Tobacco abuse     Past Surgical History:  Procedure Laterality Date  . I & D EXTREMITY Left 06/28/2014   Procedure: IRRIGATION AND DEBRIDEMENT EXTREMITY;  Surgeon:  Leanora Cover, MD;  Location: [place];  Service: Orthopedics;  Laterality: Left;  . INCISION AND DRAINAGE PERIRECTAL ABSCESS N/A 02/17/2014   Procedure: IRRIGATION AND DEBRIDEMENT PERIRECTAL  AND SCROTAL ABSCESS;  Surgeon: Coralie Keens, MD;  Location: [place];  Service: General;  Laterality: N/A;  . MINOR IRRIGATION AND DEBRIDEMENT OF WOUND Left 06/20/2015   Procedure: IRRIGATION AND DEBRIDEMENT Wound with nailbed repair;  Surgeon: Leanora Cover, MD;  Location: [place];  Service: Orthopedics;  Laterality: Left;    Family History  Problem Relation Age of Onset  . Diabetes type II Mother   . Hypertension Mother   . Diabetes Other     Social History:  reports that he has been smoking cigarettes. He has a 20.00 pack-year smoking history. He has never used smokeless tobacco. He reports current alcohol use of about 12.0 standard drinks of alcohol per week. He reports that he does not use drugs.   Review of Systems   Lipid history: Treated by PCP with simvastatin, has mixed hyperlipidemia    Lab Results  Component Value Date   CHOL 134 07/14/2018   HDL 26.50 (L) 07/14/2018   LDLCALC 96 09/23/2013   LDLDIRECT 57.0 07/14/2018   TRIG 257.0 (H) 07/14/2018   CHOLHDL 5 07/14/2018           Hypertension: Has been treated with carvedilol and Entresto Followed by cardiologist   BP Readings from Last 3 Encounters:  05/09/19 122/74  10/06/18 (!) 132/96  05/30/18 102/64     Most recent foot exam: 12/19  Currently known complications of diabetes: Erectile dysfunction, minimal neuropathy, microalbuminuria Needs eye exam, currently does not have an ophthalmologist  CKD: Appears to be somewhat worse He said he had some infection recently and was given  antibiotics  Lab Results  Component Value Date   CREATININE 2.01 (H) 05/04/2019   CREATININE 1.66 (H) 01/03/2019   CREATININE 1.72 (H) 11/20/2018   HYPERKALEMIA: Resolved he was told to follow a low potassium diet  Lab Results  Component Value Date   K 5.0 05/04/2019    HYPONATREMIA: His sodium is still low but asymptomatic Not on diuretics like HCTZ Unclear why he has hyponatremia but his blood sugar was also very high on this measurement  No recent swelling of the legs  LABS:  No visits with results within 1 Week(s) from this visit.  Latest known visit with results is:  Office Visit on 05/09/2019  Component Date Value Ref Range Status  . POC Glucose 05/09/2019 173* 70 - 99 mg/dl Final    Physical Examination:  There were no vitals taken for this visit.      ASSESSMENT:  Diabetes type 2 on insulin  See history of present illness for detailed discussion of current diabetes management, blood sugar patterns and problems identified  His A1c is gone back up to 10.9  Difficult to assess his home blood sugars but also not clear why his blood sugars are markedly increased compared to his last visit in October 2020 Blood sugars are as high as 458  Today fasting blood sugar is 173 and likely has high postprandial readings with current regimen of basal insulin and Ozempic His late morning glucose was about 150 but not sure if he has consistently high fasting readings with his current dose of basal insulin He is not always consistent with diet and drinks containing simple sugar Also not doing enough exercise  HYPONATREMIA: Asymptomatic even with sodium 127 as before  Renal dysfunction:  Appears consistently impaired and will defer management to nephrologist and PCP   PLAN:     He will make sure he has an expired test strips and new prescription also sent  He needs to start checking sugars twice a day and given him target for both fasting and after meal readings on a  separate instruction sheet  Discussed that his blood sugars are much higher and with home monitoring will be able to adjust his insulin regimen better  Today discussed in detail the need for mealtime insulin to cover postprandial spikes, action of mealtime insulin, use of the insulin pen, timing and action of the rapid acting insulin as well as starting dose and dosage titration to target the two-hour reading of under 180  He will start with 10 units for his meals unless eating out or eating a large meal he will take 15 units  Also his sugars 2 hours after eating are over 180 he will go up to 15 units or more  Given him information and co-pay card for NovoLog  Also given co-pay card for [place] since he is concerned about high co-pay  He will stay with 0.5 mg Ozempic weekly but consider increasing the dose on the next visit  Start walking regularly for exercise  Avoid excessive fluids, he will need to discuss his hyponatremia also with nephrologist and cardiologist  Stop glimepiride  No change in Tresiba at this time  More regular follow-up  There are no Patient Instructions on file for this visit.    Elayne Snare 06/21/2019, 1:06 PM   Note: This office note was prepared with Dragon voice recognition system technology. Any transcriptional errors that result from this process are unintentional.

## 2019-06-22 LAB — BASIC METABOLIC PANEL
BUN: 38 mg/dL — ABNORMAL HIGH (ref 6–23)
CO2: 27 mEq/L (ref 19–32)
Calcium: 8.6 mg/dL (ref 8.4–10.5)
Chloride: 95 mEq/L — ABNORMAL LOW (ref 96–112)
Creatinine, Ser: 2.33 mg/dL — ABNORMAL HIGH (ref 0.40–1.50)
GFR: 35.93 mL/min — ABNORMAL LOW (ref >60.00)
Glucose, Bld: 288 mg/dL — ABNORMAL HIGH (ref 70–99)
Potassium: 5.8 mEq/L — ABNORMAL HIGH (ref 3.5–5.1)
Sodium: 128 mEq/L — ABNORMAL LOW (ref 135–145)

## 2019-06-22 LAB — FRUCTOSAMINE: Fructosamine: 348 umol/L — ABNORMAL HIGH (ref 0–285)

## 2019-06-28 ENCOUNTER — Ambulatory Visit: Payer: 59 | Admitting: Endocrinology

## 2019-07-20 ENCOUNTER — Other Ambulatory Visit: Payer: Self-pay

## 2019-07-20 ENCOUNTER — Other Ambulatory Visit: Payer: Self-pay | Admitting: Endocrinology

## 2019-07-20 ENCOUNTER — Other Ambulatory Visit (INDEPENDENT_AMBULATORY_CARE_PROVIDER_SITE_OTHER): Payer: 59

## 2019-07-20 DIAGNOSIS — E1165 Type 2 diabetes mellitus with hyperglycemia: Secondary | ICD-10-CM

## 2019-07-20 DIAGNOSIS — Z794 Long term (current) use of insulin: Secondary | ICD-10-CM | POA: Diagnosis not present

## 2019-07-20 LAB — COMPREHENSIVE METABOLIC PANEL
ALT: 14 U/L (ref 0–53)
AST: 14 U/L (ref 0–37)
Albumin: 3.3 g/dL — ABNORMAL LOW (ref 3.5–5.2)
Alkaline Phosphatase: 137 U/L — ABNORMAL HIGH (ref 39–117)
BUN: 43 mg/dL — ABNORMAL HIGH (ref 6–23)
CO2: 27 mEq/L (ref 19–32)
Calcium: 9.5 mg/dL (ref 8.4–10.5)
Chloride: 95 mEq/L — ABNORMAL LOW (ref 96–112)
Creatinine, Ser: 2.02 mg/dL — ABNORMAL HIGH (ref 0.40–1.50)
GFR: 42.35 mL/min — ABNORMAL LOW (ref >60.00)
Glucose, Bld: 216 mg/dL — ABNORMAL HIGH (ref 70–99)
Potassium: 4.9 mEq/L (ref 3.5–5.1)
Sodium: 130 mEq/L — ABNORMAL LOW (ref 135–145)
Total Bilirubin: 0.4 mg/dL (ref 0.2–1.2)
Total Protein: 8 g/dL (ref 6.0–8.3)

## 2019-07-20 LAB — MICROALBUMIN / CREATININE URINE RATIO
Creatinine,U: 146.2 mg/dL
Microalb Creat Ratio: 97 mg/g — ABNORMAL HIGH (ref 0.0–30.0)
Microalb, Ur: 141.7 mg/dL — ABNORMAL HIGH (ref 0.0–1.9)

## 2019-07-21 LAB — FRUCTOSAMINE: Fructosamine: 346 umol/L — ABNORMAL HIGH (ref 0–285)

## 2019-07-24 ENCOUNTER — Encounter: Payer: Self-pay | Admitting: Endocrinology

## 2019-07-24 ENCOUNTER — Encounter: Payer: 59 | Admitting: Endocrinology

## 2019-07-24 ENCOUNTER — Other Ambulatory Visit: Payer: Self-pay

## 2019-07-25 NOTE — Progress Notes (Signed)
This encounter was created in error - please disregard.

## 2019-07-31 ENCOUNTER — Encounter: Payer: Self-pay | Admitting: Endocrinology

## 2019-07-31 ENCOUNTER — Other Ambulatory Visit: Payer: Self-pay

## 2019-07-31 ENCOUNTER — Telehealth (INDEPENDENT_AMBULATORY_CARE_PROVIDER_SITE_OTHER): Payer: 59 | Admitting: Endocrinology

## 2019-07-31 DIAGNOSIS — Z794 Long term (current) use of insulin: Secondary | ICD-10-CM

## 2019-07-31 DIAGNOSIS — E871 Hypo-osmolality and hyponatremia: Secondary | ICD-10-CM

## 2019-07-31 DIAGNOSIS — E1165 Type 2 diabetes mellitus with hyperglycemia: Secondary | ICD-10-CM

## 2019-07-31 NOTE — Progress Notes (Signed)
Patient ID: Austin Alexander, male   DOB: [birth year], 49 y.o.   MRN: [id number]           Reason for Appointment: Follow-up for Type 2 Diabetes   History of Present Illness:          Date of diagnosis of type 2 diabetes mellitus:  2009      Background history:   He is not clear when he was first diagnosed with diabetes Had been previously taking metformin and also for a few years Amaryl and Actos Insulin probably started in 2015 according to records and his A1c has been as high as 15.2 in the past He thinks Trulicity was added about a year ago At some point his A1c apparently has been 7-8% but does not know when  Recent history:   His A1c is much higher at 10.9 compared to 7.3   INSULIN regimen is: [place] 90 units daily, Humalog 10-15 units irregularly   Non-insulin hypoglycemic drugs the patient is taking are: Glimepiride 4 mg daily, Ozempic 0.5 mg weekly  Current management, blood sugar patterns and problems identified:  He was asked to check his blood sugars for this visit and he has done a few readings but only before breakfast and dinnertime  Also was told to make sure his strips were not expired  He was told to take Humalog to control postprandial hyperglycemia but he is likely is not taking this with his main meal at lunch because he does not have his insulin with him  He says that he did not know that he can leave his insulin pen at room temperature  Also not checking blood sugars after dinner  Highest blood sugar in the morning was 230 today with eating some cake yesterday  He says that he is frequently eating out and does not restrict the types of meals and may eat fast food also  He drinks a lot of lemonade although he is starting to cut back and also avoiding soft drinks  Also has cut back on juices  No hypoglycemia with 90 units of Tresiba  He takes his Ozempic regularly without any nausea        Side effects from medications have been: None  Typical  meal intake: Breakfast is frequently skipped, lunch is a largest meal        Exercise:  Only some activity at work  Glucose monitoring:         Glucometer:  Accu-Chek.       Blood Glucose readings by patient review of meter   PRE-MEAL Fasting Lunch Dinner Bedtime Overall  Glucose range:  135-230   270-400    Mean/median:         Dietician visit, most recent: years ago  Weight history:  Wt Readings from Last 3 Encounters:  05/09/19 284 lb 9.6 oz (129.1 kg)  10/06/18 287 lb 6.4 oz (130.4 kg)  05/30/18 286 lb (129.7 kg)    Glycemic control:   Lab Results  Component Value Date   HGBA1C 10.9 (H) 05/04/2019   HGBA1C 7.8 (H) 01/03/2019   HGBA1C 7.3 (A) 10/06/2018   Lab Results  Component Value Date   MICROALBUR 141.7 (H) 07/20/2019   LDLCALC 96 09/23/2013   CREATININE 2.02 (H) 07/20/2019   Lab Results  Component Value Date   MICRALBCREAT 97.0 (H) 07/20/2019    Lab Results  Component Value Date   FRUCTOSAMINE 346 (H) 07/20/2019   FRUCTOSAMINE 348 (H) 06/21/2019   FRUCTOSAMINE 223 09/25/2018  No visits with results within 1 Week(s) from this visit.  Latest known visit with results is:  Lab on 07/20/2019  Component Date Value Ref Range Status  . Microalb, Ur 07/20/2019 141.7* 0.0 - 1.9 mg/dL Final  . Creatinine,U 07/20/2019 146.2  mg/dL Final  . Microalb Creat Ratio 07/20/2019 97.0* 0.0 - 30.0 mg/g Final  . Fructosamine 07/20/2019 346* 0 - 285 umol/L Final   Comment: Published reference interval for apparently healthy subjects between age 89 and 41 is 44 - 285 umol/L and in a poorly controlled diabetic population is 228 - 563 umol/L with a mean of 396 umol/L.   [name] Sodium 07/20/2019 130* 135 - 145 mEq/L Final  . Potassium 07/20/2019 4.9  3.5 - 5.1 mEq/L Final  . Chloride 07/20/2019 95* 96 - 112 mEq/L Final  . CO2 07/20/2019 27  19 - 32 mEq/L Final  . Glucose, Bld 07/20/2019 216* 70 - 99 mg/dL Final  . BUN 07/20/2019 43* 6 - 23 mg/dL Final  . Creatinine, Ser  07/20/2019 2.02* 0.40 - 1.50 mg/dL Final  . Total Bilirubin 07/20/2019 0.4  0.2 - 1.2 mg/dL Final  . Alkaline Phosphatase 07/20/2019 137* 39 - 117 U/L Final  . AST 07/20/2019 14  0 - 37 U/L Final  . ALT 07/20/2019 14  0 - 53 U/L Final  . Total Protein 07/20/2019 8.0  6.0 - 8.3 g/dL Final  . Albumin 07/20/2019 3.3* 3.5 - 5.2 g/dL Final  . GFR 07/20/2019 42.35* >60.00 mL/min Final  . Calcium 07/20/2019 9.5  8.4 - 10.5 mg/dL Final    Allergies as of 07/31/2019   No Known Allergies     Medication List       Accurate as of July 31, 2019  4:26 PM. If you have any questions, ask your nurse or doctor.        Accu-Chek FastClix Lancets Misc USE AS INSTRUCTED TO CHECK BLOOD SUGAR 2 TIMES DAILY.   Accu-Chek Guide Me w/Device Kit by Does not apply route.   Accu-Chek Guide test strip Generic drug: glucose blood Use as instructed to check blood sugar 2 times daily.   allopurinol 100 MG tablet Commonly known as: ZYLOPRIM Take 1 tablet (100 mg total) by mouth daily.   aspirin 81 MG EC tablet Take 1 tablet (81 mg total) by mouth daily.   blood glucose meter kit and supplies Dispense based on patient and insurance preference. Use up to four times daily as directed. (FOR ICD-9 250.00, 250.01).   carvedilol 25 MG tablet Commonly known as: COREG Take 25 mg by mouth 2 (two) times daily with a meal.   Digox 0.25 MG tablet Generic drug: digoxin Take 0.125 mg by mouth daily.   Entresto 49-51 MG Generic drug: sacubitril-valsartan TK 1 T PO BID   furosemide 40 MG tablet Commonly known as: LASIX Take 1 tablet (40 mg total) by mouth daily. What changed: how much to take   insulin lispro 100 UNIT/ML KwikPen Commonly known as: HumaLOG KwikPen Inject 0.1-0.15 mLs (10-15 Units total) into the skin 3 (three) times daily.   multivitamin with minerals Tabs tablet Take 1 tablet by mouth daily.   Ozempic (0.25 or 0.5 MG/DOSE) 2 MG/1.5ML Sopn Generic drug: Semaglutide(0.25 or  0.5MG/DOS) Inject 0.5 mg into the skin once a week. Take 0.25 mg weekly for the first 2 injections   sildenafil 100 MG tablet Commonly known as: VIAGRA TK 1 T PO D PRN   simvastatin 40 MG tablet Commonly known as: ZOCOR  Take 1 tablet (40 mg total) by mouth daily.   Tyler Aas FlexTouch 200 UNIT/ML FlexTouch Pen Generic drug: insulin degludec ADMINISTER 90 UNITS UNDER THE SKIN EVERY DAY       Allergies: No Known Allergies  Past Medical History:  Diagnosis Date  . 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 Abscess of perineum   . Alcohol abuse   . Asthma    History of,  . Bronchitis   . CHF (congestive heart failure) ([place])   . COPD (chronic obstructive pulmonary disease) ([facility])    moderate obstruction with low vital capacity  . Croup   . Cystic acne    with acne keloidosis  . Diabetes mellitus without complication ([facility])    Type 2, uncontrolled with questionable improvement  . Dietary noncompliance    History of,  . Dizziness   . EKG, abnormal    History of, with negative cardiac evaluation by history.  . Erectile dysfunction    Multifactorial. Improved with Cialis. New onset.  . H/O folliculitis   . Heartburn   . 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 Hyperlipidemia   . Hypertension    variable in nature  . Indigestion   . Mild obesity   . Multiple excoriations    multifactorial in origin  . Situational stress    secondary to financial issues  . Tobacco abuse     Past Surgical History:  Procedure Laterality Date  . I & D EXTREMITY Left 06/28/2014   Procedure: IRRIGATION AND DEBRIDEMENT EXTREMITY;  Surgeon: Leanora Cover, MD;  Location: [place];  Service: Orthopedics;  Laterality: Left;  . INCISION AND DRAINAGE PERIRECTAL ABSCESS N/A 02/17/2014   Procedure: IRRIGATION AND DEBRIDEMENT PERIRECTAL  AND SCROTAL ABSCESS;  Surgeon: Coralie Keens, MD;  Location: [place];  Service: General;  Laterality: N/A;  . MINOR IRRIGATION AND  DEBRIDEMENT OF WOUND Left 06/20/2015   Procedure: IRRIGATION AND DEBRIDEMENT Wound with nailbed repair;  Surgeon: Leanora Cover, MD;  Location: [place];  Service: Orthopedics;  Laterality: Left;    Family History  Problem Relation Age of Onset  . Diabetes type II Mother   . Hypertension Mother   . Diabetes Other     Social History:  reports that he has been smoking cigarettes. He has a 20.00 pack-year smoking history. He has never used smokeless tobacco. He reports current alcohol use of about 12.0 standard drinks of alcohol per week. He reports that he does not use drugs.   Review of Systems   Lipid history: Treated by PCP with simvastatin, has mixed hyperlipidemia    Lab Results  Component Value Date   CHOL 134 07/14/2018   HDL 26.50 (L) 07/14/2018   LDLCALC 96 09/23/2013   LDLDIRECT 57.0 07/14/2018   TRIG 257.0 (H) 07/14/2018   CHOLHDL 5 07/14/2018           Hypertension: Has been treated with carvedilol and Entresto Followed by cardiologist   BP Readings from Last 3 Encounters:  05/09/19 122/74  10/06/18 (!) 132/96  05/30/18 102/64     Most recent foot exam: 12/19  Currently known complications of diabetes: Erectile dysfunction, minimal neuropathy, microalbuminuria Needs eye exam, currently does not have an ophthalmologist  CKD: Appears to be somewhat worse He said he had some infection recently and was given antibiotics  Lab Results  Component Value Date   CREATININE 2.02 (H) 07/20/2019   CREATININE 2.33 (H) 06/21/2019   CREATININE  2.01 (H) 05/04/2019   HYPERKALEMIA: Not present with low potassium diet  Lab Results  Component Value Date   K 4.9 07/20/2019    HYPONATREMIA: His sodium is persistently low but asymptomatic Not on diuretics like HCTZ, only on Lasix Unclear why he has hyponatremia, usually does not have edema   LABS:  No visits with results within 1 Week(s) from this visit.  Latest known visit with results is:  Lab  on 07/20/2019  Component Date Value Ref Range Status  . Microalb, Ur 07/20/2019 141.7* 0.0 - 1.9 mg/dL Final  . Creatinine,U 07/20/2019 146.2  mg/dL Final  . Microalb Creat Ratio 07/20/2019 97.0* 0.0 - 30.0 mg/g Final  . Fructosamine 07/20/2019 346* 0 - 285 umol/L Final   Comment: Published reference interval for apparently healthy subjects between age 64 and 25 is 40 - 285 umol/L and in a poorly controlled diabetic population is 228 - 563 umol/L with a mean of 396 umol/L.   [name] Sodium 07/20/2019 130* 135 - 145 mEq/L Final  . Potassium 07/20/2019 4.9  3.5 - 5.1 mEq/L Final  . Chloride 07/20/2019 95* 96 - 112 mEq/L Final  . CO2 07/20/2019 27  19 - 32 mEq/L Final  . Glucose, Bld 07/20/2019 216* 70 - 99 mg/dL Final  . BUN 07/20/2019 43* 6 - 23 mg/dL Final  . Creatinine, Ser 07/20/2019 2.02* 0.40 - 1.50 mg/dL Final  . Total Bilirubin 07/20/2019 0.4  0.2 - 1.2 mg/dL Final  . Alkaline Phosphatase 07/20/2019 137* 39 - 117 U/L Final  . AST 07/20/2019 14  0 - 37 U/L Final  . ALT 07/20/2019 14  0 - 53 U/L Final  . Total Protein 07/20/2019 8.0  6.0 - 8.3 g/dL Final  . Albumin 07/20/2019 3.3* 3.5 - 5.2 g/dL Final  . GFR 07/20/2019 42.35* >60.00 mL/min Final  . Calcium 07/20/2019 9.5  8.4 - 10.5 mg/dL Final    Physical Examination:  There were no vitals taken for this visit.      ASSESSMENT:  Diabetes type 2 on insulin  See history of present illness for detailed discussion of current diabetes management, blood sugar patterns and problems identified  His A1c was last 10.9  Fructosamine of 346 indicates relatively better readings but still inadequate control  He is mostly not taking his lunchtime insulin coverage and this is his main meal He was concerned about not having the insulin refrigerated all the time As before he checks his blood sugars infrequently Weight has gone up Diet as discussed above can be improved also  HYPONATREMIA: Asymptomatic with persistently low  sodiums  Diabetic nephropathy: He has increased microalbumin.  Currently on Entresto  PLAN:     He will need to check blood sugars regularly especially mid afternoon or at least when he comes back from work  Also need to do some readings after dinner  We will go up to 20 units for his lunchtime Humalog coverage for now  Discussed that if his afternoon sugars are at least below 200 he can continue the same dose but adjust the dose based on what he is eating also  If he is eating a light meal at suppertime he can try 10 units Humalog for now  He can keep the Humalog in his pocket as long as he is not in extreme temperatures  Watch his diet and trying to avoid any fast food, high sugar drinks like lemonade  No change in Tresiba dose of 90 units  Ozempic 1 mg  weekly and he can start increasing the dose at home with the 0.5 device pen for now  Follow-up in 2 months and needs to bring his meter for download in the next visit  Encouraged him to start walking or other exercise program at least 20 days off  There are no Patient Instructions on file for this visit.    Elayne Snare 07/31/2019, 4:26 PM   Note: This office note was prepared with Dragon voice recognition system technology. Any transcriptional errors that result from this process are unintentional.

## 2019-08-06 ENCOUNTER — Other Ambulatory Visit: Payer: Self-pay

## 2019-08-06 MED ORDER — OZEMPIC (0.25 OR 0.5 MG/DOSE) 2 MG/1.5ML ~~LOC~~ SOPN: PEN_INJECTOR | SUBCUTANEOUS | 2 refills | Status: DC

## 2019-10-23 ENCOUNTER — Other Ambulatory Visit: Payer: Self-pay | Admitting: Endocrinology

## 2019-12-18 ENCOUNTER — Other Ambulatory Visit: Payer: Self-pay | Admitting: Endocrinology

## 2019-12-24 ENCOUNTER — Other Ambulatory Visit: Payer: Self-pay | Admitting: Endocrinology

## 2020-01-04 ENCOUNTER — Other Ambulatory Visit: Payer: Self-pay

## 2020-01-04 ENCOUNTER — Other Ambulatory Visit (INDEPENDENT_AMBULATORY_CARE_PROVIDER_SITE_OTHER): Payer: 59

## 2020-01-04 DIAGNOSIS — Z794 Long term (current) use of insulin: Secondary | ICD-10-CM

## 2020-01-04 DIAGNOSIS — E1165 Type 2 diabetes mellitus with hyperglycemia: Secondary | ICD-10-CM | POA: Diagnosis not present

## 2020-01-04 LAB — BASIC METABOLIC PANEL
BUN: 29 mg/dL — ABNORMAL HIGH (ref 6–23)
CO2: 27 mEq/L (ref 19–32)
Calcium: 9 mg/dL (ref 8.4–10.5)
Chloride: 92 mEq/L — ABNORMAL LOW (ref 96–112)
Creatinine, Ser: 1.8 mg/dL — ABNORMAL HIGH (ref 0.40–1.50)
GFR: 48.29 mL/min — ABNORMAL LOW (ref >60.00)
Glucose, Bld: 339 mg/dL — ABNORMAL HIGH (ref 70–99)
Potassium: 5.1 mEq/L (ref 3.5–5.1)
Sodium: 125 mEq/L — ABNORMAL LOW (ref 135–145)

## 2020-01-04 LAB — HEMOGLOBIN A1C: Hgb A1c MFr Bld: 11.1 % — ABNORMAL HIGH (ref 4.6–6.5)

## 2020-01-08 ENCOUNTER — Ambulatory Visit: Payer: 59 | Admitting: Endocrinology

## 2020-01-09 ENCOUNTER — Telehealth: Payer: Self-pay

## 2020-01-09 NOTE — Telephone Encounter (Signed)
New message    The patient is asking for a call back from the nurse to discuss the last office visit  & labs results

## 2020-01-09 NOTE — Telephone Encounter (Signed)
He missed his appointment, will need to discuss when he comes in and he needs to have his blood sugar meter with him

## 2020-01-09 NOTE — Telephone Encounter (Signed)
Please review lab results.

## 2020-01-10 NOTE — Telephone Encounter (Signed)
Please reschedule missed appointment

## 2020-01-10 NOTE — Telephone Encounter (Signed)
F/u    Appt scheduled on 10.7.2021

## 2020-01-17 ENCOUNTER — Encounter: Payer: Self-pay | Admitting: Endocrinology

## 2020-01-17 ENCOUNTER — Other Ambulatory Visit: Payer: Self-pay

## 2020-01-17 ENCOUNTER — Ambulatory Visit (INDEPENDENT_AMBULATORY_CARE_PROVIDER_SITE_OTHER): Payer: 59 | Admitting: Endocrinology

## 2020-01-17 VITALS — BP 162/84 | HR 84 | Ht 72.0 in | Wt 308.0 lb

## 2020-01-17 DIAGNOSIS — E1165 Type 2 diabetes mellitus with hyperglycemia: Secondary | ICD-10-CM

## 2020-01-17 DIAGNOSIS — I10 Essential (primary) hypertension: Secondary | ICD-10-CM | POA: Diagnosis not present

## 2020-01-17 DIAGNOSIS — Z23 Encounter for immunization: Secondary | ICD-10-CM | POA: Diagnosis not present

## 2020-01-17 DIAGNOSIS — E871 Hypo-osmolality and hyponatremia: Secondary | ICD-10-CM | POA: Diagnosis not present

## 2020-01-17 DIAGNOSIS — Z794 Long term (current) use of insulin: Secondary | ICD-10-CM | POA: Diagnosis not present

## 2020-01-17 LAB — BASIC METABOLIC PANEL
BUN: 31 mg/dL — ABNORMAL HIGH (ref 6–23)
CO2: 28 mEq/L (ref 19–32)
Calcium: 9.3 mg/dL (ref 8.4–10.5)
Chloride: 93 mEq/L — ABNORMAL LOW (ref 96–112)
Creatinine, Ser: 2.02 mg/dL — ABNORMAL HIGH (ref 0.40–1.50)
GFR: 36.96 mL/min — ABNORMAL LOW (ref >60.00)
Glucose, Bld: 338 mg/dL — ABNORMAL HIGH (ref 70–99)
Potassium: 5.1 mEq/L (ref 3.5–5.1)
Sodium: 127 mEq/L — ABNORMAL LOW (ref 135–145)

## 2020-01-17 LAB — TSH: TSH: 3.72 u[IU]/mL (ref 0.35–4.50)

## 2020-01-17 LAB — POCT GLUCOSE (DEVICE FOR HOME USE): POC Glucose: 347 mg/dl — AB (ref 70–99)

## 2020-01-17 LAB — T4, FREE: Free T4: 0.68 ng/dL (ref 0.60–1.60)

## 2020-01-17 MED ORDER — OZEMPIC (1 MG/DOSE) 2 MG/1.5ML ~~LOC~~ SOPN: 1.0000 mg | PEN_INJECTOR | SUBCUTANEOUS | 3 refills | Status: DC

## 2020-01-17 MED ORDER — FREESTYLE LIBRE 2 READER DEVI: 1.0000 | Freq: Once | 0 refills | Status: AC

## 2020-01-17 MED ORDER — FREESTYLE LIBRE 2 SENSOR MISC: 2.0000 | 3 refills | Status: DC

## 2020-01-17 NOTE — Patient Instructions (Addendum)
Double dose of Ozempic 0.5mg    Humalog 20-25 units before lunch and dinner  Keep after meal sugar under 200 at least  Check blood sugars on waking up 3-4  days a week  Also check blood sugars about 2 hours after meals and do this after different meals by rotation  Recommended blood sugar levels on waking up are 90-130 and about 2 hours after meal is 130-160  Please bring your blood sugar monitor to each visit, thank you

## 2020-01-17 NOTE — Progress Notes (Signed)
Patient ID: Austin Alexander, male   DOB: [birth year], 49 y.o.   MRN: [id number]           Reason for Appointment: Follow-up for Type 2 Diabetes   History of Present Illness:          Date of diagnosis of type 2 diabetes mellitus:  2009      Background history:   He is not clear when he was first diagnosed with diabetes Had been previously taking metformin and also for a few years Amaryl and Actos Insulin probably started in 2015 according to records and his A1c has been as high as 15.2 in the past He thinks Trulicity was added about a year ago At some point his A1c apparently has been 7-8% but does not know when  Recent history:   His A1c is much higher at 11.1 Last visit was 4/21  INSULIN regimen is: Tresiba 90 units daily, Humalog 10-15 units acs   Non-insulin hypoglycemic drugs the patient is taking are: , Ozempic 0.5 mg weekly  Current management, blood sugar patterns and problems identified:  He did not bring his monitor for download today and not clear how often he is monitoring  Also even though he thinks he is not eating any breakfast is on blood sugar was 330 9 in the morning on his labs last month  Today glucose is 347 after lunch with a relatively high fat meal  He was told to keep his Humalog with him in his pocket to take at lunchtime but he still does not do so  Previously has been referred to the dietitian 3 times but still did not make an appointment  He thinks that some morning sugars are as low as 100  He is snacking more in the evenings and also drinking juice periodically during the night  Also he is consuming regular lemonade during the day  Not clear what his blood sugar patterns are in the evening after dinner  He takes his Ozempic regularly although sometimes may be a little late with his injection        Side effects from medications have been: None  Typical meal intake: Breakfast is frequently skipped, lunch is a largest meal         Exercise:  none  Glucose monitoring:         Glucometer:  Accu-Chek.       Blood Glucose readings by recall  Recent range: 100-335    PRE-MEAL Fasting Lunch Dinner Bedtime Overall  Glucose range:  135-230   270-400    Mean/median:         Dietician visit, most recent: years ago  Weight history:  Wt Readings from Last 3 Encounters:  01/17/20 (!) 308 lb (139.7 kg)  05/09/19 284 lb 9.6 oz (129.1 kg)  10/06/18 287 lb 6.4 oz (130.4 kg)    Glycemic control:   Lab Results  Component Value Date   HGBA1C 11.1 (H) 01/04/2020   HGBA1C 10.9 (H) 05/04/2019   HGBA1C 7.8 (H) 01/03/2019   Lab Results  Component Value Date   MICROALBUR 141.7 (H) 07/20/2019   LDLCALC 96 09/23/2013   CREATININE 1.80 (H) 01/04/2020   Lab Results  Component Value Date   MICRALBCREAT 97.0 (H) 07/20/2019    Lab Results  Component Value Date   FRUCTOSAMINE 346 (H) 07/20/2019   FRUCTOSAMINE 348 (H) 06/21/2019   FRUCTOSAMINE 223 09/25/2018    No visits with results within 1 Week(s) from this visit.  Latest  known visit with results is:  Lab on 01/04/2020  Component Date Value Ref Range Status  . Sodium 01/04/2020 125* 135 - 145 mEq/L Final  . Potassium 01/04/2020 5.1  3.5 - 5.1 mEq/L Final  . Chloride 01/04/2020 92* 96 - 112 mEq/L Final  . CO2 01/04/2020 27  19 - 32 mEq/L Final  . Glucose, Bld 01/04/2020 339* 70 - 99 mg/dL Final  . BUN 01/04/2020 29* 6 - 23 mg/dL Final  . Creatinine, Ser 01/04/2020 1.80* 0.40 - 1.50 mg/dL Final  . GFR 01/04/2020 48.29* >60.00 mL/min Final  . Calcium 01/04/2020 9.0  8.4 - 10.5 mg/dL Final  . Hgb A1c MFr Bld 01/04/2020 11.1* 4.6 - 6.5 % Final   Glycemic Control Guidelines for People with Diabetes:Non Diabetic:  <6%Goal of Therapy: <7%Additional Action Suggested:  >8%     Allergies as of 01/17/2020   No Known Allergies     Medication List       Accurate as of January 17, 2020  1:18 PM. If you have any questions, ask your nurse or doctor.         Accu-Chek FastClix Lancets Misc USE AS INSTRUCTED TO CHECK BLOOD SUGAR 2 TIMES DAILY.   Accu-Chek Guide Me w/Device Kit by Does not apply route.   Accu-Chek Guide test strip Generic drug: glucose blood Use as instructed to check blood sugar 2 times daily.   allopurinol 100 MG tablet Commonly known as: ZYLOPRIM Take 1 tablet (100 mg total) by mouth daily.   aspirin 81 MG EC tablet Take 1 tablet (81 mg total) by mouth daily.   blood glucose meter kit and supplies Dispense based on patient and insurance preference. Use up to four times daily as directed. (FOR ICD-9 250.00, 250.01).   carvedilol 25 MG tablet Commonly known as: COREG Take 25 mg by mouth 2 (two) times daily with a meal.   Digox 0.25 MG tablet Generic drug: digoxin Take 0.125 mg by mouth daily.   Entresto 49-51 MG Generic drug: sacubitril-valsartan TK 1 T PO BID   furosemide 40 MG tablet Commonly known as: LASIX Take 1 tablet (40 mg total) by mouth daily. What changed: how much to take   insulin lispro 100 UNIT/ML KwikPen Commonly known as: HumaLOG KwikPen Inject 0.1-0.15 mLs (10-15 Units total) into the skin 3 (three) times daily.   multivitamin with minerals Tabs tablet Take 1 tablet by mouth daily.   Ozempic (0.25 or 0.5 MG/DOSE) 2 MG/1.5ML Sopn Generic drug: Semaglutide(0.25 or 0.5MG /DOS) INJECT 0.5 MG UNDER THE SKIN ONCE WEEKLY.  Please schedule follow-up   sildenafil 100 MG tablet Commonly known as: VIAGRA TK 1 T PO D PRN   simvastatin 40 MG tablet Commonly known as: ZOCOR Take 1 tablet (40 mg total) by mouth daily.   Tyler Aas FlexTouch 200 UNIT/ML FlexTouch Pen Generic drug: insulin degludec ADMINISTER 90 UNITS UNDER THE SKIN EVERY DAY       Allergies: No Known Allergies  Past Medical History:  Diagnosis Date  . 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 Abscess of perineum   . Alcohol abuse   . Asthma    History of,  . Bronchitis   . CHF (congestive heart  failure) ([facility])   . COPD (chronic obstructive pulmonary disease) (H[facility])    moderate obstruction with low vital capacity  . Croup   . Cystic acne    with acne keloidosis  . Diabetes mellitus without complication ([facility])    Type 2,  uncontrolled with questionable improvement  . Dietary noncompliance    History of,  . Dizziness   . EKG, abnormal    History of, with negative cardiac evaluation by history.  . Erectile dysfunction    Multifactorial. Improved with Cialis. New onset.  . H/O folliculitis   . Heartburn   . 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 Hyperlipidemia   . Hypertension    variable in nature  . Indigestion   . Mild obesity   . Multiple excoriations    multifactorial in origin  . Situational stress    secondary to financial issues  . Tobacco abuse     Past Surgical History:  Procedure Laterality Date  . I & D EXTREMITY Left 06/28/2014   Procedure: IRRIGATION AND DEBRIDEMENT EXTREMITY;  Surgeon: Leanora Cover, MD;  Location: [facility];  Service: Orthopedics;  Laterality: Left;  . INCISION AND DRAINAGE PERIRECTAL ABSCESS N/A 02/17/2014   Procedure: IRRIGATION AND DEBRIDEMENT PERIRECTAL  AND SCROTAL ABSCESS;  Surgeon: Coralie Keens, MD;  Location: [facility];  Service: General;  Laterality: N/A;  . MINOR IRRIGATION AND DEBRIDEMENT OF WOUND Left 06/20/2015   Procedure: IRRIGATION AND DEBRIDEMENT Wound with nailbed repair;  Surgeon: Leanora Cover, MD;  Location: [facility];  Service: Orthopedics;  Laterality: Left;    Family History  Problem Relation Age of Onset  . Diabetes type II Mother   . Hypertension Mother   . Diabetes Other     Social History:  reports that he has been smoking cigarettes. He has a 20.00 pack-year smoking history. He has never used smokeless tobacco. He reports current alcohol use of about 12.0 standard drinks of alcohol per week. He reports that he does not use drugs.   Review of Systems   Lipid history: Treated by PCP  with simvastatin, has mixed hyperlipidemia    Lab Results  Component Value Date   CHOL 134 07/14/2018   HDL 26.50 (L) 07/14/2018   LDLCALC 96 09/23/2013   LDLDIRECT 57.0 07/14/2018   TRIG 257.0 (H) 07/14/2018   CHOLHDL 5 07/14/2018           Hypertension: Has been treated with carvedilol and Entresto Followed by cardiologist Blood pressure is unusually high today, he thinks this is better with the cardiologist  BP Readings from Last 3 Encounters:  01/17/20 (!) 162/84  05/09/19 122/74  10/06/18 (!) 132/96     Most recent foot exam: 12/19  Currently known complications of diabetes: Erectile dysfunction, minimal neuropathy, microalbuminuria  CKD:   Lab Results  Component Value Date   CREATININE 1.80 (H) 01/04/2020   CREATININE 2.02 (H) 07/20/2019   CREATININE 2.33 (H) 06/21/2019   HYPERKALEMIA: Improved  Lab Results  Component Value Date   K 5.1 01/04/2020    HYPONATREMIA: His sodium is persistently low but asymptomatic again Not on diuretics like HCTZ, only on Lasix as before No recent leg edema Also has not had any recent thyroid levels  LABS:  No visits with results within 1 Week(s) from this visit.  Latest known visit with results is:  Lab on 01/04/2020  Component Date Value Ref Range Status  . Sodium 01/04/2020 125* 135 - 145 mEq/L Final  . Potassium 01/04/2020 5.1  3.5 - 5.1 mEq/L Final  . Chloride 01/04/2020 92* 96 - 112 mEq/L Final  . CO2 01/04/2020 27  19 - 32 mEq/L Final  . Glucose, Bld 01/04/2020 339* 70 - 99 mg/dL Final  . BUN 01/04/2020 29*  6 - 23 mg/dL Final  . Creatinine, Ser 01/04/2020 1.80* 0.40 - 1.50 mg/dL Final  . GFR 01/04/2020 48.29* >60.00 mL/min Final  . Calcium 01/04/2020 9.0  8.4 - 10.5 mg/dL Final  . Hgb A1c MFr Bld 01/04/2020 11.1* 4.6 - 6.5 % Final   Glycemic Control Guidelines for People with Diabetes:Non Diabetic:  <6%Goal of Therapy: <7%Additional Action Suggested:  >8%     Lab Results  Component Value Date   TSH 2.480  09/22/2013    Physical Examination:  BP (!) 162/84   Pulse 84   Ht 6' (1.829 m)   Wt (!) 308 lb (139.7 kg)   SpO2 97%   BMI 41.77 kg/m       ASSESSMENT:  Diabetes type 2 on insulin  See history of present illness for detailed discussion of current diabetes management, blood sugar patterns and problems identified  His A1c is now 11.1  He is insulin resistant but likely has more postprandial hyperglycemia As before he checks his blood sugars only randomly and probably only in the morning Weight has gone up significantly even with Ozempic 0.5 mg His intake of high fat foods and sweetened drinks and juices is still excessive  HYPONATREMIA: Etiology unclear Asymptomatic even with levels of 125 Need to rule out hypothyroidism and SIADH  Diabetic nephropathy and renal dysfunction: Renal function is slightly better  PLAN:     He will start using freestyle libre to help assess his blood sugars more completely  Discussed how this works and can help him set it up if needed  Consultation with dietitian  He likely needs to take 20-25 units of Humalog at mealtimes  He can keep the Humalog in his sugar at pocket to take at lunch  Advised him to cut back on high fat foods and sweet drinks again  No change in [place] dose of 90 units unless morning sugars come down  He will need to consider U-500 insulin or insulin pump if blood sugars are persistently high  Ozempic 1 mg weekly and he can start doing 2 shots of the 0.5 until current supply runs out  More regular follow-up  Follow-up in 6 to 8 weeks and needs to bring his meter/reader for download in the next visit  Encouraged him to start walking or other exercise regimen at least every other day  There are no Patient Instructions on file for this visit.    Elayne Snare 01/17/2020, 1:18 PM   Note: This office note was prepared with Dragon voice recognition system technology. Any transcriptional errors that result from  this process are unintentional.

## 2020-01-18 LAB — OSMOLALITY, URINE: Osmolality, Ur: 433 mOsmol/kg

## 2020-01-29 ENCOUNTER — Other Ambulatory Visit: Payer: Self-pay | Admitting: Endocrinology

## 2020-02-15 ENCOUNTER — Other Ambulatory Visit: Payer: Self-pay | Admitting: Endocrinology

## 2020-02-18 ENCOUNTER — Ambulatory Visit: Payer: 59 | Admitting: Podiatry

## 2020-02-18 ENCOUNTER — Other Ambulatory Visit: Payer: Self-pay

## 2020-02-18 DIAGNOSIS — M79609 Pain in unspecified limb: Secondary | ICD-10-CM | POA: Diagnosis not present

## 2020-02-18 DIAGNOSIS — E0842 Diabetes mellitus due to underlying condition with diabetic polyneuropathy: Secondary | ICD-10-CM

## 2020-02-18 DIAGNOSIS — M79676 Pain in unspecified toe(s): Secondary | ICD-10-CM | POA: Diagnosis not present

## 2020-02-18 DIAGNOSIS — B351 Tinea unguium: Secondary | ICD-10-CM

## 2020-02-18 DIAGNOSIS — E119 Type 2 diabetes mellitus without complications: Secondary | ICD-10-CM

## 2020-02-18 NOTE — Patient Instructions (Signed)
Diabetes Mellitus and Foot Care Foot care is an important part of your health, especially when you have diabetes. Diabetes may cause you to have problems because of poor blood flow (circulation) to your feet and legs, which can cause your skin to:  Become thinner and drier.  Break more easily.  Heal more slowly.  Peel and crack. You may also have nerve damage (neuropathy) in your legs and feet, causing decreased feeling in them. This means that you may not notice minor injuries to your feet that could lead to more serious problems. Noticing and addressing any potential problems early is the best way to prevent future foot problems. How to care for your feet Foot hygiene  Wash your feet daily with warm water and mild soap. Do not use hot water. Then, pat your feet and the areas between your toes until they are completely dry. Do not soak your feet as this can dry your skin.  Trim your toenails straight across. Do not dig under them or around the cuticle. File the edges of your nails with an emery board or nail file.  Apply a moisturizing lotion or petroleum jelly to the skin on your feet and to dry, brittle toenails. Use lotion that does not contain alcohol and is unscented. Do not apply lotion between your toes. Shoes and socks  Wear clean socks or stockings every day. Make sure they are not too tight. Do not wear knee-high stockings since they may decrease blood flow to your legs.  Wear shoes that fit properly and have enough cushioning. Always look in your shoes before you put them on to be sure there are no objects inside.  To break in new shoes, wear them for just a few hours a day. This prevents injuries on your feet. Wounds, scrapes, corns, and calluses  Check your feet daily for blisters, cuts, bruises, sores, and redness. If you cannot see the bottom of your feet, use a mirror or ask someone for help.  Do not cut corns or calluses or try to remove them with medicine.  If you  find a minor scrape, cut, or break in the skin on your feet, keep it and the skin around it clean and dry. You may clean these areas with mild soap and water. Do not clean the area with peroxide, alcohol, or iodine.  If you have a wound, scrape, corn, or callus on your foot, look at it several times a day to make sure it is healing and not infected. Check for: ? Redness, swelling, or pain. ? Fluid or blood. ? Warmth. ? Pus or a bad smell. General instructions  Do not cross your legs. This may decrease blood flow to your feet.  Do not use heating pads or hot water bottles on your feet. They may burn your skin. If you have lost feeling in your feet or legs, you may not know this is happening until it is too late.  Protect your feet from hot and cold by wearing shoes, such as at the beach or on hot pavement.  Schedule a complete foot exam at least once a year (annually) or more often if you have foot problems. If you have foot problems, report any cuts, sores, or bruises to your health care provider immediately. Contact a health care provider if:  You have a medical condition that increases your risk of infection and you have any cuts, sores, or bruises on your feet.  You have an injury that is not   healing.  You have redness on your legs or feet.  You feel burning or tingling in your legs or feet.  You have pain or cramps in your legs and feet.  Your legs or feet are numb.  Your feet always feel cold.  You have pain around a toenail. Get help right away if:  You have a wound, scrape, corn, or callus on your foot and: ? You have pain, swelling, or redness that gets worse. ? You have fluid or blood coming from the wound, scrape, corn, or callus. ? Your wound, scrape, corn, or callus feels warm to the touch. ? You have pus or a bad smell coming from the wound, scrape, corn, or callus. ? You have a fever. ? You have a red line going up your leg. Summary  Check your feet every day  for cuts, sores, red spots, swelling, and blisters.  Moisturize feet and legs daily.  Wear shoes that fit properly and have enough cushioning.  If you have foot problems, report any cuts, sores, or bruises to your health care provider immediately.  Schedule a complete foot exam at least once a year (annually) or more often if you have foot problems. This information is not intended to replace advice given to you by your health care provider. Make sure you discuss any questions you have with your health care provider. Document Revised: 12/20/2018 Document Reviewed: 04/30/2016 Elsevier Patient Education  2020 [employer]  

## 2020-02-19 ENCOUNTER — Other Ambulatory Visit (INDEPENDENT_AMBULATORY_CARE_PROVIDER_SITE_OTHER): Payer: 59

## 2020-02-19 ENCOUNTER — Other Ambulatory Visit: Payer: Self-pay

## 2020-02-19 ENCOUNTER — Telehealth: Payer: Self-pay

## 2020-02-19 DIAGNOSIS — E1165 Type 2 diabetes mellitus with hyperglycemia: Secondary | ICD-10-CM | POA: Diagnosis not present

## 2020-02-19 DIAGNOSIS — Z794 Long term (current) use of insulin: Secondary | ICD-10-CM

## 2020-02-19 LAB — BASIC METABOLIC PANEL
BUN: 25 mg/dL — ABNORMAL HIGH (ref 6–23)
CO2: 29 mEq/L (ref 19–32)
Calcium: 8.9 mg/dL (ref 8.4–10.5)
Chloride: 95 mEq/L — ABNORMAL LOW (ref 96–112)
Creatinine, Ser: 1.92 mg/dL — ABNORMAL HIGH (ref 0.40–1.50)
GFR: 39.83 mL/min — ABNORMAL LOW (ref >60.00)
Glucose, Bld: 108 mg/dL — ABNORMAL HIGH (ref 70–99)
Potassium: 4.6 mEq/L (ref 3.5–5.1)
Sodium: 128 mEq/L — ABNORMAL LOW (ref 135–145)

## 2020-02-19 LAB — LIPID PANEL
Cholesterol: 128 mg/dL (ref 0–200)
HDL: 27.2 mg/dL — ABNORMAL LOW (ref >39.00)
NonHDL: 101
Total CHOL/HDL Ratio: 5
Triglycerides: 236 mg/dL — ABNORMAL HIGH (ref 0.0–149.0)
VLDL: 47.2 mg/dL — ABNORMAL HIGH (ref 0.0–40.0)

## 2020-02-19 LAB — LDL CHOLESTEROL, DIRECT: Direct LDL: 68 mg/dL

## 2020-02-20 ENCOUNTER — Other Ambulatory Visit: Payer: 59

## 2020-02-20 LAB — FRUCTOSAMINE: Fructosamine: 217 umol/L (ref 0–285)

## 2020-02-20 NOTE — Progress Notes (Signed)
Subjective: 49 year old male presents the office today for diabetic foot evaluation.  He states he is doing well.  I have his nails trimmed today as he cannot do himself and they are somewhat thickened.  Denies any open sores.  Denies any claudication symptoms.  He has no other concerns today.  Last A1c was 11.1. Denies any systemic complaints such as fevers, chills, nausea, vomiting. No acute changes since last appointment, and no other complaints at this time.   Objective: AAO x3, NAD DP/PT pulses palpable bilaterally, CRT less than 3 seconds Protective sensation somewhat decreased with Simms Weinstein monofilament Notably hypertrophic, dystrophic with yellow-brown discoloration.  No edema, erythema or signs of infection.  No open lesions. No areas of pinpoint bony tenderness or pain with vibratory sensation. MMT 5/5, ROM WNL. No edema, erythema, increase in warmth to bilateral lower extremities.  No pain with calf compression, swelling, warmth, erythema  Assessment: Uncontrolled type 2 diabetes with neuropathy; symptomatic onychomycosis  Plan: -All treatment options discussed with the patient including all alternatives, risks, complications.  -Discussed glucose control importance of daily foot inspection.  Otherwise he seems be doing well no claudication symptoms.  Somewhat decreased in station with [place]ell Mule monofilament.  Recommend moisturizer daily to prevent a dry skin but do not apply interdigitally.   I debrided the nails C58 without complications or bleeding. -Patient encouraged to call the office with any questions, concerns, change in symptoms.   Trula Slade DPM

## 2020-02-25 ENCOUNTER — Encounter: Payer: Self-pay | Admitting: Endocrinology

## 2020-02-25 ENCOUNTER — Ambulatory Visit (INDEPENDENT_AMBULATORY_CARE_PROVIDER_SITE_OTHER): Payer: 59 | Admitting: Endocrinology

## 2020-02-25 ENCOUNTER — Other Ambulatory Visit: Payer: Self-pay

## 2020-02-25 VITALS — BP 138/86 | HR 84 | Ht 72.0 in | Wt 313.4 lb

## 2020-02-25 DIAGNOSIS — E871 Hypo-osmolality and hyponatremia: Secondary | ICD-10-CM

## 2020-02-25 DIAGNOSIS — E1165 Type 2 diabetes mellitus with hyperglycemia: Secondary | ICD-10-CM | POA: Diagnosis not present

## 2020-02-25 DIAGNOSIS — Z794 Long term (current) use of insulin: Secondary | ICD-10-CM

## 2020-02-25 NOTE — Patient Instructions (Addendum)
Take 20-25 units Humalog based on meal size  If am sugar is < 100 then reduce Tresiba to 84  Check blood sugars on waking up 4-5  days a week  Also check blood sugars about 2 hours after meals and do this after different meals by rotation  Recommended blood sugar levels on waking up are 90-130 and about 2 hours after meal is 130-160  Please bring your blood sugar monitor to each visit, thank you

## 2020-02-25 NOTE — Progress Notes (Signed)
Patient ID: Austin Alexander, male   DOB: 22-Aug-1970, 49 y.o.   MRN: [id number]           Reason for Appointment: Follow-up for Type 2 Diabetes   History of Present Illness:          Date of diagnosis of type 2 diabetes mellitus:  2009      Background history:   He is not clear when he was first diagnosed with diabetes Had been previously taking metformin and also for a few years Amaryl and Actos Insulin probably started in 2015 according to records and his A1c has been as high as 15.2 in the past He thinks Trulicity was added about a year ago At some point his A1c apparently has been 7-8% but does not know when  Recent history:   His A1c is last higher at 11.1  Fructosamine is not 217 INSULIN regimen is: [place] 90 units daily, Humalog 20-25 units acs   Non-insulin hypoglycemic drugs the patient is taking are: , Ozempic 1 mg weekly  Current management, blood sugar patterns and problems identified:  He did not bring his monitor for download again  He forgets to check his sugars in the morning before eating when he is going to work but his lab glucose was 108  He thinks he has made significant changes in his diet especially with cutting out all sugary drinks  Although he has gained a little weight this may be from his blood sugars being overall better, previously may have been over 300 at times after eating  He is taking his Humalog pen with him for covering his lunch  Also is trying to take up to 25 units coverage for meals instead of 10-15  Only once when he took his insulin and did not eat that he felt hypoglycemic  He will occasionally check his sugars at bedtime and then they may be about 160  He does have the freestyle libre but he does not know how to start it  He takes his Ozempic regularly and has done well with 1 mg dose without nausea        Side effects from medications have been: None  Typical meal intake: Breakfast is frequently skipped, lunch is a  largest meal        Exercise:  none  Glucose monitoring:         Glucometer:  Accu-Chek.       Blood Glucose readings by recall  Recent range:   160s hs   Dietician visit, most recent: years ago  Weight history:  Wt Readings from Last 3 Encounters:  02/25/20 (!) 313 lb 6.4 oz (142.2 kg)  01/17/20 (!) 308 lb (139.7 kg)  05/09/19 284 lb 9.6 oz (129.1 kg)    Glycemic control:   Lab Results  Component Value Date   HGBA1C 11.1 (H) 01/04/2020   HGBA1C 10.9 (H) 05/04/2019   HGBA1C 7.8 (H) 01/03/2019   Lab Results  Component Value Date   MICROALBUR 141.7 (H) 07/20/2019   LDLCALC 96 09/23/2013   CREATININE 1.92 (H) 02/19/2020   Lab Results  Component Value Date   MICRALBCREAT 97.0 (H) 07/20/2019    Lab Results  Component Value Date   FRUCTOSAMINE 217 02/19/2020   FRUCTOSAMINE 346 (H) 07/20/2019   FRUCTOSAMINE 348 (H) 06/21/2019    Lab on 02/19/2020  Component Date Value Ref Range Status  . Cholesterol 02/19/2020 128  0 - 200 mg/dL Final   ATP III Classification  Desirable:  < 200 mg/dL               Borderline High:  200 - 239 mg/dL          High:  > = 240 mg/dL  . Triglycerides 02/19/2020 236.0* 0 - 149 mg/dL Final   Normal:  <150 mg/dLBorderline High:  150 - 199 mg/dL  . HDL 02/19/2020 27.20* >39.00 mg/dL Final  . VLDL 02/19/2020 47.2* 0.0 - 40.0 mg/dL Final  . Total CHOL/HDL Ratio 02/19/2020 5   Final                  Men          Women1/2 Average Risk     3.4          3.3Average Risk          5.0          4.42X Average Risk          9.6          7.13X Average Risk          15.0          11.0                      . NonHDL 02/19/2020 101.00   Final   NOTE:  Non-HDL goal should be 30 mg/dL higher than patient's LDL goal (i.e. LDL goal of < 70 mg/dL, would have non-HDL goal of < 100 mg/dL)  . Sodium 02/19/2020 128* 135 - 145 mEq/L Final  . Potassium 02/19/2020 4.6  3.5 - 5.1 mEq/L Final  . Chloride 02/19/2020 95* 96 - 112 mEq/L Final  . CO2 02/19/2020 29  19  - 32 mEq/L Final  . Glucose, Bld 02/19/2020 108* 70 - 99 mg/dL Final  . BUN 02/19/2020 25* 6 - 23 mg/dL Final  . Creatinine, Ser 02/19/2020 1.92* 0.40 - 1.50 mg/dL Final  . GFR 02/19/2020 39.83* >60.00 mL/min Final   Calculated using the CKD-EPI Creatinine Equation (2021)  . Calcium 02/19/2020 8.9  8.4 - 10.5 mg/dL Final  . Fructosamine 02/19/2020 217  0 - 285 umol/L Final   Comment: Published reference interval for apparently healthy subjects between age 39 and 8 is 12 - 285 umol/L and in a poorly controlled diabetic population is 228 - 563 umol/L with a mean of 396 umol/L.   [name] Direct LDL 02/19/2020 68.0  mg/dL Final   Optimal:  <100 mg/dLNear or Above Optimal:  100-129 mg/dLBorderline High:  130-159 mg/dLHigh:  160-189 mg/dLVery High:  >190 mg/dL    Allergies as of 02/25/2020   No Known Allergies     Medication List       Accurate as of February 25, 2020 11:59 PM. If you have any questions, ask your nurse or doctor.        Accu-Chek FastClix Lancets Misc USE AS INSTRUCTED TO CHECK BLOOD SUGAR 2 TIMES DAILY.   Accu-Chek Guide Me w/Device Kit by Does not apply route.   Accu-Chek Guide test strip Generic drug: glucose blood Use as instructed to check blood sugar 2 times daily.   allopurinol 100 MG tablet Commonly known as: ZYLOPRIM Take 1 tablet (100 mg total) by mouth daily.   aspirin 81 MG EC tablet Take 1 tablet (81 mg total) by mouth daily.   blood glucose meter kit and supplies Dispense based on patient and insurance preference. Use up to four times daily as directed. (FOR ICD-9 250.00,  250.01).   carvedilol 25 MG tablet Commonly known as: COREG Take 25 mg by mouth 2 (two) times daily with a meal.   Digox 0.25 MG tablet Generic drug: digoxin Take 0.125 mg by mouth daily.   Entresto 49-51 MG Generic drug: sacubitril-valsartan TK 1 T PO BID   FreeStyle Libre 2 Sensor Misc 2 Devices by Does not apply route every 14 (fourteen) days.   furosemide 40 MG  tablet Commonly known as: LASIX Take 1 tablet (40 mg total) by mouth daily. What changed: how much to take   HumaLOG KwikPen 100 UNIT/ML KwikPen Generic drug: insulin lispro INJECT 10 TO 15 UNITS UNDER THE SKIN THREE TIMES DAILY What changed: See the new instructions.   multivitamin with minerals Tabs tablet Take 1 tablet by mouth daily.   Ozempic (1 MG/DOSE) 2 MG/1.5ML Sopn Generic drug: Semaglutide (1 MG/DOSE) Inject 1 mg into the skin once a week.   sildenafil 100 MG tablet Commonly known as: VIAGRA TK 1 T PO D PRN   simvastatin 40 MG tablet Commonly known as: ZOCOR Take 1 tablet (40 mg total) by mouth daily.   Tyler Aas FlexTouch 200 UNIT/ML FlexTouch Pen Generic drug: insulin degludec ADMINISTER 90 UNITS UNDER THE SKIN EVERY DAY       Allergies: No Known Allergies  Past Medical History:  Diagnosis Date  . 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 Abscess of perineum   . Alcohol abuse   . Asthma    History of,  . Bronchitis   . CHF (congestive heart failure) ([place])   . COPD (chronic obstructive pulmonary disease) (H[facility])    moderate obstruction with low vital capacity  . Croup   . Cystic acne    with acne keloidosis  . Diabetes mellitus without complication (H[facility])    Type 2, uncontrolled with questionable improvement  . Dietary noncompliance    History of,  . Dizziness   . EKG, abnormal    History of, with negative cardiac evaluation by history.  . Erectile dysfunction    Multifactorial. Improved with Cialis. New onset.  . H/O folliculitis   . Heartburn   . 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 Hyperlipidemia   . Hypertension    variable in nature  . Indigestion   . Mild obesity   . Multiple excoriations    multifactorial in origin  . Situational stress    secondary to financial issues  . Tobacco abuse     Past Surgical History:  Procedure Laterality Date  . I & D EXTREMITY Left 06/28/2014   Procedure:  IRRIGATION AND DEBRIDEMENT EXTREMITY;  Surgeon: Leanora Cover, MD;  Location: [facility];  Service: Orthopedics;  Laterality: Left;  . INCISION AND DRAINAGE PERIRECTAL ABSCESS N/A 02/17/2014   Procedure: IRRIGATION AND DEBRIDEMENT PERIRECTAL  AND SCROTAL ABSCESS;  Surgeon: Coralie Keens, MD;  Location: [facility];  Service: General;  Laterality: N/A;  . MINOR IRRIGATION AND DEBRIDEMENT OF WOUND Left 06/20/2015   Procedure: IRRIGATION AND DEBRIDEMENT Wound with nailbed repair;  Surgeon: Leanora Cover, MD;  Location: [facility];  Service: Orthopedics;  Laterality: Left;    Family History  Problem Relation Age of Onset  . Diabetes type II Mother   . Hypertension Mother   . Diabetes Other     Social History:  reports that he has been smoking cigarettes. He has a 20.00 pack-year smoking history. He has never used smokeless tobacco. He reports current alcohol  use of about 12.0 standard drinks of alcohol per week. He reports that he does not use drugs.   Review of Systems   Lipid history: Treated by PCP with simvastatin, has mixed hyperlipidemia    Lab Results  Component Value Date   CHOL 128 02/19/2020   HDL 27.20 (L) 02/19/2020   LDLCALC 96 09/23/2013   LDLDIRECT 68.0 02/19/2020   TRIG 236.0 (H) 02/19/2020   CHOLHDL 5 02/19/2020           Hypertension: Has been treated with carvedilol and Entresto Followed by cardiologist Blood pressure is unusually high today, he thinks this is better with the cardiologist  BP Readings from Last 3 Encounters:  02/25/20 138/86  01/17/20 (!) 162/84  05/09/19 122/74     Most recent foot exam: 12/19  Currently known complications of diabetes: Erectile dysfunction, minimal neuropathy, microalbuminuria  CKD:   Lab Results  Component Value Date   CREATININE 1.92 (H) 02/19/2020   CREATININE 2.02 (H) 01/17/2020   CREATININE 1.80 (H) 01/04/2020   HYPERKALEMIA: Improved  Lab Results  Component Value Date   K 4.6 02/19/2020     HYPONATREMIA: His sodium is persistently low but asymptomatic  Not on diuretics like HCTZ, only on Lasix as before No significant leg edema He probably does drink more water because of his frequent urination No history of thyroid disease  Lab Results  Component Value Date   NA 128 (L) 02/19/2020   K 4.6 02/19/2020   CL 95 (L) 02/19/2020   CO2 29 02/19/2020   Lab Results  Component Value Date   TSH 3.72 01/17/2020    LABS:  Lab on 02/19/2020  Component Date Value Ref Range Status  . Cholesterol 02/19/2020 128  0 - 200 mg/dL Final   ATP III Classification       Desirable:  < 200 mg/dL               Borderline High:  200 - 239 mg/dL          High:  > = 240 mg/dL  . Triglycerides 02/19/2020 236.0* 0 - 149 mg/dL Final   Normal:  <150 mg/dLBorderline High:  150 - 199 mg/dL  . HDL 02/19/2020 27.20* >39.00 mg/dL Final  . VLDL 02/19/2020 47.2* 0.0 - 40.0 mg/dL Final  . Total CHOL/HDL Ratio 02/19/2020 5   Final                  Men          Women1/2 Average Risk     3.4          3.3Average Risk          5.0          4.42X Average Risk          9.6          7.13X Average Risk          15.0          11.0                      . NonHDL 02/19/2020 101.00   Final   NOTE:  Non-HDL goal should be 30 mg/dL higher than patient's LDL goal (i.e. LDL goal of < 70 mg/dL, would have non-HDL goal of < 100 mg/dL)  . Sodium 02/19/2020 128* 135 - 145 mEq/L Final  . Potassium 02/19/2020 4.6  3.5 - 5.1 mEq/L Final  . Chloride 02/19/2020 95* 96 -  112 mEq/L Final  . CO2 02/19/2020 29  19 - 32 mEq/L Final  . Glucose, Bld 02/19/2020 108* 70 - 99 mg/dL Final  . BUN 02/19/2020 25* 6 - 23 mg/dL Final  . Creatinine, Ser 02/19/2020 1.92* 0.40 - 1.50 mg/dL Final  . GFR 02/19/2020 39.83* >60.00 mL/min Final   Calculated using the CKD-EPI Creatinine Equation (2021)  . Calcium 02/19/2020 8.9  8.4 - 10.5 mg/dL Final  . Fructosamine 02/19/2020 217  0 - 285 umol/L Final   Comment: Published reference interval for  apparently healthy subjects between age 30 and 104 is 65 - 285 umol/L and in a poorly controlled diabetic population is 228 - 563 umol/L with a mean of 396 umol/L.   [name] Direct LDL 02/19/2020 68.0  mg/dL Final   Optimal:  <100 mg/dLNear or Above Optimal:  100-129 mg/dLBorderline High:  130-159 mg/dLHigh:  160-189 mg/dLVery High:  >190 mg/dL    Lab Results  Component Value Date   TSH 3.72 01/17/2020    Physical Examination:  BP 138/86   Pulse 84   Ht 6' (1.829 m)   Wt (!) 313 lb 6.4 oz (142.2 kg)   SpO2 97%   BMI 42.50 kg/m       ASSESSMENT:  Diabetes type 2 on insulin  See history of present illness for detailed discussion of current diabetes management, blood sugar patterns and problems identified  His A1c is last 11.1  With his fructosamine at 217 he likely has much better control I have been as before he is checking blood sugar infrequently and did not bring his meter for download  Also continuing to gain weight despite increasing Ozempic to 1 mg Most of his improvement in blood sugar control is from cutting out sweet drinks and high-fat foods have been reduced  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 Sodium is slightly better, previously as low as 125 However can do better with water restriction, explained to him that he is having low sodium concentration because of excessive water retention likely Consider doing chest x-ray for screening   PLAN:     He will start using freestyle libre and he is going to be seeing the diabetes educator on Thursday for this, reminded him to check his sugars frequently during the day and evening to help establish a blood sugar pattern  Needs regular exercise  He will adjust his Humalog based on his postprandial readings and may need to reduce it to 20 units for smaller meals his morning sugars start getting below 100 he can reduce his [place] by 6 units  Reminded him to drink water only when thirsty and continue  to avoid high sugar drinks  Patient Instructions  Take 20-25 units Humalog based on meal size  If am sugar is < 100 then reduce Tresiba to 84  Check blood sugars on waking up 4-5  days a week  Also check blood sugars about 2 hours after meals and do this after different meals by rotation  Recommended blood sugar levels on waking up are 90-130 and about 2 hours after meal is 130-160  Please bring your blood sugar monitor to each visit, thank you       Elayne Snare 02/26/2020, 9:30 AM   Note: This office note was prepared with Dragon voice recognition system technology. Any transcriptional errors that result from this process are unintentional.

## 2020-02-28 ENCOUNTER — Encounter: Payer: 59 | Attending: Endocrinology | Admitting: Dietician

## 2020-02-28 ENCOUNTER — Other Ambulatory Visit: Payer: Self-pay

## 2020-02-28 ENCOUNTER — Encounter: Payer: Self-pay | Admitting: Dietician

## 2020-02-28 DIAGNOSIS — E1165 Type 2 diabetes mellitus with hyperglycemia: Secondary | ICD-10-CM

## 2020-02-28 NOTE — Progress Notes (Signed)
Diabetes Self-Management Education  Visit Type: First/Initial  Appt. Start Time: 1555 Appt. End Time: 2951  02/29/2020  [name], identified by name and date of birth, is a 49 y.o. male with a diagnosis of Diabetes: Type 2.   ASSESSMENT Patient is here today alone.  He would like learn how to use the [employer] and more about healthy eating.   He states that he recently started insulin.  Lunch is his largest meal and often high fat and high sodium).  He was not taking the insulin prior to lunch as he thought that the insulin needed to be refrigerated.  He is now taking this and has noted improvements in his blood sugar. Reviewed that he needs to be careful when weather is hot and to avoid keeping this in a hot car.  Provided him a brochure on the [facility] Case.  History includes Type 2 diabetes (2009), CKD, smoking Labs noted:  GFR 39 02/19/2020, HgbA1C 11.1% 01/04/2020, Fructosamine 217 02/19/2020 indicating improved control.  Medications include:  Humalog 12 units before meals and only takes bid as he skips breakfast., Tresiba 96 daily at 6 am, Ozempic  Patient brought the [employer] 2 with him.  Set up reader.  Instructed patient on use and patient was able to put this on.  Discussed problem solving if it does not stick.  Patient lives alone.  He does his own shopping and cooking.  He works for the [employer] in [employer].  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  He does walk some on his job.  Height 6' (1.829 m), weight (!) 311 lb (141.1 kg). Body mass index is 42.18 kg/m.   Diabetes Self-Management Education - 02/28/20 1612      Visit Information   Visit Type First/Initial      Initial Visit   Diabetes Type Type 2    Are you currently following a meal plan? No    Are you taking your medications as prescribed? Yes    Date Diagnosed 2009      Psychosocial Assessment   Patient Belief/Attitude about Diabetes Motivated to manage diabetes    Self-care  barriers None    Self-management support Doctor's office    Other persons present Patient    Patient Concerns Nutrition/Meal planning;Weight Control;Glycemic Control;Healthy Lifestyle    Special Needs None    Preferred Learning Style No preference indicated    Learning Readiness Ready    How often do you need to have someone help you when you read instructions, pamphlets, or other written materials from your doctor or pharmacy? 1 - Never    What is the last grade level you completed in school? 12      Pre-Education Assessment   Patient understands the diabetes disease and treatment process. Needs Review    Patient understands incorporating nutritional management into lifestyle. Needs Review    Patient undertands incorporating physical activity into lifestyle. Needs Review    Patient understands using medications safely. Needs Review    Patient understands monitoring blood glucose, interpreting and using results Needs Review    Patient understands prevention, detection, and treatment of acute complications. Needs Review    Patient understands prevention, detection, and treatment of chronic complications. Needs Review    Patient understands how to develop strategies to address psychosocial issues. Needs Review    Patient understands how to develop strategies to promote health/change behavior. Needs Review      Complications   Last HgB A1C  per patient/outside source 11.1 %   01/04/2020   How often do you check your blood sugar? 0 times/day (not testing)    Number of hypoglycemic episodes per month 1    Can you tell when your blood sugar is low? Yes    What do you do if your blood sugar is low? eats icies    Number of hyperglycemic episodes per week 14    Can you tell when your blood sugar is high? Yes    What do you do if your blood sugar is high? take insulin, drink water    Have you had a dilated eye exam in the past 12 months? Yes    Have you had a dental exam in the past 12 months? Yes     Are you checking your feet? Yes    How many days per week are you checking your feet? 7      Dietary Intake   Breakfast coffee with splenda and creamer    Snack (morning) chips    Lunch lemon pepper wings, ranch dressing OR phili steak and cheese OR 2 hot dogs and fries OR [employer] OR soul food vegetable plate   62:13-08:65   Dinner sandwich OR ribs or chicken, vegetables, occasional rice    Snack (evening) ice cream    Beverage(s) water, sugar free twist (2-3 daily), coffee with splenda adn 1T liquid creamer      Exercise   Exercise Type Light (walking / raking leaves)   related to his job   How many days per week to you exercise? 6    How many minutes per day do you exercise? 30    Total minutes per week of exercise 180      Patient Education   Previous Diabetes Education Yes (please comment)   2017 with Austin Alexander, [place].  Patient was not ready to make changes at that time.   Nutrition management  Meal options for control of blood glucose level and chronic complications.;Role of diet in the treatment of diabetes and the relationship between the three main macronutrients and blood glucose level    Physical activity and exercise  Role of exercise on diabetes management, blood pressure control and cardiac health.    Medications Reviewed patients medication for diabetes, action, purpose, timing of dose and side effects.    Monitoring Taught/evaluated SMBG meter.;Identified appropriate SMBG and/or A1C goals.;Daily foot exams;Taught/discussed recording of test results and interpretation of SMBG.    Acute complications Taught treatment of hypoglycemia - the 15 rule.;Discussed and identified patients' treatment of hyperglycemia.    Chronic complications Relationship between chronic complications and blood glucose control    Psychosocial adjustment Worked with patient to identify barriers to care and solutions    Personal strategies to promote health Review risk of smoking and offered smoking cessation        Individualized Goals (developed by patient)   Nutrition General guidelines for healthy choices and portions discussed    Physical Activity Exercise 5-7 days per week;30 minutes per day    Medications take my medication as prescribed    Monitoring  test my blood glucose as discussed    Reducing Risk increase portions of healthy fats;examine blood glucose patterns    Health Coping discuss diabetes with (comment)      Post-Education Assessment   Patient understands the diabetes disease and treatment process. Demonstrates understanding / competency    Patient understands incorporating nutritional management into lifestyle. Needs Review    Patient undertands  incorporating physical activity into lifestyle. Demonstrates understanding / competency    Patient understands using medications safely. Demonstrates understanding / competency    Patient understands monitoring blood glucose, interpreting and using results Demonstrates understanding / competency    Patient understands prevention, detection, and treatment of acute complications. Demonstrates understanding / competency    Patient understands prevention, detection, and treatment of chronic complications. Demonstrates understanding / competency    Patient understands how to develop strategies to address psychosocial issues. Needs Review    Patient understands how to develop strategies to promote health/change behavior. Needs Review      Outcomes   Expected Outcomes Demonstrated interest in learning. Expect positive outcomes    Future DMSE 2 months    Program Status Completed           Individualized Plan for Diabetes Self-Management Training:   Learning Objective:  Patient will have a greater understanding of diabetes self-management. Patient education plan is to attend individual and/or group sessions per assessed needs and concerns.   Plan:   Patient Instructions  Stop smoking.  Start by only smoking outside (not in house or in  car). Consider a FreeStyle Libre Reader case found on line.  Consider a fat free creamer for your coffee  You can take 20-25 units of Humalog before meals per Dr. Dwyane Dee.  Blood sugar goals:  80-130 fasting (morning or when you haven't eaten in 5 hours)  Less than 180 two hours after starting a meal.  The blood sugar should rise only about 40-60 points with a meal.  You can check your blood sugar before and 2 hours after the meal to see how much your blood sugar rises with food.     Reduce salt  Avoid Processed meats, hot dogs - choose low sodium [place]  Choose restaurants that use less salt  Avoid added salt  Continue to avoid pork  You are doing a great job taking your medication now.     Expected Outcomes:  Demonstrated interest in learning. Expect positive outcomes  Education material provided: ADA - How to Thrive: A Guide for Your Journey with Diabetes, My Plate and Snack sheet  If problems or questions, patient to contact team via:  Phone  Future DSME appointment: 2 months

## 2020-02-28 NOTE — Patient Instructions (Addendum)
Stop smoking.  Start by only smoking outside (not in house or in car). Consider a FreeStyle Libre Reader case found on line.  Consider a fat free creamer for your coffee  You can take 20-25 units of Humalog before meals per Dr. Dwyane Dee.  Blood sugar goals:  80-130 fasting (morning or when you haven't eaten in 5 hours)  Less than 180 two hours after starting a meal.  The blood sugar should rise only about 40-60 points with a meal.  You can check your blood sugar before and 2 hours after the meal to see how much your blood sugar rises with food.     Reduce salt  Avoid Processed meats, hot dogs - choose low sodium [place]  Choose restaurants that use less salt  Avoid added salt  Continue to avoid pork  You are doing a great job taking your medication now.

## 2020-04-02 ENCOUNTER — Other Ambulatory Visit: Payer: 59

## 2020-04-14 ENCOUNTER — Other Ambulatory Visit: Payer: 59

## 2020-04-14 ENCOUNTER — Other Ambulatory Visit (INDEPENDENT_AMBULATORY_CARE_PROVIDER_SITE_OTHER): Payer: 59

## 2020-04-14 ENCOUNTER — Other Ambulatory Visit: Payer: Self-pay

## 2020-04-14 DIAGNOSIS — Z794 Long term (current) use of insulin: Secondary | ICD-10-CM

## 2020-04-14 DIAGNOSIS — E1165 Type 2 diabetes mellitus with hyperglycemia: Secondary | ICD-10-CM | POA: Diagnosis not present

## 2020-04-14 DIAGNOSIS — E871 Hypo-osmolality and hyponatremia: Secondary | ICD-10-CM

## 2020-04-14 LAB — BASIC METABOLIC PANEL
BUN: 25 mg/dL — ABNORMAL HIGH (ref 6–23)
CO2: 31 mEq/L (ref 19–32)
Calcium: 9.6 mg/dL (ref 8.4–10.5)
Chloride: 94 mEq/L — ABNORMAL LOW (ref 96–112)
Creatinine, Ser: 1.73 mg/dL — ABNORMAL HIGH (ref 0.40–1.50)
GFR: 45.08 mL/min — ABNORMAL LOW (ref >60.00)
Glucose, Bld: 93 mg/dL (ref 70–99)
Potassium: 4.8 mEq/L (ref 3.5–5.1)
Sodium: 129 mEq/L — ABNORMAL LOW (ref 135–145)

## 2020-04-14 LAB — HEMOGLOBIN A1C: Hgb A1c MFr Bld: 7.5 % — ABNORMAL HIGH (ref 4.6–6.5)

## 2020-04-15 ENCOUNTER — Ambulatory Visit: Payer: 59 | Admitting: Endocrinology

## 2020-04-17 ENCOUNTER — Other Ambulatory Visit: Payer: Self-pay

## 2020-04-17 ENCOUNTER — Encounter: Payer: 59 | Admitting: Endocrinology

## 2020-04-17 ENCOUNTER — Encounter: Payer: Self-pay | Admitting: Endocrinology

## 2020-04-17 ENCOUNTER — Encounter: Payer: 59 | Admitting: Dietician

## 2020-04-17 VITALS — BP 138/88 | HR 86 | Ht 72.0 in | Wt 314.6 lb

## 2020-04-17 LAB — GLUCOSE, POCT (MANUAL RESULT ENTRY): POC Glucose: 97 mg/dl (ref 70–99)

## 2020-04-17 NOTE — Progress Notes (Signed)
This encounter was created in error - please disregard.

## 2020-04-30 NOTE — Progress Notes (Signed)
Patient ID: Austin Alexander, male   DOB: [birth year], 50 y.o.   MRN: [id number]           Reason for Appointment: Follow-up for Type 2 Diabetes   History of Present Illness:          Date of diagnosis of type 2 diabetes mellitus:  2009      Background history:   He is not clear when he was first diagnosed with diabetes Had been previously taking metformin and also for a few years Amaryl and Actos Insulin probably started in 2015 according to records and his A1c has been as high as 15.2 in the past He thinks Trulicity was added about a year ago At some point his A1c apparently has been 7-8% but does not know when  Recent history:    Fructosamine is .217  INSULIN regimen is: Tresiba 90 units daily, Humalog 20-25 units acs   Non-insulin hypoglycemic drugs the patient is taking are: , Ozempic 1 mg weekly  His A1c is significantly better at 7.5  Current management, blood sugar patterns and problems identified:  Blood sugars were assessed by his freestyle download which is analyzed and reported as below  This has been started since his last visit and he is monitoring blood sugars regularly at various times  He takes his Ozempic every week without missing any doses  As discussed below is having some variability in his blood sugars are not showing any consistent pattern of high sugars except usually late mornings already related to drinking coffee from [employer]  He has increased his Humalog at lunch and dinner and usually not having high readings after meals except on occasion  Yesterday afternoon he was very active and blood sugars were low normal around 4-5 PM and also relatively lower overnight  He has not compared his libre with his ACCU-CHEK fingersticks        Side effects from medications have been: None  Typical meal intake: Breakfast is frequently skipped, lunch is a largest meal        Exercise:  none  Analysis of freestyle libre download:   His blood sugars  are showing some variability but generally stable with mildly high blood sugars throughout the day and night  HYPERGLYCEMIA is most frequently morning although inconsistent and can have readings as high as 286.  In the last week he has occasionally had persistently high readings otherwise he may have either high readings late morning or after dinner  Highest blood sugar 319 after lunch on 1/10  Hypoglycemia has been minimal, not clear if his blood sugar of 53 this morning was accurate  LOWEST blood sugar on an average average are early morning  Preprandial readings are mildly increased on an average she had lunch and dinner about 170 average  POSTPRANDIAL blood sugars may be occasionally higher after breakfast or dinner but not much above baseline  CGM use % of time 82  2-week average/GV 168  Time in range      64  %  % Time Above 180 33  % Time above 250 3  % Time Below 70      PRE-MEAL Fasting Lunch Dinner Bedtime Overall  Glucose range:       Averages:  144  161  177  161  168   POST-MEAL PC Breakfast PC Lunch PC Dinner  Glucose range:     Averages:  198  159  183     Dietician visit, most recent: years ago  Weight history:  Wt Readings from Last 3 Encounters:  05/01/20 (!) 317 lb 12.8 oz (144.2 kg)  04/17/20 (!) 314 lb 9.6 oz (142.7 kg)  02/28/20 (!) 311 lb (141.1 kg)    Glycemic control:   Lab Results  Component Value Date   HGBA1C 7.5 (H) 04/14/2020   HGBA1C 11.1 (H) 01/04/2020   HGBA1C 10.9 (H) 05/04/2019   Lab Results  Component Value Date   MICROALBUR 141.7 (H) 07/20/2019   LDLCALC 96 09/23/2013   CREATININE 1.73 (H) 04/14/2020   Lab Results  Component Value Date   MICRALBCREAT 97.0 (H) 07/20/2019    Lab Results  Component Value Date   FRUCTOSAMINE 217 02/19/2020   FRUCTOSAMINE 346 (H) 07/20/2019   FRUCTOSAMINE 348 (H) 06/21/2019    No visits with results within 1 Week(s) from this visit.  Latest known visit with results is:  Erroneous  Encounter on 04/17/2020  Component Date Value Ref Range Status  . POC Glucose 04/17/2020 97  70 - 99 mg/dl Final    Allergies as of 05/01/2020   No Known Allergies     Medication List       Accurate as of May 01, 2020 11:23 AM. If you have any questions, ask your nurse or doctor.        Accu-Chek FastClix Lancets Misc USE AS INSTRUCTED TO CHECK BLOOD SUGAR 2 TIMES DAILY.   Accu-Chek Guide Me w/Device Kit by Does not apply route.   Accu-Chek Guide test strip Generic drug: glucose blood Use as instructed to check blood sugar 2 times daily.   allopurinol 100 MG tablet Commonly known as: ZYLOPRIM Take 1 tablet (100 mg total) by mouth daily.   aspirin 81 MG EC tablet Take 1 tablet (81 mg total) by mouth daily.   blood glucose meter kit and supplies Dispense based on patient and insurance preference. Use up to four times daily as directed. (FOR ICD-9 250.00, 250.01).   carvedilol 25 MG tablet Commonly known as: COREG Take 25 mg by mouth 2 (two) times daily with a meal.   Digox 0.25 MG tablet Generic drug: digoxin Take 0.125 mg by mouth daily.   Entresto 49-51 MG Generic drug: sacubitril-valsartan TK 1 T PO BID   FreeStyle Libre 2 Sensor Misc 2 Devices by Does not apply route every 14 (fourteen) days.   furosemide 40 MG tablet Commonly known as: LASIX Take 1 tablet (40 mg total) by mouth daily. What changed: how much to take   HumaLOG KwikPen 100 UNIT/ML KwikPen Generic drug: insulin lispro INJECT 10 TO 15 UNITS UNDER THE SKIN THREE TIMES DAILY What changed: See the new instructions.   multivitamin with minerals Tabs tablet Take 1 tablet by mouth daily.   Ozempic (1 MG/DOSE) 2 MG/1.5ML Sopn Generic drug: Semaglutide (1 MG/DOSE) Inject 1 mg into the skin once a week.   sildenafil 100 MG tablet Commonly known as: VIAGRA TK 1 T PO D PRN   simvastatin 40 MG tablet Commonly known as: ZOCOR Take 1 tablet (40 mg total) by mouth daily.   Tyler Aas  FlexTouch 200 UNIT/ML FlexTouch Pen Generic drug: insulin degludec ADMINISTER 90 UNITS UNDER THE SKIN EVERY DAY       Allergies: No Known Allergies  Past Medical History:  Diagnosis Date  . 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 Abscess of perineum   . Alcohol abuse   . Asthma    History of,  . Bronchitis   . CHF (congestive  heart failure) ([place])   . COPD (chronic obstructive pulmonary disease) (HCC)    moderate obstruction with low vital capacity  . Croup   . Cystic acne    with acne keloidosis  . Diabetes mellitus without complication ([facility])    Type 2, uncontrolled with questionable improvement  . Dietary noncompliance    History of,  . Dizziness   . EKG, abnormal    History of, with negative cardiac evaluation by history.  . Erectile dysfunction    Multifactorial. Improved with Cialis. New onset.  . H/O folliculitis   . Heartburn   . 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 Hyperlipidemia   . Hypertension    variable in nature  . Indigestion   . Mild obesity   . Multiple excoriations    multifactorial in origin  . Situational stress    secondary to financial issues  . Tobacco abuse     Past Surgical History:  Procedure Laterality Date  . I & D EXTREMITY Left 06/28/2014   Procedure: IRRIGATION AND DEBRIDEMENT EXTREMITY;  Surgeon: Leanora Cover, MD;  Location: [facility];  Service: Orthopedics;  Laterality: Left;  . INCISION AND DRAINAGE PERIRECTAL ABSCESS N/A 02/17/2014   Procedure: IRRIGATION AND DEBRIDEMENT PERIRECTAL  AND SCROTAL ABSCESS;  Surgeon: Coralie Keens, MD;  Location: [facility];  Service: General;  Laterality: N/A;  . MINOR IRRIGATION AND DEBRIDEMENT OF WOUND Left 06/20/2015   Procedure: IRRIGATION AND DEBRIDEMENT Wound with nailbed repair;  Surgeon: Leanora Cover, MD;  Location: [facility];  Service: Orthopedics;  Laterality: Left;    Family History  Problem Relation Age of Onset  . Diabetes type II Mother    . Hypertension Mother   . Diabetes Other     Social History:  reports that he has been smoking cigarettes. He has a 20.00 pack-year smoking history. He has never used smokeless tobacco. He reports current alcohol use of about 12.0 standard drinks of alcohol per week. He reports that he does not use drugs.   Review of Systems   Lipid history: Treated by PCP with simvastatin, has mixed hyperlipidemia    Lab Results  Component Value Date   CHOL 128 02/19/2020   HDL 27.20 (L) 02/19/2020   LDLCALC 96 09/23/2013   LDLDIRECT 68.0 02/19/2020   TRIG 236.0 (H) 02/19/2020   CHOLHDL 5 02/19/2020           Hypertension: Has been treated with carvedilol and Entresto Followed by cardiologist Blood pressure is not consistently controlled  BP Readings from Last 3 Encounters:  05/01/20 136/88  04/17/20 138/88  02/25/20 138/86     Most recent foot exam: 12/19  Currently known complications of diabetes: Erectile dysfunction, minimal neuropathy, microalbuminuria  CKD: Not followed by nephrologist, labs as follows  Lab Results  Component Value Date   CREATININE 1.73 (H) 04/14/2020   CREATININE 1.92 (H) 02/19/2020   CREATININE 2.02 (H) 01/17/2020   HYPERKALEMIA: Improved  Lab Results  Component Value Date   K 4.8 04/14/2020    HYPONATREMIA: His sodium is persistently low but asymptomatic  Not on diuretics like HCTZ, only on Lasix daily No recent leg edema Urine osmolality on 01/17/2020 was high at 433  Has not tried to cut down on fluid intake overall as discussed  No history of thyroid disease  Lab Results  Component Value Date   NA 129 (L) 04/14/2020   K 4.8 04/14/2020   CL 94 (L) 04/14/2020   CO2 31  04/14/2020   Lab Results  Component Value Date   TSH 3.72 01/17/2020    LABS:  No visits with results within 1 Week(s) from this visit.  Latest known visit with results is:  Erroneous Encounter on 04/17/2020  Component Date Value Ref Range Status  . POC Glucose  04/17/2020 97  70 - 99 mg/dl Final    Lab Results  Component Value Date   TSH 3.72 01/17/2020    Physical Examination:  BP 136/88   Pulse 87   Ht 6' (1.829 m)   Wt (!) 317 lb 12.8 oz (144.2 kg)   SpO2 96%   BMI 43.10 kg/m       ASSESSMENT:  Diabetes type 2 on insulin  See history of present illness for detailed discussion of current diabetes management, blood sugar patterns and problems identified  His A1c is much better at 7.5 compared to 11.1 previously  He is doing much better with making sure he is taking Humalog with him for meals and also assessing his blood sugars with the freestyle libre Although he has not achieved any weight loss his difficulty may be related to overall improved blood sugars However he is going to continue to work with the dietitian Can also do better with exercise  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  Sodium is relatively stable now at 129, previously as low as 125 Not clear if he is restricting water and fluids as directed   PLAN:     He will start taking Humalog in the mornings also even when not eating breakfast, likely needs at least 8 units when he drinks his 2 cups of coffee  Will otherwise still take the same doses for full meals  Reduce Humalog by 5 units when planning to be very active after any given meal  Otherwise continue same doses at lunch and dinner  If his fasting blood sugars are consistently high may consider increasing Tyler Aas  Discussed needing to check fingerstick Accu-Chek readings periodically especially when blood sugars are unusually high or low  No change in Ozempic or [place]; discussed that he can take his [place] with his dinner Humalog also  He will try to cut back on fluid intake overall and only have fluids when he is thirsty for his low sodium  Given a list of new PCPs to establish with; likely needs to be seen by nephrologist also  Follow-up with dietitian  today Regular exercise  Patient Instructions  Humalog 8 units in am for coffee  Reduce by 5 units when going to be active  Check Accucheck sugar also at times  Check blood sugars on waking up 7 days a week  Also check blood sugars about 2 hours after meals and do this after different meals by rotation  Recommended blood sugar levels on waking up are 90-130 and about 2 hours after meal is 130-160  Please bring your blood sugar monitor to each visit, thank you       Elayne Snare 05/01/2020, 11:23 AM   Note: This office note was prepared with Dragon voice recognition system technology. Any transcriptional errors that result from this process are unintentional.

## 2020-05-01 ENCOUNTER — Encounter: Payer: Self-pay | Admitting: Dietician

## 2020-05-01 ENCOUNTER — Encounter: Payer: Self-pay | Admitting: Endocrinology

## 2020-05-01 ENCOUNTER — Encounter: Payer: 59 | Attending: Endocrinology | Admitting: Dietician

## 2020-05-01 ENCOUNTER — Ambulatory Visit (INDEPENDENT_AMBULATORY_CARE_PROVIDER_SITE_OTHER): Payer: 59 | Admitting: Endocrinology

## 2020-05-01 ENCOUNTER — Other Ambulatory Visit: Payer: Self-pay

## 2020-05-01 VITALS — BP 136/88 | HR 87 | Ht 72.0 in | Wt 317.8 lb

## 2020-05-01 DIAGNOSIS — E871 Hypo-osmolality and hyponatremia: Secondary | ICD-10-CM

## 2020-05-01 DIAGNOSIS — E1165 Type 2 diabetes mellitus with hyperglycemia: Secondary | ICD-10-CM | POA: Insufficient documentation

## 2020-05-01 DIAGNOSIS — Z794 Long term (current) use of insulin: Secondary | ICD-10-CM

## 2020-05-01 NOTE — Progress Notes (Signed)
Diabetes Self-Management Education  Visit Type:  Follow-up  Appt. Start Time: 1143  Appt. End Time: [id number]  05/01/2020  [name], identified by name and date of birth, is a 50 y.o. male with a diagnosis of Diabetes: Type 2.   ASSESSMENT Taking his insulin consistently now that he knows that his insulin does not need to be refrigerated.  He is monitoring his glucose with the [employer].  Reading currently 139. He had a low of 59 on the libre this am but did not have symptoms.  He treated with 1/2 cup OJ. Discussed to double check with a meter prior to treating in the future.  He has been walking more at work.  He is using sugar in his coffee rather than Splenda as he does not like the taste.  He likes Equal but cannot find it.  Weight increased from 311 lbs to 317 lbs. A1C decreased from 11.1% 01/04/2020 to 7.5% 04/15/2019   History includes Type 2 diabetes (2009), CKD, smoking Labs noted:  GFR 39 02/19/2020, HgbA1C 11.1% 01/04/2020, Fructosamine 217 02/19/2020 indicating improved control.  Medications include:  Humalog 20 units before meals and only takes bid as he skips breakfast., Tyler Aas 90 daily at 6 am, Ozempic  Patient lives alone.  He does his own shopping and cooking.  He works for the [employer] in [employer].  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  He does walk some on his job.     Diabetes Self-Management Education - 05/01/20 1256      Psychosocial Assessment   Patient Belief/Attitude about Diabetes Motivated to manage diabetes    Self-care barriers None    Self-management support Doctor's office;CDE visits    Patient Concerns Nutrition/Meal planning    Special Needs None    Preferred Learning Style No preference indicated    Learning Readiness Ready      Pre-Education Assessment   Patient understands the diabetes disease and treatment process. Demonstrates understanding / competency    Patient understands incorporating nutritional  management into lifestyle. Needs Review    Patient undertands incorporating physical activity into lifestyle. Demonstrates understanding / competency    Patient understands using medications safely. Demonstrates understanding / competency    Patient understands monitoring blood glucose, interpreting and using results Needs Review    Patient understands prevention, detection, and treatment of acute complications. Demonstrates understanding / competency    Patient understands prevention, detection, and treatment of chronic complications. Demonstrates understanding / competency    Patient understands how to develop strategies to address psychosocial issues. Demonstrates understanding / competency    Patient understands how to develop strategies to promote health/change behavior. Needs Review      Complications   Last HgB A1C per patient/outside source 7.5 %   04/15/2019 decreased from 11.1% 01/03/21   How often do you check your blood sugar? > 4 times/day    Can you tell when your blood sugar is low? Yes    What do you do if your blood sugar is low? 1/2 cup OJ      Dietary Intake   Breakfast Coffee with sugar and almond milk    Lunch Fast food    Dinner Ribs, wild rice, peas and carrots    Beverage(s) water, coffee with sugar, sugar free twist      Exercise   Exercise Type Light (walking / raking leaves)    How many days per week to you exercise? 6    How many  minutes per day do you exercise? 30    Total minutes per week of exercise 180      Patient Education   Previous Diabetes Education Yes (please comment)   02/2020   Nutrition management  Food label reading, portion sizes and measuring food.;Information on hints to eating out and maintain blood glucose control.    Monitoring Identified appropriate SMBG and/or A1C goals.      Individualized Goals (developed by patient)   Nutrition General guidelines for healthy choices and portions discussed    Physical Activity Exercise 5-7 days per  week;30 minutes per day    Medications take my medication as prescribed    Monitoring  test my blood glucose as discussed    Reducing Risk increase portions of healthy fats;stop smoking;examine blood glucose patterns      Patient Self-Evaluation of Goals - Patient rates self as meeting previously set goals (% of time)   Nutrition >75%    Physical Activity >75%    Medications >75%    Monitoring >75%    Problem Solving >75%    Reducing Risk 50 - 75 %    Health Coping >75%      Post-Education Assessment   Patient understands the diabetes disease and treatment process. Demonstrates understanding / competency    Patient understands incorporating nutritional management into lifestyle. Needs Review    Patient undertands incorporating physical activity into lifestyle. Demonstrates understanding / competency    Patient understands using medications safely. Demonstrates understanding / competency    Patient understands monitoring blood glucose, interpreting and using results Demonstrates understanding / competency    Patient understands prevention, detection, and treatment of acute complications. Demonstrates understanding / competency    Patient understands prevention, detection, and treatment of chronic complications. Demonstrates understanding / competency    Patient understands how to develop strategies to address psychosocial issues. Demonstrates understanding / competency    Patient understands how to develop strategies to promote health/change behavior. Needs Review      Outcomes   Program Status Not Completed      Subsequent Visit   Since your last visit have you continued or begun to take your medications as prescribed? Yes    Since your last visit have you experienced any weight changes? Gain    Weight Gain (lbs) 6    Since your last visit, are you checking your blood glucose at least once a day? Yes           Learning Objective:  Patient will have a greater understanding of  diabetes self-management. Patient education plan is to attend individual and/or group sessions per assessed needs and concerns.   Plan:   Patient Instructions  Consider using Equal in your coffee rather than sugar Be mindful of your portion of butter  Continue to use pam.  Small amounts of olive oil  Great job on walking.  Continue to walk most days. Download a free pedometer app or see if one is already on your phone.  Continue using the [employer].  If this shows a low, double check on your meter.   Blood sugar goals:             80-130 fasting (morning or when you haven't eaten in 5 hours)             Less than 180 two hours after starting a meal.             The blood sugar should rise only about 40-60 points  with a meal.  You can check your blood sugar before and 2 hours after the meal to see how much your blood sugar rises with food.     Reduce salt  Limit added salt             Avoid Processed meats, hot dogs - choose low sodium [place]             Choose restaurants that use less salt             Avoid added salt             Continue to avoid pork  1/2 your plate should be vegetables     Consider using the [facility] case or cooler for your insulin if needed in the summer to keep your insulin cooled.     Expected Outcomes:  Demonstrated interest in learning. Expect positive outcomes  Education material provided: Diabetes Healthy Tips for Eating Out  If problems or questions, patient to contact team via:  Phone  Future DSME appointment: - 3-4 months

## 2020-05-01 NOTE — Patient Instructions (Addendum)
Consider using Equal in your coffee rather than sugar Be mindful of your portion of butter  Continue to use pam.  Small amounts of olive oil  Great job on walking.  Continue to walk most days. Download a free pedometer app or see if one is already on your phone.  Continue using the [employer].  If this shows a low, double check on your meter.   Blood sugar goals:             80-130 fasting (morning or when you haven't eaten in 5 hours)             Less than 180 two hours after starting a meal.             The blood sugar should rise only about 40-60 points with a meal.  You can check your blood sugar before and 2 hours after the meal to see how much your blood sugar rises with food.     Reduce salt  Limit added salt             Avoid Processed meats, hot dogs - choose low sodium [place]             Choose restaurants that use less salt             Avoid added salt             Continue to avoid pork  1/2 your plate should be vegetables     Consider using the [facility] case or cooler for your insulin if needed in the summer to keep your insulin cooled.

## 2020-05-01 NOTE — Patient Instructions (Addendum)
Humalog 8 units in am for coffee  Reduce by 5 units when going to be active  Check Accucheck sugar also at times  Check blood sugars on waking up 7 days a week  Also check blood sugars about 2 hours after meals and do this after different meals by rotation  Recommended blood sugar levels on waking up are 90-130 and about 2 hours after meal is 130-160  Please bring your blood sugar monitor to each visit, thank you  Minimize fluid intake

## 2020-05-23 ENCOUNTER — Other Ambulatory Visit: Payer: Self-pay | Admitting: Endocrinology

## 2020-06-10 ENCOUNTER — Other Ambulatory Visit: Payer: Self-pay | Admitting: Endocrinology

## 2020-06-21 ENCOUNTER — Other Ambulatory Visit: Payer: Self-pay | Admitting: Endocrinology

## 2020-07-23 ENCOUNTER — Other Ambulatory Visit: Payer: Self-pay

## 2020-07-23 ENCOUNTER — Other Ambulatory Visit (INDEPENDENT_AMBULATORY_CARE_PROVIDER_SITE_OTHER): Payer: 59

## 2020-07-23 DIAGNOSIS — Z794 Long term (current) use of insulin: Secondary | ICD-10-CM

## 2020-07-23 DIAGNOSIS — E1165 Type 2 diabetes mellitus with hyperglycemia: Secondary | ICD-10-CM

## 2020-07-23 LAB — BASIC METABOLIC PANEL
BUN: 35 mg/dL — ABNORMAL HIGH (ref 6–23)
CO2: 29 mEq/L (ref 19–32)
Calcium: 8.6 mg/dL (ref 8.4–10.5)
Chloride: 97 mEq/L (ref 96–112)
Creatinine, Ser: 2.13 mg/dL — ABNORMAL HIGH (ref 0.40–1.50)
GFR: 35.06 mL/min — ABNORMAL LOW (ref >60.00)
Glucose, Bld: 83 mg/dL (ref 70–99)
Potassium: 4.5 mEq/L (ref 3.5–5.1)
Sodium: 133 mEq/L — ABNORMAL LOW (ref 135–145)

## 2020-07-23 LAB — MICROALBUMIN / CREATININE URINE RATIO
Creatinine,U: 46.5 mg/dL
Microalb Creat Ratio: 110.7 mg/g — ABNORMAL HIGH (ref 0.0–30.0)
Microalb, Ur: 51.4 mg/dL — ABNORMAL HIGH (ref 0.0–1.9)

## 2020-07-23 LAB — HEMOGLOBIN A1C: Hgb A1c MFr Bld: 8.2 % — ABNORMAL HIGH (ref 4.6–6.5)

## 2020-07-25 ENCOUNTER — Other Ambulatory Visit: Payer: 59

## 2020-07-31 ENCOUNTER — Other Ambulatory Visit: Payer: Self-pay

## 2020-07-31 ENCOUNTER — Encounter: Payer: 59 | Admitting: Endocrinology

## 2020-07-31 ENCOUNTER — Encounter: Payer: Self-pay | Admitting: Endocrinology

## 2020-07-31 ENCOUNTER — Encounter: Payer: 59 | Attending: Endocrinology | Admitting: Dietician

## 2020-07-31 NOTE — Progress Notes (Signed)
This encounter was created in error - please disregard.

## 2020-08-03 ENCOUNTER — Other Ambulatory Visit: Payer: Self-pay | Admitting: Endocrinology

## 2020-08-04 ENCOUNTER — Other Ambulatory Visit: Payer: Self-pay | Admitting: Endocrinology

## 2020-08-12 ENCOUNTER — Other Ambulatory Visit: Payer: Self-pay

## 2020-08-12 ENCOUNTER — Encounter: Payer: Self-pay | Admitting: Endocrinology

## 2020-08-12 ENCOUNTER — Ambulatory Visit (INDEPENDENT_AMBULATORY_CARE_PROVIDER_SITE_OTHER): Payer: 59 | Admitting: Endocrinology

## 2020-08-12 VITALS — BP 108/72 | HR 87 | Ht 72.0 in | Wt 319.6 lb

## 2020-08-12 DIAGNOSIS — Z794 Long term (current) use of insulin: Secondary | ICD-10-CM

## 2020-08-12 DIAGNOSIS — I10 Essential (primary) hypertension: Secondary | ICD-10-CM

## 2020-08-12 DIAGNOSIS — N184 Chronic kidney disease, stage 4 (severe): Secondary | ICD-10-CM | POA: Diagnosis not present

## 2020-08-12 DIAGNOSIS — E1165 Type 2 diabetes mellitus with hyperglycemia: Secondary | ICD-10-CM

## 2020-08-12 LAB — GLUCOSE, POCT (MANUAL RESULT ENTRY): POC Glucose: 154 mg/dl — AB (ref 70–99)

## 2020-08-12 MED ORDER — DEXCOM G6 SENSOR MISC: 3 refills | Status: DC

## 2020-08-12 NOTE — Patient Instructions (Addendum)
Tresiba 84 units  Dinner Humalog 15 units unless big meal.

## 2020-08-12 NOTE — Progress Notes (Signed)
Patient ID: Austin Alexander, male   DOB: [birth year], 50 y.o.   MRN: [id number]           Reason for Appointment: Follow-up for Type 2 Diabetes   History of Present Illness:          Date of diagnosis of type 2 diabetes mellitus:  2009      Background history:   He is not clear when he was first diagnosed with diabetes Had been previously taking metformin and also for a few years Amaryl and Actos Insulin probably started in 2015 according to records and his A1c has been as high as 15.2 in the past He thinks Trulicity was added about a year ago At some point his A1c apparently has been 7-8% but does not know when  Recent history:    INSULIN regimen is: [place] 90 units daily, Humalog 20 units at lunch and dinner   Non-insulin hypoglycemic drugs the patient is taking are: , Ozempic 1 mg weekly  His A1c is higher than expected at 8.2, previously 7.5  Current management, blood sugar patterns and problems identified:  Blood sugars were assessed by his freestyle download and his GMI is indicated at only 6.5  However data from [facility] appears to be reading unusually low recently  Blood sugar was 154 in the office meter and 140 on his sensor but his sensor readings were fluctuating this morning  Although we also advised 12 units of Humalog for drinking his 2 cups of coffee in the morning he is not doing this unless the blood sugar is high  Also he has had some low readings including in the morning but asymptomatic  He is likely eating smaller meals in the evening and occasionally has drop in his blood sugars later after his Humalog dose  Also may be having low sugars early morning without symptoms  Has only rare blood sugar increased after meals but data is somewhat limited recently        Side effects from medications have been: None  Typical meal intake: Breakfast is frequently skipped, lunch is a largest meal         Analysis of freestyle [employer] for the last 2  weeks:   Overall blood sugars are significantly more variable in the morning hours but highest blood sugars are about 6-7 PM before dinner  On an average blood sugars are fairly stable throughout the day with only occasional blood sugar spikes in the mornings are late evening  HYPERGLYCEMIA is occurring inconsistently at various times especially in the late afternoons or sometimes midday  No consistent postprandial hyperglycemia seen  Blood sugars have been below normal inconsistently either overnight, late morning or late evening after 11 PM  Blood sugar data is incomplete with minimal readings between 4/23 through 4/27  CGM use % of time 53  2-week average/GV  133/42  Time in range    68    %  % Time Above 180  18+2  % Time above 250   % Time Below 70  12     PRE-MEAL Fasting Lunch Dinner Bedtime Overall  Glucose range:       Averages:  119  136  148  129  133   POST-MEAL PC Breakfast PC Lunch PC Dinner  Glucose range:     Averages:  146  138  157    Prior  CGM use % of time 82  2-week average/GV 168  Time in range  64  %  % Time Above 180 33  % Time above 250 3  % Time Below 70      PRE-MEAL Fasting Lunch Dinner Bedtime Overall  Glucose range:       Averages:  144  161  177  161  168   POST-MEAL PC Breakfast PC Lunch PC Dinner  Glucose range:     Averages:  198  159  183     Dietician visit, most recent: years ago  Weight history:  Wt Readings from Last 3 Encounters:  08/12/20 (!) 319 lb 9.6 oz (145 kg)  07/31/20 (!) 320 lb 1 oz (145.2 kg)  05/01/20 (!) 317 lb 12.8 oz (144.2 kg)    Glycemic control:   Lab Results  Component Value Date   HGBA1C 8.2 (H) 07/23/2020   HGBA1C 7.5 (H) 04/14/2020   HGBA1C 11.1 (H) 01/04/2020   Lab Results  Component Value Date   MICROALBUR 51.4 (H) 07/23/2020   LDLCALC 96 09/23/2013   CREATININE 2.13 (H) 07/23/2020   Lab Results  Component Value Date   MICRALBCREAT 110.7 (H) 07/23/2020    Lab Results   Component Value Date   FRUCTOSAMINE 217 02/19/2020   FRUCTOSAMINE 346 (H) 07/20/2019   FRUCTOSAMINE 348 (H) 06/21/2019    Office Visit on 08/12/2020  Component Date Value Ref Range Status  . POC Glucose 08/12/2020 154* 70 - 99 mg/dl Final    Allergies as of 08/12/2020   No Known Allergies     Medication List       Accurate as of Aug 12, 2020  1:50 PM. If you have any questions, ask your nurse or doctor.        Accu-Chek FastClix Lancets Misc USE AS INSTRUCTED TO CHECK BLOOD SUGAR 2 TIMES DAILY.   Accu-Chek Guide Me w/Device Kit by Does not apply route.   Accu-Chek Guide test strip Generic drug: glucose blood Use as instructed to check blood sugar 2 times daily.   allopurinol 100 MG tablet Commonly known as: ZYLOPRIM Take 1 tablet (100 mg total) by mouth daily.   aspirin 81 MG EC tablet Take 1 tablet (81 mg total) by mouth daily.   blood glucose meter kit and supplies Dispense based on patient and insurance preference. Use up to four times daily as directed. (FOR ICD-9 250.00, 250.01).   carvedilol 25 MG tablet Commonly known as: COREG Take 25 mg by mouth 2 (two) times daily with a meal.   digoxin 0.125 MG tablet Commonly known as: LANOXIN Take 125 mcg by mouth daily.   Entresto 49-51 MG Generic drug: sacubitril-valsartan TK 1 T PO BID   FreeStyle Libre 2 Sensor Misc USE DEVICES EVERY 14 DAYS What changed: Another medication with the same name was added. Make sure you understand how and when to take each. Changed by: Elayne Snare, MD   Dexcom G6 Sensor Misc Use to monitor blood sugar, change after 10 days What changed: You were already taking a medication with the same name, and this prescription was added. Make sure you understand how and when to take each. Changed by: Elayne Snare, MD   furosemide 40 MG tablet Commonly known as: LASIX Take 1 tablet (40 mg total) by mouth daily. What changed: how much to take   HumaLOG KwikPen 100 UNIT/ML  KwikPen Generic drug: insulin lispro INJECT 10 TO 15 UNITS UNDER THE SKIN THREE TIMES DAILY   multivitamin with minerals Tabs tablet Take 1 tablet by mouth daily.   Ozempic (  1 MG/DOSE) 4 MG/3ML Sopn Generic drug: Semaglutide (1 MG/DOSE) INJECT 1 MG UNDER THE SKIN ONCE A WEEK   sildenafil 100 MG tablet Commonly known as: VIAGRA   simvastatin 40 MG tablet Commonly known as: ZOCOR Take 1 tablet (40 mg total) by mouth daily.   Tyler Aas FlexTouch 200 UNIT/ML FlexTouch Pen Generic drug: insulin degludec ADMINISTER 90 UNITS UNDER THE SKIN EVERY DAY       Allergies: No Known Allergies  Past Medical History:  Diagnosis Date  . 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 Abscess of perineum   . Alcohol abuse   . Asthma    History of,  . Bronchitis   . CHF (congestive heart failure) ([place])   . COPD (chronic obstructive pulmonary disease) (HCC)    moderate obstruction with low vital capacity  . Croup   . Cystic acne    with acne keloidosis  . Diabetes mellitus without complication (HCC)    Type 2, uncontrolled with questionable improvement  . Dietary noncompliance    History of,  . Dizziness   . EKG, abnormal    History of, with negative cardiac evaluation by history.  . Erectile dysfunction    Multifactorial. Improved with Cialis. New onset.  . H/O folliculitis   . Heartburn   . 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 Hyperlipidemia   . Hypertension    variable in nature  . Indigestion   . Mild obesity   . Multiple excoriations    multifactorial in origin  . Situational stress    secondary to financial issues  . Tobacco abuse     Past Surgical History:  Procedure Laterality Date  . I & D EXTREMITY Left 06/28/2014   Procedure: IRRIGATION AND DEBRIDEMENT EXTREMITY;  Surgeon: Leanora Cover, MD;  Location: [facility];  Service: Orthopedics;  Laterality: Left;  . INCISION AND DRAINAGE PERIRECTAL ABSCESS N/A 02/17/2014   Procedure: IRRIGATION  AND DEBRIDEMENT PERIRECTAL  AND SCROTAL ABSCESS;  Surgeon: Coralie Keens, MD;  Location: [place];  Service: General;  Laterality: N/A;  . MINOR IRRIGATION AND DEBRIDEMENT OF WOUND Left 06/20/2015   Procedure: IRRIGATION AND DEBRIDEMENT Wound with nailbed repair;  Surgeon: Leanora Cover, MD;  Location: [place];  Service: Orthopedics;  Laterality: Left;    Family History  Problem Relation Age of Onset  . Diabetes type II Mother   . Hypertension Mother   . Diabetes Other     Social History:  reports that he has been smoking cigarettes. He has a 20.00 pack-year smoking history. He has never used smokeless tobacco. He reports current alcohol use of about 12.0 standard drinks of alcohol per week. He reports that he does not use drugs.   Review of Systems   Lipid history: Treated by PCP with simvastatin, has mixed hyperlipidemia    Lab Results  Component Value Date   CHOL 128 02/19/2020   HDL 27.20 (L) 02/19/2020   LDLCALC 96 09/23/2013   LDLDIRECT 68.0 02/19/2020   TRIG 236.0 (H) 02/19/2020   CHOLHDL 5 02/19/2020           Hypertension: Has been treated with carvedilol and Entresto Followed by cardiologist Dr. Terrence Dupont Blood pressure is not consistently controlled  BP Readings from Last 3 Encounters:  08/12/20 108/72  07/31/20 130/82  05/01/20 136/88     Most recent foot exam: 12/19  Currently known complications of diabetes: Erectile dysfunction, minimal neuropathy, microalbuminuria  CKD: Not followed  by nephrologist, creatinine levels as follows  Lab Results  Component Value Date   CREATININE 2.13 (H) 07/23/2020   CREATININE 1.73 (H) 04/14/2020   CREATININE 1.92 (H) 02/19/2020   HYPERKALEMIA: Improved  Lab Results  Component Value Date   K 4.5 07/23/2020    HYPONATREMIA: His sodium is persistently low but asymptomatic  Not on diuretics like HCTZ, only on Lasix daily No leg edema Urine osmolality on 01/17/2020 was high at 433  Sodium appears  improved  No history of thyroid disease  Lab Results  Component Value Date   NA 133 (L) 07/23/2020   K 4.5 07/23/2020   CL 97 07/23/2020   CO2 29 07/23/2020   Lab Results  Component Value Date   TSH 3.72 01/17/2020    LABS:  Office Visit on 08/12/2020  Component Date Value Ref Range Status  . POC Glucose 08/12/2020 154* 70 - 99 mg/dl Final    Lab Results  Component Value Date   TSH 3.72 01/17/2020    Physical Examination:  BP 108/72   Pulse 87   Ht 6' (1.829 m)   Wt (!) 319 lb 9.6 oz (145 kg)   SpO2 92%   BMI 43.35 kg/m       ASSESSMENT:  Diabetes type 2 on insulin  See history of present illness for detailed discussion of current diabetes management, blood sugar patterns and problems identified  His A1c is relatively higher at 8.2  He is on basal bolus insulin and Ozempic  Although his blood sugars at home are looking fairly good with average only 133 is appearing to be getting falsely low readings on his freestyle libre Also needing somewhat less insulin especially at mealtimes #4 Despite But he still has difficulty losing weight   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 Sodium is improving, likely doing better with fluid restriction  CKD: Discussed that this is getting relatively worse and needs to be seen by nephrologist, will refer   PLAN:     He will call the freestyle company to get a replacement sensor  Also we will try to see if he can get the Dexcom sensor covered and prescription sent to the pharmacy, given also patient information and instructions on how to start this  He will reduce his suppertime dose of Humalog by 5 units  Also for now reduce Tresiba 24 units to avoid overnight hypoglycemia  No change in blood pressure medications as yet  Regular eye exams recommended Regular exercise  Patient Instructions  Tresiba 84 units  Dinner Humalog 15 units unless big meal.     Elayne Snare 08/12/2020, 1:50 PM   Note: This office note was prepared with Dragon voice recognition system technology. Any transcriptional errors that result from this process are unintentional.

## 2020-08-18 ENCOUNTER — Other Ambulatory Visit: Payer: Self-pay

## 2020-08-18 ENCOUNTER — Ambulatory Visit: Payer: 59 | Admitting: Podiatry

## 2020-08-18 ENCOUNTER — Encounter: Payer: Self-pay | Admitting: Podiatry

## 2020-08-18 DIAGNOSIS — B351 Tinea unguium: Secondary | ICD-10-CM

## 2020-08-18 DIAGNOSIS — M79676 Pain in unspecified toe(s): Secondary | ICD-10-CM | POA: Diagnosis not present

## 2020-08-18 DIAGNOSIS — E0842 Diabetes mellitus due to underlying condition with diabetic polyneuropathy: Secondary | ICD-10-CM

## 2020-08-18 DIAGNOSIS — M79609 Pain in unspecified limb: Secondary | ICD-10-CM

## 2020-08-18 NOTE — Patient Instructions (Signed)

## 2020-08-23 NOTE — Progress Notes (Signed)
Subjective: 50 year old male presents the office today for diabetic foot evaluation and have his nails trimmed as they are thickened elongated he has difficulty doing them they cause discomfort.  Denies any open lesions.  He has not had any new concerns with his feet.  Last A1c was 8.2 on June 22, 2020.    Objective: AAO x3, NAD DP/PT pulses palpable bilaterally, CRT less than 3 seconds Protective sensation decreased with Simms Weinstein monofilament Nails are hypertrophic, dystrophic, brittle, discolored, elongated 10. No surrounding redness or drainage. Tenderness nails 1-5 bilaterally. No open lesions or pre-ulcerative lesions are identified today. No areas of pinpoint tenderness today.  Flexor, extensor tendons.  Intact.  MMT 5/5. No edema, erythema, increase in warmth to bilateral lower extremities.  No pain with calf compression, swelling, warmth, erythema  Assessment: Uncontrolled type 2 diabetes with neuropathy; symptomatic onychomycosis  Plan: -All treatment options discussed with the patient including all alternatives, risks, complications.  -Sharply debrided the nails x10 without any complications or bleeding -Discussed daily foot inspection, glucose control.  Return in about 3 months (around 11/18/2020).  Trula Slade DPM

## 2020-09-02 ENCOUNTER — Other Ambulatory Visit: Payer: Self-pay | Admitting: Endocrinology

## 2020-09-23 ENCOUNTER — Other Ambulatory Visit: Payer: Self-pay | Admitting: Internal Medicine

## 2020-09-23 DIAGNOSIS — N184 Chronic kidney disease, stage 4 (severe): Secondary | ICD-10-CM

## 2020-10-02 ENCOUNTER — Other Ambulatory Visit: Payer: Self-pay | Admitting: Endocrinology

## 2020-10-03 ENCOUNTER — Telehealth: Payer: Self-pay | Admitting: Endocrinology

## 2020-10-03 DIAGNOSIS — Z794 Long term (current) use of insulin: Secondary | ICD-10-CM

## 2020-10-03 NOTE — Telephone Encounter (Signed)
Pt is stating that he is in need for the meter. For the [place], [place]

## 2020-10-07 ENCOUNTER — Telehealth: Payer: Self-pay | Admitting: Pharmacy Technician

## 2020-10-07 ENCOUNTER — Other Ambulatory Visit: Payer: Self-pay

## 2020-10-07 ENCOUNTER — Ambulatory Visit
Admission: RE | Admit: 2020-10-07 | Discharge: 2020-10-07 | Disposition: A | Discharge status: Home / Self Care | Payer: 59 | Source: Ambulatory Visit | Attending: Internal Medicine | Admitting: Internal Medicine

## 2020-10-07 DIAGNOSIS — N184 Chronic kidney disease, stage 4 (severe): Secondary | ICD-10-CM

## 2020-10-07 NOTE — Telephone Encounter (Addendum)
Patient Advocate Encounter   Received notification from [facility] that prior authorization for [facility] is required.   PA submitted on 10/07/2020 [place] Status is APPROVED  [phone] through 10/07/2021    [facility] will continue to follow.   Venida Jarvis. Nadara Mustard, CPhT Patient Advocate [facility] Phone: [phone] Fax:  [phone]

## 2020-10-09 MED ORDER — DEXCOM G6 TRANSMITTER MISC: 1 refills | Status: DC

## 2020-10-09 MED ORDER — DEXCOM G6 RECEIVER DEVI: 0 refills | Status: DC

## 2020-10-09 NOTE — Telephone Encounter (Signed)
Rx sent to preferred pharmacy.

## 2020-10-23 ENCOUNTER — Other Ambulatory Visit (INDEPENDENT_AMBULATORY_CARE_PROVIDER_SITE_OTHER): Payer: 59

## 2020-10-23 ENCOUNTER — Telehealth: Payer: Self-pay

## 2020-10-23 ENCOUNTER — Other Ambulatory Visit: Payer: Self-pay

## 2020-10-23 ENCOUNTER — Encounter: Payer: Self-pay | Admitting: Endocrinology

## 2020-10-23 ENCOUNTER — Ambulatory Visit (INDEPENDENT_AMBULATORY_CARE_PROVIDER_SITE_OTHER): Payer: 59 | Admitting: Endocrinology

## 2020-10-23 VITALS — BP 135/80 | HR 87 | Ht 71.0 in | Wt 327.0 lb

## 2020-10-23 DIAGNOSIS — Z794 Long term (current) use of insulin: Secondary | ICD-10-CM | POA: Diagnosis not present

## 2020-10-23 DIAGNOSIS — R600 Localized edema: Secondary | ICD-10-CM | POA: Diagnosis not present

## 2020-10-23 DIAGNOSIS — I10 Essential (primary) hypertension: Secondary | ICD-10-CM | POA: Diagnosis not present

## 2020-10-23 DIAGNOSIS — N184 Chronic kidney disease, stage 4 (severe): Secondary | ICD-10-CM

## 2020-10-23 DIAGNOSIS — E871 Hypo-osmolality and hyponatremia: Secondary | ICD-10-CM

## 2020-10-23 DIAGNOSIS — E1165 Type 2 diabetes mellitus with hyperglycemia: Secondary | ICD-10-CM | POA: Diagnosis not present

## 2020-10-23 LAB — TSH: TSH: 3.81 u[IU]/mL (ref 0.35–5.50)

## 2020-10-23 LAB — COMPREHENSIVE METABOLIC PANEL
ALT: 14 U/L (ref 0–53)
AST: 17 U/L (ref 0–37)
Albumin: 3.4 g/dL — ABNORMAL LOW (ref 3.5–5.2)
Alkaline Phosphatase: 96 U/L (ref 39–117)
BUN: 36 mg/dL — ABNORMAL HIGH (ref 6–23)
CO2: 28 mEq/L (ref 19–32)
Calcium: 8.3 mg/dL — ABNORMAL LOW (ref 8.4–10.5)
Chloride: 86 mEq/L — ABNORMAL LOW (ref 96–112)
Creatinine, Ser: 2.28 mg/dL — ABNORMAL HIGH (ref 0.40–1.50)
GFR: 32.25 mL/min — ABNORMAL LOW (ref >60.00)
Glucose, Bld: 157 mg/dL — ABNORMAL HIGH (ref 70–99)
Potassium: 4.9 mEq/L (ref 3.5–5.1)
Sodium: 121 mEq/L — CL (ref 135–145)
Total Bilirubin: 0.4 mg/dL (ref 0.2–1.2)
Total Protein: 7.3 g/dL (ref 6.0–8.3)

## 2020-10-23 LAB — HEMOGLOBIN A1C: Hgb A1c MFr Bld: 9.6 % — ABNORMAL HIGH (ref 4.6–6.5)

## 2020-10-23 LAB — T4, FREE: Free T4: 1.1 ng/dL (ref 0.60–1.60)

## 2020-10-23 NOTE — Telephone Encounter (Signed)
Main lab call with a critical sod level 121.

## 2020-10-23 NOTE — Patient Instructions (Addendum)
Elastic bandage on Rt leg  No ibuprofen   Reduce fluids and drink only when thirsty  Call Dr Marlou Sa for swelling  No Doxycycline

## 2020-10-23 NOTE — Telephone Encounter (Signed)
I called patient to inform him of sodium concentration. Pt states he doesn't feel weak or nauseous just a little tired. Pt will come in today at 3:15 after he gets off work he states.

## 2020-10-23 NOTE — Progress Notes (Signed)
Austin Alexander 1971-01-06            Chief complaint: Low sodium  History of Present Illness:  HYPONATREMIA: He was seen urgently today because of sodium level of 121  His sodium is persistently low but has improved to 133 on the last visit  Not on diuretics like HCTZ, only on Lasix which he is now taking 40 mg twice daily  Previously urine osmolality on 01/17/2020 was high at 433  Sodium appears lower for no apparent reason He again does not complain of any decreased appetite, nausea, unusual fatigue or weakness Not on any new medications recently  He appears to have had some weight gain but is also having some swelling of his right leg He says that he drinks about 4 bottles of water per day along with juice and other drinks, has not cut back on his fluid intake  No history of thyroid disease  EDEMA: He says that he has had swelling of the right leg for the last week On further questioning he says that the swelling is mostly during the day and usually goes down overnight No local pain or tenderness in the leg He has not discussed with his PCP  His nephrologist asked him to take 40 mg twice daily of Lasix, was seen about 3 to 4 weeks ago but no information available  RENAL dysfunction:  This appears to be relatively worse compared to last year Also recently has tried to take some ibuprofen as he thought he may have gout in his leg  Lab Results  Component Value Date   CREATININE 2.28 (H) 10/23/2020   CREATININE 2.13 (H) 07/23/2020   CREATININE 1.73 (H) 04/14/2020   DIABETES: He did not bring his freestyle libre meter to review but his A1c appears to be higher  Allergies as of 10/23/2020   No Known Allergies      Medication List        Accurate as of October 23, 2020  5:02 PM. If you have any questions, ask your nurse or doctor.          Accu-Chek FastClix Lancets Misc USE AS INSTRUCTED TO CHECK BLOOD SUGAR 2 TIMES DAILY.   Accu-Chek Guide Me w/Device Kit by  Does not apply route.   Accu-Chek Guide test strip Generic drug: glucose blood Use as instructed to check blood sugar 2 times daily.   allopurinol 100 MG tablet Commonly known as: ZYLOPRIM Take 1 tablet (100 mg total) by mouth daily.   aspirin 81 MG EC tablet Take 1 tablet (81 mg total) by mouth daily.   B-D ULTRAFINE III SHORT PEN 31G X 8 MM Misc Generic drug: Insulin Pen Needle SMARTSIG:Pre-Filled Pen Syringe SUB-Q Daily   blood glucose meter kit and supplies Dispense based on patient and insurance preference. Use up to four times daily as directed. (FOR ICD-9 250.00, 250.01).   carvedilol 25 MG tablet Commonly known as: COREG Take 25 mg by mouth 2 (two) times daily with a meal.   Dexcom G6 Receiver Devi Use to check blood sugars daily   Dexcom G6 Transmitter Misc Change every 90 days   digoxin 0.125 MG tablet Commonly known as: LANOXIN Take 125 mcg by mouth daily.   doxycycline 100 MG tablet Commonly known as: VIBRA-TABS Take by mouth.   Entresto 49-51 MG Generic drug: sacubitril-valsartan TK 1 T PO BID   FreeStyle Libre 2 Sensor Misc USE DEVICES EVERY 14 DAYS   Dexcom G6 Sensor Misc Use to monitor  blood sugar, change after 10 days   furosemide 40 MG tablet Commonly known as: LASIX Take 1 tablet (40 mg total) by mouth daily. What changed: how much to take   HumaLOG KwikPen 100 UNIT/ML KwikPen Generic drug: insulin lispro ADMINISTER 10 TO 15 UNITS UNDER THE SKIN THREE TIMES DAILY   multivitamin with minerals Tabs tablet Take 1 tablet by mouth daily.   Ozempic (1 MG/DOSE) 4 MG/3ML Sopn Generic drug: Semaglutide (1 MG/DOSE) INJECT 1MG UNDER THE SKIN ONCE A WEEK   sildenafil 100 MG tablet Commonly known as: VIAGRA   simvastatin 40 MG tablet Commonly known as: ZOCOR Take 1 tablet (40 mg total) by mouth daily.   tadalafil 20 MG tablet Commonly known as: CIALIS Take 20 mg by mouth 2 (two) times a week.   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   Erectile dysfunction    Multifactorial. Improved with Cialis. New onset.   H/O folliculitis    Heartburn    Herpes exposure    History of herpetic exposure   Hyperlipidemia    Hypertension    variable in nature   Indigestion    Mild obesity    Multiple excoriations    multifactorial in origin   Situational stress    secondary to financial issues   Tobacco abuse     Past Surgical History:  Procedure Laterality Date   I & D EXTREMITY Left 06/28/2014   Procedure: IRRIGATION AND DEBRIDEMENT EXTREMITY;  Surgeon: Leanora Cover, MD;  Location: [place];  Service: Orthopedics;  Laterality: Left;   INCISION AND DRAINAGE PERIRECTAL ABSCESS N/A 02/17/2014   Procedure: IRRIGATION AND DEBRIDEMENT PERIRECTAL  AND SCROTAL ABSCESS;  Surgeon: Coralie Keens, MD;  Location: [place];  Service: General;  Laterality: N/A;   MINOR IRRIGATION AND DEBRIDEMENT OF WOUND Left 06/20/2015   Procedure: IRRIGATION AND DEBRIDEMENT Wound with nailbed repair;  Surgeon: Leanora Cover, MD;  Location: [place];  Service: Orthopedics;  Laterality: Left;    Family History  Problem Relation Age of Onset   Diabetes type II Mother    Hypertension Mother    Diabetes Other     Social  History:  reports that he has been smoking cigarettes. He has a 20.00 pack-year smoking history. He has never used smokeless tobacco. He reports current alcohol use of about 12.0 standard drinks of alcohol per week. He reports that he does not use drugs.   Lab on 10/23/2020  Component Date Value Ref Range Status   Sodium 10/23/2020 121 (A) 135 - 145 mEq/L Final   Potassium 10/23/2020 4.9  3.5 - 5.1 mEq/L Final   Chloride  10/23/2020 86 (A) 96 - 112 mEq/L Final   CO2 10/23/2020 28  19 - 32 mEq/L Final   Glucose, Bld 10/23/2020 157 (A) 70 - 99 mg/dL Final   BUN 10/23/2020 36 (A) 6 - 23 mg/dL Final   Creatinine, Ser 10/23/2020 2.28 (A) 0.40 - 1.50 mg/dL Final   Total Bilirubin 10/23/2020 0.4  0.2 - 1.2 mg/dL Final   Alkaline Phosphatase 10/23/2020 96  39 - 117 U/L Final   AST 10/23/2020 17  0 - 37 U/L Final   ALT 10/23/2020 14  0 - 53 U/L Final   Total Protein 10/23/2020 7.3  6.0 - 8.3 g/dL Final   Albumin 10/23/2020 3.4 (A) 3.5 - 5.2 g/dL Final   GFR 10/23/2020 32.25 (A) >60.00 mL/min Final   Calculated using the CKD-EPI Creatinine Equation (2021)   Calcium 10/23/2020 8.3 (A) 8.4 - 10.5 mg/dL Final   Hgb A1c MFr Bld 10/23/2020 9.6 (A) 4.6 - 6.5 % Final   Glycemic Control Guidelines for People with Diabetes:Non Diabetic:  <6%Goal of Therapy: <7%Additional Action Suggested:  >8%     EXAM:  BP 135/80 (Patient Position: Standing)   Pulse 87   Ht '5\' 11"'  (1.803 m)   Wt (!) 327 lb (148.3 kg)   SpO2 95%   BMI 45.61 kg/m   Physical Exam  He has 2+ right lower leg edema No calf tenderness No local skin changes or erythema   Assessment/Plan:   He will need to restrict his fluid intake significantly and cut back to only 2 bottles of water daily and less fluids, to drink mostly when he is thirsty and not on a routine basis Check urine osmolality and urine sodium again  Stop ibuprofen and doxycycline  He needs to talk to his PCP regarding leg edema but in the meantime can start  putting an Ace bandage in the morning before going to work.  Will defer any need for Doppler imaging of the veins in the leg Recheck thyroid levels  Will need to get information from the nephrologist In the meantime continue 40 mg twice daily of Lasix  Continue to work with his PCP and cardiologist regarding blood pressure management  Elayne Snare 10/23/2020, 5:02 PM

## 2020-10-24 LAB — OSMOLALITY, URINE: Osmolality, Ur: 242 mOsmol/kg

## 2020-10-24 LAB — SODIUM, URINE, RANDOM: Sodium, Ur: 31 mmol/L

## 2020-10-28 ENCOUNTER — Other Ambulatory Visit: Payer: Self-pay | Admitting: Endocrinology

## 2020-10-31 ENCOUNTER — Other Ambulatory Visit: Payer: 59

## 2020-11-03 ENCOUNTER — Encounter: Payer: Self-pay | Admitting: Endocrinology

## 2020-11-03 ENCOUNTER — Encounter: Payer: 59 | Admitting: Endocrinology

## 2020-11-03 ENCOUNTER — Other Ambulatory Visit: Payer: Self-pay

## 2020-11-03 NOTE — Progress Notes (Signed)
This encounter was created in error - please disregard.

## 2020-11-04 ENCOUNTER — Other Ambulatory Visit: Payer: Self-pay | Admitting: Endocrinology

## 2020-11-04 DIAGNOSIS — E1165 Type 2 diabetes mellitus with hyperglycemia: Secondary | ICD-10-CM

## 2020-11-08 ENCOUNTER — Other Ambulatory Visit: Payer: Self-pay | Admitting: Endocrinology

## 2020-11-10 ENCOUNTER — Encounter: Payer: 59 | Attending: Endocrinology | Admitting: Nutrition

## 2020-11-10 ENCOUNTER — Other Ambulatory Visit: Payer: Self-pay

## 2020-11-10 DIAGNOSIS — E1165 Type 2 diabetes mellitus with hyperglycemia: Secondary | ICD-10-CM | POA: Diagnosis present

## 2020-11-10 NOTE — Progress Notes (Signed)
This patient was trained on how to use the Dexcom sensor/transmitter and receiver.  His phone was not able to accept the app, so his transmitter was set with date/time and he was shown how to insert the transmitter and sensor numbers.  He was given a sheet with directions on how to send the readings to this practice.   He was encouraged to read the [facility] and agreed to do this. He had no final questions.

## 2020-11-10 NOTE — Patient Instructions (Signed)
Replace the sensor every 10 days Replace the transmitter every 3 months Call the 800 number if questions, or if sensor falls off before it ends.

## 2020-11-20 ENCOUNTER — Ambulatory Visit: Payer: 59 | Admitting: Podiatry

## 2020-11-20 ENCOUNTER — Ambulatory Visit (INDEPENDENT_AMBULATORY_CARE_PROVIDER_SITE_OTHER): Payer: 59

## 2020-11-20 ENCOUNTER — Other Ambulatory Visit: Payer: Self-pay

## 2020-11-20 DIAGNOSIS — E0842 Diabetes mellitus due to underlying condition with diabetic polyneuropathy: Secondary | ICD-10-CM | POA: Diagnosis not present

## 2020-11-20 DIAGNOSIS — M79609 Pain in unspecified limb: Secondary | ICD-10-CM | POA: Diagnosis not present

## 2020-11-20 DIAGNOSIS — R600 Localized edema: Secondary | ICD-10-CM | POA: Diagnosis not present

## 2020-11-20 DIAGNOSIS — M7989 Other specified soft tissue disorders: Secondary | ICD-10-CM | POA: Diagnosis not present

## 2020-11-20 DIAGNOSIS — B351 Tinea unguium: Secondary | ICD-10-CM | POA: Diagnosis not present

## 2020-11-20 DIAGNOSIS — M79676 Pain in unspecified toe(s): Secondary | ICD-10-CM

## 2020-11-20 NOTE — Patient Instructions (Signed)
[facility] An [employer] is a type of bandage (dressing) for the foot and leg. The dressing is a gauze wrap that is soaked with a type of medicine called zinc oxide. The gauze may also include other lotions and medicines that help in wound healing, such as calamine. An Unna boot may be used to treat: Open sores (ulcers) on the foot, heel, or leg. Swelling from disorders that affect the veins or lymphatic system (lymphedema). Skin conditions such as chronic inflammation caused by poor blood flow (stasis dermatitis). The dressing is applied by a health care provider. The gauze is wrapped around your lower extremity in several layers, usually starting at the toes and going upward to the knee. A dry outer wrap goes over the medicated wrap for supportand compression.  Before applying the [employer], your health care provider will clean your leg and foot and may apply an antibiotic ointment. You may be asked to raise (elevate) your leg for a while to reduce swelling before the boot is applied. The boot will dry and harden after it is applied. The boot may need to be changed orreplaced about twice a week. Follow these instructions at home: [place] as told by your health care provider. You may need to wear a slipper or shoe over the boot that is one or two sizes larger than normal. Check the skin around the boot every day. Tell your health care provider about any concerns. Do not stick anything inside the boot to scratch your skin. Doing that increases your risk of infection. Keep your [employer] clean and dry. Check every day for signs of infection. Check for: Redness, swelling, or pain in your foot or toes. Fluid or blood coming from the boot. Pus or a bad smell coming from the boot. Remove the boot and call your health care provider if you have signs of poor blood flow, such as: Your toes tingle or become numb. Your toes turn cold or turn blue or pale. Your toes are more swollen  or painful. You are unable to move your toes. Activity You may walk with the boot once it has dried. Ask your health care provider how much walking is safe for you. Avoid sitting for a long time without moving. Get up to take short walks as told by your health care provider. This is important to improve blood flow. Bathing Do not take baths, swim, or use a hot tub until your health care provider approves. Ask your health care provider if you may take showers. If your health care provider approves a bath or a shower, do not let the Unna boot get wet. If you take a shower, cover the boot with a watertight covering. If you take a bath, keep your leg with the boot out of the tub. General instructions Keep your leg elevated above the level of your heart while you are sitting or lying down. This will decrease swelling. Do not sit with your knee bent for long periods of time. Take over-the-counter and prescription medicines only as told by your health care provider. Do not use any products that contain nicotine or tobacco, such as cigarettes, e-cigarettes, and chewing tobacco. These can delay healing. If you need help quitting, ask your health care provider. Keep all follow-up visits as told by your health care provider. This is important. Contact a health care provider if: Your skin feels itchy inside the boot. You have a burning sensation, a rash, or  itchy, red, swollen areas of skin (hives) in the boot area. You have a fever or chills. You have any signs of infection, such as: New redness, swelling, or pain. More fluid or blood coming from the boot. Pus or a bad smell coming from the boot. You have increased numbness or pain in your foot or toes. You have any changes in skin color on your foot or toes, such as the skin turning blue or pale or developing patchy areas with spots. Your boot has been damaged or feels like it is no longer fitting properly. Summary An [place] boot is a type of bandage  (dressing) system for the foot and leg. The dressing is a gauze wrap that is soaked with a type of medicine (zinc oxide) to treat foot, heel, or leg ulcers, swelling from disorders that affect the veins or lymphatic system (lymphedema), and skin conditions caused by poor blood flow (stasis dermatitis). This dressing is applied by a health care provider. After it is applied, the boot will dry and harden. The boot may need to be changed or replaced about twice a week. Let your health care provider know if you have any signs of poor blood flow or infection. This information is not intended to replace advice given to you by your health care provider. Make sure you discuss any questions you have with your healthcare provider. Document Revised: 07/18/2018 Document Reviewed: 12/07/2017 Elsevier Patient Education  [place].

## 2020-11-25 ENCOUNTER — Telehealth: Payer: Self-pay | Admitting: Nutrition

## 2020-11-25 NOTE — Progress Notes (Signed)
Subjective: 50 year old male presents the office today for diabetic foot evaluation and have his nails trimmed as they are thickened elongated he has difficulty doing them they cause discomfort.  Denies any open lesions.  He has been swelling to the right ankle and foot which provement over last 2 to 3 weeks.  He tried wearing compression socks which helped some.  He is also been on fluid pills.  Denies any recent injury.  No chest pain or shortness of breath.  No fevers or chills.  No open lesions.  Last A1c was up to 9.6 on October 23, 2020    Objective: AAO x3, NAD DP/PT pulses palpable bilaterally, CRT less than 3 seconds Protective sensation decreased with Simms Weinstein monofilament Nails are hypertrophic, dystrophic, brittle, discolored, elongated 10. No surrounding redness or drainage. Tenderness nails 1-5 bilaterally. No open lesions or pre-ulcerative lesions are identified today. Edema present to the right foot, ankle.  There is no erythema or warmth associated.  There is no open lesions or any drainage.  No significant tenderness to palpation with associated edema. No pain with calf compression, swelling, warmth, erythema  Assessment: Uncontrolled type 2 diabetes with neuropathy; symptomatic onychomycosis; right lower extremity edema  Plan: -All treatment options discussed with the patient including all alternatives, risks, complications.  -X-rays obtained reviewed the right side.  Evidence of acute fracture.  Given the edema encouraged elevation.  Unna boot was applied and precautions were advised on when to remove this.  Continue with compression socks after the Unna boot is removed as well.  No pain to right calf and calf is supple.  DVT suspicion is low at this time. -Sharply debrided the nails x10 without any complications or bleeding -Discussed daily foot inspection, glucose control.  Return in about 3 months (around 11/18/2020).  Trula Slade DPM

## 2020-12-03 ENCOUNTER — Encounter: Payer: Self-pay | Admitting: Endocrinology

## 2020-12-09 ENCOUNTER — Encounter: Payer: Self-pay | Admitting: Endocrinology

## 2020-12-09 ENCOUNTER — Other Ambulatory Visit: Payer: Self-pay

## 2020-12-09 ENCOUNTER — Ambulatory Visit (INDEPENDENT_AMBULATORY_CARE_PROVIDER_SITE_OTHER): Payer: 59 | Admitting: Endocrinology

## 2020-12-09 VITALS — BP 130/78 | HR 88 | Ht 71.0 in | Wt 311.6 lb

## 2020-12-09 DIAGNOSIS — R600 Localized edema: Secondary | ICD-10-CM

## 2020-12-09 DIAGNOSIS — E1165 Type 2 diabetes mellitus with hyperglycemia: Secondary | ICD-10-CM | POA: Diagnosis not present

## 2020-12-09 DIAGNOSIS — E7800 Pure hypercholesterolemia, unspecified: Secondary | ICD-10-CM

## 2020-12-09 DIAGNOSIS — Z794 Long term (current) use of insulin: Secondary | ICD-10-CM

## 2020-12-09 DIAGNOSIS — E871 Hypo-osmolality and hyponatremia: Secondary | ICD-10-CM | POA: Diagnosis not present

## 2020-12-09 NOTE — Progress Notes (Signed)
Patient ID: Austin Alexander, male   DOB: 1971-03-16, 50 y.o.   MRN: [id number]           Reason for Appointment: Follow-up for Type 2 Diabetes   History of Present Illness:          Date of diagnosis of type 2 diabetes mellitus:  2009      Background history:   He is not clear when he was first diagnosed with diabetes Had been previously taking metformin and also for a few years Amaryl and Actos Insulin probably started in 2015 according to records and his A1c has been as high as 15.2 in the past He thinks Trulicity was added about a year ago At some point his A1c apparently has been 7-8% but does not know when  Recent history:    INSULIN regimen is: [place] 90 units daily, Humalog 20 units at lunch and dinner   Non-insulin hypoglycemic drugs the patient is taking are: Ozempic 1 mg weekly  His A1c is higher than expected at 8.2, previously 7.5  Current management, blood sugar patterns and problems identified: Blood sugars were assessed by his Dexcom which he is using instead of the [place] which was falsely low and he had some difficulty keeping the sensor on  He says that sometimes he will not have his NovoLog with him at lunchtime but otherwise trying to take it However not adjusting based on meal size and a couple of times has high readings likely to be from larger meals Also with drinking some juice or eating fruit late in the evening may have higher readings at bedtime Also not clear why overnight blood sugars are variable He has continued his Ozempic regularly Weight appears to be decreasing but may have had more edema before        Side effects from medications have been: None  Typical meal intake: Breakfast is frequently skipped, lunch is a largest meal         Analysis of DEXCOM 6 download for the last 2 weeks until 8/24:  Overall blood sugars are modestly higher throughout the day and night with more variability overnight and in the afternoon  PREMEAL blood sugars  are generally around 150 at all times including in the morning OVERNIGHT blood sugars are somewhat variable and sometimes near normal but generally declining slowly between midnight and 3 AM; low blood sugar on 1 night was likely artifact POSTPRANDIAL readings are variably high after lunch Postprandial readings in the evenings are not significantly higher after supper but may be higher after 9 PM  No hypoglycemia with only 1 low normal reading early afternoon and once overnight   CGM use % of time 64  2-week average/GV 159/41  Time in range       67 %  % Time Above 180 32  % Time above 250   % Time Below 70 <1     PRE-MEAL Fasting Lunch Dinner Bedtime Overall  Glucose range:       Averages: 55 158 146 190    POST-MEAL PC Breakfast PC Lunch PC Dinner  Glucose range:     Averages:  179 161   Previously:   CGM use % of time 53  2-week average/GV  133/42  Time in range    68    %  % Time Above 180  18+2  % Time above 250   % Time Below 70  12     PRE-MEAL Fasting Lunch Dinner Bedtime Overall  Glucose range:       Averages:  119  136  148  129  133   POST-MEAL PC Breakfast PC Lunch PC Dinner  Glucose range:     Averages:  146  138  157      Dietician visit, most recent: years ago  Weight history:  Wt Readings from Last 3 Encounters:  12/09/20 (!) 311 lb 9.6 oz (141.3 kg)  11/03/20 (!) 313 lb 6.4 oz (142.2 kg)  10/23/20 (!) 327 lb (148.3 kg)    Glycemic control:   Lab Results  Component Value Date   HGBA1C 9.6 (H) 10/23/2020   HGBA1C 8.2 (H) 07/23/2020   HGBA1C 7.5 (H) 04/14/2020   Lab Results  Component Value Date   MICROALBUR 51.4 (H) 07/23/2020   LDLCALC 96 09/23/2013   CREATININE 2.28 (H) 10/23/2020   Lab Results  Component Value Date   MICRALBCREAT 110.7 (H) 07/23/2020    Lab Results  Component Value Date   FRUCTOSAMINE 217 02/19/2020   FRUCTOSAMINE 346 (H) 07/20/2019   FRUCTOSAMINE 348 (H) 06/21/2019    No visits with results within 1  Week(s) from this visit.  Latest known visit with results is:  Office Visit on 10/23/2020  Component Date Value Ref Range Status   Sodium, Ur 10/23/2020 31  Not Estab. mmol/L Final   Free T4 10/23/2020 1.10  0.60 - 1.60 ng/dL Final   Comment: Specimens from patients who are undergoing biotin therapy and /or ingesting biotin supplements may contain high levels of biotin.  The higher biotin concentration in these specimens interferes with this Free T4 assay.  Specimens that contain high levels  of biotin may cause false high results for this Free T4 assay.  Please interpret results in light of the total clinical presentation of the patient.     TSH 10/23/2020 3.81  0.35 - 5.50 uIU/mL Final   Osmolality, Ur 10/23/2020 242  mOsmol/kg Final   Comment:                                   24 hr :   300 -  900                                   Random:    50 - 1400                                   After 12hr fluid                                   restriction:    >850     Allergies as of 12/09/2020   No Known Allergies      Medication List        Accurate as of December 09, 2020  4:37 PM. If you have any questions, ask your nurse or doctor.          Accu-Chek FastClix Lancets Misc USE AS INSTRUCTED TO CHECK BLOOD SUGAR 2 TIMES DAILY.   Accu-Chek Guide Me w/Device Kit by Does not apply route.   Accu-Chek Guide test strip Generic drug: glucose blood Use as instructed to check blood sugar 2 times daily.   allopurinol 100 MG  tablet Commonly known as: ZYLOPRIM Take 1 tablet (100 mg total) by mouth daily.   aspirin 81 MG EC tablet Take 1 tablet (81 mg total) by mouth daily.   B-D ULTRAFINE III SHORT PEN 31G X 8 MM Misc Generic drug: Insulin Pen Needle SMARTSIG:Pre-Filled Pen Syringe SUB-Q Daily   blood glucose meter kit and supplies Dispense based on patient and insurance preference. Use up to four times daily as directed. (FOR ICD-9 250.00, 250.01).   carvedilol 25 MG  tablet Commonly known as: COREG Take 25 mg by mouth 2 (two) times daily with a meal.   Dexcom G6 Receiver Devi Use to check blood sugars daily   Dexcom G6 Transmitter Misc Change every 90 days   digoxin 0.125 MG tablet Commonly known as: LANOXIN Take 125 mcg by mouth daily.   doxycycline 100 MG tablet Commonly known as: VIBRA-TABS Take by mouth.   Entresto 49-51 MG Generic drug: sacubitril-valsartan TK 1 T PO BID   FreeStyle Libre 2 Sensor Misc USE DEVICES EVERY 14 DAYS   Dexcom G6 Sensor Misc Use to monitor blood sugar, change after 10 days   furosemide 40 MG tablet Commonly known as: LASIX Take 1 tablet (40 mg total) by mouth daily. What changed: how much to take   HumaLOG KwikPen 100 UNIT/ML KwikPen Generic drug: insulin lispro ADMINISTER 10 TO 15 UNITS UNDER THE SKIN THREE TIMES DAILY   multivitamin with minerals Tabs tablet Take 1 tablet by mouth daily.   Ozempic (1 MG/DOSE) 4 MG/3ML Sopn Generic drug: Semaglutide (1 MG/DOSE) INJECT 1MG UNDER THE SKIN ONCE A WEEK   sildenafil 100 MG tablet Commonly known as: VIAGRA   simvastatin 40 MG tablet Commonly known as: ZOCOR Take 1 tablet (40 mg total) by mouth daily.   tadalafil 20 MG tablet Commonly known as: CIALIS Take 20 mg by mouth 2 (two) times a week.   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   Erectile dysfunction    Multifactorial. Improved with Cialis. New onset.   H/O folliculitis    Heartburn    Herpes exposure    History of herpetic exposure   Hyperlipidemia    Hypertension    variable in nature   Indigestion    Mild obesity    Multiple excoriations    multifactorial in origin   Situational stress    secondary to financial issues   Tobacco abuse     Past Surgical History:  Procedure Laterality Date   I & D EXTREMITY Left 06/28/2014   Procedure: IRRIGATION AND DEBRIDEMENT EXTREMITY;  Surgeon: Leanora Cover, MD;  Location: [place];  Service: Orthopedics;  Laterality:  Left;   INCISION AND DRAINAGE PERIRECTAL ABSCESS N/A 02/17/2014   Procedure: IRRIGATION AND DEBRIDEMENT PERIRECTAL  AND SCROTAL ABSCESS;  Surgeon: Coralie Keens, MD;  Location: [place];  Service: General;  Laterality: N/A;   MINOR IRRIGATION AND DEBRIDEMENT OF WOUND Left 06/20/2015   Procedure: IRRIGATION AND DEBRIDEMENT Wound with nailbed repair;  Surgeon: Leanora Cover, MD;  Location: [place];  Service: Orthopedics;  Laterality: Left;    Family History  Problem Relation Age of Onset   Diabetes type II Mother    Hypertension Mother    Diabetes Other     Social History:  reports that he has been smoking cigarettes. He has a 20.00 pack-year smoking history. He has never used smokeless tobacco. He reports current alcohol use of about 12.0 standard drinks per week. He reports that he does not use drugs.   Review of Systems   Lipid history: Treated by PCP with simvastatin, has mixed hyperlipidemia    Lab Results  Component Value Date   CHOL 128 02/19/2020   HDL 27.20 (L) 02/19/2020   LDLCALC 96 09/23/2013   LDLDIRECT 68.0 02/19/2020   TRIG 236.0 (H) 02/19/2020   CHOLHDL 5 02/19/2020           Hypertension: Has been treated with carvedilol and  Entresto Followed by cardiologist Dr. Terrence Dupont Blood pressure data:  BP Readings from Last 3 Encounters:  12/09/20 130/78  11/03/20 138/82  10/23/20 135/80     Most recent foot exam: 12/19  Currently known complications of diabetes: Erectile dysfunction, minimal neuropathy, microalbuminuria  CKD: Not followed by nephrologist, creatinine levels as follows, most recent creatinine 2.4  Lab Results  Component Value Date   CREATININE 2.28 (H) 10/23/2020   CREATININE 2.13 (H) 07/23/2020   CREATININE 1.73 (H) 04/14/2020   HYPERKALEMIA: Resolved  Lab Results  Component Value Date   K 4.9 10/23/2020    HYPONATREMIA: His sodium is persistently low but asymptomatic  Not on diuretics like HCTZ, only on Lasix daily No leg edema Urine osmolality on 01/17/2020 was high at 433 but subsequently was 242 in 7/22  Sodium appears improved and was 131 on 7/25 done by nephrologist   Lab Results  Component Value Date   NA 121 (LL) 10/23/2020   K 4.9 10/23/2020   CL 86 (L) 10/23/2020   CO2 28 10/23/2020   Lab Results  Component Value Date   TSH 3.81 10/23/2020    LABS:  No visits with results within 1 Week(s) from this visit.  Latest known visit with results is:  Office Visit on 10/23/2020  Component Date Value Ref Range Status   Sodium, Ur 10/23/2020 31  Not Estab. mmol/L Final   Free T4 10/23/2020 1.10  0.60 - 1.60 ng/dL Final   Comment: Specimens from patients who are undergoing biotin therapy and /or ingesting biotin supplements may contain high levels of biotin.  The higher biotin concentration in these specimens interferes with this Free T4 assay.  Specimens that contain high levels  of biotin may cause false high results for this Free T4 assay.  Please interpret results in light of the total clinical presentation of the patient.     TSH 10/23/2020 3.81  0.35 - 5.50 uIU/mL Final   Osmolality, Ur 10/23/2020 242  mOsmol/kg Final   Comment:  24  hr :   300 -  900                                   Random:    50 - 1400                                   After 12hr fluid                                   restriction:    >850     Lab Results  Component Value Date   TSH 3.81 10/23/2020    Physical Examination:  BP 130/78   Pulse 88   Ht _0  (1.803 m)   Wt (!) 311 lb 9.6 oz (141.3 kg)   SpO2 99%   BMI 43.46 kg/m       ASSESSMENT:  Diabetes type 2 on insulin  See history of present illness for detailed discussion of current diabetes management, blood sugar patterns and problems identified  His A1c is previously higher at 9.6  He is on basal bolus insulin and Ozempic 1 mg  His blood sugars are 67% within target range recently and he is doing relatively better with average 159 Also has more accurate readings with the use of the Dexcom sensor  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 but his last urine osmolality was not high and sodium fluctuates May be influenced by his renal dysfunction and level of edema  CKD: To be followed by nephrologist  Venous edema of the leg: Encouraged him to use compression stockings   PLAN:    He will try to remember to take his insulin with him consistently at lunchtime He will also use a cooler to keep it cool in summer months since he leaves it in the car He will go up to 24 units if eating larger meals or more carbohydrates To call Dexcom or the nurse educator if he has issues with the Dexcom sensor If he has consistently higher overnight readings may consider increasing his Tyler Aas but currently no consistent pattern seen Discussed avoiding juice at night at bedtime and also fruits like watermelon and grapes Consider follow-up with dietitian or diabetes educator May consider increasing Ozempic and when this is available but also can be a candidate for [facility] if next A1c is still high  Patient Instructions  Take 24 Units at lunch for more starchy or larger meals  No juice or sweet  fruits     Elayne Snare 12/09/2020, 4:37 PM   Note: This office note was prepared with Dragon voice recognition system technology. Any transcriptional errors that result from this process are unintentional.

## 2020-12-09 NOTE — Patient Instructions (Signed)
Take 24 Units at lunch for more starchy or larger meals  No juice or sweet fruits

## 2020-12-10 ENCOUNTER — Other Ambulatory Visit: Payer: Self-pay | Admitting: Podiatry

## 2020-12-10 DIAGNOSIS — R600 Localized edema: Secondary | ICD-10-CM

## 2020-12-24 ENCOUNTER — Encounter (HOSPITAL_COMMUNITY): Payer: Self-pay | Admitting: Emergency Medicine

## 2020-12-24 ENCOUNTER — Other Ambulatory Visit: Payer: Self-pay

## 2020-12-24 ENCOUNTER — Ambulatory Visit (HOSPITAL_COMMUNITY)
Admission: EM | Admit: 2020-12-24 | Discharge: 2020-12-24 | Disposition: A | Discharge status: Home / Self Care | Payer: 59 | Attending: Medical Oncology | Admitting: Medical Oncology

## 2020-12-24 DIAGNOSIS — M940 Chondrocostal junction syndrome [Tietze]: Secondary | ICD-10-CM | POA: Diagnosis not present

## 2020-12-24 MED ORDER — LIDOCAINE 5 % EX PTCH: 1.0000 | MEDICATED_PATCH | CUTANEOUS | 0 refills | Status: DC

## 2020-12-24 NOTE — ED Provider Notes (Signed)
[facility]    CSN: [id number] Arrival date & time: 12/24/20  0843      History   Chief Complaint Chief Complaint  Patient presents with   rib pain    HPI Cooper Moroney is a 50 y.o. male.   HPI  Rib Pain: Pt reports that he awoke this morning and noticed some pain of his right lower ribcage. To him he reports that it feels like a pulled muscle. No known injury or trauma. He does report having a "smokers cough" which has been unchanged. He denies fever, new cough, SOB, chest pain, abdominal pain, nausea, vomiting, diarrhea, wheezing, hemoptysis, productive cough. He has not tried anything for symptoms. Course is complicated by uncontrolled DM2, CHF, COPD, CKD.   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   Erectile dysfunction    Multifactorial. Improved with Cialis. New onset.   H/O folliculitis    Heartburn    Herpes exposure    History of herpetic exposure   Hyperlipidemia    Hypertension    variable in nature   Indigestion    Mild obesity    Multiple excoriations    multifactorial in origin   Situational stress    secondary to financial issues   Tobacco abuse     Patient Active Problem List   Diagnosis Date Noted   Otorrhea of right ear 12/30/2017   Perichondritis of auricle, right 12/30/2017   Acute deep vein thrombosis (DVT) of distal vein of right lower extremity ([place]) 05/25/2017   Felon of  finger of left hand 09/01/2015   Paronychia of left index finger 09/01/2015   Allergic reaction 08/18/2015   Hyperkalemia 08/18/2015   Hyperglycemia 08/10/2014   Hyponatremia 08/10/2014   AKI (acute kidney injury) ([place]) 67/03/4579   Chronic systolic CHF (congestive heart failure) ([place]) 08/10/2014   Asthma 08/10/2014   Hand abscess 06/28/2014   Abscess of left hand 06/27/2014   CHF, acute on chronic ([place]) 04/06/2014   DM type 2 (diabetes mellitus, type 2) ([place]) 04/06/2014   Scrotal wall abscess 02/15/2014   Scrotal abscess 10/31/2013   Alcohol abuse 10/31/2013   Pulmonary edema 09/22/2013   Acute on chronic systolic heart failure ([place]) 09/22/2013   Tobacco abuse 09/22/2013   HTN (hypertension) 05/19/2012   Situational stress 05/19/2012   Uncontrolled diabetes mellitus ([place]) 05/19/2012    Past Surgical History:  Procedure Laterality Date   I & D EXTREMITY Left 06/28/2014   Procedure: IRRIGATION AND DEBRIDEMENT EXTREMITY;  Surgeon: Leanora Cover, MD;  Location: [facility];  Service: Orthopedics;  Laterality: Left;   INCISION AND DRAINAGE PERIRECTAL ABSCESS N/A 02/17/2014   Procedure: IRRIGATION AND DEBRIDEMENT PERIRECTAL  AND SCROTAL ABSCESS;  Surgeon: Coralie Keens, MD;  Location: [facility];  Service: General;  Laterality: N/A;   MINOR IRRIGATION AND DEBRIDEMENT OF  WOUND Left 06/20/2015   Procedure: IRRIGATION AND DEBRIDEMENT Wound with nailbed repair;  Surgeon: Leanora Cover, MD;  Location: [facility];  Service: Orthopedics;  Laterality: Left;       Home Medications    Prior to Admission medications   Medication Sig Start Date End Date Taking? Authorizing Provider  ACCU-CHEK FASTCLIX LANCETS MISC USE AS INSTRUCTED TO CHECK BLOOD SUGAR 2 TIMES DAILY. 05/30/18   Elayne Snare, MD  allopurinol (ZYLOPRIM) 100 MG tablet Take 1 tablet (100 mg total) by mouth daily. 10/10/18   Trula Slade, DPM  aspirin EC 81 MG EC tablet Take 1 tablet (81 mg total) by mouth daily. 09/23/13    Emokpae, Leanne Chang, MD  B-D ULTRAFINE III SHORT PEN 31G X 8 MM MISC SMARTSIG:Pre-Filled Pen Syringe SUB-Q Daily 07/25/20   [provider]  blood glucose meter kit and supplies Dispense based on patient and insurance preference. Use up to four times daily as directed. (FOR ICD-9 250.00, 250.01). 08/12/14   Velvet Bathe, MD  Blood Glucose Monitoring Suppl (ACCU-CHEK GUIDE ME) w/Device KIT by Does not apply route.    [provider]  carvedilol (COREG) 25 MG tablet Take 25 mg by mouth 2 (two) times daily with a meal.    [provider]  Continuous Blood Gluc Receiver (DEXCOM G6 RECEIVER) DEVI Use to check blood sugars daily 10/09/20   Elayne Snare, MD  Continuous Blood Gluc Sensor (DEXCOM G6 SENSOR) MISC Use to monitor blood sugar, change after 10 days 08/12/20   Elayne Snare, MD  Continuous Blood Gluc Sensor (FREESTYLE LIBRE 2 SENSOR) MISC USE DEVICES EVERY 14 DAYS 08/03/20   Elayne Snare, MD  Continuous Blood Gluc Transmit (DEXCOM G6 TRANSMITTER) MISC Change every 90 days 10/09/20   Elayne Snare, MD  digoxin (LANOXIN) 0.125 MG tablet Take 125 mcg by mouth daily. 07/19/20   [provider]  doxycycline (VIBRA-TABS) 100 MG tablet Take by mouth. Patient not taking: Reported on 12/24/2020 08/15/20   [provider]  ENTRESTO 49-51 MG TK 1 T PO BID 10/11/17   [provider]  furosemide (LASIX) 40 MG tablet Take 1 tablet (40 mg total) by mouth daily. Patient taking differently: Take 80 mg by mouth daily. 04/08/14   Thurnell Lose, MD  glucose blood (ACCU-CHEK GUIDE) test strip Use as instructed to check blood sugar 2 times daily. 05/09/19   Elayne Snare, MD  HUMALOG KWIKPEN 100 UNIT/ML KwikPen ADMINISTER 10 TO 15 UNITS UNDER THE SKIN THREE TIMES DAILY 11/10/20   Elayne Snare, MD  Multiple Vitamin (MULTIVITAMIN WITH MINERALS) TABS tablet Take 1 tablet by mouth daily.    [provider]  OZEMPIC, 1 MG/DOSE, 4 MG/3ML SOPN INJECT 1MG UNDER THE SKIN ONCE A WEEK  10/03/20   Elayne Snare, MD  sildenafil (VIAGRA) 100 MG tablet  10/04/17   [provider]  simvastatin (ZOCOR) 40 MG tablet Take 1 tablet (40 mg total) by mouth daily. 09/23/13   Emokpae, Ejiroghene E, MD  tadalafil (CIALIS) 20 MG tablet Take 20 mg by mouth 2 (two) times a week. 05/14/20   [provider]  TRESIBA FLEXTOUCH 200 UNIT/ML FlexTouch Pen ADMINISTER 90 UNITS UNDER THE SKIN EVERY DAY 10/29/20   Elayne Snare, MD    Family History Family History  Problem Relation Age of Onset   Diabetes type II Mother    Hypertension Mother    Diabetes Other     Social History Social History   Tobacco Use  Smoking status: Every Day    Packs/day: 1.00    Years: 20.00    Pack years: 20.00    Types: Cigarettes   Smokeless tobacco: Never  Vaping Use   Vaping Use: Never used  Substance Use Topics   Alcohol use: Not Currently    Alcohol/week: 12.0 standard drinks    Types: 10 Cans of beer, 2 Shots of liquor per week    Comment: As of 04/06/14 - states occasional use   Drug use: No     Allergies   Patient has no known allergies.   Review of Systems Review of Systems  As stated above in HPI Physical Exam Triage Vital Signs ED Triage Vitals  Enc Vitals Group     BP 12/24/20 0938 133/75     Pulse Rate 12/24/20 0938 77     Resp 12/24/20 0938 (!) 22     Temp 12/24/20 0938 98.3 F (36.8 C)     Temp Source 12/24/20 0938 Oral     SpO2 12/24/20 0938 100 %     Weight --      Height --      Head Circumference --      Peak Flow --      Pain Score 12/24/20 0935 10     Pain Loc --      Pain Edu? --      Excl. in [place]? --    No data found.  Updated Vital Signs BP 133/75 (BP Location: Left Arm) Comment (BP Location): large cuff  Pulse 77   Temp 98.3 F (36.8 C) (Oral)   Resp (!) 22   SpO2 100%   Physical Exam Vitals and nursing note reviewed.  Constitutional:      General: He is not in acute distress.    Appearance: Normal appearance. He is obese. He is not  ill-appearing, toxic-appearing or diaphoretic.  HENT:     Head: Normocephalic and atraumatic.     Nose: Nose normal.     Mouth/Throat:     Mouth: Mucous membranes are moist.  Cardiovascular:     Rate and Rhythm: Normal rate and regular rhythm.     Heart sounds: Normal heart sounds.     Comments: Mild reproducible tenderness over the right lower ribcage. NO evidence of bruising, edema or petechia.  Pulmonary:     Effort: Pulmonary effort is normal. No respiratory distress.     Breath sounds: Normal breath sounds. No stridor. No wheezing, rhonchi or rales.  Chest:     Chest wall: No tenderness.  Abdominal:     General: Bowel sounds are normal. There is no distension.     Palpations: Abdomen is soft. There is no mass.     Tenderness: There is no abdominal tenderness. There is no right CVA tenderness, left CVA tenderness, guarding or rebound.     Hernia: No hernia is present.  Skin:    General: Skin is warm.     Findings: No rash.  Neurological:     Mental Status: He is alert and oriented to person, place, and time.     UC Treatments / Results  Labs (all labs ordered are listed, but only abnormal results are displayed) Labs Reviewed - No data to display  EKG   Radiology No results found.  Procedures Procedures (including critical care time)  Medications Ordered in UC Medications - No data to display  Initial Impression / Assessment and Plan / UC Course  I have reviewed the triage vital  signs and the nursing notes.  Pertinent labs & imaging results that were available during my care of the patient were reviewed by me and considered in my medical decision making (see chart for details).     New.  Appears to be costochondritis which I discussed with patient.  He is in agreement.  For now we are going to trial lidocaine patches, apple cider vinegar turmeric for his discomfort.  We discussed costochondritis including how long this clinical course typically lasts and red  flag signs and symptoms to watch for that would indicate further work-up is indicated.  Follow-up as needed. Final Clinical Impressions(s) / UC Diagnoses   Final diagnoses:  None   Discharge Instructions   None    ED Prescriptions   None    PDMP not reviewed this encounter.   Hughie Closs, [place] 12/24/20 1132

## 2020-12-24 NOTE — Discharge Instructions (Addendum)
Apple Cider Vinegar and Tumeric are natural antiinflammatory products that can be taken orally.

## 2020-12-24 NOTE — ED Triage Notes (Signed)
Patient reports right rib cage pain noticed when he coughed this morning.  Patient reports coughing makes this pain worse since onset

## 2020-12-26 ENCOUNTER — Other Ambulatory Visit: Payer: Self-pay

## 2020-12-26 ENCOUNTER — Ambulatory Visit (INDEPENDENT_AMBULATORY_CARE_PROVIDER_SITE_OTHER): Payer: 59 | Admitting: Family

## 2020-12-26 ENCOUNTER — Encounter: Payer: Self-pay | Admitting: Family

## 2020-12-26 ENCOUNTER — Ambulatory Visit: Payer: Self-pay

## 2020-12-26 DIAGNOSIS — M25571 Pain in right ankle and joints of right foot: Secondary | ICD-10-CM

## 2020-12-26 NOTE — Progress Notes (Signed)
Office Visit Note   Patient: Austin Alexander           Date of Birth: 1970-05-16           MRN: [id number] Visit Date: 12/26/2020              Requested by: Rogers Blocker, MD [address] [place],  [place] [id number] PCP: Rogers Blocker, MD  Chief Complaint  Patient presents with   Right Ankle - Pain      HPI: The patient is a 50 year old gentleman who presents today for complaint of right ankle swelling.  He reports its been ongoing for about a month.  States this is worse with activity it resolves overnight.  The longer he is on his feet especially on days that he works the swelling is worse and more painful.  He has been icing and attempting to elevate.  He states that he has tried compression garments but can only tolerate these for a few hours before they become too painful  Does have a history of diabetes and congestive heart failure.  States she is on Lasix has been on Lasix prior to issues with edema.  Taking 40 mg daily.  He is also been seen by podiatry for this issue.  He has not had any injuries to the ankle no change in his medication  Assessment & Plan: Visit Diagnoses:  1. Pain in right ankle and joints of right foot     Plan: Today discussed the importance of compression and elevation discussed natural course of venous insufficiency.  Has not had any medication changes the could be causing this edema that I am aware of.  He would like to be out of work for 2 weeks to work on getting the swelling under control and note has been provided  Follow-Up Instructions: Return in about 17 days (around 01/12/2021).   Right Ankle Exam  Right ankle exam is normal. Swelling: severe      Patient is alert, oriented, no adenopathy, well-dressed, normal affect, normal respiratory effort. Pitting edema up to mid shin on right. No erythema or warmth. No weeping. No ulcers.  Imaging: XR Ankle Complete Right  Result Date: 12/26/2020 Radiographs of right ankle are  without bony abnormality.  No images are attached to the encounter.  Labs: Lab Results  Component Value Date   HGBA1C 9.6 (H) 10/23/2020   HGBA1C 8.2 (H) 07/23/2020   HGBA1C 7.5 (H) 04/14/2020   ESRSEDRATE 82 (H) 11/20/2018   ESRSEDRATE 69 (H) 10/06/2018   ESRSEDRATE 87 (H) 09/20/2018   CRP 81 (H) 11/20/2018   CRP 37 (H) 10/06/2018   CRP 63 (H) 09/20/2018   LABURIC 7.5 11/20/2018   LABURIC 8.5 10/06/2018   LABURIC 9.9 (H) 09/20/2018   REPTSTATUS 12/13/2017 FINAL 12/11/2017   GRAMSTAIN  12/11/2017    RARE WBC PRESENT,BOTH PMN AND MONONUCLEAR RARE GRAM POSITIVE COCCI    CULT  12/11/2017    RARE NORMAL SKIN FLORA Performed at [facility], [place] [address], [place], [place] [address]      Lab Results  Component Value Date   ALBUMIN 3.4 (L) 10/23/2020   ALBUMIN 3.3 (L) 07/20/2019   ALBUMIN 3.5 05/04/2019    Lab Results  Component Value Date   MG 2.1 08/18/2015   No results found for: VD25OH  No results found for: PREALBUMIN CBC EXTENDED Latest Ref Rng & Units 11/20/2018 10/06/2018 09/20/2018  WBC 3.4 - 10.8 x10E3/uL 9.9 8.8 12.6(H)  RBC 4.14 - 5.80 x10E6/uL 3.78(L) 4.01(L) 3.85(L)  HGB 13.0 - 17.7 g/dL 9.5(L) 10.4(L) 10.1(L)  HCT 37.5 - 51.0 % 29.3(L) 31.4(L) 31.1(L)  PLT 150 - 450 x10E3/uL 437 406 452(H)  NEUTROABS 1.4 - 7.0 x10E3/uL 7.2(H) 6.1 9.7(H)  LYMPHSABS 0.7 - 3.1 x10E3/uL 1.2 1.4 1.4     There is no height or weight on file to calculate BMI.  Orders:  Orders Placed This Encounter  Procedures   XR Ankle Complete Right   No orders of the defined types were placed in this encounter.    Procedures: No procedures performed  Clinical Data: No additional findings.  ROS:  All other systems negative, except as noted in the HPI. Review of Systems  Objective: Vital Signs: There were no vitals taken for this visit.  Specialty Comments:  No specialty comments available.  PMFS History: Patient Active Problem List   Diagnosis Date Noted    Otorrhea of right ear 12/30/2017   Perichondritis of auricle, right 12/30/2017   Acute deep vein thrombosis (DVT) of distal vein of right lower extremity ([place]) 05/25/2017   Felon of finger of left hand 09/01/2015   Paronychia of left index finger 09/01/2015   Allergic reaction 08/18/2015   Hyperkalemia 08/18/2015   Hyperglycemia 08/10/2014   Hyponatremia 08/10/2014   AKI (acute kidney injury) ([place]) [id number]   Chronic systolic CHF (congestive heart failure) ([place]) 08/10/2014   Asthma 08/10/2014   Hand abscess 06/28/2014   Abscess of left hand 06/27/2014   CHF, acute on chronic ([place]) 04/06/2014   DM type 2 (diabetes mellitus, type 2) ([place]) 04/06/2014   Scrotal wall abscess 02/15/2014   Scrotal abscess 10/31/2013   Alcohol abuse 10/31/2013   Pulmonary edema 09/22/2013   Acute on chronic systolic heart failure ([place]) 09/22/2013   Tobacco abuse 09/22/2013   HTN (hypertension) 05/19/2012   Situational stress 05/19/2012   Uncontrolled diabetes mellitus ([place]) 05/19/2012   Past Medical History:  Diagnosis Date   Abscess    Multiple facial abscesses, which has progressed   Abscess of perineum    Alcohol abuse    Asthma    History of,   Bronchitis    CHF (congestive heart failure) (HCC)    COPD (chronic obstructive pulmonary disease) ([place])    moderate obstruction with low vital capacity   Croup    Cystic acne    with acne keloidosis   Diabetes mellitus without complication (HCC)    Type 2, uncontrolled with questionable improvement   Dietary noncompliance    History of,   Dizziness    EKG, abnormal    History of, with negative cardiac evaluation by history.   Erectile dysfunction    Multifactorial. Improved with Cialis. New onset.   H/O folliculitis    Heartburn    Herpes exposure    History of herpetic exposure   Hyperlipidemia    Hypertension    variable in nature   Indigestion    Mild obesity    Multiple excoriations    multifactorial in origin   Situational  stress    secondary to financial issues   Tobacco abuse     Family History  Problem Relation Age of Onset   Diabetes type II Mother    Hypertension Mother    Diabetes Other     Past Surgical History:  Procedure Laterality Date   I & D EXTREMITY Left 06/28/2014   Procedure: IRRIGATION AND DEBRIDEMENT EXTREMITY;  Surgeon: Leanora Cover, MD;  Location: [facility];  Service:  Orthopedics;  Laterality: Left;   INCISION AND DRAINAGE PERIRECTAL ABSCESS N/A 02/17/2014   Procedure: IRRIGATION AND DEBRIDEMENT PERIRECTAL  AND SCROTAL ABSCESS;  Surgeon: Coralie Keens, MD;  Location: [facility];  Service: General;  Laterality: N/A;   MINOR IRRIGATION AND DEBRIDEMENT OF WOUND Left 06/20/2015   Procedure: IRRIGATION AND DEBRIDEMENT Wound with nailbed repair;  Surgeon: Leanora Cover, MD;  Location: [facility];  Service: Orthopedics;  Laterality: Left;   Social History   Occupational History   Not on file  Tobacco Use   Smoking status: Every Day    Packs/day: 1.00    Years: 20.00    Pack years: 20.00    Types: Cigarettes   Smokeless tobacco: Never  Vaping Use   Vaping Use: Never used  Substance and Sexual Activity   Alcohol use: Not Currently    Alcohol/week: 12.0 standard drinks    Types: 10 Cans of beer, 2 Shots of liquor per week    Comment: As of 04/06/14 - states occasional use   Drug use: No   Sexual activity: Yes

## 2021-01-09 ENCOUNTER — Ambulatory Visit: Payer: 59 | Admitting: Family

## 2021-01-20 ENCOUNTER — Other Ambulatory Visit: Payer: Self-pay

## 2021-01-20 ENCOUNTER — Ambulatory Visit: Payer: 59 | Admitting: Podiatry

## 2021-01-20 ENCOUNTER — Encounter: Payer: Self-pay | Admitting: Podiatry

## 2021-01-20 DIAGNOSIS — Q828 Other specified congenital malformations of skin: Secondary | ICD-10-CM | POA: Diagnosis not present

## 2021-01-20 DIAGNOSIS — M79609 Pain in unspecified limb: Secondary | ICD-10-CM

## 2021-01-20 DIAGNOSIS — B351 Tinea unguium: Secondary | ICD-10-CM | POA: Diagnosis not present

## 2021-01-20 DIAGNOSIS — E0842 Diabetes mellitus due to underlying condition with diabetic polyneuropathy: Secondary | ICD-10-CM | POA: Diagnosis not present

## 2021-01-20 NOTE — Progress Notes (Signed)
Subjective: 50 year old male presents the office today for diabetic foot evaluation and have his nails trimmed as they are thickened elongated he has difficulty doing them they cause discomfort.  No open lesions he reports.  Since I last saw him he states he is also been seen by Dr. Gershon Mussel and he was placed into a CAM boot on the right side.  Still gets swelling.  No injury or changes otherwise.  Last A1c was up to 9.6 on October 23, 2020   Objective: AAO x3, NAD DP/PT pulses palpable bilaterally, CRT less than 3 seconds Protective sensation decreased with Simms Weinstein monofilament Nails are hypertrophic, dystrophic, brittle, discolored, elongated 10. No surrounding redness or drainage. Tenderness nails 1-5 bilaterally.  There is a new hyperkeratotic lesion on the left lateral foot in linear fashion.  No underlying ulceration drainage or any signs of infection noted.   No open lesions. Chronic edema present to the right foot, ankle.  There is no erythema or warmth associated.  There is no open lesions or any drainage.  No significant tenderness to palpation with associated edema. No pain with calf compression, swelling, warmth, erythema  Assessment: Uncontrolled type 2 diabetes with neuropathy; symptomatic onychomycosis; right lower extremity edema  Plan: -All treatment options discussed with the patient including all alternatives, risks, complications.  -For the right side encourage compression socks, limit salt intake and elevation.  He is also being seen by Dr. Gershon Mussel for this. Hervey Ard debrided nails [id number] certain complications or bleeding. -Sharply debrided the hyperkeratotic lesion left foot x1 without any complications or bleeding. -Discussed daily foot inspection,  Glucose control  Return in about 3 months (around 04/22/2021).  Trula Slade DPM

## 2021-01-26 ENCOUNTER — Other Ambulatory Visit: Payer: Self-pay | Admitting: Endocrinology

## 2021-01-27 ENCOUNTER — Other Ambulatory Visit: Payer: Self-pay | Admitting: Endocrinology

## 2021-01-28 ENCOUNTER — Telehealth: Payer: Self-pay | Admitting: Pharmacy Technician

## 2021-01-28 ENCOUNTER — Other Ambulatory Visit (HOSPITAL_COMMUNITY): Payer: Self-pay

## 2021-01-28 NOTE — Telephone Encounter (Signed)
Patient Advocate Encounter   Received notification from CoverMyMeds that prior authorization for Ozempic is required.  Per test claim, ins accepts claim with a $0 copay. No PA needed at this time.

## 2021-02-05 ENCOUNTER — Other Ambulatory Visit (INDEPENDENT_AMBULATORY_CARE_PROVIDER_SITE_OTHER): Payer: 59

## 2021-02-05 ENCOUNTER — Other Ambulatory Visit: Payer: Self-pay

## 2021-02-05 DIAGNOSIS — E1165 Type 2 diabetes mellitus with hyperglycemia: Secondary | ICD-10-CM

## 2021-02-05 DIAGNOSIS — E7800 Pure hypercholesterolemia, unspecified: Secondary | ICD-10-CM | POA: Diagnosis not present

## 2021-02-05 DIAGNOSIS — Z794 Long term (current) use of insulin: Secondary | ICD-10-CM

## 2021-02-05 LAB — LDL CHOLESTEROL, DIRECT: Direct LDL: 40 mg/dL

## 2021-02-05 LAB — LIPID PANEL
Cholesterol: 131 mg/dL (ref 0–200)
HDL: 22 mg/dL — ABNORMAL LOW (ref >39.00)
Total CHOL/HDL Ratio: 6
Triglycerides: 468 mg/dL — ABNORMAL HIGH (ref 0.0–149.0)

## 2021-02-05 LAB — COMPREHENSIVE METABOLIC PANEL
ALT: 10 U/L (ref 0–53)
AST: 12 U/L (ref 0–37)
Albumin: 3.5 g/dL (ref 3.5–5.2)
Alkaline Phosphatase: 97 U/L (ref 39–117)
BUN: 33 mg/dL — ABNORMAL HIGH (ref 6–23)
CO2: 32 mEq/L (ref 19–32)
Calcium: 9.5 mg/dL (ref 8.4–10.5)
Chloride: 95 mEq/L — ABNORMAL LOW (ref 96–112)
Creatinine, Ser: 2.11 mg/dL — ABNORMAL HIGH (ref 0.40–1.50)
GFR: 35.32 mL/min — ABNORMAL LOW (ref >60.00)
Glucose, Bld: 251 mg/dL — ABNORMAL HIGH (ref 70–99)
Potassium: 4.3 mEq/L (ref 3.5–5.1)
Sodium: 133 mEq/L — ABNORMAL LOW (ref 135–145)
Total Bilirubin: 0.4 mg/dL (ref 0.2–1.2)
Total Protein: 8 g/dL (ref 6.0–8.3)

## 2021-02-05 LAB — HEMOGLOBIN A1C: Hgb A1c MFr Bld: 7.5 % — ABNORMAL HIGH (ref 4.6–6.5)

## 2021-02-06 ENCOUNTER — Other Ambulatory Visit: Payer: 59

## 2021-02-07 ENCOUNTER — Other Ambulatory Visit: Payer: Self-pay | Admitting: Endocrinology

## 2021-02-10 ENCOUNTER — Telehealth: Payer: Self-pay | Admitting: Endocrinology

## 2021-02-10 ENCOUNTER — Ambulatory Visit (INDEPENDENT_AMBULATORY_CARE_PROVIDER_SITE_OTHER): Payer: 59 | Admitting: Endocrinology

## 2021-02-10 ENCOUNTER — Other Ambulatory Visit: Payer: Self-pay

## 2021-02-10 VITALS — BP 158/86 | HR 91 | Ht 71.0 in | Wt 293.0 lb

## 2021-02-10 DIAGNOSIS — E1165 Type 2 diabetes mellitus with hyperglycemia: Secondary | ICD-10-CM

## 2021-02-10 DIAGNOSIS — Z794 Long term (current) use of insulin: Secondary | ICD-10-CM | POA: Diagnosis not present

## 2021-02-10 DIAGNOSIS — E782 Mixed hyperlipidemia: Secondary | ICD-10-CM

## 2021-02-10 MED ORDER — ICOSAPENT ETHYL 1 G PO CAPS: 2.0000 g | ORAL_CAPSULE | Freq: Two times a day (BID) | ORAL | 2 refills | Status: DC

## 2021-02-10 NOTE — Progress Notes (Signed)
Patient ID: Austin Alexander, male   DOB: 11-06-1970, 50 y.o.   MRN: [id number]           Reason for Appointment: Follow-up for Type 2 Diabetes   History of Present Illness:          Date of diagnosis of type 2 diabetes mellitus:  2009      Background history:   He is not clear when he was first diagnosed with diabetes Had been previously taking metformin and also for a few years Amaryl and Actos Insulin probably started in 2015 according to records and his A1c has been as high as 15.2 in the past He thinks Trulicity was added about a year ago At some point his A1c apparently has been 7-8% but does not know when  Recent history:    INSULIN regimen is: [place] 90 units daily, Humalog 20 units at lunch and dinner   Non-insulin hypoglycemic drugs the patient is taking are: Ozempic 1 mg weekly, Jardiance 10 mg daily  His A1c is back down to 7.5 from 9.6  Current management, blood sugar patterns and problems identified: Blood sugars may be improved by adding Jardiance from his cardiologist  However on his last visit he was also advised to cut back on juices and fruits especially at night  He also has been able to take his insulin more consistently to cover his lunch and is taking it with him Although his blood sugars are being checked with the Dexcom he has only a week of blood sugars recently and only started back last night Also his time is programmed 12 hours behind He is usually applying the Dexcom sensor to his abdomen as he does not think it lasts as long as Mounjaro Blood sugars seem to fluctuate significantly between about 4 PM-8 PM likely based on his diet and activity level At times blood sugars are relatively lower from not balancing his insulin and food at lunchtime However overnight blood sugars appear to be quite stable and in a good range He has continued his Ozempic regularly Weight appears to be decreasing further        Side effects from medications have been:  None  Typical meal intake: Breakfast is frequently skipped, lunch is a largest meal          DEXCOM 6 download shows the following for the week after October  Overall blood sugars are averaging within the normal range consistently He appears to have significant artifacts in his tracings, unclear why He has occasionally low readings before dinner but also at times high readings after dinner Overnight blood sugars are stable below 150 average Blood sugar was higher last night for unknown reasons but improving by lunchtime today  CGM use % of time 50  2-week average/GV   Time in range 79     %  % Time Above 180 16  % Time above 250 1  % Time Below 70 5   Previously   CGM use % of time 64  2-week average/GV 159/41  Time in range       67 %  % Time Above 180 32  % Time above 250   % Time Below 70 <1     PRE-MEAL Fasting Lunch Dinner Bedtime Overall  Glucose range:       Averages: 55 158 146 190    POST-MEAL PC Breakfast PC Lunch PC Dinner  Glucose range:     Averages:  179 161  Dietician visit, most recent: years ago  Weight history:  Wt Readings from Last 3 Encounters:  02/10/21 293 lb (132.9 kg)  12/09/20 (!) 311 lb 9.6 oz (141.3 kg)  11/03/20 (!) 313 lb 6.4 oz (142.2 kg)    Glycemic control:   Lab Results  Component Value Date   HGBA1C 7.5 (H) 02/05/2021   HGBA1C 9.6 (H) 10/23/2020   HGBA1C 8.2 (H) 07/23/2020   Lab Results  Component Value Date   MICROALBUR 51.4 (H) 07/23/2020   LDLCALC 96 09/23/2013   CREATININE 2.11 (H) 02/05/2021   Lab Results  Component Value Date   MICRALBCREAT 110.7 (H) 07/23/2020    Lab Results  Component Value Date   FRUCTOSAMINE 217 02/19/2020   FRUCTOSAMINE 346 (H) 07/20/2019   FRUCTOSAMINE 348 (H) 06/21/2019    Lab on 02/05/2021  Component Date Value Ref Range Status   Cholesterol 02/05/2021 131  0 - 200 mg/dL Final   ATP III Classification       Desirable:  < 200 mg/dL               Borderline High:  200 -  239 mg/dL          High:  > = 240 mg/dL   Triglycerides 02/05/2021 468.0 Triglyceride is over 400; calculations on Lipids are invalid. (A)  0.0 - 149.0 mg/dL Final   Normal:  <150 mg/dLBorderline High:  150 - 199 mg/dL   HDL 02/05/2021 22.00 (A)  >39.00 mg/dL Final   Total CHOL/HDL Ratio 02/05/2021 6   Final                  Men          Women1/2 Average Risk     3.4          3.3Average Risk          5.0          4.42X Average Risk          9.6          7.13X Average Risk          15.0          11.0                       Sodium 02/05/2021 133 (A)  135 - 145 mEq/L Final   Potassium 02/05/2021 4.3  3.5 - 5.1 mEq/L Final   Chloride 02/05/2021 95 (A)  96 - 112 mEq/L Final   CO2 02/05/2021 32  19 - 32 mEq/L Final   Glucose, Bld 02/05/2021 251 (A)  70 - 99 mg/dL Final   BUN 02/05/2021 33 (A)  6 - 23 mg/dL Final   Creatinine, Ser 02/05/2021 2.11 (A)  0.40 - 1.50 mg/dL Final   Total Bilirubin 02/05/2021 0.4  0.2 - 1.2 mg/dL Final   Alkaline Phosphatase 02/05/2021 97  39 - 117 U/L Final   AST 02/05/2021 12  0 - 37 U/L Final   ALT 02/05/2021 10  0 - 53 U/L Final   Total Protein 02/05/2021 8.0  6.0 - 8.3 g/dL Final   Albumin 02/05/2021 3.5  3.5 - 5.2 g/dL Final   GFR 02/05/2021 35.32 (A)  >60.00 mL/min Final   Calculated using the CKD-EPI Creatinine Equation (2021)   Calcium 02/05/2021 9.5  8.4 - 10.5 mg/dL Final   Hgb A1c MFr Bld 02/05/2021 7.5 (A)  4.6 - 6.5 % Final   Glycemic Control Guidelines for  People with Diabetes:Non Diabetic:  <6%Goal of Therapy: <7%Additional Action Suggested:  >8%    Direct LDL 02/05/2021 40.0  mg/dL Final   Optimal:  <100 mg/dLNear or Above Optimal:  100-129 mg/dLBorderline High:  130-159 mg/dLHigh:  160-189 mg/dLVery High:  >190 mg/dL    Allergies as of 02/10/2021   No Known Allergies      Medication List        Accurate as of February 10, 2021 11:57 AM. If you have any questions, ask your nurse or doctor.          Accu-Chek FastClix Lancets Misc USE AS  INSTRUCTED TO CHECK BLOOD SUGAR 2 TIMES DAILY.   Accu-Chek Guide Me w/Device Kit by Does not apply route.   Accu-Chek Guide test strip Generic drug: glucose blood Use as instructed to check blood sugar 2 times daily.   allopurinol 100 MG tablet Commonly known as: ZYLOPRIM Take 1 tablet (100 mg total) by mouth daily.   aspirin 81 MG EC tablet Take 1 tablet (81 mg total) by mouth daily.   B-D ULTRAFINE III SHORT PEN 31G X 8 MM Misc Generic drug: Insulin Pen Needle SMARTSIG:Pre-Filled Pen Syringe SUB-Q Daily   blood glucose meter kit and supplies Dispense based on patient and insurance preference. Use up to four times daily as directed. (FOR ICD-9 250.00, 250.01).   carvedilol 25 MG tablet Commonly known as: COREG Take 25 mg by mouth 2 (two) times daily with a meal.   Dexcom G6 Receiver Devi Use to check blood sugars daily   Dexcom G6 Transmitter Misc Change every 90 days   digoxin 0.125 MG tablet Commonly known as: LANOXIN Take 125 mcg by mouth daily.   doxycycline 100 MG tablet Commonly known as: VIBRA-TABS Take by mouth.   Entresto 49-51 MG Generic drug: sacubitril-valsartan TK 1 T PO BID   FreeStyle Libre 2 Sensor Misc USE DEVICES EVERY 14 DAYS   Dexcom G6 Sensor Misc Use to monitor blood sugar, change after 10 days   furosemide 40 MG tablet Commonly known as: LASIX Take 1 tablet (40 mg total) by mouth daily. What changed: how much to take   HumaLOG KwikPen 100 UNIT/ML KwikPen Generic drug: insulin lispro ADMINISTER 10 TO 15 UNITS UNDER THE SKIN THREE TIMES DAILY   Jardiance 10 MG Tabs tablet Generic drug: empagliflozin Take 10 mg by mouth daily.   lidocaine 5 % Commonly known as: Lidoderm Place 1 patch onto the skin daily. Remove & Discard patch within 12 hours or as directed by MD   multivitamin with minerals Tabs tablet Take 1 tablet by mouth daily.   Ozempic (1 MG/DOSE) 4 MG/3ML Sopn Generic drug: Semaglutide (1 MG/DOSE) INJECT 1 MG UNDER  THE SKIN ONCE WEEKLY   sildenafil 100 MG tablet Commonly known as: VIAGRA   simvastatin 40 MG tablet Commonly known as: ZOCOR Take 1 tablet (40 mg total) by mouth daily.   tadalafil 20 MG tablet Commonly known as: CIALIS Take 20 mg by mouth 2 (two) times a week.   Tyler Aas FlexTouch 200 UNIT/ML FlexTouch Pen Generic drug: insulin degludec ADMINISTER 90 UNITS UNDER THE SKIN EVERY DAY   triamcinolone acetonide 10 MG/ML injection Commonly known as: KENALOG Inject into the skin.        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   Erectile dysfunction    Multifactorial. Improved with Cialis. New onset.   H/O folliculitis    Heartburn    Herpes exposure    History of herpetic exposure   Hyperlipidemia    Hypertension    variable in nature   Indigestion    Mild obesity    Multiple excoriations    multifactorial in origin   Situational stress    secondary to financial issues   Tobacco abuse     Past Surgical History:  Procedure Laterality Date   I & D EXTREMITY Left 06/28/2014   Procedure: IRRIGATION AND DEBRIDEMENT EXTREMITY;  Surgeon: Leanora Cover, MD;  Location: [facility];  Service: Orthopedics;  Laterality: Left;   INCISION AND DRAINAGE PERIRECTAL ABSCESS N/A 02/17/2014   Procedure: IRRIGATION AND DEBRIDEMENT PERIRECTAL  AND SCROTAL ABSCESS;  Surgeon: Coralie Keens, MD;  Location: [facility];  Service: General;  Laterality: N/A;   MINOR IRRIGATION AND DEBRIDEMENT OF WOUND Left 06/20/2015   Procedure: IRRIGATION AND DEBRIDEMENT Wound with nailbed repair;  Surgeon: Leanora Cover, MD;  Location: [facility];  Service: Orthopedics;  Laterality: Left;    Family History  Problem Relation Age of Onset   Diabetes type II Mother    Hypertension Mother    Diabetes Other     Social History:  reports that he has been smoking cigarettes. He has a 20.00 pack-year smoking history. He has never used smokeless tobacco. He reports that he does not currently use alcohol after a past usage of about 12.0 standard drinks per week. He reports that he does not use drugs.   Review of Systems   Lipid history: Treated by PCP with simvastatin, has mixed hyperlipidemia    Lab Results  Component Value Date   CHOL 131 02/05/2021   HDL 22.00 (L) 02/05/2021   LDLCALC 96 09/23/2013   LDLDIRECT 40.0 02/05/2021   TRIG (H) 02/05/2021    468.0 Triglyceride is over 400; calculations on Lipids are invalid.   CHOLHDL 6 02/05/2021           Hypertension: Has been treated with carvedilol  Apparently Entresto was stopped because of hyperkalemia but blood pressure appears higher Followed by cardiologist Dr. Terrence Dupont   BP Readings from Last 3 Encounters:  02/10/21 (!) 158/86  12/24/20 133/75  12/09/20 130/78     Right foot swelling: He now appears to have Charcot foot followed by orthopedic surgeon  Currently known complications of diabetes: Erectile dysfunction, minimal neuropathy, microalbuminuria  CKD: Not followed by nephrologist, creatinine levels as follows, most recent creatinine 2.4  Lab Results  Component Value Date   CREATININE 2.11 (H) 02/05/2021   CREATININE 2.28 (H) 10/23/2020   CREATININE 2.13 (H) 07/23/2020   HYPERKALEMIA: Resolved  Lab Results  Component Value Date   K 4.3 02/05/2021    HYPONATREMIA: His sodium has been persistently low but asymptomatic   Not on diuretics like HCTZ, only on Lasix daily  Urine osmolality on 01/17/2020 was  high at 433 but subsequently was 242 in 7/22  Sodium appears improved and and now 133   Lab Results  Component Value Date   NA 133 (L) 02/05/2021   K 4.3 02/05/2021   CL 95 (L) 02/05/2021   CO2 32 02/05/2021   Lab Results  Component Value Date   TSH 3.81 10/23/2020    LABS:  Lab on 02/05/2021  Component Date Value Ref Range Status   Cholesterol 02/05/2021 131  0 - 200 mg/dL Final   ATP III Classification       Desirable:  < 200 mg/dL               Borderline High:  200 - 239 mg/dL          High:  > = 240 mg/dL   Triglycerides 02/05/2021 468.0 Triglyceride is over 400; calculations on Lipids are invalid. (A)  0.0 - 149.0 mg/dL Final   Normal:  <150 mg/dLBorderline High:  150 - 199 mg/dL   HDL 02/05/2021 22.00 (A)  >39.00 mg/dL Final   Total CHOL/HDL Ratio 02/05/2021 6   Final                  Men          Women1/2 Average Risk     3.4          3.3Average Risk          5.0          4.42X Average Risk          9.6          7.13X Average Risk          15.0          11.0                       Sodium 02/05/2021 133 (A)  135 - 145 mEq/L Final   Potassium 02/05/2021 4.3  3.5 - 5.1 mEq/L Final   Chloride 02/05/2021 95 (A)  96 - 112 mEq/L Final   CO2 02/05/2021 32  19 - 32 mEq/L Final   Glucose, Bld 02/05/2021 251 (A)  70 - 99 mg/dL Final   BUN 02/05/2021 33 (A)  6 - 23 mg/dL Final   Creatinine, Ser 02/05/2021 2.11 (A)  0.40 - 1.50 mg/dL Final   Total Bilirubin 02/05/2021 0.4  0.2 - 1.2 mg/dL Final   Alkaline Phosphatase 02/05/2021 97  39 - 117 U/L Final   AST 02/05/2021 12  0 - 37 U/L Final   ALT 02/05/2021 10  0 - 53 U/L Final   Total Protein 02/05/2021 8.0  6.0 - 8.3 g/dL Final   Albumin 02/05/2021 3.5  3.5 - 5.2 g/dL Final   GFR 02/05/2021 35.32 (A)  >60.00 mL/min Final   Calculated using the CKD-EPI Creatinine Equation (2021)   Calcium 02/05/2021 9.5  8.4 - 10.5 mg/dL Final   Hgb A1c MFr Bld  02/05/2021 7.5 (A)  4.6 - 6.5 % Final   Glycemic Control Guidelines for People with Diabetes:Non Diabetic:  <6%Goal of Therapy: <7%Additional Action Suggested:  >8%    Direct LDL 02/05/2021 40.0  mg/dL Final   Optimal:  <100 mg/dLNear or Above Optimal:  100-129 mg/dLBorderline High:  130-159 mg/dLHigh:  160-189 mg/dLVery High:  >190 mg/dL    Lab Results  Component Value Date   TSH 3.81 10/23/2020    Physical Examination:  BP (!) 158/86 (BP Location: Left Arm, Patient  Position: Sitting, Cuff Size: Normal)   Pulse 91   Ht 5' 11" (1.803 m)   Wt 293 lb (132.9 kg)   SpO2 96%   BMI 40.87 kg/m       ASSESSMENT:  Diabetes type 2 on insulin  See history of present illness for detailed discussion of current diabetes management, blood sugar patterns and problems identified  His A1c is previously higher at 9.6 and now back to 7.5  He is on basal bolus insulin and Ozempic 1 mg, now Jardiance  His blood sugars are 79 % within target range recently  Although he is on Jardiance this may not be effective for his glycemic control because of renal failure As before he has difficulty getting consistent readings after his meals but no readings are available for the last week from his sensor Also the time on his sensor was programmed 12 hours behind  Also has several artifacts making analysis not accurate Has taken his mealtime dose more consistently especially at lunch Also has more accurate readings with the use of the Dexcom sensor  HYPONATREMIA: Much improved  CKD: To be followed by nephrologist  Lipids: Has high triglycerides not controlled with simvastatin   PLAN:    He will let us know if he has more consistent hypoglycemia after lunch and needs to cut back on lunchtime coverage with smaller meals Also if he is waking up with low readings in the morning he will likely need less [place] Continue Ozempic He will discuss artifacts in his Dexcom tracing with the nurse educator   Likely needs to apply the sensor to the area of the abdomen away from his belt  For his triglycerides we will start 4 g Vascepa cannot use fenofibrate because of renal dysfunction  There are no Patient Instructions on file for this visit.     Elayne Snare 02/10/2021, 11:57 AM   Note: This office note was prepared with Dragon voice recognition system technology. Any transcriptional errors that result from this process are unintentional.

## 2021-02-10 NOTE — Telephone Encounter (Signed)
Please let him know that we did not discuss his cholesterol panel at the visit but he has very high triglycerides or blood fats.  Have sent prescription fish oil called Vascepa to his drugstore.  He will need to make sure he comes fasting on his next labs.  Also remind him that if he is eating smaller meals at lunchtime he will reduce his NovoLog by 4 to 6 units

## 2021-03-18 NOTE — Telephone Encounter (Signed)
Late entry.  Pt. Reported that he was using his dexcom and readings are going to his reader.  He had no questions for me at this time

## 2021-03-30 ENCOUNTER — Telehealth: Payer: Self-pay | Admitting: Endocrinology

## 2021-03-30 DIAGNOSIS — E1165 Type 2 diabetes mellitus with hyperglycemia: Secondary | ICD-10-CM

## 2021-03-30 MED ORDER — HUMALOG KWIKPEN 100 UNIT/ML ~~LOC~~ SOPN: PEN_INJECTOR | SUBCUTANEOUS | 2 refills | Status: DC

## 2021-03-30 NOTE — Telephone Encounter (Signed)
RX now sent ?

## 2021-03-30 NOTE — Telephone Encounter (Signed)
MEDICATION: HUMALOG KWIKPEN 100 UNIT/ML KwikPen  PHARMACY:   [id number] - [place], [place] Phone:  [phone]  Fax:  [phone]      HAS THE PATIENT CONTACTED THEIR PHARMACY?  yes  IS THIS A 90 DAY SUPPLY : yes  IS PATIENT OUT OF MEDICATION: yes  IF NOT; HOW MUCH IS LEFT:   LAST APPOINTMENT DATE: @11 /04/2020  NEXT APPOINTMENT DATE:@1 /30/2023  DO WE HAVE YOUR PERMISSION TO LEAVE A DETAILED MESSAGE?:  OTHER COMMENTS:    **Let patient know to contact pharmacy at the end of the day to make sure medication is ready. **  ** Please notify patient to allow 48-72 hours to process**  **Encourage patient to contact the pharmacy for refills or they can request refills through [facility]**

## 2021-04-22 ENCOUNTER — Ambulatory Visit: Payer: 59 | Admitting: Podiatry

## 2021-04-23 ENCOUNTER — Other Ambulatory Visit: Payer: Self-pay

## 2021-04-23 ENCOUNTER — Ambulatory Visit: Payer: 59 | Admitting: Podiatry

## 2021-04-23 DIAGNOSIS — M79609 Pain in unspecified limb: Secondary | ICD-10-CM | POA: Diagnosis not present

## 2021-04-23 DIAGNOSIS — B351 Tinea unguium: Secondary | ICD-10-CM | POA: Diagnosis not present

## 2021-04-23 DIAGNOSIS — E0842 Diabetes mellitus due to underlying condition with diabetic polyneuropathy: Secondary | ICD-10-CM

## 2021-04-23 DIAGNOSIS — E119 Type 2 diabetes mellitus without complications: Secondary | ICD-10-CM

## 2021-04-23 DIAGNOSIS — M79676 Pain in unspecified toe(s): Secondary | ICD-10-CM

## 2021-04-27 NOTE — Progress Notes (Signed)
Subjective: 51 year old male presents the office today for diabetic foot evaluation and have his nails trimmed as they are thickened elongated he has difficulty doing them they cause discomfort.    Patient has chronic swelling to his right ankle, likely Charcot.  He is currently in the cam boot.  He is under the current treatment of this by Dr. Leisa Lenz, DPM.   Last A1c was down to 7.5 on February 05, 2021.  Previously was 9.6 on October 26, 2020.   Objective: AAO x3, NAD DP/PT pulses palpable bilaterally, CRT less than 3 seconds Protective sensation decreased with Simms Weinstein monofilament Nails are hypertrophic, dystrophic, brittle, discolored, elongated 10. No surrounding redness or drainage. Tenderness nails 1-5 bilaterally.  There is a new hyperkeratotic lesion on the left lateral foot in linear fashion.  No underlying ulceration drainage or any signs of infection noted.   No open lesions. Chronic edema present to the right foot, ankle.  There is no erythema or warmth associated.  There is no open lesions or any drainage.  No significant tenderness to palpation with associated edema. No pain with calf compression, swelling, warmth, erythema  Assessment: Uncontrolled type 2 diabetes with neuropathy; symptomatic onychomycosis; right lower extremity edema  Plan: -All treatment options discussed with the patient including all alternatives, risks, complications.  -We will defer treatment for the ankle to his current treating provider.  He agrees and if I can be of any assistance to let me know. Austin Alexander debrided nails V37 certain complications or bleeding. -Sharply debrided the hyperkeratotic lesion left foot x1 without any complications or bleeding. -Discussed daily foot inspection,  Glucose control  Return in about 3 months (around 07/22/2021).  Trula Slade DPM

## 2021-05-11 ENCOUNTER — Other Ambulatory Visit (INDEPENDENT_AMBULATORY_CARE_PROVIDER_SITE_OTHER): Payer: 59

## 2021-05-11 ENCOUNTER — Other Ambulatory Visit: Payer: Self-pay

## 2021-05-11 DIAGNOSIS — E1165 Type 2 diabetes mellitus with hyperglycemia: Secondary | ICD-10-CM | POA: Diagnosis not present

## 2021-05-11 DIAGNOSIS — E782 Mixed hyperlipidemia: Secondary | ICD-10-CM | POA: Diagnosis not present

## 2021-05-11 DIAGNOSIS — Z794 Long term (current) use of insulin: Secondary | ICD-10-CM

## 2021-05-11 LAB — LIPID PANEL
Cholesterol: 139 mg/dL (ref 0–200)
HDL: 22.7 mg/dL — ABNORMAL LOW (ref >39.00)
Total CHOL/HDL Ratio: 6
Triglycerides: 493 mg/dL — ABNORMAL HIGH (ref 0.0–149.0)

## 2021-05-11 LAB — COMPREHENSIVE METABOLIC PANEL
ALT: 12 U/L (ref 0–53)
AST: 12 U/L (ref 0–37)
Albumin: 3.5 g/dL (ref 3.5–5.2)
Alkaline Phosphatase: 156 U/L — ABNORMAL HIGH (ref 39–117)
BUN: 35 mg/dL — ABNORMAL HIGH (ref 6–23)
CO2: 29 mEq/L (ref 19–32)
Calcium: 8.7 mg/dL (ref 8.4–10.5)
Chloride: 96 mEq/L (ref 96–112)
Creatinine, Ser: 2.06 mg/dL — ABNORMAL HIGH (ref 0.40–1.50)
GFR: 36.29 mL/min — ABNORMAL LOW (ref >60.00)
Glucose, Bld: 123 mg/dL — ABNORMAL HIGH (ref 70–99)
Potassium: 4.5 mEq/L (ref 3.5–5.1)
Sodium: 134 mEq/L — ABNORMAL LOW (ref 135–145)
Total Bilirubin: 0.4 mg/dL (ref 0.2–1.2)
Total Protein: 7.7 g/dL (ref 6.0–8.3)

## 2021-05-11 LAB — LDL CHOLESTEROL, DIRECT: Direct LDL: 43 mg/dL

## 2021-05-11 LAB — HEMOGLOBIN A1C: Hgb A1c MFr Bld: 8.3 % — ABNORMAL HIGH (ref 4.6–6.5)

## 2021-05-13 ENCOUNTER — Encounter: Payer: Self-pay | Admitting: Endocrinology

## 2021-05-13 ENCOUNTER — Ambulatory Visit (INDEPENDENT_AMBULATORY_CARE_PROVIDER_SITE_OTHER): Payer: 59 | Admitting: Endocrinology

## 2021-05-13 ENCOUNTER — Other Ambulatory Visit: Payer: Self-pay

## 2021-05-13 VITALS — BP 142/90 | HR 83 | Ht 72.0 in | Wt 288.0 lb

## 2021-05-13 DIAGNOSIS — Z794 Long term (current) use of insulin: Secondary | ICD-10-CM

## 2021-05-13 DIAGNOSIS — E782 Mixed hyperlipidemia: Secondary | ICD-10-CM

## 2021-05-13 DIAGNOSIS — E1165 Type 2 diabetes mellitus with hyperglycemia: Secondary | ICD-10-CM

## 2021-05-13 MED ORDER — ICOSAPENT ETHYL 1 G PO CAPS: 2.0000 g | ORAL_CAPSULE | Freq: Two times a day (BID) | ORAL | 2 refills | Status: DC

## 2021-05-13 NOTE — Progress Notes (Signed)
Patient ID: Attila Mccarthy, male   DOB: [birth year], 51 y.o.   MRN: [id number]           Reason for Appointment: Follow-up for Type 2 Diabetes   History of Present Illness:          Date of diagnosis of type 2 diabetes mellitus:  2009      Background history:   He is not clear when he was first diagnosed with diabetes Had been previously taking metformin and also for a few years Amaryl and Actos Insulin probably started in 2015 according to records and his A1c has been as high as 15.2 in the past He thinks Trulicity was added about a year ago At some point his A1c apparently has been 7-8% but does not know when  Recent history:    INSULIN regimen is: [place] 90 units daily, Humalog 20 units at lunch and dinner   Non-insulin hypoglycemic drugs the patient is taking are: Ozempic 1 mg weekly, Jardiance 10 mg daily  His A1c is 8.3 compared to 7.5  Current management, blood sugar patterns and problems identified: Unclear why his A1c is higher as recently blood sugars on his Dexcom are averaging only 141  However until last week he was not able to get Ozempic for 1 month He does not have any consistent hyperglycemia except sporadically after some of his meals He thinks that some of his higher sugars may be from his forgetting to take his insulin before eating This may be more often in the evenings when occasionally in the afternoon  Also overnight blood sugars may fluctuate and not clear why but recently fairly good with lab fasting glucose 123  He appears to have had hypoglycemia for a few hours when he was sleeping during the afternoon after coming back from the dentist and not eating lunch  Applying the Dexcom on his abdomen and occasionally may lie on it during the night causing artifacts  Not able to do any exercise        Side effects from medications have been: None  Typical meal intake: Breakfast is frequently skipped, lunch is a largest meal          DEXCOM 6 download for  the last 2 weeks is interpreted as follows  His blood sugars are averaging within the target range all 24 hours with HIGHEST readings around 7-8 PM averaging 176 and lowest readings around 11 AM averaging 126 and also relatively lower readings around 1 PM averaging 121. Moderate variability is present although occasionally does appear to have some artifacts with low excursions Overnight blood sugars are generally fairly stable with some variability and again appears to have significant artifacts in his tracings showing low readings transiently HYPOGLYCEMIA has been minimal except yesterday afternoon his blood sugars were relatively low for a few hours between 2-5 PM.  Also may occasionally have low normal readings overnight especially in the last week unclear why POSTPRANDIAL readings are well controlled after breakfast 10 lunch but occasionally will have postprandial spikes late afternoon or early evenings  His blood sugars after dinner have been going up twice in the last week with average 176 overall  CGM use % of time 89  2-week average/GV 141/32  Time in range 76, was 79%  % Time Above 180 20  % Time above 250 1.5  % Time Below 70 4.3   Previously:  CGM use % of time 50  2-week average/GV   Time in range 79     %  %  Time Above 180 16  % Time above 250 1  % Time Below 70 5    Dietician visit, most recent: years ago  Weight history:  Wt Readings from Last 3 Encounters:  05/13/21 288 lb (130.6 kg)  02/10/21 293 lb (132.9 kg)  12/09/20 (!) 311 lb 9.6 oz (141.3 kg)    Glycemic control:   Lab Results  Component Value Date   HGBA1C 8.3 (H) 05/11/2021   HGBA1C 7.5 (H) 02/05/2021   HGBA1C 9.6 (H) 10/23/2020   Lab Results  Component Value Date   MICROALBUR 51.4 (H) 07/23/2020   LDLCALC 96 09/23/2013   CREATININE 2.06 (H) 05/11/2021   Lab Results  Component Value Date   MICRALBCREAT 110.7 (H) 07/23/2020    Lab Results  Component Value Date   FRUCTOSAMINE 217  02/19/2020   FRUCTOSAMINE 346 (H) 07/20/2019   FRUCTOSAMINE 348 (H) 06/21/2019    Lab on 05/11/2021  Component Date Value Ref Range Status   Cholesterol 05/11/2021 139  0 - 200 mg/dL Final   ATP III Classification       Desirable:  < 200 mg/dL               Borderline High:  200 - 239 mg/dL          High:  > = 240 mg/dL   Triglycerides 05/11/2021 493.0 Triglyceride is over 400; calculations on Lipids are invalid. (H)  0.0 - 149.0 mg/dL Final   Normal:  <150 mg/dLBorderline High:  150 - 199 mg/dL   HDL 05/11/2021 22.70 (L)  >39.00 mg/dL Final   Total CHOL/HDL Ratio 05/11/2021 6   Final                  Men          Women1/2 Average Risk     3.4          3.3Average Risk          5.0          4.42X Average Risk          9.6          7.13X Average Risk          15.0          11.0                       Sodium 05/11/2021 134 (L)  135 - 145 mEq/L Final   Potassium 05/11/2021 4.5  3.5 - 5.1 mEq/L Final   Chloride 05/11/2021 96  96 - 112 mEq/L Final   CO2 05/11/2021 29  19 - 32 mEq/L Final   Glucose, Bld 05/11/2021 123 (H)  70 - 99 mg/dL Final   BUN 05/11/2021 35 (H)  6 - 23 mg/dL Final   Creatinine, Ser 05/11/2021 2.06 (H)  0.40 - 1.50 mg/dL Final   Total Bilirubin 05/11/2021 0.4  0.2 - 1.2 mg/dL Final   Alkaline Phosphatase 05/11/2021 156 (H)  39 - 117 U/L Final   AST 05/11/2021 12  0 - 37 U/L Final   ALT 05/11/2021 12  0 - 53 U/L Final   Total Protein 05/11/2021 7.7  6.0 - 8.3 g/dL Final   Albumin 05/11/2021 3.5  3.5 - 5.2 g/dL Final   GFR 05/11/2021 36.29 (L)  >60.00 mL/min Final   Calculated using the CKD-EPI Creatinine Equation (2021)   Calcium 05/11/2021 8.7  8.4 - 10.5 mg/dL Final   Hgb A1c MFr  Bld 05/11/2021 8.3 (H)  4.6 - 6.5 % Final   Glycemic Control Guidelines for People with Diabetes:Non Diabetic:  <6%Goal of Therapy: <7%Additional Action Suggested:  >8%    Direct LDL 05/11/2021 43.0  mg/dL Final   Optimal:  <100 mg/dLNear or Above Optimal:  100-129 mg/dLBorderline High:   130-159 mg/dLHigh:  160-189 mg/dLVery High:  >190 mg/dL    Allergies as of 05/13/2021   No Known Allergies      Medication List        Accurate as of May 13, 2021  3:30 PM. If you have any questions, ask your nurse or doctor.          STOP taking these medications    doxycycline 100 MG tablet Commonly known as: VIBRA-TABS Stopped by: Elayne Snare, MD       TAKE these medications    Accu-Chek FastClix Lancets Misc USE AS INSTRUCTED TO CHECK BLOOD SUGAR 2 TIMES DAILY.   Accu-Chek Guide Me w/Device Kit by Does not apply route.   Accu-Chek Guide test strip Generic drug: glucose blood Use as instructed to check blood sugar 2 times daily.   allopurinol 100 MG tablet Commonly known as: ZYLOPRIM Take 1 tablet (100 mg total) by mouth daily.   aspirin 81 MG EC tablet Take 1 tablet (81 mg total) by mouth daily.   B-D ULTRAFINE III SHORT PEN 31G X 8 MM Misc Generic drug: Insulin Pen Needle SMARTSIG:Pre-Filled Pen Syringe SUB-Q Daily   blood glucose meter kit and supplies Dispense based on patient and insurance preference. Use up to four times daily as directed. (FOR ICD-9 250.00, 250.01).   carvedilol 25 MG tablet Commonly known as: COREG Take 25 mg by mouth 2 (two) times daily with a meal.   Dexcom G6 Receiver Devi Use to check blood sugars daily   Dexcom G6 Transmitter Misc Change every 90 days   digoxin 0.125 MG tablet Commonly known as: LANOXIN Take 125 mcg by mouth daily.   Entresto 49-51 MG Generic drug: sacubitril-valsartan TK 1 T PO BID   FreeStyle Libre 2 Sensor Misc USE DEVICES EVERY 14 DAYS   Dexcom G6 Sensor Misc Use to monitor blood sugar, change after 10 days   furosemide 40 MG tablet Commonly known as: LASIX Take 1 tablet (40 mg total) by mouth daily. What changed: how much to take   HumaLOG KwikPen 100 UNIT/ML KwikPen Generic drug: insulin lispro ADMINISTER 10 TO 15 UNITS UNDER THE SKIN THREE TIMES DAILY What changed: additional  instructions   icosapent Ethyl 1 g capsule Commonly known as: Vascepa Take 2 capsules (2 g total) by mouth 2 (two) times daily.   Jardiance 10 MG Tabs tablet Generic drug: empagliflozin Take 10 mg by mouth daily.   lidocaine 5 % Commonly known as: Lidoderm Place 1 patch onto the skin daily. Remove & Discard patch within 12 hours or as directed by MD   multivitamin with minerals Tabs tablet Take 1 tablet by mouth daily.   Ozempic (1 MG/DOSE) 4 MG/3ML Sopn Generic drug: Semaglutide (1 MG/DOSE) INJECT 1 MG UNDER THE SKIN ONCE WEEKLY   sildenafil 100 MG tablet Commonly known as: VIAGRA   simvastatin 40 MG tablet Commonly known as: ZOCOR Take 1 tablet (40 mg total) by mouth daily.   tadalafil 20 MG tablet Commonly known as: CIALIS Take 20 mg by mouth 2 (two) times a week.   Tyler Aas FlexTouch 200 UNIT/ML FlexTouch Pen Generic drug: insulin degludec ADMINISTER 90 UNITS UNDER THE SKIN EVERY  DAY   triamcinolone acetonide 10 MG/ML injection Commonly known as: KENALOG Inject into the skin.        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   Erectile dysfunction    Multifactorial. Improved with Cialis. New onset.   H/O folliculitis    Heartburn    Herpes exposure    History of herpetic exposure   Hyperlipidemia    Hypertension    variable in nature   Indigestion    Mild obesity     Multiple excoriations    multifactorial in origin   Situational stress    secondary to financial issues   Tobacco abuse     Past Surgical History:  Procedure Laterality Date   I & D EXTREMITY Left 06/28/2014   Procedure: IRRIGATION AND DEBRIDEMENT EXTREMITY;  Surgeon: Leanora Cover, MD;  Location: [facility];  Service: Orthopedics;  Laterality: Left;   INCISION AND DRAINAGE PERIRECTAL ABSCESS N/A 02/17/2014   Procedure: IRRIGATION AND DEBRIDEMENT PERIRECTAL  AND SCROTAL ABSCESS;  Surgeon: Coralie Keens, MD;  Location: [facility];  Service: General;  Laterality: N/A;   MINOR IRRIGATION AND DEBRIDEMENT OF WOUND Left 06/20/2015   Procedure: IRRIGATION AND DEBRIDEMENT Wound with nailbed repair;  Surgeon: Leanora Cover, MD;  Location: [facility];  Service: Orthopedics;  Laterality: Left;    Family History  Problem Relation Age of Onset   Diabetes type II Mother    Hypertension Mother    Diabetes Other     Social History:  reports that he has been smoking cigarettes. He has a 20.00 pack-year smoking history. He has never used smokeless tobacco. He reports that he does not currently use alcohol after a past usage of about 12.0 standard drinks per week. He reports that he does not use drugs.   Review of Systems   Lipid history: Treated by PCP with simvastatin, has mixed hyperlipidemia Not clear why his Vascepa was not filled when it was sent last November to the pharmacy    Lab Results  Component Value Date   CHOL 139 05/11/2021   HDL 22.70 (L) 05/11/2021   LDLCALC 96 09/23/2013   LDLDIRECT 43.0 05/11/2021   TRIG (H) 05/11/2021    493.0 Triglyceride is over 400; calculations on Lipids are invalid.   CHOLHDL 6 05/11/2021           Hypertension: Has been treated with carvedilol   Followed by cardiologist Dr. Terrence Dupont   BP Readings from Last 3 Encounters:  05/13/21 (!) 142/90  02/10/21 (!) 158/86  12/24/20 133/75     Right foot swelling: He now appears to have Charcot  foot followed by orthopedic surgeon  Currently known complications of diabetes: Erectile dysfunction, minimal neuropathy, microalbuminuria  CKD:   followed by nephrologist, creatinine levels as follows, most recent  creatinine :  Lab Results  Component Value Date   CREATININE 2.06 (H) 05/11/2021   CREATININE 2.11 (H) 02/05/2021   CREATININE 2.28 (H) 10/23/2020   HYPERKALEMIA: Resolved  Lab Results  Component Value Date   K 4.5 05/11/2021    HYPONATREMIA: His sodium has been previously low but asymptomatic  Not on diuretics like HCTZ, only on Lasix daily  Urine osmolality on 01/17/2020 was high at 433 but subsequently was 242 in 7/22  Sodium appears improved and and now 134   Lab Results  Component Value Date   NA 134 (L) 05/11/2021   K 4.5 05/11/2021   CL 96 05/11/2021   CO2 29 05/11/2021   Lab Results  Component Value Date   TSH 3.81 10/23/2020    LABS:  Lab on 05/11/2021  Component Date Value Ref Range Status   Cholesterol 05/11/2021 139  0 - 200 mg/dL Final   ATP III Classification       Desirable:  < 200 mg/dL               Borderline High:  200 - 239 mg/dL          High:  > = 240 mg/dL   Triglycerides 05/11/2021 493.0 Triglyceride is over 400; calculations on Lipids are invalid. (H)  0.0 - 149.0 mg/dL Final   Normal:  <150 mg/dLBorderline High:  150 - 199 mg/dL   HDL 05/11/2021 22.70 (L)  >39.00 mg/dL Final   Total CHOL/HDL Ratio 05/11/2021 6   Final                  Men          Women1/2 Average Risk     3.4          3.3Average Risk          5.0          4.42X Average Risk          9.6          7.13X Average Risk          15.0          11.0                       Sodium 05/11/2021 134 (L)  135 - 145 mEq/L Final   Potassium 05/11/2021 4.5  3.5 - 5.1 mEq/L Final   Chloride 05/11/2021 96  96 - 112 mEq/L Final   CO2 05/11/2021 29  19 - 32 mEq/L Final   Glucose, Bld 05/11/2021 123 (H)  70 - 99 mg/dL Final   BUN 05/11/2021 35 (H)  6 - 23 mg/dL Final   Creatinine,  Ser 05/11/2021 2.06 (H)  0.40 - 1.50 mg/dL Final   Total Bilirubin 05/11/2021 0.4  0.2 - 1.2 mg/dL Final   Alkaline Phosphatase 05/11/2021 156 (H)  39 - 117 U/L Final   AST 05/11/2021 12  0 - 37 U/L Final   ALT 05/11/2021 12  0 - 53 U/L Final   Total Protein 05/11/2021 7.7  6.0 - 8.3 g/dL Final   Albumin 05/11/2021 3.5  3.5 - 5.2 g/dL Final   GFR 05/11/2021 36.29 (L)  >60.00 mL/min Final   Calculated using the CKD-EPI Creatinine Equation (2021)   Calcium 05/11/2021 8.7  8.4 - 10.5 mg/dL Final   Hgb A1c MFr Bld 05/11/2021 8.3 (H)  4.6 - 6.5 % Final   Glycemic Control Guidelines for People with Diabetes:Non Diabetic:  <  6%Goal of Therapy: <7%Additional Action Suggested:  >8%    Direct LDL 05/11/2021 43.0  mg/dL Final   Optimal:  <100 mg/dLNear or Above Optimal:  100-129 mg/dLBorderline High:  130-159 mg/dLHigh:  160-189 mg/dLVery High:  >190 mg/dL    Lab Results  Component Value Date   TSH 3.81 10/23/2020    Physical Examination:  BP (!) 142/90    Pulse 83    Ht 6' (1.829 m)    Wt 288 lb (130.6 kg)    SpO2 97%    BMI 39.06 kg/m       ASSESSMENT:  Diabetes type 2 on insulin  See history of present illness for detailed discussion of current diabetes management, blood sugar patterns and problems identified  His A1c is slightly higher at 8.5  He is on basal bolus insulin and Ozempic 1 mg, now Jardiance  His blood sugars are 76 % within target range recently  His A1c may be higher from not getting Ozempic for a month However currently no consistent pattern seen on his Dexcom which was analyzed and discussed in detail  He can do better with mealtime insulin timing and also adjustment of mealtime dose based on portions and carbohydrates  Currently has low normal readings overnight at times and likely needs less basal, yesterday he appears to have had low sugars when he skipped his lunch   HYPONATREMIA: Consistently better now  CKD: To be followed by nephrologist  Lipids: Has  high triglycerides and has not started Vascepa  PLAN:    He will let us know if he has difficulty getting to Ozempic consistently Reduce [place] to 84 units for now and may need to reduce further if blood sugars are in the 80s or below in the night or fasting He will try to do better with taking his mealtime insulin consistently before starting to eat Also need to adjust the dose 2 to 4 units based on his portions and carbohydrates  Likely needs to apply the sensor to the area of the abdomen where he will not sleep on it and also sometimes can you have the arm  For his triglycerides will again prescribe 4 g Vascepa and see if prior authorization discussed triglyceride targets of at least under 200 is needed Co-pay card given  Use fenofibrate because of renal dysfunction  Patient Instructions  Tresiba 84 units and go up/down 4 units based on am sugars  Keep am range [id number]     Elayne Snare 05/13/2021, 3:30 PM   Note: This office note was prepared with Dragon voice recognition system technology. Any transcriptional errors that result from this process are unintentional.

## 2021-05-13 NOTE — Patient Instructions (Addendum)
Tresiba 84 units and go up/down 4 units based on am sugars  Keep am range 80-130

## 2021-05-15 ENCOUNTER — Encounter: Payer: Self-pay | Admitting: Endocrinology

## 2021-05-20 ENCOUNTER — Other Ambulatory Visit: Payer: Self-pay

## 2021-05-20 DIAGNOSIS — Z794 Long term (current) use of insulin: Secondary | ICD-10-CM

## 2021-05-20 DIAGNOSIS — E1165 Type 2 diabetes mellitus with hyperglycemia: Secondary | ICD-10-CM

## 2021-05-20 MED ORDER — DEXCOM G6 SENSOR MISC: 3 refills | Status: DC

## 2021-06-12 ENCOUNTER — Other Ambulatory Visit: Payer: Self-pay | Admitting: Endocrinology

## 2021-06-15 ENCOUNTER — Other Ambulatory Visit (HOSPITAL_COMMUNITY): Payer: Self-pay

## 2021-06-15 ENCOUNTER — Other Ambulatory Visit: Payer: Self-pay | Admitting: Endocrinology

## 2021-06-23 LAB — HM DIABETES EYE EXAM

## 2021-06-24 ENCOUNTER — Other Ambulatory Visit: Payer: Self-pay | Admitting: Endocrinology

## 2021-06-24 DIAGNOSIS — E1165 Type 2 diabetes mellitus with hyperglycemia: Secondary | ICD-10-CM

## 2021-06-29 ENCOUNTER — Other Ambulatory Visit: Payer: Self-pay

## 2021-06-29 DIAGNOSIS — E1165 Type 2 diabetes mellitus with hyperglycemia: Secondary | ICD-10-CM

## 2021-06-29 MED ORDER — TRESIBA FLEXTOUCH 200 UNIT/ML ~~LOC~~ SOPN: PEN_INJECTOR | SUBCUTANEOUS | 2 refills | Status: DC

## 2021-07-20 ENCOUNTER — Telehealth: Payer: Self-pay

## 2021-07-20 ENCOUNTER — Other Ambulatory Visit (HOSPITAL_COMMUNITY): Payer: Self-pay

## 2021-07-21 NOTE — Telephone Encounter (Signed)
ERROR

## 2021-07-29 ENCOUNTER — Telehealth: Payer: Self-pay | Admitting: Pharmacy Technician

## 2021-07-29 NOTE — Telephone Encounter (Signed)
Patient Advocate Encounter ? ?Received notification from [employer] that RENEWAL prior authorization for DEXOM G6 SENSOR is required. ?  ?PA submitted on 4.19.23 ?Key [id number] ?Status is pending ?  ?[facility] will continue to follow ? ?Ravinder Lukehart R Pamula Luther, CPhT ?Patient Advocate ?[facility] ?Phone: [phone] ?Fax:  [phone] ? ?

## 2021-07-30 ENCOUNTER — Encounter: Payer: Self-pay | Admitting: Endocrinology

## 2021-07-30 ENCOUNTER — Other Ambulatory Visit (HOSPITAL_COMMUNITY): Payer: Self-pay

## 2021-07-30 NOTE — Telephone Encounter (Signed)
Received notification from [facility] regarding a prior authorization for [place]. Authorization has been APPROVED from 4.20.23 to 4.19.24.  ? ? ? ?Authorization # [id number] ? ? ?

## 2021-08-05 ENCOUNTER — Other Ambulatory Visit (INDEPENDENT_AMBULATORY_CARE_PROVIDER_SITE_OTHER): Payer: 59

## 2021-08-05 DIAGNOSIS — E782 Mixed hyperlipidemia: Secondary | ICD-10-CM

## 2021-08-05 DIAGNOSIS — E1165 Type 2 diabetes mellitus with hyperglycemia: Secondary | ICD-10-CM | POA: Diagnosis not present

## 2021-08-05 DIAGNOSIS — Z794 Long term (current) use of insulin: Secondary | ICD-10-CM

## 2021-08-05 LAB — LIPID PANEL
Cholesterol: 96 mg/dL (ref 0–200)
HDL: 28.5 mg/dL — ABNORMAL LOW (ref >39.00)
LDL Cholesterol: 33 mg/dL (ref 0–99)
NonHDL: 67.63
Total CHOL/HDL Ratio: 3
Triglycerides: 172 mg/dL — ABNORMAL HIGH (ref 0.0–149.0)
VLDL: 34.4 mg/dL (ref 0.0–40.0)

## 2021-08-05 LAB — COMPREHENSIVE METABOLIC PANEL
ALT: 12 U/L (ref 0–53)
AST: 15 U/L (ref 0–37)
Albumin: 3.4 g/dL — ABNORMAL LOW (ref 3.5–5.2)
Alkaline Phosphatase: 103 U/L (ref 39–117)
BUN: 29 mg/dL — ABNORMAL HIGH (ref 6–23)
CO2: 26 mEq/L (ref 19–32)
Calcium: 8.6 mg/dL (ref 8.4–10.5)
Chloride: 102 mEq/L (ref 96–112)
Creatinine, Ser: 1.82 mg/dL — ABNORMAL HIGH (ref 0.40–1.50)
GFR: 42.03 mL/min — ABNORMAL LOW (ref >60.00)
Glucose, Bld: 59 mg/dL — ABNORMAL LOW (ref 70–99)
Potassium: 5.1 mEq/L (ref 3.5–5.1)
Sodium: 133 mEq/L — ABNORMAL LOW (ref 135–145)
Total Bilirubin: 0.4 mg/dL (ref 0.2–1.2)
Total Protein: 7.5 g/dL (ref 6.0–8.3)

## 2021-08-05 LAB — HEMOGLOBIN A1C: Hgb A1c MFr Bld: 6.4 % (ref 4.6–6.5)

## 2021-08-11 ENCOUNTER — Ambulatory Visit (INDEPENDENT_AMBULATORY_CARE_PROVIDER_SITE_OTHER): Payer: 59 | Admitting: Endocrinology

## 2021-08-11 ENCOUNTER — Encounter: Payer: Self-pay | Admitting: Endocrinology

## 2021-08-11 VITALS — BP 116/86 | HR 86 | Ht 71.0 in | Wt 278.2 lb

## 2021-08-11 DIAGNOSIS — E1165 Type 2 diabetes mellitus with hyperglycemia: Secondary | ICD-10-CM | POA: Diagnosis not present

## 2021-08-11 DIAGNOSIS — E782 Mixed hyperlipidemia: Secondary | ICD-10-CM

## 2021-08-11 DIAGNOSIS — Z794 Long term (current) use of insulin: Secondary | ICD-10-CM | POA: Diagnosis not present

## 2021-08-11 MED ORDER — DEXCOM G7 SENSOR MISC: 3 refills | Status: DC

## 2021-08-11 MED ORDER — DEXCOM G7 RECEIVER DEVI: 0 refills | Status: DC

## 2021-08-11 NOTE — Patient Instructions (Signed)
Tresiba 70 units daily ? ?Humalog supper dose 12-15 units ?

## 2021-08-11 NOTE — Progress Notes (Signed)
Patient ID: Austin Alexander, male   DOB: 21-Jul-1970, 51 y.o.   MRN: [id number] ? ?       ? ? ?Reason for Appointment: Follow-up for Type 2 Diabetes ? ? ?History of Present Illness:  ?        ?Date of diagnosis of type 2 diabetes mellitus:  2009     ? ?Background history:  ? ?He is not clear when he was first diagnosed with diabetes ?Had been previously taking metformin and also for a few years Amaryl and Actos ?Insulin probably started in 2015 according to records and his A1c has been as high as 15.2 in the past ?He thinks Trulicity was added about a year ago ?At some point his A1c apparently has been 7-8% but does not know when ? ?Recent history:  ? ? ?INSULIN regimen is: [place] 80 units daily, Humalog 20 units at lunch and dinner  ? ?Non-insulin hypoglycemic drugs the patient is taking are: Ozempic 1 mg weekly, Jardiance 10 mg daily ? ?His A1c is back to 6.4 compared to 8.3 ? ?Current management, blood sugar patterns and problems identified: ?He had issues with his Dexcom sensor last month and has not used it for at least 3 weeks ?His time in range was 84% at that time  ?AVERAGE glucose was 116 but he also had 8% of readings below 70  ?With the data available last month his blood sugars were mostly getting low in the evenings but also some overnight ?And was having only minimal hyperglycemia late at night ?He does not like to check his fingersticks and not clear what his blood sugars are recently ?However fasting blood sugar was 59 in the lab and he was asymptomatic ?He has not adjust his insulin based on his blood sugars lately and takes the same dose ?Usually has a larger meal at lunch ?Applying the Dexcom on his abdomen and possibly from lying on it during the night the sensor is showing artifact but also some during the day ?Not able to do much exercise ?Also appears to have lost some weight, about 10 pounds since his last visit possibly from cutting back on portions and continuing Ozempic ? ?      ?Side effects  from medications have been: None ? ?Typical meal intake: Breakfast is frequently skipped, lunch is a largest meal       ? ? ? DEXCOM 6 download as above, no recent data available ?Previous data: ? ?CGM use % of time 89  ?2-week average/GV 141/32  ?Time in range 76, was 79%  ?% Time Above 180 20  ?% Time above 250 1.5  ?% Time Below 70 4.3  ? ? ? ?Dietician visit, most recent: years ago ? ?Weight history: ? ?Wt Readings from Last 3 Encounters:  ?08/11/21 278 lb 3.2 oz (126.2 kg)  ?05/13/21 288 lb (130.6 kg)  ?02/10/21 293 lb (132.9 kg)  ? ? ?Glycemic control: ?  ?Lab Results  ?Component Value Date  ? HGBA1C 6.4 08/05/2021  ? HGBA1C 8.3 (H) 05/11/2021  ? HGBA1C 7.5 (H) 02/05/2021  ? ?Lab Results  ?Component Value Date  ? MICROALBUR 51.4 (H) 07/23/2020  ? [place] 33 08/05/2021  ? CREATININE 1.82 (H) 08/05/2021  ? ?Lab Results  ?Component Value Date  ? MICRALBCREAT 110.7 (H) 07/23/2020  ? ? ?Lab Results  ?Component Value Date  ? FRUCTOSAMINE 217 02/19/2020  ? FRUCTOSAMINE 346 (H) 07/20/2019  ? FRUCTOSAMINE 348 (H) 06/21/2019  ? ? ?Lab on 08/05/2021  ?Component  Date Value Ref Range Status  ? Cholesterol 08/05/2021 96  0 - 200 mg/dL Final  ? ATP III Classification       Desirable:  < 200 mg/dL               Borderline High:  200 - 239 mg/dL          High:  > = 240 mg/dL  ? Triglycerides 08/05/2021 172.0 (H)  0.0 - 149.0 mg/dL Final  ? Normal:  <150 mg/dLBorderline High:  150 - 199 mg/dL  ? HDL 08/05/2021 28.50 (L)  >39.00 mg/dL Final  ? VLDL 08/05/2021 34.4  0.0 - 40.0 mg/dL Final  ? LDL Cholesterol 08/05/2021 33  0 - 99 mg/dL Final  ? Total CHOL/HDL Ratio 08/05/2021 3   Final  ?                Men          Women1/2 Average Risk     3.4          3.3Average Risk          5.0          4.42X Average Risk          9.6          7.13X Average Risk          15.0          11.0                      ? NonHDL 08/05/2021 67.63   Final  ? NOTE:  Non-HDL goal should be 30 mg/dL higher than patient's LDL goal (i.e. LDL goal of < 70  mg/dL, would have non-HDL goal of < 100 mg/dL)  ? Sodium 08/05/2021 133 (L)  135 - 145 mEq/L Final  ? Potassium 08/05/2021 5.1  3.5 - 5.1 mEq/L Final  ? Chloride 08/05/2021 102  96 - 112 mEq/L Final  ? CO2 08/05/2021 26  19 - 32 mEq/L Final  ? Glucose, Bld 08/05/2021 59 (L)  70 - 99 mg/dL Final  ? BUN 08/05/2021 29 (H)  6 - 23 mg/dL Final  ? Creatinine, Ser 08/05/2021 1.82 (H)  0.40 - 1.50 mg/dL Final  ? Total Bilirubin 08/05/2021 0.4  0.2 - 1.2 mg/dL Final  ? Alkaline Phosphatase 08/05/2021 103  39 - 117 U/L Final  ? AST 08/05/2021 15  0 - 37 U/L Final  ? ALT 08/05/2021 12  0 - 53 U/L Final  ? Total Protein 08/05/2021 7.5  6.0 - 8.3 g/dL Final  ? Albumin 08/05/2021 3.4 (L)  3.5 - 5.2 g/dL Final  ? GFR 08/05/2021 42.03 (L)  >60.00 mL/min Final  ? Calculated using the CKD-EPI Creatinine Equation (2021)  ? Calcium 08/05/2021 8.6  8.4 - 10.5 mg/dL Final  ? Hgb A1c MFr Bld 08/05/2021 6.4  4.6 - 6.5 % Final  ? Glycemic Control Guidelines for People with Diabetes:Non Diabetic:  <6%Goal of Therapy: <7%Additional Action Suggested:  >8%   ? ? ?Allergies as of 08/11/2021   ?No Known Allergies ?  ? ?  ?Medication List  ?  ? ?  ? Accurate as of Aug 11, 2021 10:31 AM. If you have any questions, ask your nurse or doctor.  ?  ?  ? ?  ? ?Accu-Chek FastClix Lancets Misc ?USE AS INSTRUCTED TO CHECK BLOOD SUGAR 2 TIMES DAILY. ?  ?Accu-Chek Guide Me w/Device Kit ?by Does not apply route. ?  ?  Accu-Chek Guide test strip ?Generic drug: glucose blood ?Use as instructed to check blood sugar 2 times daily. ?  ?allopurinol 100 MG tablet ?Commonly known as: ZYLOPRIM ?Take 1 tablet (100 mg total) by mouth daily. ?  ?aspirin 81 MG EC tablet ?Take 1 tablet (81 mg total) by mouth daily. ?  ?B-D ULTRAFINE III SHORT PEN 31G X 8 MM Misc ?Generic drug: Insulin Pen Needle ?SMARTSIG:Pre-Filled Pen Syringe SUB-Q Daily ?  ?blood glucose meter kit and supplies ?Dispense based on patient and insurance preference. Use up to four times daily as directed. (FOR  ICD-9 250.00, 250.01). ?  ?carvedilol 25 MG tablet ?Commonly known as: COREG ?Take 25 mg by mouth 2 (two) times daily with a meal. ?  ?Dexcom G6 Receiver Devi ?Use to check blood sugars daily ?  ?Dexcom G6 Transmitter Misc ?Change every 90 days ?  ?digoxin 0.125 MG tablet ?Commonly known as: LANOXIN ?Take 125 mcg by mouth daily. ?  ?Entresto 49-51 MG ?Generic drug: sacubitril-valsartan ?TK 1 T PO BID ?  ?FreeStyle Libre 2 Sensor Misc ?USE DEVICES EVERY 14 DAYS ?  ?Dexcom G6 Sensor Misc ?Use to monitor blood sugar, change after 10 days ?  ?furosemide 40 MG tablet ?Commonly known as: LASIX ?Take 1 tablet (40 mg total) by mouth daily. ?What changed: how much to take ?  ?HumaLOG KwikPen 100 UNIT/ML KwikPen ?Generic drug: insulin lispro ?ADMINISTER 10 units at Breakfast, lunch 20-21 units, dinner 10-12 units ?  ?icosapent Ethyl 1 g capsule ?Commonly known as: Vascepa ?Take 2 capsules (2 g total) by mouth 2 (two) times daily. ?  ?Jardiance 10 MG Tabs tablet ?Generic drug: empagliflozin ?Take 10 mg by mouth daily. ?  ?lidocaine 5 % ?Commonly known as: Lidoderm ?Place 1 patch onto the skin daily. Remove & Discard patch within 12 hours or as directed by MD ?  ?multivitamin with minerals Tabs tablet ?Take 1 tablet by mouth daily. ?  ?Ozempic (1 MG/DOSE) 4 MG/3ML Sopn ?Generic drug: Semaglutide (1 MG/DOSE) ?INJECT 1 MG UNDER THE SKIN EVERY WEEK. ?  ?sildenafil 100 MG tablet ?Commonly known as: VIAGRA ?  ?simvastatin 40 MG tablet ?Commonly known as: ZOCOR ?Take 1 tablet (40 mg total) by mouth daily. ?  ?tadalafil 20 MG tablet ?Commonly known as: CIALIS ?Take 20 mg by mouth 2 (two) times a week. ?  ?Tyler Aas FlexTouch 200 UNIT/ML FlexTouch Pen ?Generic drug: insulin degludec ?ADMINISTER 90 UNITS UNDER THE SKIN EVERY DAY ?  ?triamcinolone acetonide 10 MG/ML injection ?Commonly known as: KENALOG ?Inject into the skin. ?  ? ?  ? ? ?Allergies: No Known Allergies ? ?Past Medical History:  ?Diagnosis Date  ? Abscess   ? 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 Abscess of perineum   ? Alcohol abuse   ? Asthma   ? History of,  ? Bronchitis   ? CHF (congestive heart failure) ([place])   ? COPD (chronic obstructive pulmonary disease) (

## 2021-09-21 ENCOUNTER — Other Ambulatory Visit: Payer: Self-pay | Admitting: Endocrinology

## 2021-09-21 ENCOUNTER — Telehealth: Payer: Self-pay | Admitting: Pharmacy Technician

## 2021-09-21 DIAGNOSIS — E1165 Type 2 diabetes mellitus with hyperglycemia: Secondary | ICD-10-CM

## 2021-09-21 NOTE — Telephone Encounter (Signed)
Patient Advocate Encounter   Received notification from CoverMyMeds that prior authorization for Ozempic 1mg /dose is due for renewal.   PA submitted on 09/21/21 Key [id number] Status is pending    [facility] will continue to follow:

## 2021-09-24 ENCOUNTER — Other Ambulatory Visit (HOSPITAL_COMMUNITY): Payer: Self-pay

## 2021-09-24 NOTE — Telephone Encounter (Signed)
Patient Advocate Encounter  Prior Authorization for Ozempic (1 MG/DOSE) 4MG /3ML pen-injectors  has been approved.    PA# [id number] Effective dates: 09/21/2021 through 09/22/2022

## 2021-10-02 ENCOUNTER — Telehealth: Payer: Self-pay

## 2021-10-20 ENCOUNTER — Other Ambulatory Visit: Payer: Self-pay | Admitting: Endocrinology

## 2021-11-03 ENCOUNTER — Other Ambulatory Visit (HOSPITAL_COMMUNITY): Payer: Self-pay

## 2021-11-09 ENCOUNTER — Other Ambulatory Visit (INDEPENDENT_AMBULATORY_CARE_PROVIDER_SITE_OTHER): Payer: 59

## 2021-11-09 DIAGNOSIS — E1165 Type 2 diabetes mellitus with hyperglycemia: Secondary | ICD-10-CM | POA: Diagnosis not present

## 2021-11-09 DIAGNOSIS — Z794 Long term (current) use of insulin: Secondary | ICD-10-CM | POA: Diagnosis not present

## 2021-11-09 DIAGNOSIS — E782 Mixed hyperlipidemia: Secondary | ICD-10-CM | POA: Diagnosis not present

## 2021-11-09 LAB — COMPREHENSIVE METABOLIC PANEL
ALT: 18 U/L (ref 0–53)
AST: 16 U/L (ref 0–37)
Albumin: 3.7 g/dL (ref 3.5–5.2)
Alkaline Phosphatase: 181 U/L — ABNORMAL HIGH (ref 39–117)
BUN: 30 mg/dL — ABNORMAL HIGH (ref 6–23)
CO2: 27 mEq/L (ref 19–32)
Calcium: 9.1 mg/dL (ref 8.4–10.5)
Chloride: 98 mEq/L (ref 96–112)
Creatinine, Ser: 1.85 mg/dL — ABNORMAL HIGH (ref 0.40–1.50)
GFR: 41.14 mL/min — ABNORMAL LOW (ref >60.00)
Glucose, Bld: 207 mg/dL — ABNORMAL HIGH (ref 70–99)
Potassium: 4.9 mEq/L (ref 3.5–5.1)
Sodium: 133 mEq/L — ABNORMAL LOW (ref 135–145)
Total Bilirubin: 0.3 mg/dL (ref 0.2–1.2)
Total Protein: 8.8 g/dL — ABNORMAL HIGH (ref 6.0–8.3)

## 2021-11-09 LAB — LIPID PANEL
Cholesterol: 121 mg/dL (ref 0–200)
HDL: 23.4 mg/dL — ABNORMAL LOW (ref >39.00)
NonHDL: 97.91
Total CHOL/HDL Ratio: 5
Triglycerides: 373 mg/dL — ABNORMAL HIGH (ref 0.0–149.0)
VLDL: 74.6 mg/dL — ABNORMAL HIGH (ref 0.0–40.0)

## 2021-11-09 LAB — HEMOGLOBIN A1C: Hgb A1c MFr Bld: 7 % — ABNORMAL HIGH (ref 4.6–6.5)

## 2021-11-09 LAB — LDL CHOLESTEROL, DIRECT: Direct LDL: 39 mg/dL

## 2021-11-12 ENCOUNTER — Ambulatory Visit (INDEPENDENT_AMBULATORY_CARE_PROVIDER_SITE_OTHER): Payer: 59 | Admitting: Endocrinology

## 2021-11-12 ENCOUNTER — Encounter: Payer: Self-pay | Admitting: Endocrinology

## 2021-11-12 VITALS — BP 144/72 | HR 83 | Ht 71.0 in | Wt 273.4 lb

## 2021-11-12 DIAGNOSIS — E1165 Type 2 diabetes mellitus with hyperglycemia: Secondary | ICD-10-CM | POA: Diagnosis not present

## 2021-11-12 DIAGNOSIS — E782 Mixed hyperlipidemia: Secondary | ICD-10-CM | POA: Diagnosis not present

## 2021-11-12 DIAGNOSIS — Z794 Long term (current) use of insulin: Secondary | ICD-10-CM

## 2021-11-12 MED ORDER — FENOFIBRATE 48 MG PO TABS: 48.0000 mg | ORAL_TABLET | Freq: Every day | ORAL | 1 refills | Status: DC

## 2021-11-12 NOTE — Progress Notes (Signed)
Patient ID: Austin Alexander, male   DOB: 1971-02-21, 51 y.o.   MRN: [id number]           Reason for Appointment: Follow-up for Type 2 Diabetes   History of Present Illness:          Date of diagnosis of type 2 diabetes mellitus:  2009      Background history:   He is not clear when he was first diagnosed with diabetes Had been previously taking metformin and also for a few years Amaryl and Actos Insulin probably started in 2015 according to records and his A1c has been as high as 15.2 in the past He thinks Trulicity was added about a year ago At some point his A1c apparently has been 7-8% but does not know when  Recent history:    INSULIN regimen is: [place] 70 units daily, Humalog 20 units at lunch and dinner   Non-insulin hypoglycemic drugs the patient is taking are: Ozempic 1 mg weekly, Jardiance 10 mg daily  His A1c is 7  Current management, blood sugar patterns and problems identified: He had issues with his Dexcom sensor again: Only started the [place] a week or 2 ago and has minimal information because of malfunction He thinks he is taking his insulin regularly but not clear why his fasting reading was 207 Not able to do much exercise Also appears to have lost 5 pounds in weight Generally cutting back on portions with continuing Ozempic        Side effects from medications have been: None  Typical meal intake: Breakfast is frequently skipped, lunch is a largest meal         Previous data:  CGM use % of time 89  2-week average/GV 141/32  Time in range 76, was 79%  % Time Above 180 20  % Time above 250 1.5  % Time Below 70 4.3     Dietician visit, most recent: years ago  Weight history:  Wt Readings from Last 3 Encounters:  11/12/21 273 lb 6.4 oz (124 kg)  08/11/21 278 lb 3.2 oz (126.2 kg)  05/13/21 288 lb (130.6 kg)    Glycemic control:   Lab Results  Component Value Date   HGBA1C 7.0 (H) 11/09/2021   HGBA1C 6.4 08/05/2021   HGBA1C 8.3 (H) 05/11/2021    Lab Results  Component Value Date   MICROALBUR 51.4 (H) 07/23/2020   LDLCALC 33 08/05/2021   CREATININE 1.85 (H) 11/09/2021   Lab Results  Component Value Date   MICRALBCREAT 110.7 (H) 07/23/2020    Lab Results  Component Value Date   FRUCTOSAMINE 217 02/19/2020   FRUCTOSAMINE 346 (H) 07/20/2019   FRUCTOSAMINE 348 (H) 06/21/2019    Lab on 11/09/2021  Component Date Value Ref Range Status   Cholesterol 11/09/2021 121  0 - 200 mg/dL Final   ATP III Classification       Desirable:  < 200 mg/dL               Borderline High:  200 - 239 mg/dL          High:  > = 240 mg/dL   Triglycerides 11/09/2021 373.0 (H)  0.0 - 149.0 mg/dL Final   Normal:  <150 mg/dLBorderline High:  150 - 199 mg/dL   HDL 11/09/2021 23.40 (L)  >39.00 mg/dL Final   VLDL 11/09/2021 74.6 (H)  0.0 - 40.0 mg/dL Final   Total CHOL/HDL Ratio 11/09/2021 5   Final  Men          Women1/2 Average Risk     3.4          3.3Average Risk          5.0          4.42X Average Risk          9.6          7.13X Average Risk          15.0          11.0                       NonHDL 11/09/2021 97.91   Final   NOTE:  Non-HDL goal should be 30 mg/dL higher than patient's LDL goal (i.e. LDL goal of < 70 mg/dL, would have non-HDL goal of < 100 mg/dL)   Sodium 11/09/2021 133 (L)  135 - 145 mEq/L Final   Potassium 11/09/2021 4.9  3.5 - 5.1 mEq/L Final   Chloride 11/09/2021 98  96 - 112 mEq/L Final   CO2 11/09/2021 27  19 - 32 mEq/L Final   Glucose, Bld 11/09/2021 207 (H)  70 - 99 mg/dL Final   BUN 11/09/2021 30 (H)  6 - 23 mg/dL Final   Creatinine, Ser 11/09/2021 1.85 (H)  0.40 - 1.50 mg/dL Final   Total Bilirubin 11/09/2021 0.3  0.2 - 1.2 mg/dL Final   Alkaline Phosphatase 11/09/2021 181 (H)  39 - 117 U/L Final   AST 11/09/2021 16  0 - 37 U/L Final   ALT 11/09/2021 18  0 - 53 U/L Final   Total Protein 11/09/2021 8.8 (H)  6.0 - 8.3 g/dL Final   Albumin 11/09/2021 3.7  3.5 - 5.2 g/dL Final   GFR 11/09/2021 41.14 (L)   >60.00 mL/min Final   Calculated using the CKD-EPI Creatinine Equation (2021)   Calcium 11/09/2021 9.1  8.4 - 10.5 mg/dL Final   Hgb A1c MFr Bld 11/09/2021 7.0 (H)  4.6 - 6.5 % Final   Glycemic Control Guidelines for People with Diabetes:Non Diabetic:  <6%Goal of Therapy: <7%Additional Action Suggested:  >8%    Direct LDL 11/09/2021 39.0  mg/dL Final   Optimal:  <100 mg/dLNear or Above Optimal:  100-129 mg/dLBorderline High:  130-159 mg/dLHigh:  160-189 mg/dLVery High:  >190 mg/dL    Allergies as of 11/12/2021   No Known Allergies      Medication List        Accurate as of November 12, 2021  3:50 PM. If you have any questions, ask your nurse or doctor.          Accu-Chek FastClix Lancets Misc USE AS INSTRUCTED TO CHECK BLOOD SUGAR 2 TIMES DAILY.   Accu-Chek Guide Me w/Device Kit by Does not apply route.   Accu-Chek Guide test strip Generic drug: glucose blood Use as instructed to check blood sugar 2 times daily.   allopurinol 100 MG tablet Commonly known as: ZYLOPRIM Take 1 tablet (100 mg total) by mouth daily.   aspirin EC 81 MG tablet Take 1 tablet (81 mg total) by mouth daily.   B-D ULTRAFINE III SHORT PEN 31G X 8 MM Misc Generic drug: Insulin Pen Needle SMARTSIG:Pre-Filled Pen Syringe SUB-Q Daily   blood glucose meter kit and supplies Dispense based on patient and insurance preference. Use up to four times daily as directed. (FOR ICD-9 250.00, 250.01).   carvedilol 25 MG tablet Commonly known as: COREG Take 25 mg  by mouth 2 (two) times daily with a meal.   Dexcom G6 Receiver Devi Use to check blood sugars daily   Dexcom G7 Receiver Devi Use to check blood sugar daily   Dexcom G6 Transmitter Misc Change every 90 days   digoxin 0.125 MG tablet Commonly known as: LANOXIN Take 125 mcg by mouth daily.   Entresto 49-51 MG Generic drug: sacubitril-valsartan TK 1 T PO BID   fenofibrate 48 MG tablet Commonly known as: [place] Take 1 tablet (48 mg total) by  mouth daily. Started by: Elayne Snare, MD   FreeStyle Libre 2 Sensor Misc USE DEVICES EVERY 14 DAYS   Dexcom G6 Sensor Misc Use to monitor blood sugar, change after 10 days   Dexcom G7 Sensor Misc Change every 10 days   furosemide 40 MG tablet Commonly known as: LASIX Take 1 tablet (40 mg total) by mouth daily. What changed: how much to take   HumaLOG KwikPen 100 UNIT/ML KwikPen Generic drug: insulin lispro INJECT 10 UNITS UNDER THE SKIN AT BREAKFAST, 20 TO 21 UNITS AT LUNCH, AND 10 TO 12 UNITS AT DINNER   icosapent Ethyl 1 g capsule Commonly known as: Vascepa Take 2 capsules (2 g total) by mouth 2 (two) times daily.   Jardiance 10 MG Tabs tablet Generic drug: empagliflozin Take 10 mg by mouth daily.   lidocaine 5 % Commonly known as: Lidoderm Place 1 patch onto the skin daily. Remove & Discard patch within 12 hours or as directed by MD   multivitamin with minerals Tabs tablet Take 1 tablet by mouth daily.   Ozempic (1 MG/DOSE) 4 MG/3ML Sopn Generic drug: Semaglutide (1 MG/DOSE) INJECT 1 MG UNDER THE SKIN EVERY WEEK   sildenafil 100 MG tablet Commonly known as: VIAGRA   simvastatin 40 MG tablet Commonly known as: ZOCOR Take 1 tablet (40 mg total) by mouth daily.   tadalafil 20 MG tablet Commonly known as: CIALIS Take 20 mg by mouth 2 (two) times a week.   Tyler Aas FlexTouch 200 UNIT/ML FlexTouch Pen Generic drug: insulin degludec ADMINISTER 90 UNITS UNDER THE SKIN EVERY DAY   triamcinolone acetonide 10 MG/ML injection Commonly known as: KENALOG Inject into the skin.        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   Erectile dysfunction    Multifactorial. Improved with Cialis. New onset.   H/O folliculitis    Heartburn    Herpes exposure    History of herpetic exposure   Hyperlipidemia    Hypertension    variable in nature   Indigestion    Mild obesity    Multiple excoriations    multifactorial in origin   Situational stress    secondary to financial issues   Tobacco abuse     Past Surgical History:  Procedure Laterality Date   I & D EXTREMITY Left 06/28/2014   Procedure: IRRIGATION AND DEBRIDEMENT EXTREMITY;  Surgeon: Leanora Cover, MD;  Location: [facility];  Service: Orthopedics;  Laterality: Left;   INCISION AND DRAINAGE PERIRECTAL ABSCESS N/A 02/17/2014   Procedure: IRRIGATION AND DEBRIDEMENT PERIRECTAL  AND SCROTAL ABSCESS;  Surgeon: Coralie Keens, MD;  Location: [facility];  Service: General;  Laterality: N/A;   MINOR IRRIGATION AND DEBRIDEMENT OF WOUND Left 06/20/2015   Procedure: IRRIGATION AND DEBRIDEMENT Wound with nailbed repair;  Surgeon: Leanora Cover, MD;  Location: [facility];  Service: Orthopedics;  Laterality: Left;    Family History  Problem Relation Age of Onset   Diabetes type II Mother    Hypertension Mother    Diabetes Other     Social History:  reports that he has been smoking cigarettes. He has a 20.00 pack-year smoking history. He has never used smokeless tobacco. He reports that he does not currently use alcohol after a past usage of about 12.0 standard drinks of alcohol per week. He reports that he does not use drugs.   Review of Systems   Lipid history: Treated by PCP with simvastatin, has mixed hyperlipidemia Again unable to get Vascepa prescription filled However his triglycerides are much better  likely from weight loss  Lab Results  Component Value Date   CHOL 121 11/09/2021   CHOL 96 08/05/2021   CHOL 139 05/11/2021   Lab Results  Component Value Date   HDL 23.40 (L) 11/09/2021   HDL 28.50 (L) 08/05/2021   HDL 22.70 (L) 05/11/2021   Lab Results  Component Value Date   LDLCALC 33 08/05/2021   [place] 96 09/23/2013   Lab Results  Component Value Date   TRIG 373.0 (H) 11/09/2021   TRIG 172.0 (H) 08/05/2021   TRIG (H) 05/11/2021    493.0 Triglyceride is over 400; calculations on Lipids are invalid.   Lab Results  Component Value Date   CHOLHDL 5 11/09/2021   CHOLHDL 3 08/05/2021   CHOLHDL 6 05/11/2021   Lab Results  Component Value Date   LDLDIRECT 39.0 11/09/2021   LDLDIRECT 43.0 05/11/2021   LDLDIRECT 40.0 02/05/2021            Hypertension: Has been treated with carvedilol   Followed by cardiologist Dr. Terrence Dupont   BP Readings from Last 3 Encounters:  11/12/21 (!) 144/72  08/11/21 116/86  05/13/21 (!) 142/90     Right foot swelling: Has Charcot foot followed by orthopedic surgeon  Currently known complications of diabetes: Erectile dysfunction, minimal neuropathy, microalbuminuria  CKD:   followed by nephrologist, creatinine levels as follows, most recent creatinine :  Lab Results  Component Value Date   CREATININE 1.85 (H) 11/09/2021   CREATININE 1.82 (H) 08/05/2021   CREATININE 2.06 (H) 05/11/2021   HYPERKALEMIA: Resolved  Lab Results  Component Value Date   K 4.9 11/09/2021    HYPONATREMIA: His sodium has been previously low but asymptomatic  Not on diuretics like HCTZ, only on Lasix daily  Urine osmolality on 01/17/2020 was high at 433 but subsequently was 242 in 7/22  Sodium appears improved and and now 133   Lab Results  Component Value Date   NA 133 (L) 11/09/2021   K 4.9 11/09/2021   CL 98 11/09/2021   CO2 27 11/09/2021   Lab Results  Component Value Date   TSH 3.81 10/23/2020    LABS:  Lab on 11/09/2021   Component Date Value Ref Range  Status   Cholesterol 11/09/2021 121  0 - 200 mg/dL Final   ATP III Classification       Desirable:  < 200 mg/dL               Borderline High:  200 - 239 mg/dL          High:  > = 240 mg/dL   Triglycerides 11/09/2021 373.0 (H)  0.0 - 149.0 mg/dL Final   Normal:  <150 mg/dLBorderline High:  150 - 199 mg/dL   HDL 11/09/2021 23.40 (L)  >39.00 mg/dL Final   VLDL 11/09/2021 74.6 (H)  0.0 - 40.0 mg/dL Final   Total CHOL/HDL Ratio 11/09/2021 5   Final                  Men          Women1/2 Average Risk     3.4          3.3Average Risk          5.0          4.42X Average Risk          9.6          7.13X Average Risk          15.0          11.0                       NonHDL 11/09/2021 97.91   Final   NOTE:  Non-HDL goal should be 30 mg/dL higher than patient's LDL goal (i.e. LDL goal of < 70 mg/dL, would have non-HDL goal of < 100 mg/dL)   Sodium 11/09/2021 133 (L)  135 - 145 mEq/L Final   Potassium 11/09/2021 4.9  3.5 - 5.1 mEq/L Final   Chloride 11/09/2021 98  96 - 112 mEq/L Final   CO2 11/09/2021 27  19 - 32 mEq/L Final   Glucose, Bld 11/09/2021 207 (H)  70 - 99 mg/dL Final   BUN 11/09/2021 30 (H)  6 - 23 mg/dL Final   Creatinine, Ser 11/09/2021 1.85 (H)  0.40 - 1.50 mg/dL Final   Total Bilirubin 11/09/2021 0.3  0.2 - 1.2 mg/dL Final   Alkaline Phosphatase 11/09/2021 181 (H)  39 - 117 U/L Final   AST 11/09/2021 16  0 - 37 U/L Final   ALT 11/09/2021 18  0 - 53 U/L Final   Total Protein 11/09/2021 8.8 (H)  6.0 - 8.3 g/dL Final   Albumin 11/09/2021 3.7  3.5 - 5.2 g/dL Final   GFR 11/09/2021 41.14 (L)  >60.00 mL/min Final   Calculated using the CKD-EPI Creatinine Equation (2021)   Calcium 11/09/2021 9.1  8.4 - 10.5 mg/dL Final   Hgb A1c MFr Bld 11/09/2021 7.0 (H)  4.6 - 6.5 % Final   Glycemic Control Guidelines for People with Diabetes:Non Diabetic:  <6%Goal of Therapy: <7%Additional Action Suggested:  >8%    Direct LDL 11/09/2021 39.0  mg/dL Final   Optimal:  <100  mg/dLNear or Above Optimal:  100-129 mg/dLBorderline High:  130-159 mg/dLHigh:  160-189 mg/dLVery High:  >190 mg/dL    Lab Results  Component Value Date   TSH 3.81 10/23/2020    Physical Examination:  BP (!) 144/72   Pulse 83   Ht '5\' 11"'  (1.803 m)   Wt 273 lb 6.4 oz (124 kg)   SpO2 99%   BMI 38.13 kg/m       ASSESSMENT:  Diabetes type 2 on insulin  See history of present illness for discussion of current diabetes management, blood sugar patterns and problems identified  His A1c is back up to 7% compared to 6.4  He is on basal bolus insulin and Ozempic 1 mg, now Jardiance  To assess his blood sugar control today because of lack of data on his Dexcom and he did not bring his meter also Also not checking readings in the mornings when he wakes up  HYPONATREMIA: Sodium only slightly low   CKD: To be followed by nephrologist, currently creatinine is stable  Lipids: Has high triglycerides, previously not able to get Vascepa on his insurance  PLAN:    He will call Dexcom to troubleshoot his [place] sensor  He needs to use this consistently and will review this again in 2 months  Currently unclear whether he is needing higher doses of basal insulin or not since we do not have any home readings but his lab glucose was 207 fasting  He will let us know if he has higher fasting readings and will likely increase [place]  For his triglycerides since he has persistently high levels and his fasting readings are high we will start him on FENOFIBRATE at a reduced dose of 48 mg  Discussed triglyceride targets and need for weight loss, moderation of carbohydrates and high fat foods and better blood sugar control   There are no Patient Instructions on file for this visit.     Elayne Snare 11/12/2021, 3:50 PM   Note: This office note was prepared with Dragon voice recognition system technology. Any transcriptional errors that result from this process are unintentional.

## 2021-11-17 ENCOUNTER — Other Ambulatory Visit (HOSPITAL_COMMUNITY): Payer: Self-pay

## 2021-12-09 ENCOUNTER — Other Ambulatory Visit: Payer: Self-pay | Admitting: Endocrinology

## 2021-12-09 DIAGNOSIS — E1165 Type 2 diabetes mellitus with hyperglycemia: Secondary | ICD-10-CM

## 2021-12-21 ENCOUNTER — Other Ambulatory Visit: Payer: Self-pay

## 2021-12-21 ENCOUNTER — Encounter (HOSPITAL_COMMUNITY): Payer: Self-pay

## 2021-12-21 ENCOUNTER — Inpatient Hospital Stay (HOSPITAL_COMMUNITY)
Admission: EM | Admit: 2021-12-21 | Discharge: 2021-12-23 | DRG: 812 | Disposition: A | Discharge status: Home / Self Care | Payer: 59 | Attending: Student | Admitting: Student

## 2021-12-21 DIAGNOSIS — I5022 Chronic systolic (congestive) heart failure: Secondary | ICD-10-CM | POA: Diagnosis present

## 2021-12-21 DIAGNOSIS — E875 Hyperkalemia: Secondary | ICD-10-CM | POA: Diagnosis present

## 2021-12-21 DIAGNOSIS — D649 Anemia, unspecified: Secondary | ICD-10-CM | POA: Diagnosis not present

## 2021-12-21 DIAGNOSIS — Z6837 Body mass index (BMI) 37.0-37.9, adult: Secondary | ICD-10-CM

## 2021-12-21 DIAGNOSIS — Z794 Long term (current) use of insulin: Secondary | ICD-10-CM

## 2021-12-21 DIAGNOSIS — D500 Iron deficiency anemia secondary to blood loss (chronic): Secondary | ICD-10-CM | POA: Diagnosis not present

## 2021-12-21 DIAGNOSIS — K2970 Gastritis, unspecified, without bleeding: Secondary | ICD-10-CM | POA: Diagnosis present

## 2021-12-21 DIAGNOSIS — E1165 Type 2 diabetes mellitus with hyperglycemia: Secondary | ICD-10-CM | POA: Diagnosis present

## 2021-12-21 DIAGNOSIS — E1122 Type 2 diabetes mellitus with diabetic chronic kidney disease: Secondary | ICD-10-CM | POA: Diagnosis present

## 2021-12-21 DIAGNOSIS — K7460 Unspecified cirrhosis of liver: Secondary | ICD-10-CM | POA: Diagnosis present

## 2021-12-21 DIAGNOSIS — Z79899 Other long term (current) drug therapy: Secondary | ICD-10-CM

## 2021-12-21 DIAGNOSIS — E781 Pure hyperglyceridemia: Secondary | ICD-10-CM | POA: Diagnosis present

## 2021-12-21 DIAGNOSIS — K219 Gastro-esophageal reflux disease without esophagitis: Secondary | ICD-10-CM | POA: Diagnosis present

## 2021-12-21 DIAGNOSIS — Z833 Family history of diabetes mellitus: Secondary | ICD-10-CM

## 2021-12-21 DIAGNOSIS — I10 Essential (primary) hypertension: Secondary | ICD-10-CM | POA: Diagnosis present

## 2021-12-21 DIAGNOSIS — Z7984 Long term (current) use of oral hypoglycemic drugs: Secondary | ICD-10-CM

## 2021-12-21 DIAGNOSIS — N1832 Chronic kidney disease, stage 3b: Secondary | ICD-10-CM | POA: Diagnosis present

## 2021-12-21 DIAGNOSIS — Z8249 Family history of ischemic heart disease and other diseases of the circulatory system: Secondary | ICD-10-CM

## 2021-12-21 DIAGNOSIS — E119 Type 2 diabetes mellitus without complications: Secondary | ICD-10-CM

## 2021-12-21 DIAGNOSIS — J449 Chronic obstructive pulmonary disease, unspecified: Secondary | ICD-10-CM | POA: Diagnosis present

## 2021-12-21 DIAGNOSIS — I130 Hypertensive heart and chronic kidney disease with heart failure and stage 1 through stage 4 chronic kidney disease, or unspecified chronic kidney disease: Secondary | ICD-10-CM | POA: Diagnosis present

## 2021-12-21 DIAGNOSIS — L732 Hidradenitis suppurativa: Secondary | ICD-10-CM | POA: Diagnosis present

## 2021-12-21 DIAGNOSIS — Z7982 Long term (current) use of aspirin: Secondary | ICD-10-CM

## 2021-12-21 DIAGNOSIS — N179 Acute kidney failure, unspecified: Secondary | ICD-10-CM | POA: Diagnosis not present

## 2021-12-21 DIAGNOSIS — F17210 Nicotine dependence, cigarettes, uncomplicated: Secondary | ICD-10-CM | POA: Diagnosis present

## 2021-12-21 DIAGNOSIS — E785 Hyperlipidemia, unspecified: Secondary | ICD-10-CM | POA: Diagnosis present

## 2021-12-21 DIAGNOSIS — N189 Chronic kidney disease, unspecified: Secondary | ICD-10-CM | POA: Diagnosis present

## 2021-12-21 LAB — CBC WITH DIFFERENTIAL/PLATELET
Abs Immature Granulocytes: 0.04 10*3/uL (ref 0.00–0.07)
Basophils Absolute: 0 10*3/uL (ref 0.0–0.1)
Basophils Relative: 1 %
Eosinophils Absolute: 0.5 10*3/uL (ref 0.0–0.5)
Eosinophils Relative: 6 %
HCT: 23.2 % — ABNORMAL LOW (ref 39.0–52.0)
Hemoglobin: 6.7 g/dL — CL (ref 13.0–17.0)
Immature Granulocytes: 1 %
Lymphocytes Relative: 12 %
Lymphs Abs: 1 10*3/uL (ref 0.7–4.0)
MCH: 21.7 pg — ABNORMAL LOW (ref 26.0–34.0)
MCHC: 28.9 g/dL — ABNORMAL LOW (ref 30.0–36.0)
MCV: 75.1 fL — ABNORMAL LOW (ref 80.0–100.0)
Monocytes Absolute: 0.4 10*3/uL (ref 0.1–1.0)
Monocytes Relative: 5 %
Neutro Abs: 6.6 10*3/uL (ref 1.7–7.7)
Neutrophils Relative %: 75 %
Platelets: 387 10*3/uL (ref 150–400)
RBC: 3.09 MIL/uL — ABNORMAL LOW (ref 4.22–5.81)
RDW: 18.9 % — ABNORMAL HIGH (ref 11.5–15.5)
WBC: 8.5 10*3/uL (ref 4.0–10.5)
nRBC: 0 % (ref 0.0–0.2)

## 2021-12-21 LAB — POC OCCULT BLOOD, ED: Fecal Occult Bld: POSITIVE — AB

## 2021-12-21 LAB — BASIC METABOLIC PANEL
Anion gap: 8 (ref 5–15)
BUN: 32 mg/dL — ABNORMAL HIGH (ref 6–20)
CO2: 21 mmol/L — ABNORMAL LOW (ref 22–32)
Calcium: 8.7 mg/dL — ABNORMAL LOW (ref 8.9–10.3)
Chloride: 105 mmol/L (ref 98–111)
Creatinine, Ser: 2.34 mg/dL — ABNORMAL HIGH (ref 0.61–1.24)
GFR, Estimated: 32 mL/min — ABNORMAL LOW (ref >60)
Glucose, Bld: 156 mg/dL — ABNORMAL HIGH (ref 70–99)
Potassium: 5.5 mmol/L — ABNORMAL HIGH (ref 3.5–5.1)
Sodium: 134 mmol/L — ABNORMAL LOW (ref 135–145)

## 2021-12-21 LAB — ABO/RH: ABO/RH(D): B POS

## 2021-12-21 LAB — RETICULOCYTES
Immature Retic Fract: 34.7 % — ABNORMAL HIGH (ref 2.3–15.9)
RBC.: 3.19 MIL/uL — ABNORMAL LOW (ref 4.22–5.81)
Retic Count, Absolute: 71.5 10*3/uL (ref 19.0–186.0)
Retic Ct Pct: 2.2 % (ref 0.4–3.1)

## 2021-12-21 LAB — IRON AND TIBC
Iron: 22 ug/dL — ABNORMAL LOW (ref 45–182)
Saturation Ratios: 6 % — ABNORMAL LOW (ref 17.9–39.5)
TIBC: 374 ug/dL (ref 250–450)
UIBC: 352 ug/dL

## 2021-12-21 LAB — FERRITIN: Ferritin: 9 ng/mL — ABNORMAL LOW (ref 24–336)

## 2021-12-21 LAB — PREPARE RBC (CROSSMATCH)

## 2021-12-21 LAB — FOLATE: Folate: >40 ng/mL (ref >5.9)

## 2021-12-21 LAB — HEMOGLOBIN AND HEMATOCRIT, BLOOD
HCT: 24.6 % — ABNORMAL LOW (ref 39.0–52.0)
Hemoglobin: 7.4 g/dL — ABNORMAL LOW (ref 13.0–17.0)

## 2021-12-21 LAB — VITAMIN B12: Vitamin B-12: 1366 pg/mL — ABNORMAL HIGH (ref 180–914)

## 2021-12-21 MED ORDER — SODIUM CHLORIDE 0.9% IV SOLUTION: Freq: Once | INTRAVENOUS | Status: DC

## 2021-12-21 MED ORDER — SODIUM CHLORIDE 0.9 % IV BOLUS: 500.0000 mL | Freq: Once | INTRAVENOUS | Status: DC

## 2021-12-21 MED ORDER — IRBESARTAN 75 MG PO TABS: 75.0000 mg | ORAL_TABLET | Freq: Every day | ORAL | Status: DC

## 2021-12-21 MED ORDER — FENOFIBRATE 54 MG PO TABS
54.0000 mg | ORAL_TABLET | Freq: Every day | ORAL | Status: DC
  Administered 2021-12-21 – 2021-12-23 (×3): 54 mg via ORAL
  Filled 2021-12-21 (×3): qty 1

## 2021-12-21 MED ORDER — ACETAMINOPHEN 650 MG RE SUPP: 650.0000 mg | Freq: Four times a day (QID) | RECTAL | Status: DC | PRN

## 2021-12-21 MED ORDER — PANTOPRAZOLE INFUSION (NEW) - SIMPLE MED
8.0000 mg/h | INTRAVENOUS | Status: DC
  Administered 2021-12-21: 8 mg/h via INTRAVENOUS
  Filled 2021-12-21: qty 80
  Filled 2021-12-21: qty 100

## 2021-12-21 MED ORDER — INSULIN ASPART 100 UNIT/ML IJ SOLN
0.0000 [IU] | Freq: Three times a day (TID) | INTRAMUSCULAR | Status: DC
  Administered 2021-12-23: 3 [IU] via SUBCUTANEOUS

## 2021-12-21 MED ORDER — ACETAMINOPHEN 325 MG PO TABS: 650.0000 mg | ORAL_TABLET | Freq: Four times a day (QID) | ORAL | Status: DC | PRN

## 2021-12-21 MED ORDER — ALBUTEROL SULFATE (2.5 MG/3ML) 0.083% IN NEBU: 2.5000 mg | INHALATION_SOLUTION | Freq: Four times a day (QID) | RESPIRATORY_TRACT | Status: DC | PRN

## 2021-12-21 MED ORDER — SODIUM CHLORIDE 0.9% FLUSH
3.0000 mL | Freq: Two times a day (BID) | INTRAVENOUS | Status: DC
  Administered 2021-12-22 – 2021-12-23 (×3): 3 mL via INTRAVENOUS

## 2021-12-21 MED ORDER — PANTOPRAZOLE 80MG IVPB - SIMPLE MED
80.0000 mg | Freq: Once | INTRAVENOUS | Status: AC
  Administered 2021-12-21: 80 mg via INTRAVENOUS
  Filled 2021-12-21: qty 80

## 2021-12-21 MED ORDER — CEFDINIR 300 MG PO CAPS
300.0000 mg | ORAL_CAPSULE | Freq: Two times a day (BID) | ORAL | Status: DC
  Administered 2021-12-21 – 2021-12-23 (×4): 300 mg via ORAL
  Filled 2021-12-21 (×6): qty 1

## 2021-12-21 MED ORDER — SACCHAROMYCES BOULARDII 250 MG PO CAPS
250.0000 mg | ORAL_CAPSULE | Freq: Two times a day (BID) | ORAL | Status: DC
  Administered 2021-12-21 – 2021-12-23 (×4): 250 mg via ORAL
  Filled 2021-12-21 (×4): qty 1

## 2021-12-21 MED ORDER — PANTOPRAZOLE INFUSION (NEW) - SIMPLE MED
8.0000 mg/h | INTRAVENOUS | Status: DC
  Filled 2021-12-21: qty 100

## 2021-12-21 MED ORDER — CARVEDILOL 12.5 MG PO TABS
25.0000 mg | ORAL_TABLET | Freq: Two times a day (BID) | ORAL | Status: DC
  Administered 2021-12-21: 25 mg via ORAL
  Filled 2021-12-21: qty 2

## 2021-12-21 MED ORDER — DIGOXIN 125 MCG PO TABS
125.0000 ug | ORAL_TABLET | Freq: Every day | ORAL | Status: DC
  Administered 2021-12-21 – 2021-12-23 (×3): 125 ug via ORAL
  Filled 2021-12-21 (×3): qty 1

## 2021-12-21 MED ORDER — ALLOPURINOL 100 MG PO TABS: 100.0000 mg | ORAL_TABLET | Freq: Every day | ORAL | Status: DC

## 2021-12-21 NOTE — ED Provider Notes (Signed)
[facility] EMERGENCY DEPARTMENT Provider Note   CSN: [id number] Arrival date & time: 12/21/21  4627     History Past medical history of hypertension, diabetes, hyperlipidemia, obesity, COPD, CHF, CKD Last echo reported to be in 2015 with ejection fraction of 20% Chief Complaint  Patient presents with   Abnormal Lab    Austin Alexander is a 51 y.o. male. Pt complains of anemia.  States he was sent over here by his nephrologist for evaluation of his anemia.  Apparently his hemoglobin was 6.8.  Patient denies any signs of bleeding.  He has been feeling fatigued recently and has had worsening shortness of breath.  He denies any leg swelling, chest pain, abdominal pain, nausea, vomiting, dizziness, vision changes, melena, hematochezia, hematemesis, hemoptysis, hematuria   Abnormal Lab      Home Medications Prior to Admission medications   Medication Sig Start Date End Date Taking? Authorizing Provider  ACCU-CHEK FASTCLIX LANCETS MISC USE AS INSTRUCTED TO CHECK BLOOD SUGAR 2 TIMES DAILY. Patient not taking: Reported on 05/13/2021 05/30/18   Elayne Snare, MD  allopurinol (ZYLOPRIM) 100 MG tablet Take 1 tablet (100 mg total) by mouth daily. 10/10/18   Trula Slade, DPM  aspirin EC 81 MG EC tablet Take 1 tablet (81 mg total) by mouth daily. 09/23/13   Emokpae, Leanne Chang, MD  B-D ULTRAFINE III SHORT PEN 31G X 8 MM MISC SMARTSIG:Pre-Filled Pen Syringe SUB-Q Daily 07/25/20   [provider]  blood glucose meter kit and supplies Dispense based on patient and insurance preference. Use up to four times daily as directed. (FOR ICD-9 250.00, 250.01). 08/12/14   Velvet Bathe, MD  Blood Glucose Monitoring Suppl (ACCU-CHEK GUIDE ME) w/Device KIT by Does not apply route.    [provider]  carvedilol (COREG) 25 MG tablet Take 25 mg by mouth 2 (two) times daily with a meal.    [provider]  Continuous Blood Gluc Receiver (DEXCOM G6 RECEIVER) DEVI Use to  check blood sugars daily Patient not taking: Reported on 11/12/2021 10/09/20   Elayne Snare, MD  Continuous Blood Gluc Receiver (DEXCOM G7 RECEIVER) DEVI Use to check blood sugar daily 08/11/21   Elayne Snare, MD  Continuous Blood Gluc Sensor (DEXCOM G6 SENSOR) MISC Use to monitor blood sugar, change after 10 days Patient not taking: Reported on 11/12/2021 05/20/21   Elayne Snare, MD  Continuous Blood Gluc Sensor (DEXCOM G7 SENSOR) MISC Change every 10 days 08/11/21   Elayne Snare, MD  Continuous Blood Gluc Sensor (FREESTYLE LIBRE 2 SENSOR) MISC USE DEVICES EVERY 14 DAYS Patient not taking: Reported on 05/13/2021 08/03/20   Elayne Snare, MD  Continuous Blood Gluc Transmit (DEXCOM G6 TRANSMITTER) MISC Change every 90 days 10/09/20   Elayne Snare, MD  digoxin (LANOXIN) 0.125 MG tablet Take 125 mcg by mouth daily. 07/19/20   [provider]  ENTRESTO 49-51 MG TK 1 T PO BID 10/11/17   [provider]  fenofibrate (TRICOR) 48 MG tablet Take 1 tablet (48 mg total) by mouth daily. 11/12/21   Elayne Snare, MD  furosemide (LASIX) 40 MG tablet Take 1 tablet (40 mg total) by mouth daily. Patient taking differently: Take 80 mg by mouth daily. 04/08/14   Thurnell Lose, MD  glucose blood (ACCU-CHEK GUIDE) test strip Use as instructed to check blood sugar 2 times daily. Patient not taking: Reported on 05/13/2021 05/09/19   Elayne Snare, MD  HUMALOG KWIKPEN 100 UNIT/ML KwikPen INJECT 10 UNITS UNDER THE SKIN  AT BREAKFAST, 20 TO 21 UNITS WITH LUNCH, AND 10 TO 12 UNITS WITH DINNER 12/09/21   Elayne Snare, MD  icosapent Ethyl (VASCEPA) 1 g capsule Take 2 capsules (2 g total) by mouth 2 (two) times daily. 05/13/21   Elayne Snare, MD  insulin degludec (TRESIBA FLEXTOUCH) 200 UNIT/ML FlexTouch Pen ADMINISTER 90 UNITS UNDER THE SKIN EVERY DAY 06/29/21   Elayne Snare, MD  JARDIANCE 10 MG TABS tablet Take 10 mg by mouth daily. 12/22/20   [provider]  lidocaine (LIDODERM) 5 % Place 1 patch onto the skin daily. Remove & Discard  patch within 12 hours or as directed by MD 12/24/20   Hughie Closs, PA-C  Multiple Vitamin (MULTIVITAMIN WITH MINERALS) TABS tablet Take 1 tablet by mouth daily.    [provider]  OZEMPIC, 1 MG/DOSE, 4 MG/3ML SOPN INJECT 1 MG UNDER THE SKIN EVERY WEEK 10/20/21   Elayne Snare, MD  sildenafil (VIAGRA) 100 MG tablet  10/04/17   [provider]  simvastatin (ZOCOR) 40 MG tablet Take 1 tablet (40 mg total) by mouth daily. 09/23/13   Emokpae, Ejiroghene E, MD  tadalafil (CIALIS) 20 MG tablet Take 20 mg by mouth 2 (two) times a week. 05/14/20   [provider]  triamcinolone acetonide (KENALOG) 10 MG/ML injection Inject into the skin. 12/17/20   [provider]      Allergies    Patient has no known allergies.    Review of Systems   Review of Systems  Constitutional:  Positive for fatigue.  Respiratory:  Positive for shortness of breath.   All other systems reviewed and are negative.   Physical Exam Updated Vital Signs BP (!) 155/82   Pulse 76   Temp 99.1 F (37.3 C) (Oral)   Resp 15   Ht '5\' 11"'  (1.803 m)   Wt 123.8 kg   SpO2 98%   BMI 38.08 kg/m  Physical Exam Vitals and nursing note reviewed.  Constitutional:      General: He is not in acute distress.    Appearance: Normal appearance. He is obese. He is not ill-appearing, toxic-appearing or diaphoretic.  HENT:     Head: Normocephalic and atraumatic.     Nose: No nasal deformity.     Mouth/Throat:     Lips: Pink. No lesions.     Mouth: Mucous membranes are moist. No injury, lacerations, oral lesions or angioedema.     Pharynx: Oropharynx is clear. Uvula midline. No pharyngeal swelling, oropharyngeal exudate, posterior oropharyngeal erythema or uvula swelling.  Eyes:     General: Gaze aligned appropriately. No scleral icterus.       Right eye: No discharge.        Left eye: No discharge.     Conjunctiva/sclera: Conjunctivae normal.     Right eye: Right conjunctiva is not injected. No exudate  or hemorrhage.    Left eye: Left conjunctiva is not injected. No exudate or hemorrhage. Cardiovascular:     Rate and Rhythm: Normal rate and regular rhythm.     Pulses: Normal pulses.          Radial pulses are 2+ on the right side and 2+ on the left side.       Dorsalis pedis pulses are 2+ on the right side and 2+ on the left side.     Heart sounds: Normal heart sounds, S1 normal and S2 normal. Heart sounds not distant. No murmur heard.    No friction rub. No gallop. No  S3 or S4 sounds.  Pulmonary:     Effort: Pulmonary effort is normal. No accessory muscle usage or respiratory distress.     Breath sounds: Normal breath sounds. No stridor. No wheezing, rhonchi or rales.  Chest:     Chest wall: No tenderness.  Abdominal:     General: Abdomen is flat. There is no distension.     Palpations: Abdomen is soft. There is no mass or pulsatile mass.     Tenderness: There is no abdominal tenderness. There is no guarding or rebound.  Genitourinary:    Rectum: Guaiac result positive.  Musculoskeletal:     Right lower leg: No edema.     Left lower leg: No edema.  Skin:    General: Skin is warm and dry.     Coloration: Skin is not jaundiced or pale.     Findings: No bruising, erythema, lesion or rash.  Neurological:     General: No focal deficit present.     Mental Status: He is alert and oriented to person, place, and time.     GCS: GCS eye subscore is 4. GCS verbal subscore is 5. GCS motor subscore is 6.  Psychiatric:        Mood and Affect: Mood normal.        Behavior: Behavior normal. Behavior is cooperative.     ED Results / Procedures / Treatments   Labs (all labs ordered are listed, but only abnormal results are displayed) Labs Reviewed  BASIC METABOLIC PANEL - Abnormal; Notable for the following components:      Result Value   Sodium 134 (*)    Potassium 5.5 (*)    CO2 21 (*)    Glucose, Bld 156 (*)    BUN 32 (*)    Creatinine, Ser 2.34 (*)    Calcium 8.7 (*)    GFR,  Estimated 32 (*)    All other components within normal limits  CBC WITH DIFFERENTIAL/PLATELET - Abnormal; Notable for the following components:   RBC 3.09 (*)    Hemoglobin 6.7 (*)    HCT 23.2 (*)    MCV 75.1 (*)    MCH 21.7 (*)    MCHC 28.9 (*)    RDW 18.9 (*)    All other components within normal limits  POC OCCULT BLOOD, ED - Abnormal; Notable for the following components:   Fecal Occult Bld POSITIVE (*)    All other components within normal limits  VITAMIN B12  FOLATE  IRON AND TIBC  FERRITIN  RETICULOCYTES  TYPE AND SCREEN  ABO/RH  PREPARE RBC (CROSSMATCH)    EKG None  Radiology No results found.  Procedures Procedures  This patient was on telemetry or cardiac monitoring during their time in the ED.    Medications Ordered in ED Medications  0.9 %  sodium chloride infusion (Manually program via Guardrails IV Fluids) (has no administration in time range)  pantoprazole (PROTONIX) 80 mg /NS 100 mL IVPB (has no administration in time range)  pantoprozole (PROTONIX) 80 mg /NS 100 mL infusion (has no administration in time range)  sodium chloride 0.9 % bolus 500 mL (has no administration in time range)    ED Course/ Medical Decision Making/ A&P Clinical Course as of 12/21/21 1621  Mon Dec 21, 2021  1621 I discussed case with Dr. Tamala Julian from [facility] who will evaluate this patient for admission [GL]    Clinical Course User Index [GL] Brandey Vandalen, Adora Fridge, PA-C  Medical Decision Making Amount and/or Complexity of Data Reviewed Labs: ordered.  Risk Prescription drug management. Decision regarding hospitalization.    MDM  This is a 51 y.o. male who presents to the ED with an incidental abnormal lab with hgb of 6.8 The differential of this patient includes but is not limited to GI bleed, other blood loss anemia, IDA, anemia of chronic disease, hemolysis, B12 deficiency, folate deficiency  Initial Impression  Patient is well-appearing in no  acute distress. He has stable vitals  I personally ordered, reviewed, and interpreted all laboratory work and imaging and agree with radiologist interpretation. Results interpreted below:  Hgb 6.7, MCV 75.1 Na 134, K 5.5, Glu 156, Creat 2.34 (slight bump from baseline) No EKG changes from hyperkalemia Fecal Occult +  Assessment/Plan:  Patient is found to be symptomatically anemic with hemoglobin of 6.7 here.  He is asymptomatic as far as having blood in his stool, but he has positive fecal occult.  We will transfuse him 1 unit of packed red blood cells.  Have also started him on Protonix bolus dose and drip.  I have messaged the GI on-call PA provider, Ellouise Newer to have them be aware of this patient for possible occult GI bleed.  He has a slight AKI as well as hyperkalemia.  He does not have any EKG changes, so have given him 500 cc of fluid.  We will continue to monitor.  Plan for admission to medicine service for symptomatic anemia and possible GI bleed.    Charting Requirements Additional history is obtained from:  Independent historian External Records from outside source obtained and reviewed including: Recent UC note, prior creat, prior hgb Social Determinants of Health:  none Pertinant PMH that complicates patient's illness: CKD, CHF  Patient Care Problems that were addressed during this visit: - Symptomatic Anemia: Acute illness with systemic symptoms - Hyperkalemia: Acute illness with complication - AKI: Acute illness with complication This patient was maintained on a cardiac monitor/telemetry. I personally viewed and interpreted the cardiac monitor which reveals an underlying rhythm of NSR Medications given in ED: Protonix gtt and bolus, IVF 500 cc, 1 u pRBC Reevaluation of the patient after these medicines showed that the patient  He will need reevaluation I have reviewed home medications and made changes accordingly.  Critical Care Interventions: n/a Consultations:  GI, THS Disposition: Admit  This is a supervised visit with my attending physician, Dr. Zenia Resides. We have discussed this patient and they have altered the plan as needed.  Portions of this note were generated with Lobbyist. Dictation errors may occur despite best attempts at proofreading.        Final Clinical Impression(s) / ED Diagnoses Final diagnoses:  Symptomatic anemia  Hyperkalemia    Rx / DC Orders ED Discharge Orders     None         [name], PA-C 12/21/21 1621    Lacretia Leigh, MD 12/23/21 [phone]

## 2021-12-21 NOTE — H&P (Signed)
History and Physical    Patient: Austin Alexander VVZ:[id number] DOB: [birth year] DOA: 12/21/2021 DOS: the patient was seen and examined on 12/21/2021 PCP: Rogers Blocker, MD  Patient coming from: home   Chief Complaint:  Chief Complaint  Patient presents with   Abnormal Lab   HPI: Austin Alexander is a 51 y.o. male with medical history significant of hypertension, hypertriglyceridemia, systolic congestive heart failure last EF 20% in 2015, diabetes mellitus type 2, COPD, tobacco abuse, and alcohol use who presented due to having abnormal lab work.  He has seen his nephrologist Dr. Osborne Casco on Friday for routine visit and during that time had routine blood work done.  He is called this morning and told that his hemoglobin was down to 6.7 and that he needed to come to the hospital for further evaluation.  Patient admits that over the last couple of months he has had some shortness of breath with exertion and noticed that he has gotten lightheaded from time to time with standing.  He makes note that he had just recently had a EGD and colonoscopy in March of this year.  Dr. Watt Climes makes note that patient was found to have was some gastritis and 3 polyps were removed at that time that were reported to be noncancerous.  Patient reports that he has not noticed any blood in his stools although they have been dark due to him being on iron.  He is on a daily aspirin and takes Tylenol arthritis for arthritis pain.  Denies any recent fevers, loss of consciousness, chest pain, nausea, vomiting, or abdominal pain symptoms.  The only other recent changes that he has been placed on cefdinir for hidradenitis suppurativa  Upon admission into the emergency department patient was noted to be afebrile with temperature 99.1 F and all other vital signs maintained.  Labs significant for hemoglobin 6.7 with low MCV and MCH, sodium 134, potassium 5.5, BUN 32, and creatinine 2.34.  Stool guaiacs were noted to be positive.  [facility] was  consulted.  Review of Systems: As mentioned in the history of present illness. All other systems reviewed and are negative. 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   Erectile dysfunction    Multifactorial. Improved with Cialis. New onset.   H/O folliculitis    Heartburn    Herpes exposure    History of herpetic exposure   Hyperlipidemia    Hypertension    variable in nature   Indigestion    Mild obesity    Multiple excoriations    multifactorial in origin   Situational stress    secondary to financial issues   Tobacco abuse    Past Surgical History:  Procedure Laterality Date   I & D EXTREMITY Left 06/28/2014   Procedure: IRRIGATION AND DEBRIDEMENT EXTREMITY;  Surgeon: Leanora Cover, MD;  Location: [facility];  Service: Orthopedics;  Laterality: Left;   INCISION AND DRAINAGE PERIRECTAL ABSCESS N/A 02/17/2014   Procedure: IRRIGATION AND DEBRIDEMENT PERIRECTAL  AND SCROTAL ABSCESS;  Surgeon: Coralie Keens, MD;  Location: [facility];  Service: General;  Laterality: N/A;   MINOR IRRIGATION AND DEBRIDEMENT OF WOUND Left 06/20/2015   Procedure: IRRIGATION AND DEBRIDEMENT Wound with nailbed repair;  Surgeon: Leanora Cover, MD;  Location: [facility];  Service: Orthopedics;  Laterality: Left;   Social History:  reports that he has been smoking cigarettes. He has a 20.00 pack-year smoking history. He has never used smokeless  tobacco. He reports that he does not currently use alcohol after a past usage of about 12.0 standard drinks of alcohol per week. He reports that he does not use drugs.  No Known Allergies  Family History  Problem Relation Age of Onset   Diabetes type II Mother    Hypertension Mother    Diabetes Other     Prior to Admission medications   Medication Sig Start Date End Date Taking? Authorizing Provider  ACCU-CHEK FASTCLIX LANCETS MISC USE AS INSTRUCTED TO CHECK BLOOD SUGAR 2 TIMES DAILY. Patient not taking: Reported on 05/13/2021 05/30/18   Elayne Snare, MD  allopurinol (ZYLOPRIM) 100 MG tablet Take 1 tablet (100 mg total) by mouth daily. 10/10/18   Trula Slade, DPM  aspirin EC 81 MG EC tablet Take 1 tablet (81 mg total) by mouth daily. 09/23/13   Emokpae, Leanne Chang, MD  B-D ULTRAFINE III SHORT PEN 31G X 8 MM MISC SMARTSIG:Pre-Filled Pen Syringe SUB-Q Daily 07/25/20   [provider]  blood glucose meter kit and supplies Dispense based on patient and insurance preference. Use up to four times daily as directed. (FOR ICD-9 250.00, 250.01). 08/12/14   Velvet Bathe, MD  Blood Glucose Monitoring Suppl (ACCU-CHEK GUIDE ME) w/Device KIT by Does not apply route.    [provider]  carvedilol (COREG) 25 MG tablet Take 25 mg by mouth 2 (two) times daily with a meal.    [provider]  Continuous Blood Gluc Receiver (DEXCOM G6 RECEIVER) DEVI Use to check blood sugars daily Patient not taking: Reported on 11/12/2021 10/09/20   Elayne Snare, MD  Continuous Blood Gluc Receiver (DEXCOM G7 RECEIVER) DEVI Use to check blood sugar daily 08/11/21   Elayne Snare, MD  Continuous Blood Gluc Sensor (DEXCOM G6 SENSOR) MISC Use to monitor blood sugar, change after 10 days Patient not taking: Reported on 11/12/2021 05/20/21   Elayne Snare, MD  Continuous Blood Gluc Sensor (DEXCOM G7 SENSOR) MISC Change every 10 days 08/11/21   Elayne Snare, MD  Continuous Blood Gluc Sensor (FREESTYLE LIBRE 2 SENSOR)  MISC USE DEVICES EVERY 14 DAYS Patient not taking: Reported on 05/13/2021 08/03/20   Elayne Snare, MD  Continuous Blood Gluc Transmit (DEXCOM G6 TRANSMITTER) MISC Change every 90 days 10/09/20   Elayne Snare, MD  digoxin (LANOXIN) 0.125 MG tablet Take 125 mcg by mouth daily. 07/19/20   [provider]  ENTRESTO 49-51 MG TK 1 T PO BID 10/11/17   [provider]  fenofibrate (TRICOR) 48 MG tablet Take 1 tablet (48 mg total) by mouth daily. 11/12/21   Elayne Snare, MD  furosemide (LASIX) 40 MG tablet Take 1 tablet (40 mg total) by mouth daily. Patient taking differently: Take 80 mg by mouth daily. 04/08/14   Thurnell Lose, MD  glucose blood (ACCU-CHEK GUIDE) test strip Use as instructed to check blood sugar 2  times daily. Patient not taking: Reported on 05/13/2021 05/09/19   Elayne Snare, MD  HUMALOG KWIKPEN 100 UNIT/ML KwikPen INJECT 10 UNITS UNDER THE SKIN AT BREAKFAST, 20 TO 21 UNITS WITH LUNCH, AND 10 TO 12 UNITS WITH DINNER 12/09/21   Elayne Snare, MD  icosapent Ethyl (VASCEPA) 1 g capsule Take 2 capsules (2 g total) by mouth 2 (two) times daily. 05/13/21   Elayne Snare, MD  insulin degludec (TRESIBA FLEXTOUCH) 200 UNIT/ML FlexTouch Pen ADMINISTER 90 UNITS UNDER THE SKIN EVERY DAY 06/29/21   Elayne Snare, MD  JARDIANCE 10 MG TABS tablet Take 10 mg by mouth daily. 12/22/20   [provider]  lidocaine (LIDODERM) 5 % Place 1 patch onto the skin daily. Remove & Discard patch within 12 hours or as directed by MD 12/24/20   Hughie Closs, PA-C  Multiple Vitamin (MULTIVITAMIN WITH MINERALS) TABS tablet Take 1 tablet by mouth daily.    [provider]  OZEMPIC, 1 MG/DOSE, 4 MG/3ML SOPN INJECT 1 MG UNDER THE SKIN EVERY WEEK 10/20/21   Elayne Snare, MD  sildenafil (VIAGRA) 100 MG tablet  10/04/17   [provider]  simvastatin (ZOCOR) 40 MG tablet Take 1 tablet (40 mg total) by mouth daily. 09/23/13   Emokpae, Ejiroghene E, MD  tadalafil (CIALIS) 20 MG tablet Take 20 mg by mouth 2  (two) times a week. 05/14/20   [provider]  triamcinolone acetonide (KENALOG) 10 MG/ML injection Inject into the skin. 12/17/20   [provider]    Physical Exam: Vitals:   12/21/21 0944 12/21/21 0949 12/21/21 1252 12/21/21 1515  BP: 101/64  103/65 (!) 155/82  Pulse: 81  80 76  Resp: '18  17 15  ' Temp: 99 F (37.2 C)  99.1 F (37.3 C)   TempSrc: Oral  Oral   SpO2: 96%  97% 98%  Weight:  123.8 kg    Height:  '5\' 11"'  (1.803 m)     Exam  Constitutional: Middle-age male currently in no acute Eyes: PERRL, lids and conjunctivae normal ENMT: Mucous membranes are moist.  Neck: normal, supple Respiratory: clear to auscultation bilaterally, no wheezing, no crackles. Normal respiratory effort.   Cardiovascular: Regular rate and rhythm, no murmurs / rubs / gallops. No extremity edema. 2+ pedal pulses. No carotid bruits.  Abdomen: no tenderness to palpation. Bowel sounds positive.  Musculoskeletal: no clubbing / cyanosis.  Orthotic of the right lower extremity Skin: no rashes, lesions, ulcers. No induration Neurologic: CN 2-12 grossly intact. Strength 5/5 in all 4.  Psychiatric: Normal judgment and insight. Alert and oriented x 3. Normal mood.   Data Reviewed:  EKG reveals normal sinus rhythm at 79 bpm.  Reviewed labs imaging and pertinent records as noted above.  Assessment and Plan: Symptomatic anemia secondary to GI bleed Patient presents with hemoglobin down to 6.7 g/dL with low MCV and MCH.  He has been told that he had iron deficiency previously and has been on iron supplements. Stool guaiacs were noted to be positive.  He was typed and screened and ordered to be transfused 1 unit of packed red blood cells. -Admit to a telemetry bed -Continue with transfusion of 1 unit of PRBCs -Clear liquid diet -Hold aspirin -Protonix drip per protocol -Appreciate [facility] consultative services, will follow-up for further recommendation  Acute kidney injury superimposed on  chronic kidney disease stage IIIb Patient presents with creatinine elevated up to 2.34 with BUN 32.  Baseline creatinine previously noted to be 1.85 on 10/2021.  This is greater than 0.3 increased from baseline to suggest acute kidney injury. -Recheck kidney function in a.m. -Hold nephrotoxic agents  Systolic congestive heart failure Chronic.  Patient's last EF noted to be around 20% back in 2015.  He is followed by Dr. Terrence Dupont in the outpatient setting.  At this time patient does not appear to be fluid overloaded. -Strict I&O's and daily weights -Furosemide initially held due to worsening kidney function.  Resume when medically appropriate  Essential hypertension Home blood pressure regimen appears to include Coreg 25 mg twice daily with meals, digoxin 0.125 mg daily, Irbesartan 75 mg, and furosemide 80 mg daily. -Held furosemide due to worsening kidney function -Continue blood pressure medication  Diabetes mellitus type 2  Last hemoglobin A1c 7 on 11/09/2021.  Home medication regimen includes Jardiance 10 mg daily, Humalog sliding scale, and Tresiba 90 units daily. -Hypoglycemic protocols -CBGs before every meal with moderate SSI -Resume adjusted long-acting insulin once medications  Hidradenitis suppurativa Followed by dermatology in the outpatient setting and recently started on cefdinir. -Continue cefdinir -Added probiotic  Hypertriglyceridemia Last lipid panel was 11/09/2021 noted HDL 23, LDL 39, triglyceride 373. -Continue simvastatin and fenofibrate    GERD -Protonix as noted above  DVT prophylaxis: SCDs Advance Care Planning:   Code Status: Full Code    Consults: [facility]  Family Communication: none  Severity of Illness: The appropriate patient status for this patient is OBSERVATION. Observation status is judged to be reasonable and necessary in order to provide the required intensity of service to ensure the patient's safety. The patient's presenting symptoms,  physical exam findings, and initial radiographic and laboratory data in the context of their medical condition is felt to place them at decreased risk for further clinical deterioration. Furthermore, it is anticipated that the patient will be medically stable for discharge from the hospital within 2 midnights of admission.   Author: Norval Morton, MD 12/21/2021 4:19 PM  For on call review [email or web address].

## 2021-12-21 NOTE — ED Provider Triage Note (Signed)
Emergency Medicine Provider Triage Evaluation Note  Austin Alexander , a 51 y.o. male  was evaluated in triage.  Pt complains of anemia.  States he was sent over here by his nephrologist for evaluation of his anemia.  Apparently his hemoglobin was 6.8.  Patient denies any signs of bleeding.  He denies melanotic stools, bright red blood in stools, hematuria, hemoptysis, hematochezia, hematemesis.  He has been feeling fatigued recently and has had worsening shortness of breath.  Review of Systems  Positive:  Negative:   Physical Exam  BP 101/64   Pulse 81   Temp 99 F (37.2 C) (Oral)   Resp 18   Ht 5\' 11"  (1.803 m)   Wt 123.8 kg   SpO2 96%   BMI 38.08 kg/m  Gen:   Awake, no distress   Resp:  Normal effort  MSK:   Moves extremities without difficulty  Other:    Medical Decision Making  Medically screening exam initiated at 9:51 AM.  Appropriate orders placed.  Austin Alexander was informed that the remainder of the evaluation will be completed by another provider, this initial triage assessment does not replace that evaluation, and the importance of remaining in the ED until their evaluation is complete.     [name], [place] 12/21/21 [phone]

## 2021-12-21 NOTE — ED Triage Notes (Signed)
Sent by MD for hgb 6.7

## 2021-12-21 NOTE — Progress Notes (Signed)
Brief Nephrology Note  Patient seen routinely in the office on 9/8 and critical Hgb resulted of 6.7. I was notified today.  Patient has had significant and severe anemia for some time. He has been on oral iron with only moderate response. His iron indicies are very low. His insurance has refused to pay for iron infusions despite ample evidence this would be most helpful. I have sent several messages to his PCP to help with w ork up for occult GI bleed with no response.  Appreciate help from the ER. If he were able to receive a transfusion it would be much appreciated. We will continue to seek help from his PCP and try arrange IV iron again

## 2021-12-21 NOTE — ED Notes (Signed)
ED TO INPATIENT HANDOFF REPORT  ED Nurse Name and Phone #: Caryl Pina [phone]  J Name/Age/Gender Austin Alexander 51 y.o. male Room/Bed: 011C/011C  Code Status   Code Status: Full Code  Home/SNF/Other  Patient oriented to: self, place, time, and situation Is this baseline? Yes   Triage Complete: Triage complete  Chief Complaint Symptomatic anemia [D64.9]  Triage Note Sent by MD for hgb 6.7   Allergies No Known Allergies  Level of Care/Admitting Diagnosis ED Disposition     ED Disposition  Admit   Condition  --   [facility] Area: [place] [[id number]]  Level of Care: Telemetry Medical [104]  May place patient in observation at [facility] or [facility] if equivalent level of care is available:: No  Covid Evaluation: Asymptomatic - no recent exposure (last 10 days) testing not required  Diagnosis: Symptomatic anemia [1829937]  Admitting Physician: Norval Morton [[id number]]  Attending Physician: Norval Morton [[id number]]          B Medical/Surgery History Past Medical History:  Diagnosis Date   Abscess    Multiple facial abscesses, 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   Erectile dysfunction    Multifactorial. Improved with Cialis. New onset.   H/O folliculitis    Heartburn    Herpes exposure    History of herpetic exposure   Hyperlipidemia    Hypertension    variable in nature   Indigestion    Mild obesity    Multiple excoriations    multifactorial  in origin   Situational stress    secondary to financial issues   Tobacco abuse    Past Surgical History:  Procedure Laterality Date   I & D EXTREMITY Left 06/28/2014   Procedure: IRRIGATION AND DEBRIDEMENT EXTREMITY;  Surgeon: Leanora Cover, MD;  Location: [facility];  Service: Orthopedics;  Laterality: Left;   INCISION AND DRAINAGE PERIRECTAL ABSCESS N/A 02/17/2014   Procedure: IRRIGATION AND DEBRIDEMENT PERIRECTAL  AND SCROTAL ABSCESS;  Surgeon: Coralie Keens, MD;  Location: [facility];  Service: General;  Laterality: N/A;   MINOR IRRIGATION AND DEBRIDEMENT OF WOUND Left 06/20/2015   Procedure: IRRIGATION AND DEBRIDEMENT Wound with nailbed repair;  Surgeon: Leanora Cover, MD;  Location: [facility];  Service: Orthopedics;  Laterality: Left;     A IV Location/Drains/Wounds Patient Lines/Drains/Airways Status     Active Line/Drains/Airways     Name Placement date Placement time Site Days   Peripheral IV 12/21/21 Left Antecubital 12/21/21  1640  Antecubital  less than 1            Intake/Output Last 24 hours No intake or output data in the 24 hours ending 12/21/21 1738  Labs/Imaging Results for orders placed or performed during the hospital encounter of 12/21/21 (from the past 48 hour(s))  Basic metabolic panel     Status: Abnormal   Collection Time: 12/21/21  9:50 AM  Result Value Ref Range   Sodium 134 (L) 135 - 145 mmol/L   Potassium 5.5 (H) 3.5 - 5.1 mmol/L   Chloride 105 98 - 111 mmol/L   CO2 21 (L) 22 - 32 mmol/L   Glucose, Bld 156 (H) 70 - 99 mg/dL    Comment: Glucose reference range applies only to samples taken after fasting for at least 8 hours.   BUN 32 (H) 6 - 20 mg/dL   Creatinine, Ser 2.34 (H) 0.61 - 1.24 mg/dL   Calcium 8.7 (L) 8.9 - 10.3 mg/dL   GFR, Estimated 32 (L) >60 mL/min    Comment: (NOTE) Calculated using the CKD-EPI Creatinine Equation (2021)    Anion gap 8 5 - 15    Comment: Performed at [facility] [address]  [place], [place] [address]  CBC with Differential     Status: Abnormal   Collection Time: 12/21/21  9:50 AM  Result Value Ref Range   WBC 8.5 4.0 - 10.5 K/uL   RBC 3.09 (L) 4.22 - 5.81 MIL/uL   Hemoglobin 6.7 (LL) 13.0 - 17.0 g/dL    Comment: REPEATED TO VERIFY Reticulocyte Hemoglobin testing may be clinically indicated, consider ordering this additional test [id number] THIS CRITICAL RESULT HAS VERIFIED AND BEEN CALLED TO MEGAN ROBERTS RN. BY TAMEECO CALDWELL ON 09 11 2023 AT 1045, AND HAS BEEN READ BACK.     HCT 23.2 (L) 39.0 - 52.0 %   MCV 75.1 (L) 80.0 - 100.0 fL   MCH 21.7 (L) 26.0 - 34.0 pg   MCHC 28.9 (L) 30.0 - 36.0 g/dL   RDW 18.9 (H) 11.5 - 15.5 %   Platelets 387 150 - 400 K/uL   nRBC 0.0 0.0 - 0.2 %   Neutrophils Relative % 75 %   Neutro Abs 6.6 1.7 - 7.7 K/uL   Lymphocytes Relative 12 %   Lymphs Abs 1.0 0.7 - 4.0 K/uL   Monocytes Relative 5 %   Monocytes Absolute 0.4 0.1 - 1.0 K/uL   Eosinophils Relative 6 %   Eosinophils Absolute 0.5 0.0 - 0.5 K/uL   Basophils Relative 1 %   Basophils Absolute 0.0 0.0 - 0.1 K/uL   Immature Granulocytes 1 %   Abs Immature Granulocytes 0.04 0.00 - 0.07 K/uL    Comment: Performed at [facility] [address], [place], [place] [address]  Type and screen [place]     Status: None (Preliminary result)   Collection Time: 12/21/21  9:50 AM  Result Value Ref Range   ABO/RH(D) B POS    Antibody Screen NEG    Sample Expiration 12/24/2021,2359    Unit Number [id number]    Blood Component Type RED CELLS,LR    Unit division 00    Status of Unit ISSUED    Transfusion Status OK TO TRANSFUSE    Crossmatch Result      Compatible Performed at [facility], [place] [address], [place], [place] [address]   Prepare RBC (crossmatch)     Status: None   Collection Time: 12/21/21  3:02 PM  Result Value Ref Range   Order Confirmation      ORDER PROCESSED BY BLOOD BANK Performed at [facility], [place] [address] [place],  [address]   ABO/Rh     Status: None   Collection Time: 12/21/21  3:14 PM  Result Value Ref Range   ABO/RH(D)  B POS Performed at [facility], [place] [address] [place], [place] [address]   POC occult blood, ED RN will collect     Status: Abnormal   Collection Time: 12/21/21  3:35 PM  Result Value Ref Range   Fecal Occult Bld POSITIVE (A) NEGATIVE  Reticulocytes     Status: Abnormal   Collection Time: 12/21/21  3:45 PM  Result Value Ref Range   Retic Ct Pct 2.2 0.4 - 3.1 %   RBC. 3.19 (L) 4.22 - 5.81 MIL/uL    Comment: REPEATED TO VERIFY   Retic Count, Absolute 71.5 19.0 - 186.0 K/uL    Comment: REPEATED TO VERIFY   Immature Retic Fract 34.7 (H) 2.3 - 15.9 %    Comment: Performed at [facility] [address], [place], [place] [address]   No results found.  Pending Labs Unresulted Labs (From admission, onward)     Start     Ordered   12/22/21 0500  CBC  Tomorrow morning,   R        12/21/21 1631   12/22/21 3710  Basic metabolic panel  Tomorrow morning,   R        12/21/21 1631   12/21/21 1629  HIV Antibody (routine testing w rflx)  (HIV Antibody (Routine testing w reflex) panel)  Once,   R        12/21/21 1631   12/21/21 1503  Vitamin B12  (Anemia Panel (PNL))  Once,   URGENT        12/21/21 1505   12/21/21 1503  Folate  (Anemia Panel (PNL))  Once,   URGENT        12/21/21 1505   12/21/21 1503  Iron and TIBC  (Anemia Panel (PNL))  Once,   URGENT        12/21/21 1505   12/21/21 1503  Ferritin  (Anemia Panel (PNL))  Once,   URGENT        12/21/21 1505            Vitals/Pain Today's Vitals   12/21/21 1252 12/21/21 1515 12/21/21 1637 12/21/21 1700  BP: 103/65 (!) 155/82 131/71 131/71  Pulse: 80 76 77 75  Resp: 17 15 16  (!) 22  Temp: 99.1 F (37.3 C)  98.6 F (37 C) 98.1 F (36.7 C)  TempSrc: Oral  Oral Oral  SpO2: 97% 98% 98% 96%  Weight:      Height:      PainSc:        Isolation Precautions No active  isolations  Medications Medications  0.9 %  sodium chloride infusion (Manually program via Guardrails IV Fluids) (has no administration in time range)  pantoprazole (PROTONIX) 80 mg /NS 100 mL IVPB (has no administration in time range)  sodium chloride 0.9 % bolus 500 mL (has no administration in time range)  pantoprozole (PROTONIX) 80 mg /NS 100 mL infusion (has no administration in time range)  sodium chloride flush (NS) 0.9 % injection 3 mL (has no administration in time range)  acetaminophen (TYLENOL) tablet 650 mg (has no administration in time range)    Or  acetaminophen (TYLENOL) suppository 650 mg (has no administration in time range)  albuterol (PROVENTIL) (2.5 MG/3ML) 0.083% nebulizer solution 2.5 mg (has no administration in time range)    Mobility walks Low fall risk   Focused Assessments Cardiac Assessment Handoff:    Lab Results  Component Value Date   TROPONINI <0.03 08/18/2015   No results found  for: "DDIMER" Does the Patient currently have chest pain? No   , Neuro Assessment Handoff:  Swallow screen pass?  Haven't performed          Neuro Assessment:   Neuro Checks:      Last Documented NIHSS Modified Score:   Has TPA been given? No If patient is a Neuro Trauma and patient is going to OR before floor call report to [facility] nurse: [phone] or [phone]  , Renal Assessment Handoff:  Hemodialysis Schedule:  Last Hemodialysis date and time:    Restricted appendage:    , Pulmonary Assessment Handoff:  Lung sounds:   O2 Device: Room Air      R Recommendations: See Admitting Provider Note  Report given to:   Additional Notes:

## 2021-12-21 NOTE — Consult Note (Signed)
Reason for Consult: Anemia guaiac positivity Referring Physician: Hospital team  Austin Alexander is an 51 y.o. male.  HPI: Patient seen and examined in his hospital computer chart and our office computer chart was reviewed and case discussed with the hospital team and other than some loose stools from his recent antibiotics he really has not had any GI complaints and is on iron at home and his stools are dark but has not seen any blood and is on an aspirin a day but usually takes Tylenol arthritis for his arthritis pain and his family history is negative and he has had 2 colonoscopies with polyps the last one in March of this year and his endoscopy at that time showed gastritis only and his upper tract symptoms are much better on pump inhibitors and he has no other complaints  Past Medical History:  Diagnosis Date   Abscess    Multiple facial abscesses, 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   Erectile dysfunction    Multifactorial. Improved with Cialis. New onset.   H/O folliculitis    Heartburn    Herpes exposure    History of herpetic exposure   Hyperlipidemia    Hypertension    variable in nature   Indigestion    Mild obesity    Multiple excoriations    multifactorial in origin   Situational stress    secondary to financial issues   Tobacco abuse     Past Surgical History:  Procedure Laterality Date   I & D EXTREMITY Left 06/28/2014   Procedure: IRRIGATION AND DEBRIDEMENT EXTREMITY;  Surgeon: Leanora Cover, MD;  Location: [facility];  Service: Orthopedics;  Laterality: Left;   INCISION AND DRAINAGE PERIRECTAL ABSCESS N/A 02/17/2014   Procedure: IRRIGATION AND DEBRIDEMENT PERIRECTAL  AND SCROTAL ABSCESS;  Surgeon: Coralie Keens, MD;  Location: [facility];  Service: General;  Laterality: N/A;   MINOR IRRIGATION AND DEBRIDEMENT OF WOUND Left 06/20/2015   Procedure: IRRIGATION AND DEBRIDEMENT Wound with nailbed repair;  Surgeon: Leanora Cover, MD;  Location: [facility];  Service: Orthopedics;  Laterality: Left;    Family History  Problem Relation Age of Onset   Diabetes type II Mother    Hypertension Mother    Diabetes Other     Social History:  reports that he has been smoking cigarettes. He has a 20.00 pack-year smoking history. He has never used smokeless tobacco. He reports that he does not currently use alcohol after a past usage of about 12.0 standard drinks of alcohol per week. He reports that he does not use drugs.  Allergies: No Known Allergies  Medications: I have reviewed the patient's current medications.  Results for orders placed or performed during the hospital encounter of 12/21/21 (from the past 48 hour(s))  Basic metabolic panel     Status: Abnormal   Collection Time: 12/21/21  9:50 AM  Result Value Ref Range  Sodium 134 (L) 135 - 145 mmol/L   Potassium 5.5 (H) 3.5 - 5.1 mmol/L   Chloride 105 98 - 111 mmol/L   CO2 21 (L) 22 - 32 mmol/L   Glucose, Bld 156 (H) 70 - 99 mg/dL    Comment: Glucose reference range applies only to samples taken after fasting for at least 8 hours.   BUN 32 (H) 6 - 20 mg/dL   Creatinine, Ser 2.34 (H) 0.61 - 1.24 mg/dL   Calcium 8.7 (L) 8.9 - 10.3 mg/dL   GFR, Estimated 32 (L) >60 mL/min    Comment: (NOTE) Calculated using the CKD-EPI Creatinine Equation (2021)    Anion gap 8 5 - 15    Comment: Performed at [facility] [address] [place], [place] [address]  CBC with Differential     Status: Abnormal   Collection Time:  12/21/21  9:50 AM  Result Value Ref Range   WBC 8.5 4.0 - 10.5 K/uL   RBC 3.09 (L) 4.22 - 5.81 MIL/uL   Hemoglobin 6.7 (LL) 13.0 - 17.0 g/dL    Comment: REPEATED TO VERIFY Reticulocyte Hemoglobin testing may be clinically indicated, consider ordering this additional test [id number] THIS CRITICAL RESULT HAS VERIFIED AND BEEN CALLED TO MEGAN ROBERTS RN. BY TAMEECO CALDWELL ON 09 11 2023 AT 1045, AND HAS BEEN READ BACK.     HCT 23.2 (L) 39.0 - 52.0 %   MCV 75.1 (L) 80.0 - 100.0 fL   MCH 21.7 (L) 26.0 - 34.0 pg   MCHC 28.9 (L) 30.0 - 36.0 g/dL   RDW 18.9 (H) 11.5 - 15.5 %   Platelets 387 150 - 400 K/uL   nRBC 0.0 0.0 - 0.2 %   Neutrophils Relative % 75 %   Neutro Abs 6.6 1.7 - 7.7 K/uL   Lymphocytes Relative 12 %   Lymphs Abs 1.0 0.7 - 4.0 K/uL   Monocytes Relative 5 %   Monocytes Absolute 0.4 0.1 - 1.0 K/uL   Eosinophils Relative 6 %   Eosinophils Absolute 0.5 0.0 - 0.5 K/uL   Basophils Relative 1 %   Basophils Absolute 0.0 0.0 - 0.1 K/uL   Immature Granulocytes 1 %   Abs Immature Granulocytes 0.04 0.00 - 0.07 K/uL    Comment: Performed at [facility] [address] [place], [place] [address]  Type and screen [place]     Status: None (Preliminary result)   Collection Time: 12/21/21  9:50 AM  Result Value Ref Range   ABO/RH(D) B POS    Antibody Screen NEG    Sample Expiration 12/24/2021,2359    Unit Number [id number]    Blood Component Type RED CELLS,LR    Unit division 00    Status of Unit ISSUED    Transfusion Status OK TO TRANSFUSE    Crossmatch Result      Compatible Performed at [facility], [place] [address], [place], [place] [address]   Prepare RBC (crossmatch)     Status: None   Collection Time: 12/21/21  3:02 PM  Result Value Ref Range   Order Confirmation      ORDER PROCESSED BY BLOOD BANK Performed at [facility], [place] [address], [place],  [address]   ABO/Rh     Status: None   Collection Time: 12/21/21   3:14 PM  Result Value Ref Range   ABO/RH(D)      B POS Performed at [facility], [address] [place], [place] [address]   POC occult blood, ED RN will collect     Status: Abnormal   Collection Time: 12/21/21  3:35 PM  Result Value Ref Range   Fecal Occult Bld POSITIVE (A) NEGATIVE  Reticulocytes     Status: Abnormal   Collection Time: 12/21/21  3:45 PM  Result Value Ref Range   Retic Ct Pct 2.2 0.4 - 3.1 %   RBC. 3.19 (L) 4.22 - 5.81 MIL/uL    Comment: REPEATED TO VERIFY   Retic Count, Absolute 71.5 19.0 - 186.0 K/uL    Comment: REPEATED TO VERIFY   Immature Retic Fract 34.7 (H) 2.3 - 15.9 %    Comment: Performed at [facility] [address] [place], [place] [address]    No results found.  Review of Systems negative except above Blood pressure 134/74, pulse 76, temperature 98.1 F (36.7 C), temperature source Oral, resp. rate 19, height 5\' 11"  (1.803 m), weight 123.8 kg, SpO2 98 %. Physical Exam vital signs stable afebrile no acute distress abdomen is soft nontender labs pertinent for hemoglobin of 6.7 he was only slightly higher her office in the spring was 2 iron studies pending BUN and creatinine slightly worse guaiac positive  Assessment/Plan: Guaiac positive microcytic anemia chronic renal insufficiency nondiagnostic endoscopy and colonoscopy in March Plan: We will proceed with a oral only contrast CT but hold IV contrast for his kidneys and pending those findings consider capsule endoscopy and okay with me if he eats and continue pump inhibitors and reevaluate his aspirin needs but probably does not need it because of his heart and circulation and will check on tomorrow and see if further inpatient work-up is needed  [facility] 12/21/2021, 5:32 PM

## 2021-12-21 NOTE — H&P (View-Only) (Signed)
Reason for Consult: Anemia guaiac positivity Referring Physician: Hospital team  Austin Alexander is an 51 y.o. male.  HPI: Patient seen and examined in his hospital computer chart and our office computer chart was reviewed and case discussed with the hospital team and other than some loose stools from his recent antibiotics he really has not had any GI complaints and is on iron at home and his stools are dark but has not seen any blood and is on an aspirin a day but usually takes Tylenol arthritis for his arthritis pain and his family history is negative and he has had 2 colonoscopies with polyps the last one in March of this year and his endoscopy at that time showed gastritis only and his upper tract symptoms are much better on pump inhibitors and he has no other complaints  Past Medical History:  Diagnosis Date   Abscess    Multiple facial abscesses, which has progressed   Abscess of perineum    Alcohol abuse    Asthma    History of,   Bronchitis    CHF (congestive heart failure) (HCC)    COPD (chronic obstructive pulmonary disease) ([place])    moderate obstruction with low vital capacity   Croup    Cystic acne    with acne keloidosis   Diabetes mellitus without complication (HCC)    Type 2, uncontrolled with questionable improvement   Dietary noncompliance    History of,   Dizziness    EKG, abnormal    History of, with negative cardiac evaluation by history.   Erectile dysfunction    Multifactorial. Improved with Cialis. New onset.   H/O folliculitis    Heartburn    Herpes exposure    History of herpetic exposure   Hyperlipidemia    Hypertension    variable in nature   Indigestion    Mild obesity    Multiple excoriations    multifactorial in origin   Situational stress    secondary to financial issues   Tobacco abuse     Past Surgical History:  Procedure Laterality Date   I & D EXTREMITY Left 06/28/2014   Procedure: IRRIGATION AND DEBRIDEMENT EXTREMITY;  Surgeon: Leanora Cover, MD;  Location: [facility];  Service: Orthopedics;  Laterality: Left;   INCISION AND DRAINAGE PERIRECTAL ABSCESS N/A 02/17/2014   Procedure: IRRIGATION AND DEBRIDEMENT PERIRECTAL  AND SCROTAL ABSCESS;  Surgeon: Coralie Keens, MD;  Location: [place];  Service: General;  Laterality: N/A;   MINOR IRRIGATION AND DEBRIDEMENT OF WOUND Left 06/20/2015   Procedure: IRRIGATION AND DEBRIDEMENT Wound with nailbed repair;  Surgeon: Leanora Cover, MD;  Location: [facility];  Service: Orthopedics;  Laterality: Left;    Family History  Problem Relation Age of Onset   Diabetes type II Mother    Hypertension Mother    Diabetes Other     Social History:  reports that he has been smoking cigarettes. He has a 20.00 pack-year smoking history. He has never used smokeless tobacco. He reports that he does not currently use alcohol after a past usage of about 12.0 standard drinks of alcohol per week. He reports that he does not use drugs.  Allergies: No Known Allergies  Medications: I have reviewed the patient's current medications.  Results for orders placed or performed during the hospital encounter of 12/21/21 (from the past 48 hour(s))  Basic metabolic panel     Status: Abnormal   Collection Time: 12/21/21  9:50 AM  Result Value Ref Range  Sodium 134 (L) 135 - 145 mmol/L   Potassium 5.5 (H) 3.5 - 5.1 mmol/L   Chloride 105 98 - 111 mmol/L   CO2 21 (L) 22 - 32 mmol/L   Glucose, Bld 156 (H) 70 - 99 mg/dL    Comment: Glucose reference range applies only to samples taken after fasting for at least 8 hours.   BUN 32 (H) 6 - 20 mg/dL   Creatinine, Ser 2.34 (H) 0.61 - 1.24 mg/dL   Calcium 8.7 (L) 8.9 - 10.3 mg/dL   GFR, Estimated 32 (L) >60 mL/min    Comment: (NOTE) Calculated using the CKD-EPI Creatinine Equation (2021)    Anion gap 8 5 - 15    Comment: Performed at [facility] [address] [place], [place] [address]  CBC with Differential     Status: Abnormal   Collection Time:  12/21/21  9:50 AM  Result Value Ref Range   WBC 8.5 4.0 - 10.5 K/uL   RBC 3.09 (L) 4.22 - 5.81 MIL/uL   Hemoglobin 6.7 (LL) 13.0 - 17.0 g/dL    Comment: REPEATED TO VERIFY Reticulocyte Hemoglobin testing may be clinically indicated, consider ordering this additional test [id number] THIS CRITICAL RESULT HAS VERIFIED AND BEEN CALLED TO MEGAN ROBERTS RN. BY TAMEECO CALDWELL ON 09 11 2023 AT 1045, AND HAS BEEN READ BACK.     HCT 23.2 (L) 39.0 - 52.0 %   MCV 75.1 (L) 80.0 - 100.0 fL   MCH 21.7 (L) 26.0 - 34.0 pg   MCHC 28.9 (L) 30.0 - 36.0 g/dL   RDW 18.9 (H) 11.5 - 15.5 %   Platelets 387 150 - 400 K/uL   nRBC 0.0 0.0 - 0.2 %   Neutrophils Relative % 75 %   Neutro Abs 6.6 1.7 - 7.7 K/uL   Lymphocytes Relative 12 %   Lymphs Abs 1.0 0.7 - 4.0 K/uL   Monocytes Relative 5 %   Monocytes Absolute 0.4 0.1 - 1.0 K/uL   Eosinophils Relative 6 %   Eosinophils Absolute 0.5 0.0 - 0.5 K/uL   Basophils Relative 1 %   Basophils Absolute 0.0 0.0 - 0.1 K/uL   Immature Granulocytes 1 %   Abs Immature Granulocytes 0.04 0.00 - 0.07 K/uL    Comment: Performed at [facility] [address] [place], [place] [address]  Type and screen [place]     Status: None (Preliminary result)   Collection Time: 12/21/21  9:50 AM  Result Value Ref Range   ABO/RH(D) B POS    Antibody Screen NEG    Sample Expiration 12/24/2021,2359    Unit Number [id number]    Blood Component Type RED CELLS,LR    Unit division 00    Status of Unit ISSUED    Transfusion Status OK TO TRANSFUSE    Crossmatch Result      Compatible Performed at [facility], [place] [address], [place], [place] [address]   Prepare RBC (crossmatch)     Status: None   Collection Time: 12/21/21  3:02 PM  Result Value Ref Range   Order Confirmation      ORDER PROCESSED BY BLOOD BANK Performed at [facility], [place] [address], [place], [place] [address]   ABO/Rh     Status: None   Collection Time: 12/21/21   3:14 PM  Result Value Ref Range   ABO/RH(D)      B POS Performed at [facility], [address] [place], [place] [address]   POC occult blood, ED RN will collect     Status: Abnormal   Collection Time: 12/21/21  3:35 PM  Result Value Ref Range   Fecal Occult Bld POSITIVE (A) NEGATIVE  Reticulocytes     Status: Abnormal   Collection Time: 12/21/21  3:45 PM  Result Value Ref Range   Retic Ct Pct 2.2 0.4 - 3.1 %   RBC. 3.19 (L) 4.22 - 5.81 MIL/uL    Comment: REPEATED TO VERIFY   Retic Count, Absolute 71.5 19.0 - 186.0 K/uL    Comment: REPEATED TO VERIFY   Immature Retic Fract 34.7 (H) 2.3 - 15.9 %    Comment: Performed at [facility] [address], [place], [place] [address]    No results found.  Review of Systems negative except above Blood pressure 134/74, pulse 76, temperature 98.1 F (36.7 C), temperature source Oral, resp. rate 19, height 5\' 11"  (1.803 m), weight 123.8 kg, SpO2 98 %. Physical Exam vital signs stable afebrile no acute distress abdomen is soft nontender labs pertinent for hemoglobin of 6.7 he was only slightly higher her office in the spring was 2 iron studies pending BUN and creatinine slightly worse guaiac positive  Assessment/Plan: Guaiac positive microcytic anemia chronic renal insufficiency nondiagnostic endoscopy and colonoscopy in March Plan: We will proceed with a oral only contrast CT but hold IV contrast for his kidneys and pending those findings consider capsule endoscopy and okay with me if he eats and continue pump inhibitors and reevaluate his aspirin needs but probably does not need it because of his heart and circulation and will check on tomorrow and see if further inpatient work-up is needed  [facility] E 12/21/2021, 5:32 PM

## 2021-12-22 ENCOUNTER — Encounter (HOSPITAL_COMMUNITY)
Admission: EM | Disposition: A | Discharge status: Home / Self Care | Payer: Self-pay | Source: Home / Self Care | Attending: Internal Medicine

## 2021-12-22 ENCOUNTER — Observation Stay (HOSPITAL_COMMUNITY): Payer: 59

## 2021-12-22 DIAGNOSIS — D500 Iron deficiency anemia secondary to blood loss (chronic): Secondary | ICD-10-CM | POA: Diagnosis present

## 2021-12-22 DIAGNOSIS — N179 Acute kidney failure, unspecified: Secondary | ICD-10-CM | POA: Diagnosis present

## 2021-12-22 DIAGNOSIS — J449 Chronic obstructive pulmonary disease, unspecified: Secondary | ICD-10-CM | POA: Diagnosis present

## 2021-12-22 DIAGNOSIS — Z6837 Body mass index (BMI) 37.0-37.9, adult: Secondary | ICD-10-CM | POA: Diagnosis not present

## 2021-12-22 DIAGNOSIS — Z79899 Other long term (current) drug therapy: Secondary | ICD-10-CM | POA: Diagnosis not present

## 2021-12-22 DIAGNOSIS — K7460 Unspecified cirrhosis of liver: Secondary | ICD-10-CM | POA: Diagnosis present

## 2021-12-22 DIAGNOSIS — D649 Anemia, unspecified: Secondary | ICD-10-CM | POA: Diagnosis not present

## 2021-12-22 DIAGNOSIS — E875 Hyperkalemia: Secondary | ICD-10-CM | POA: Diagnosis present

## 2021-12-22 DIAGNOSIS — I5022 Chronic systolic (congestive) heart failure: Secondary | ICD-10-CM | POA: Diagnosis present

## 2021-12-22 DIAGNOSIS — F17210 Nicotine dependence, cigarettes, uncomplicated: Secondary | ICD-10-CM | POA: Diagnosis present

## 2021-12-22 DIAGNOSIS — L732 Hidradenitis suppurativa: Secondary | ICD-10-CM | POA: Diagnosis present

## 2021-12-22 DIAGNOSIS — K2970 Gastritis, unspecified, without bleeding: Secondary | ICD-10-CM | POA: Diagnosis present

## 2021-12-22 DIAGNOSIS — Z8249 Family history of ischemic heart disease and other diseases of the circulatory system: Secondary | ICD-10-CM | POA: Diagnosis not present

## 2021-12-22 DIAGNOSIS — Z794 Long term (current) use of insulin: Secondary | ICD-10-CM | POA: Diagnosis not present

## 2021-12-22 DIAGNOSIS — Z833 Family history of diabetes mellitus: Secondary | ICD-10-CM | POA: Diagnosis not present

## 2021-12-22 DIAGNOSIS — N1832 Chronic kidney disease, stage 3b: Secondary | ICD-10-CM | POA: Diagnosis present

## 2021-12-22 DIAGNOSIS — E1122 Type 2 diabetes mellitus with diabetic chronic kidney disease: Secondary | ICD-10-CM | POA: Diagnosis present

## 2021-12-22 DIAGNOSIS — Z7982 Long term (current) use of aspirin: Secondary | ICD-10-CM | POA: Diagnosis not present

## 2021-12-22 DIAGNOSIS — E781 Pure hyperglyceridemia: Secondary | ICD-10-CM | POA: Diagnosis present

## 2021-12-22 DIAGNOSIS — E785 Hyperlipidemia, unspecified: Secondary | ICD-10-CM | POA: Diagnosis present

## 2021-12-22 DIAGNOSIS — Z7984 Long term (current) use of oral hypoglycemic drugs: Secondary | ICD-10-CM | POA: Diagnosis not present

## 2021-12-22 DIAGNOSIS — K219 Gastro-esophageal reflux disease without esophagitis: Secondary | ICD-10-CM | POA: Diagnosis present

## 2021-12-22 DIAGNOSIS — I130 Hypertensive heart and chronic kidney disease with heart failure and stage 1 through stage 4 chronic kidney disease, or unspecified chronic kidney disease: Secondary | ICD-10-CM | POA: Diagnosis present

## 2021-12-22 DIAGNOSIS — E1165 Type 2 diabetes mellitus with hyperglycemia: Secondary | ICD-10-CM | POA: Diagnosis present

## 2021-12-22 HISTORY — PX: GIVENS CAPSULE STUDY: SHX5432

## 2021-12-22 LAB — BASIC METABOLIC PANEL
Anion gap: 11 (ref 5–15)
BUN: 30 mg/dL — ABNORMAL HIGH (ref 6–20)
CO2: 21 mmol/L — ABNORMAL LOW (ref 22–32)
Calcium: 8.7 mg/dL — ABNORMAL LOW (ref 8.9–10.3)
Chloride: 104 mmol/L (ref 98–111)
Creatinine, Ser: 2.24 mg/dL — ABNORMAL HIGH (ref 0.61–1.24)
GFR, Estimated: 34 mL/min — ABNORMAL LOW (ref >60)
Glucose, Bld: 93 mg/dL (ref 70–99)
Potassium: 5.1 mmol/L (ref 3.5–5.1)
Sodium: 136 mmol/L (ref 135–145)

## 2021-12-22 LAB — HIV ANTIBODY (ROUTINE TESTING W REFLEX): HIV Screen 4th Generation wRfx: NONREACTIVE

## 2021-12-22 LAB — CBC
HCT: 23 % — ABNORMAL LOW (ref 39.0–52.0)
Hemoglobin: 6.9 g/dL — CL (ref 13.0–17.0)
MCH: 22.2 pg — ABNORMAL LOW (ref 26.0–34.0)
MCHC: 30 g/dL (ref 30.0–36.0)
MCV: 74 fL — ABNORMAL LOW (ref 80.0–100.0)
Platelets: 345 10*3/uL (ref 150–400)
RBC: 3.11 MIL/uL — ABNORMAL LOW (ref 4.22–5.81)
RDW: 19.3 % — ABNORMAL HIGH (ref 11.5–15.5)
WBC: 9.4 10*3/uL (ref 4.0–10.5)
nRBC: 0 % (ref 0.0–0.2)

## 2021-12-22 LAB — GLUCOSE, CAPILLARY
Glucose-Capillary: 94 mg/dL (ref 70–99)
Glucose-Capillary: 98 mg/dL (ref 70–99)
Glucose-Capillary: 108 mg/dL — ABNORMAL HIGH (ref 70–99)
Glucose-Capillary: 96 mg/dL (ref 70–99)
Glucose-Capillary: 116 mg/dL — ABNORMAL HIGH (ref 70–99)

## 2021-12-22 LAB — DIGOXIN LEVEL: Digoxin Level: 1.1 ng/mL (ref 0.8–2.0)

## 2021-12-22 LAB — PREPARE RBC (CROSSMATCH)

## 2021-12-22 SURGERY — IMAGING PROCEDURE, GI TRACT, INTRALUMINAL, VIA CAPSULE
Anesthesia: LOCAL

## 2021-12-22 MED ORDER — SODIUM CHLORIDE 0.9% IV SOLUTION: Freq: Once | INTRAVENOUS | Status: AC

## 2021-12-22 MED ORDER — SODIUM CHLORIDE 0.9 % IV SOLN
250.0000 mg | Freq: Every day | INTRAVENOUS | Status: AC
  Administered 2021-12-22 – 2021-12-23 (×2): 250 mg via INTRAVENOUS
  Filled 2021-12-22 (×2): qty 20

## 2021-12-22 MED ORDER — CARVEDILOL 6.25 MG PO TABS
6.2500 mg | ORAL_TABLET | Freq: Two times a day (BID) | ORAL | Status: DC
  Administered 2021-12-22 – 2021-12-23 (×2): 6.25 mg via ORAL
  Filled 2021-12-22 (×2): qty 1

## 2021-12-22 MED ORDER — PANTOPRAZOLE SODIUM 40 MG IV SOLR
40.0000 mg | Freq: Two times a day (BID) | INTRAVENOUS | Status: DC
  Administered 2021-12-22 – 2021-12-23 (×3): 40 mg via INTRAVENOUS
  Filled 2021-12-22 (×3): qty 10

## 2021-12-22 SURGICAL SUPPLY — 1 items: TOWEL COTTON PACK 4EA (MISCELLANEOUS) ×2 IMPLANT

## 2021-12-22 NOTE — TOC Initial Note (Signed)
Transition of Care [facility]) - Initial/Assessment Note    Patient Details  Name: Austin Alexander MRN: [id number] Date of Birth: 1970/04/19  Transition of Care [facility]) CM/SW Contact:    Tom-Johnson, Renea Ee, RN Phone Number: 12/22/2021, 4:39 PM  Clinical Narrative:                  CM spoke with patient at bedside about needs for post hospital transition. Admitted for Symptomatic Anemia. Hgb on admit was 6.7. PRBC ordered to be transfused. Positive stool guaiacs was noted. GI following.  From home alone. Has four children. Mother and one brother supportive of care. Currently employed at the [employer]. Independent with care and drive self to the hospital. States he will drive self home at discharge.  Has a walker at home. PCP is Rogers Blocker, MD and uses [facility] [employer].  No TOC needs or recommendations noted at this time. CM will continue to follow as patient progresses with care towards discharge.      Expected Discharge Plan: Home/Self Care Barriers to Discharge: Continued Medical Work up   Patient Goals and CMS Choice Patient states their goals for this hospitalization and ongoing recovery are:: To return home CMS Medicare.gov Compare Post Acute Care list provided to:: Patient Choice offered to / list presented to : NA  Expected Discharge Plan and Services Expected Discharge Plan: Home/Self Care   Discharge Planning Services: CM Consult Post Acute Care Choice: NA Living arrangements for the past 2 months: Single Family Home                 DME Arranged: N/A DME Agency: NA       HH Arranged: NA HH Agency: NA        Prior Living Arrangements/Services Living arrangements for the past 2 months: Single Family Home Lives with:: Self Patient language and need for interpreter reviewed:: Yes Do you feel safe going back to the place where you live?: Yes      Need for Family Participation in Patient Care: Yes (Comment) Care giver support system in  place?: Yes (comment)   Criminal Activity/Legal Involvement Pertinent to Current Situation/Hospitalization: No - Comment as needed  Activities of Daily Living Home Assistive Devices/Equipment: None ADL Screening (condition at time of admission) Patient's cognitive ability adequate to safely complete daily activities?: Yes Is the patient deaf or have difficulty hearing?: No Does the patient have difficulty seeing, even when wearing glasses/contacts?: No Does the patient have difficulty concentrating, remembering, or making decisions?: No Patient able to express need for assistance with ADLs?: Yes Does the patient have difficulty dressing or bathing?: No Independently performs ADLs?: Yes (appropriate for developmental age) Does the patient have difficulty walking or climbing stairs?: No Weakness of Legs: None Weakness of Arms/Hands: None  Permission Sought/Granted Permission sought to share information with : Case Manager, Family Supports Permission granted to share information with : Yes, Verbal Permission Granted              Emotional Assessment Appearance:: Appears stated age Attitude/Demeanor/Rapport: Engaged, Gracious Affect (typically observed): Accepting, Appropriate, Calm, Hopeful, Pleasant Orientation: : Oriented to Self, Oriented to Place, Oriented to  Time, Oriented to Situation Alcohol / Substance Use: Not Applicable Psych Involvement: No (comment)  Admission diagnosis:  Hyperkalemia [E87.5] Symptomatic anemia [D64.9] Patient Active Problem List   Diagnosis Date Noted   Symptomatic anemia 12/21/2021   Hidradenitis suppurativa 12/21/2021   Hypertriglyceridemia 12/21/2021   GERD (gastroesophageal reflux disease) 12/21/2021  Otorrhea of right ear 12/30/2017   Perichondritis of auricle, right 12/30/2017   Acute deep vein thrombosis (DVT) of distal vein of right lower extremity ([place]) 05/25/2017   Felon of finger of left hand 09/01/2015   Paronychia of left index  finger 09/01/2015   Allergic reaction 08/18/2015   Hyperkalemia 08/18/2015   Hyperglycemia 08/10/2014   Hyponatremia 08/10/2014   Acute kidney injury superimposed on chronic kidney disease ([place]) 44/31/5400   Chronic systolic CHF (congestive heart failure) ([place]) 08/10/2014   Asthma 08/10/2014   Hand abscess 06/28/2014   Abscess of left hand 06/27/2014   CHF, acute on chronic ([place]) 04/06/2014   DM type 2 (diabetes mellitus, type 2) ([place]) 04/06/2014   Scrotal wall abscess 02/15/2014   Scrotal abscess 10/31/2013   Alcohol abuse 10/31/2013   Pulmonary edema 09/22/2013   Acute on chronic systolic heart failure ([place]) 09/22/2013   Tobacco abuse 09/22/2013   HTN (hypertension) 05/19/2012   Situational stress 05/19/2012   Uncontrolled diabetes mellitus 05/19/2012   PCP:  Rogers Blocker, MD Pharmacy:   [facility], [place] [address] Phone: [phone] Fax: [place], [place] [address] AT [place] [address] Phone: [phone] Fax: [phone]  [facility] [address], [place] - [address] AT [place] [address] [place] [address] Phone: [phone] Fax: [phone]     Social Determinants of Health (SDOH) Interventions    Readmission Risk Interventions     No data to display

## 2021-12-22 NOTE — Progress Notes (Signed)
Austin Alexander 10:10 AM  Subjective: Patient doing fine without any signs of obvious bleeding and no new complaints and is hungry and wants to eat although tolerating clear liquids and we discussed capsule endoscopy as the next test and the warnings were discussed and will proceed today  Objective: Signs stable afebrile no acute distress abdomen is soft nontender BUN and creatinine slight decrease hemoglobin slight decrease iron deficient CT with oral only without significant findings  Assessment: Guaiac positive anemia and patient on an aspirin a day multiple medical problems  Plan: Proceed with capsule endoscopy as above and then can slowly advance diet possibly even discharge soon we will follow-up the capsule endoscopy as an outpatient unless we can read it tomorrow and decide how to proceed  [facility] E  office [phone] After 5PM or if no answer call [phone]

## 2021-12-22 NOTE — Progress Notes (Signed)
Progress Note Patient: Austin Alexander AJG:[id number] DOB: 1970-10-28 DOA: 12/21/2021  DOS: the patient was seen and examined on 12/22/2021  Brief hospital course: Austin Alexander is a 51 y.o. male with medical history significant of hypertension, hypertriglyceridemia, systolic congestive heart failure last EF 20% in 2015, diabetes mellitus type 2, COPD, tobacco abuse, and alcohol use who presented due to having abnormal lab work.  He has seen his nephrologist Dr. Osborne Casco on Friday for routine visit and during that time had routine blood work done.  He is called this morning and told that his hemoglobin was down to 6.7 and that he needed to come to the hospital for further evaluation.  Received 2 PRBC and IV iron infusion. GI consulted currently undergoing capsule endoscopy. Assessment and Plan: Symptomatic anemia secondary to GI bleed FOBT positive Acute on chronic iron deficiency anemia On presentation hemoglobin 6.7. After transfusion hemoglobin still 6.9. No active bleeding reported. We will provide 1 more unit of blood transfusion as well as IV iron infusion. [facility] consulted. Patient was initiated on IV Protonix infusion currently Protonix every 12 hours. Monitor CBC and transfuse for hemoglobin less than 7. Currently undergoing capsule endoscopy. Aspirin on hold for now.   Acute kidney injury superimposed on chronic kidney disease stage IIIb Baseline serum creatinine 1.85. On presentation serum creatinine 2.34. Monitor. Avoid nephrotoxic medication.   Chronic systolic congestive heart failure EF 20%. Follows up with Dr. Terrence Dupont outpatient. Does not appear to be volume overloaded but can be at risk given his need for blood transfusion. Monitor closely.   Essential hypertension Home blood pressure regimen appears to include Coreg 25 mg twice daily with meals, digoxin 0.125 mg daily, Irbesartan 75 mg, and furosemide 80 mg daily. Due to AKI ARB and Lasix currently on  hold. Reducing Coreg dose to 6.25 as well. Continue digoxin.   Type II diabetes mellitus uncontrolled with hyperglycemia with long-term insulin use with CKD and diabetic foot Last hemoglobin A1c 7 on 11/09/2021.  Home medication regimen includes Jardiance 10 mg daily, Humalog sliding scale, and Tresiba 90 units daily. Currently on sliding scale insulin. Monitor   Hidradenitis suppurativa Followed by dermatology in the outpatient setting and recently started on cefdinir. -Continue cefdinir -Added probiotic   Hypertriglyceridemia Last lipid panel was 11/09/2021 noted HDL 23, LDL 39, triglyceride 373. -Continue simvastatin and fenofibrate     GERD -Protonix as noted above   Subjective: No nausea no vomiting no fever no chills.  Reports fatigue and tiredness.  Physical Exam: Vitals:   12/22/21 0906 12/22/21 0937 12/22/21 1319 12/22/21 1634  BP: (!) 121/59 125/67 (!) 165/65 133/71  Pulse: 74 77 72 73  Resp: 18 17 18 18   Temp: 97.6 F (36.4 C) 97.6 F (36.4 C) 98.2 F (36.8 C) (!) 97.4 F (36.3 C)  TempSrc: Oral Oral Oral Oral  SpO2: 100% 98% 99% 96%  Weight:      Height:       General: Appear in mild distress; no visible Abnormal Neck Mass Or lumps, Conjunctiva normal Cardiovascular: S1 and S2 Present, aortic systolic  Murmur, Respiratory: good respiratory effort, Bilateral Air entry present and CTA, no Crackles, no wheezes Abdomen: Bowel Sound present, Non tender  Extremities: Chronic right-sided pedal edema,  Neurology: alert and oriented to time, place, and person  Gait not checked due to patient safety concerns   Data Reviewed: I have Reviewed nursing notes, Vitals, and Lab results since pt's last encounter. Pertinent lab results CBC and BMP I have ordered test  including CBC and BMP    Family Communication: No one at bedside  Disposition: Status is: Inpatient Remains inpatient appropriate because: Getting blood transfusion and IV infusion of iron  Author: Berle Mull, MD 12/22/2021 6:42 PM  Please look on [email or web address] to find out who is on call.

## 2021-12-22 NOTE — Hospital Course (Signed)
Austin Alexander is a 51 y.o. male with medical history significant of hypertension, hypertriglyceridemia, systolic congestive heart failure last EF 20% in 2015, diabetes mellitus type 2, COPD, tobacco abuse, and alcohol use who presented due to having abnormal lab work.  He has seen his nephrologist Dr. Osborne Casco on Friday for routine visit and during that time had routine blood work done.  He is called this morning and told that his hemoglobin was down to 6.7 and that he needed to come to the hospital for further evaluation.  Received 2 PRBC and IV iron infusion. GI consulted currently undergoing capsule endoscopy.

## 2021-12-23 DIAGNOSIS — E1165 Type 2 diabetes mellitus with hyperglycemia: Secondary | ICD-10-CM

## 2021-12-23 DIAGNOSIS — D500 Iron deficiency anemia secondary to blood loss (chronic): Secondary | ICD-10-CM | POA: Diagnosis not present

## 2021-12-23 DIAGNOSIS — N179 Acute kidney failure, unspecified: Secondary | ICD-10-CM | POA: Diagnosis not present

## 2021-12-23 DIAGNOSIS — D649 Anemia, unspecified: Secondary | ICD-10-CM | POA: Diagnosis not present

## 2021-12-23 DIAGNOSIS — Z794 Long term (current) use of insulin: Secondary | ICD-10-CM

## 2021-12-23 LAB — CBC
HCT: 26.2 % — ABNORMAL LOW (ref 39.0–52.0)
Hemoglobin: 8 g/dL — ABNORMAL LOW (ref 13.0–17.0)
MCH: 22.9 pg — ABNORMAL LOW (ref 26.0–34.0)
MCHC: 30.5 g/dL (ref 30.0–36.0)
MCV: 75.1 fL — ABNORMAL LOW (ref 80.0–100.0)
Platelets: 334 10*3/uL (ref 150–400)
RBC: 3.49 MIL/uL — ABNORMAL LOW (ref 4.22–5.81)
RDW: 19.4 % — ABNORMAL HIGH (ref 11.5–15.5)
WBC: 8.6 10*3/uL (ref 4.0–10.5)
nRBC: 0 % (ref 0.0–0.2)

## 2021-12-23 LAB — GLUCOSE, CAPILLARY
Glucose-Capillary: 83 mg/dL (ref 70–99)
Glucose-Capillary: 199 mg/dL — ABNORMAL HIGH (ref 70–99)

## 2021-12-23 LAB — BASIC METABOLIC PANEL
Anion gap: 7 (ref 5–15)
BUN: 23 mg/dL — ABNORMAL HIGH (ref 6–20)
CO2: 21 mmol/L — ABNORMAL LOW (ref 22–32)
Calcium: 8.6 mg/dL — ABNORMAL LOW (ref 8.9–10.3)
Chloride: 109 mmol/L (ref 98–111)
Creatinine, Ser: 2.2 mg/dL — ABNORMAL HIGH (ref 0.61–1.24)
GFR, Estimated: 35 mL/min — ABNORMAL LOW (ref >60)
Glucose, Bld: 84 mg/dL (ref 70–99)
Potassium: 4.9 mmol/L (ref 3.5–5.1)
Sodium: 137 mmol/L (ref 135–145)

## 2021-12-23 LAB — BPAM RBC
Blood Product Expiration Date: 202309252359
Blood Product Expiration Date: 202309262359
ISSUE DATE / TIME: 202309111626
ISSUE DATE / TIME: 202309120918
Unit Type and Rh: 7300
Unit Type and Rh: 7300

## 2021-12-23 LAB — TYPE AND SCREEN
ABO/RH(D): B POS
Antibody Screen: NEGATIVE
Unit division: 0
Unit division: 0

## 2021-12-23 LAB — MAGNESIUM: Magnesium: 2.4 mg/dL (ref 1.7–2.4)

## 2021-12-23 MED ORDER — PANTOPRAZOLE SODIUM 40 MG PO TBEC: 40.0000 mg | DELAYED_RELEASE_TABLET | Freq: Every day | ORAL | 1 refills | Status: AC

## 2021-12-23 MED ORDER — CEFDINIR 300 MG PO CAPS: 300.0000 mg | ORAL_CAPSULE | Freq: Two times a day (BID) | ORAL | 0 refills | Status: AC

## 2021-12-23 MED ORDER — FUROSEMIDE 40 MG PO TABS: 40.0000 mg | ORAL_TABLET | Freq: Every day | ORAL | 0 refills | Status: DC

## 2021-12-23 NOTE — Progress Notes (Signed)
Pt asked if he could drive himself home after discharge. RN spoke with Dr. Cyndia Skeeters who advised him NOT to drive home and to get transportation. RN offered to get the patient a ride home but pt stated he drove himself here and he will drive himself home, pt stated, "it's only short way". Advised pt to not take the risk but pt is adamant to not leave car in the parking garage.

## 2021-12-23 NOTE — Progress Notes (Signed)
Austin Alexander 9:57 AM  Subjective: Patient tolerated the catheter well and has no new complaints and wants to eat no signs of bleeding and no other GI complaints  Objective: Vital signs stable afebrile no acute distress abdomen is soft nontender labs stable  Assessment: Guaiac positive iron deficiency  Plan: We will repeat capsule endoscopy when downloaded either later today or tomorrow and call patient with results discussed follow-up at that point otherwise consider setting up periodic IV iron or increasing dose of iron at home and my partner Dr. Alessandra Bevels happy to see back as an outpatient in a month unless capsule requires something sooner  [facility] E  office [phone] After 5PM or if no answer call [phone]

## 2021-12-23 NOTE — Discharge Summary (Addendum)
Physician Discharge Summary  Austin Alexander BCW:[id number] DOB: 04/28/1970 DOA: 12/21/2021  PCP: Rogers Blocker, MD  Admit date: 12/21/2021 Discharge date: 12/23/2021 Admitted From: Home Disposition: Home Recommendations for Outpatient Follow-up:  Follow up with PCP in 1 to 2 weeks [facility] to arrange outpatient follow-up Recommend outpatient evaluation for possible liver cirrhosis Check CBC at follow-up GI suggested periodic IV iron Please follow up on the following pending results: Capsule endoscopy result  Home Health: None Equipment/Devices: None  Discharge Condition: Stable CODE STATUS: Full code  Follow-up Information     Rogers Blocker, MD. Schedule an appointment as soon as possible for a visit in 1 week(s).   Specialty: Internal Medicine Contact information: [place] [id number] [phone]                 Hospital course 51 year old M with PMH of systolic CHF, DM-2, COPD, HTN, HLD, gastritis, colonic polyps, tobacco use disorder and morbid obesity directed to ED by nephrology due to low hemoglobin of 6.7.  Hemoccult was positive.  GI consulted.  Anemia panel with severe iron deficiency.  CT abdomen and pelvis without acute finding but raises concern for possible liver cirrhosis.  He was transfused 2 units and hemoglobin only improved to 6.9.  However, hemoglobin improved to 8.0 after the third unit.  He also received IV iron x2 on 9/12 and 9/13.  Patient has not had further GI bleed.  He was evaluated by GI and had capsule endoscopy.  He was cleared for discharge by GI for outpatient follow-up.  Capsule endoscopy results pending at the time of discharge.  Patient had mild AKI that has improved at the time of discharge.  On the day of discharge, patient felt well and ready to go home.  Low-dose aspirin discontinued on discharge.   See individual problem list below for more.   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 Recent Labs    02/05/21 0836 05/11/21 0811 08/05/21 0800 11/09/21 0851 12/21/21 0950 12/22/21 0225 12/23/21 0242  BUN 33* 35* 29* 30* 32* 30* 23*  CREATININE 2.11* 2.06* 1.82* 1.85* 2.34* 2.24* 2.20*     Chronic systolic CHF: Stable. -Continue home medications -Outpatient follow-up with his cardiologist  Essential hypertension: Normotensive. -Continue home meds   Uncontrolled IDDM-2 with hyperglycemia and CKD-3B: A1c 7.0 on 11/09/2021 Recent Labs  Lab 12/22/21 1149 12/22/21 1634 12/22/21 2023 12/23/21 0742 12/23/21 1124  GLUCAP 108* 96 116* 83 199*  -Continue home meds   Hidradenitis suppurativa: Seen by dermatology and started on cefdinir the day of admission. -Continue home cefdinir -Outpatient follow-up with dermatology   Hypertriglyceridemia -Continue home meds   GERD/gastritis -Protonix as noted above   Possible liver cirrhosis: CT abdomen with mildly nodular hepatic contour raising concern for possible cirrhosis. -Outpatient evaluation by GI  Morbid obesity Body mass index is 37.17 kg/m.            Vital signs Vitals:   12/22/21 2025 12/23/21 0549 12/23/21 0550 12/23/21 0940  BP: 131/67  (!) 141/66 132/74  Pulse:  74  69 73  Temp: 98 F (36.7 C)  (!) 97.5 F (36.4 C) 98.4 F (36.9 C)  Resp: '18  18 17  ' Height:      Weight:  120.9 kg    SpO2: 96%  99% 99%  TempSrc: Oral  Oral   BMI (Calculated):  37.19       Discharge exam  GENERAL: No apparent distress.   Nontoxic. HEENT: MMM.  Vision and hearing grossly intact.  NECK: Supple.  No apparent JVD.  RESP:  No IWOB.  Fair aeration bilaterally. CVS:  RRR. Heart sounds normal.  ABD/GI/GU: BS+. Abd soft, NTND.  MSK/EXT:  Moves extremities. No apparent deformity.  Trace BLE edema. SKIN: no apparent skin lesion or wound NEURO: Awake and alert. Oriented appropriately.  No apparent focal neuro deficit. PSYCH: Calm. Normal affect.   Discharge Instructions Discharge Instructions     (HEART FAILURE PATIENTS) Call MD:  Anytime you have any of the following symptoms: 1) 3 pound weight gain in 24 hours or 5 pounds in 1 week 2) shortness of breath, with or without a dry hacking cough 3) swelling in the hands, feet or stomach 4) if you have to sleep on extra pillows at night in order to breathe.   Complete by: As directed    Call MD for:  difficulty breathing, headache or visual disturbances   Complete by: As directed    Call MD for:  extreme fatigue   Complete by: As directed    Call MD for:  persistant dizziness or light-headedness   Complete by: As directed    Diet - low sodium heart healthy   Complete by: As directed    Diet Carb Modified   Complete by: As directed    Discharge instructions   Complete by: As directed    It has been a pleasure taking care of you!  You were hospitalized due to anemia for which you have been treated with blood transfusion.  Your hemoglobin improved to 8.0.  Gastroenterologist will follow-up on capsule endoscopy for further evaluation.  Meanwhile, would recommend you stop taking aspirin.  We have started you on Protonix to reduce your risk of bleeding.  We also recommend holding your Lasix for 4 days to allow your kidney functions recover.  Please review your new medication list and the directions on your medications before you take them.  Follow-up with your primary care doctor in 1 to 2 weeks or sooner if needed.  Follow-up with gastroenterologist per their  recommendation.  Avoid any over-the-counter pain medication other than plain Tylenol.   Take care,   Increase activity slowly   Complete by: As directed       Allergies as of 12/23/2021   No Known Allergies      Medication List     STOP taking these medications    aspirin EC 81 MG tablet       TAKE these medications    Accu-Chek FastClix Lancets Misc USE AS INSTRUCTED TO CHECK BLOOD SUGAR 2 TIMES DAILY.   Accu-Chek Guide Me w/Device Kit by Does not apply route.   Accu-Chek Guide test strip Generic drug: glucose blood Use as instructed to check blood sugar 2 times daily.   allopurinol 100 MG tablet Commonly known as: ZYLOPRIM Take 1 tablet (100 mg total) by mouth daily.   B-D ULTRAFINE III SHORT PEN 31G X 8 MM Misc Generic drug: Insulin Pen Needle SMARTSIG:Pre-Filled Pen Syringe SUB-Q Daily   blood glucose meter kit  and supplies Dispense based on patient and insurance preference. Use up to four times daily as directed. (FOR ICD-9 250.00, 250.01).   carvedilol 25 MG tablet Commonly known as: COREG Take 25 mg by mouth 2 (two) times daily with a meal.   cefdinir 300 MG capsule Commonly known as: OMNICEF Take 1 capsule (300 mg total) by mouth 2 (two) times daily for 7 days. What changed:  how much to take when to take this   Dexcom G6 Transmitter Misc Change every 90 days   digoxin 0.125 MG tablet Commonly known as: LANOXIN Take 125 mcg by mouth daily.   fenofibrate 48 MG tablet Commonly known as: Tricor Take 1 tablet (48 mg total) by mouth daily.   FreeStyle Libre 2 Sensor Misc USE DEVICES EVERY 14 DAYS   Dexcom G6 Sensor Misc Use to monitor blood sugar, change after 10 days   Dexcom G7 Sensor Misc Change every 10 days   furosemide 40 MG tablet Commonly known as: LASIX Take 1 tablet (40 mg total) by mouth daily. Start taking on: December 27, 2021 What changed: These instructions start on December 27, 2021. If you are unsure what to do  until then, ask your doctor or other care provider.   HumaLOG KwikPen 100 UNIT/ML KwikPen Generic drug: insulin lispro INJECT 10 UNITS UNDER THE SKIN AT BREAKFAST, 20 TO 21 UNITS WITH LUNCH, AND 10 TO 12 UNITS WITH DINNER   icosapent Ethyl 1 g capsule Commonly known as: Vascepa Take 2 capsules (2 g total) by mouth 2 (two) times daily.   Jardiance 10 MG Tabs tablet Generic drug: empagliflozin Take 10 mg by mouth daily.   lidocaine 5 % Commonly known as: Lidoderm Place 1 patch onto the skin daily. Remove & Discard patch within 12 hours or as directed by MD   Ozempic (1 MG/DOSE) 4 MG/3ML Sopn Generic drug: Semaglutide (1 MG/DOSE) INJECT 1 MG UNDER THE SKIN EVERY WEEK   pantoprazole 40 MG tablet Commonly known as: Protonix Take 1 tablet (40 mg total) by mouth daily.   simvastatin 40 MG tablet Commonly known as: ZOCOR Take 1 tablet (40 mg total) by mouth daily.   Tyler Aas FlexTouch 200 UNIT/ML FlexTouch Pen Generic drug: insulin degludec ADMINISTER 90 UNITS UNDER THE SKIN EVERY DAY   triamcinolone acetonide 10 MG/ML injection Commonly known as: KENALOG Inject into the skin.        Consultations: Gastroenterology  Procedures/Studies: Capsule endoscopy   CT ABDOMEN PELVIS WO CONTRAST  Result Date: 12/22/2021 CLINICAL DATA:  Anemia EXAM: CT ABDOMEN AND PELVIS WITHOUT CONTRAST TECHNIQUE: Multidetector CT imaging of the abdomen and pelvis was performed following the standard protocol without IV contrast. RADIATION DOSE REDUCTION: This exam was performed according to the departmental dose-optimization program which includes automated exposure control, adjustment of the mA and/or kV according to patient size and/or use of iterative reconstruction technique. COMPARISON:  None Available. FINDINGS: Lower chest: Lung bases are over. Hepatobiliary: Mildly nodular hepatic contour. Gallbladder is unremarkable. No intrahepatic or extrahepatic ductal dilatation. Pancreas: Within normal  limits. Spleen: Within normal limits Adrenals/Urinary Tract: Adrenal glands are within normal limits. Kidneys are within normal limits. No renal, ureteral, or bladder calculi. No hydronephrosis. Bladder is within normal limits. Stomach/Bowel: Stomach is within normal limits. No evidence of bowel obstruction. Normal appendix (series 3/image 87). No colonic wall thickening or mass is evident on CT. Vascular/Lymphatic: No evidence of abdominal aortic aneurysm. Atherosclerotic calcifications of the abdominal aorta and branch vessels. No suspicious abdominopelvic lymphadenopathy. Reproductive: Prostate  is unremarkable. Other: No abdominopelvic ascites. Musculoskeletal: Degenerative changes of the visualized thoracolumbar spine. IMPRESSION: No colonic wall thickening or mass is evident on CT. Mildly nodular hepatic contour, raising the possibility of cirrhosis. Electronically Signed   By: Julian Hy M.D.   On: 12/22/2021 00:59       The results of significant diagnostics from this hospitalization (including imaging, microbiology, ancillary and laboratory) are listed below for reference.     Microbiology: No results found for this or any previous visit (from the past 240 hour(s)).   Labs:  CBC: Recent Labs  Lab 12/21/21 0950 12/21/21 2121 12/22/21 0225 12/23/21 0242  WBC 8.5  --  9.4 8.6  NEUTROABS 6.6  --   --   --   HGB 6.7* 7.4* 6.9* 8.0*  HCT 23.2* 24.6* 23.0* 26.2*  MCV 75.1*  --  74.0* 75.1*  PLT 387  --  345 334   BMP &GFR Recent Labs  Lab 12/21/21 0950 12/22/21 0225 12/23/21 0242  NA 134* 136 137  K 5.5* 5.1 4.9  CL 105 104 109  CO2 21* 21* 21*  GLUCOSE 156* 93 84  BUN 32* 30* 23*  CREATININE 2.34* 2.24* 2.20*  CALCIUM 8.7* 8.7* 8.6*  MG  --   --  2.4   Estimated Creatinine Clearance: 51.4 mL/min (A) (by C-G formula based on SCr of 2.2 mg/dL (H)). Liver & Pancreas: No results for input(s): "AST", "ALT", "ALKPHOS", "BILITOT", "PROT", "ALBUMIN" in the last 168  hours. No results for input(s): "LIPASE", "AMYLASE" in the last 168 hours. No results for input(s): "AMMONIA" in the last 168 hours. Diabetic: No results for input(s): "HGBA1C" in the last 72 hours. Recent Labs  Lab 12/22/21 1149 12/22/21 1634 12/22/21 2023 12/23/21 0742 12/23/21 1124  GLUCAP 108* 96 116* 83 199*   Cardiac Enzymes: No results for input(s): "CKTOTAL", "CKMB", "CKMBINDEX", "TROPONINI" in the last 168 hours. No results for input(s): "PROBNP" in the last 8760 hours. Coagulation Profile: No results for input(s): "INR", "PROTIME" in the last 168 hours. Thyroid Function Tests: No results for input(s): "TSH", "T4TOTAL", "FREET4", "T3FREE", "THYROIDAB" in the last 72 hours. Lipid Profile: No results for input(s): "CHOL", "HDL", "LDLCALC", "TRIG", "CHOLHDL", "LDLDIRECT" in the last 72 hours. Anemia Panel: Recent Labs    12/21/21 1545  VITAMINB12 1,366*  FOLATE >40.0  FERRITIN 9*  TIBC 374  IRON 22*  RETICCTPCT 2.2   Urine analysis:    Component Value Date/Time   COLORURINE YELLOW 05/22/2018 1139   APPEARANCEUR CLEAR 05/22/2018 1139   LABSPEC 1.020 05/22/2018 1139   PHURINE 5.0 05/22/2018 1139   GLUCOSEU NEGATIVE 05/22/2018 1139   HGBUR NEGATIVE 05/22/2018 1139   BILIRUBINUR NEGATIVE 05/22/2018 1139   KETONESUR NEGATIVE 05/22/2018 1139   PROTEINUR 30 (A) 08/18/2015 1110   UROBILINOGEN 0.2 05/22/2018 1139   NITRITE NEGATIVE 05/22/2018 1139   LEUKOCYTESUR NEGATIVE 05/22/2018 1139   Sepsis Labs: Invalid input(s): "PROCALCITONIN", "LACTICIDVEN"   SIGNED:  Mercy Riding, MD  [facility] 12/23/2021, 4:24 PM

## 2021-12-24 ENCOUNTER — Encounter (HOSPITAL_COMMUNITY): Payer: Self-pay | Admitting: Gastroenterology

## 2021-12-28 ENCOUNTER — Encounter (HOSPITAL_COMMUNITY): Payer: Self-pay | Admitting: Gastroenterology

## 2021-12-28 ENCOUNTER — Ambulatory Visit (HOSPITAL_COMMUNITY): Payer: 59 | Admitting: Anesthesiology

## 2021-12-28 ENCOUNTER — Ambulatory Visit (HOSPITAL_BASED_OUTPATIENT_CLINIC_OR_DEPARTMENT_OTHER): Payer: 59 | Admitting: Anesthesiology

## 2021-12-28 ENCOUNTER — Encounter (HOSPITAL_COMMUNITY)
Admission: RE | Disposition: A | Discharge status: Home / Self Care | Payer: Self-pay | Source: Home / Self Care | Attending: Gastroenterology

## 2021-12-28 ENCOUNTER — Ambulatory Visit (HOSPITAL_COMMUNITY)
Admission: RE | Admit: 2021-12-28 | Discharge: 2021-12-28 | Disposition: A | Discharge status: Home / Self Care | Payer: 59 | Attending: Gastroenterology | Admitting: Gastroenterology

## 2021-12-28 ENCOUNTER — Other Ambulatory Visit: Payer: Self-pay

## 2021-12-28 DIAGNOSIS — E119 Type 2 diabetes mellitus without complications: Secondary | ICD-10-CM | POA: Insufficient documentation

## 2021-12-28 DIAGNOSIS — J449 Chronic obstructive pulmonary disease, unspecified: Secondary | ICD-10-CM | POA: Diagnosis not present

## 2021-12-28 DIAGNOSIS — K5520 Angiodysplasia of colon without hemorrhage: Secondary | ICD-10-CM

## 2021-12-28 DIAGNOSIS — Z794 Long term (current) use of insulin: Secondary | ICD-10-CM | POA: Diagnosis not present

## 2021-12-28 DIAGNOSIS — K6389 Other specified diseases of intestine: Secondary | ICD-10-CM | POA: Insufficient documentation

## 2021-12-28 DIAGNOSIS — D500 Iron deficiency anemia secondary to blood loss (chronic): Secondary | ICD-10-CM | POA: Insufficient documentation

## 2021-12-28 DIAGNOSIS — K2970 Gastritis, unspecified, without bleeding: Secondary | ICD-10-CM | POA: Diagnosis not present

## 2021-12-28 DIAGNOSIS — I509 Heart failure, unspecified: Secondary | ICD-10-CM | POA: Insufficient documentation

## 2021-12-28 DIAGNOSIS — Z86718 Personal history of other venous thrombosis and embolism: Secondary | ICD-10-CM | POA: Diagnosis not present

## 2021-12-28 DIAGNOSIS — I110 Hypertensive heart disease with heart failure: Secondary | ICD-10-CM | POA: Diagnosis not present

## 2021-12-28 DIAGNOSIS — F17210 Nicotine dependence, cigarettes, uncomplicated: Secondary | ICD-10-CM | POA: Insufficient documentation

## 2021-12-28 DIAGNOSIS — K219 Gastro-esophageal reflux disease without esophagitis: Secondary | ICD-10-CM | POA: Insufficient documentation

## 2021-12-28 DIAGNOSIS — D649 Anemia, unspecified: Secondary | ICD-10-CM

## 2021-12-28 HISTORY — PX: ENTEROSCOPY: SHX5533

## 2021-12-28 HISTORY — PX: SUBMUCOSAL TATTOO INJECTION: SHX6856

## 2021-12-28 HISTORY — PX: HOT HEMOSTASIS: SHX5433

## 2021-12-28 HISTORY — PX: BALLOON ENTEROSCOPY: SHX6863

## 2021-12-28 LAB — GLUCOSE, CAPILLARY
Glucose-Capillary: 101 mg/dL — ABNORMAL HIGH (ref 70–99)
Glucose-Capillary: 25 mg/dL — CL (ref 70–99)
Glucose-Capillary: 50 mg/dL — ABNORMAL LOW (ref 70–99)
Glucose-Capillary: 30 mg/dL — CL (ref 70–99)
Glucose-Capillary: 66 mg/dL — ABNORMAL LOW (ref 70–99)
Glucose-Capillary: 96 mg/dL (ref 70–99)
Glucose-Capillary: 71 mg/dL (ref 70–99)
Glucose-Capillary: 82 mg/dL (ref 70–99)
Glucose-Capillary: 83 mg/dL (ref 70–99)
Glucose-Capillary: 87 mg/dL (ref 70–99)

## 2021-12-28 LAB — CBC
HCT: 32.7 % — ABNORMAL LOW (ref 39.0–52.0)
Hemoglobin: 9.4 g/dL — ABNORMAL LOW (ref 13.0–17.0)
MCH: 22.9 pg — ABNORMAL LOW (ref 26.0–34.0)
MCHC: 28.7 g/dL — ABNORMAL LOW (ref 30.0–36.0)
MCV: 79.8 fL — ABNORMAL LOW (ref 80.0–100.0)
Platelets: 455 10*3/uL — ABNORMAL HIGH (ref 150–400)
RBC: 4.1 MIL/uL — ABNORMAL LOW (ref 4.22–5.81)
RDW: 22.1 % — ABNORMAL HIGH (ref 11.5–15.5)
WBC: 11.8 10*3/uL — ABNORMAL HIGH (ref 4.0–10.5)
nRBC: 0 % (ref 0.0–0.2)

## 2021-12-28 SURGERY — ENTEROSCOPY
Anesthesia: Monitor Anesthesia Care | Laterality: Left

## 2021-12-28 SURGERY — ENTEROSCOPY
Anesthesia: General

## 2021-12-28 MED ORDER — DEXTROSE 50 % IV SOLN
INTRAVENOUS | Status: DC | PRN
  Administered 2021-12-28: 25 g via INTRAVENOUS

## 2021-12-28 MED ORDER — DEXTROSE 50 % IV SOLN
INTRAVENOUS | Status: AC
  Filled 2021-12-28: qty 50

## 2021-12-28 MED ORDER — SUCCINYLCHOLINE CHLORIDE 200 MG/10ML IV SOSY
PREFILLED_SYRINGE | INTRAVENOUS | Status: DC | PRN
  Administered 2021-12-28: 120 mg via INTRAVENOUS

## 2021-12-28 MED ORDER — PROPOFOL 10 MG/ML IV BOLUS
INTRAVENOUS | Status: AC
  Filled 2021-12-28: qty 20

## 2021-12-28 MED ORDER — LIDOCAINE 2% (20 MG/ML) 5 ML SYRINGE
INTRAMUSCULAR | Status: DC | PRN
  Administered 2021-12-28: 100 mg via INTRAVENOUS

## 2021-12-28 MED ORDER — PROPOFOL 10 MG/ML IV BOLUS
INTRAVENOUS | Status: DC | PRN
  Administered 2021-12-28: 200 mg via INTRAVENOUS

## 2021-12-28 MED ORDER — SPOT INK MARKER SYRINGE KIT
PACK | SUBMUCOSAL | Status: AC
  Filled 2021-12-28: qty 5

## 2021-12-28 MED ORDER — SODIUM CHLORIDE 0.9 % IV SOLN: INTRAVENOUS | Status: DC

## 2021-12-28 MED ORDER — PHENYLEPHRINE 80 MCG/ML (10ML) SYRINGE FOR IV PUSH (FOR BLOOD PRESSURE SUPPORT)
PREFILLED_SYRINGE | INTRAVENOUS | Status: DC | PRN
  Administered 2021-12-28: 240 ug via INTRAVENOUS
  Administered 2021-12-28: 160 ug via INTRAVENOUS
  Administered 2021-12-28: 240 ug via INTRAVENOUS

## 2021-12-28 MED ORDER — DEXTROSE 50 % IV SOLN
25.0000 mL | Freq: Once | INTRAVENOUS | Status: AC
  Administered 2021-12-28: 25 mL via INTRAVENOUS

## 2021-12-28 MED ORDER — DEXTROSE 50 % IV SOLN
50.0000 mL | Freq: Once | INTRAVENOUS | Status: AC
  Administered 2021-12-28: 50 mL via INTRAVENOUS

## 2021-12-28 MED ORDER — ONDANSETRON HCL 4 MG/2ML IJ SOLN
INTRAMUSCULAR | Status: DC | PRN
  Administered 2021-12-28: 4 mg via INTRAVENOUS

## 2021-12-28 MED ORDER — LACTATED RINGERS IV SOLN: INTRAVENOUS | Status: DC | PRN

## 2021-12-28 MED ORDER — SPOT INK MARKER SYRINGE KIT
PACK | SUBMUCOSAL | Status: DC | PRN
  Administered 2021-12-28: 3 mL via SUBMUCOSAL

## 2021-12-28 MED ORDER — EPHEDRINE SULFATE-NACL 50-0.9 MG/10ML-% IV SOSY
PREFILLED_SYRINGE | INTRAVENOUS | Status: DC | PRN
  Administered 2021-12-28: 10 mg via INTRAVENOUS
  Administered 2021-12-28: 15 mg via INTRAVENOUS

## 2021-12-28 MED ORDER — DEXAMETHASONE SODIUM PHOSPHATE 10 MG/ML IJ SOLN
INTRAMUSCULAR | Status: DC | PRN
  Administered 2021-12-28: 10 mg via INTRAVENOUS

## 2021-12-28 NOTE — Interval H&P Note (Signed)
History and Physical Interval Note:  Patient was admitted with acute blood loss anemia.  Recent unrevealing EGD and colonoscopy in March of this year.  Was transfused 3 unit PRBCs with good serologic response.  VCE was performed, but results not back until after he was discharged.  VCE demonstrates active bleeding in the proximal to mid small bowel, approximately 50 minutes beyond first duodenal image.  Patient was referred to me by Dr. Watt Climes for single balloon enteroscopy for diagnostic and therapeutic intent.  12/28/2021 1:42 PM  Austin Alexander  has presented today for surgery, with the diagnosis of small bowel bleed.  The various methods of treatment have been discussed with the patient and family. After consideration of risks, benefits and other options for treatment, the patient has consented to  Procedure(s) with comments: ENTEROSCOPY (N/A) - single balloon enteroscopy as a surgical intervention.  The patient's history has been reviewed, patient examined, no change in status, stable for surgery.  I have reviewed the patient's chart and labs.  Questions were answered to the patient's satisfaction.     Dominic Pea Sanyia Dini

## 2021-12-28 NOTE — Progress Notes (Signed)
Dr. Christella Hartigan notified of patients trending blood sugars. Per MD ok to discharge patient at this time. Patient educated on the importance of getting him a meal when he leaves and checking his blood sugar when he gets home. Both the patient and his cousin verbalize understanding. Patient understands if his sugar drops low and continue to stay low he needs to seek help. Patient is stable at this time and not complaining of any symptoms. Will discharge home at this time.

## 2021-12-28 NOTE — Anesthesia Postprocedure Evaluation (Signed)
Anesthesia Post Note  Patient: Austin Alexander  Procedure(s) Performed: SINGLE BALLOON ENTEROSCOPY BALLOON ENTEROSCOPY SUBMUCOSAL TATTOO INJECTION HOT HEMOSTASIS (ARGON PLASMA COAGULATION/BICAP)     Patient location during evaluation: Endoscopy Anesthesia Type: General Level of consciousness: awake and alert Pain management: pain level controlled Vital Signs Assessment: post-procedure vital signs reviewed and stable Respiratory status: spontaneous breathing, nonlabored ventilation and respiratory function stable Cardiovascular status: blood pressure returned to baseline and stable Postop Assessment: no apparent nausea or vomiting Anesthetic complications: no   No notable events documented.  Last Vitals:  Vitals:   12/28/21 1520 12/28/21 1530  BP: (!) 147/80 (!) 152/80  Pulse: 82 78  Resp: (!) 24 (!) 24  Temp:    SpO2: 94% 94%    Last Pain:  Vitals:   12/28/21 1530  TempSrc:   PainSc: 0-No pain                 Lidia Collum

## 2021-12-28 NOTE — Discharge Instructions (Signed)
YOU HAD AN ENDOSCOPIC PROCEDURE TODAY: Refer to the procedure report and other information in the discharge instructions given to you for any specific questions about what was found during the examination. If this information does not answer your questions, please call [facility] office at [phone] to clarify.   YOU SHOULD EXPECT: Some feelings of bloating in the abdomen. Passage of more gas than usual. Walking can help get rid of the air that was put into your GI tract during the procedure and reduce the bloating. If you had a lower endoscopy (such as a colonoscopy or flexible sigmoidoscopy) you may notice spotting of blood in your stool or on the toilet paper. Some abdominal soreness may be present for a day or two, also.  DIET: Your first meal following the procedure should be a light meal and then it is ok to progress to your normal diet. A half-sandwich or bowl of soup is an example of a good first meal. Heavy or fried foods are harder to digest and may make you feel nauseous or bloated. Drink plenty of fluids but you should avoid alcoholic beverages for 24 hours. If you had a esophageal dilation, please see attached instructions for diet.    ACTIVITY: Your care partner should take you home directly after the procedure. You should plan to take it easy, moving slowly for the rest of the day. You can resume normal activity the day after the procedure however YOU SHOULD NOT DRIVE, use power tools, machinery or perform tasks that involve climbing or major physical exertion for 24 hours (because of the sedation medicines used during the test).   SYMPTOMS TO REPORT IMMEDIATELY: A gastroenterologist can be reached at any hour. Please call [phone]  for any of the following symptoms:   Following upper endoscopy (EGD, EUS, ERCP, esophageal dilation) Vomiting of blood or coffee ground material  New, significant abdominal pain  New, significant chest pain or pain under the shoulder blades  Painful or  persistently difficult swallowing  New shortness of breath  Black, tarry-looking or red, bloody stools  FOLLOW UP:  If any biopsies were taken you will be contacted by phone or by letter within the next 1-3 weeks. Call [phone]  if you have not heard about the biopsies in 3 weeks.  Please also call with any specific questions about appointments or follow up tests.  

## 2021-12-28 NOTE — Progress Notes (Addendum)
Blood sugar low at 25 on admission with fluctuating readings between normal and then low again. Dr. Christella Hartigan is aware. Pt now in recovery last blood sugar is 66 down from 96 after half a amp of dextrose. Orange juice, cranberry juice, graham crackers and peanut butter, as well as a sandwich given to patient. Per Dr. Christella Hartigan it would be ideal to get a few consecutive readings of patients blood sugar before allowing him to be discharge. Situation explained to patient and he verbalizes being able to take his sugar at home in the event he can be discharged today. Patient is otherwise stable at this time. Plan to recheck sugar and reassess discharge status.

## 2021-12-28 NOTE — Op Note (Signed)
[facility] Patient Name: Austin Alexander Procedure Date: 12/28/2021 MRN: [id number] Attending MD: Gerrit Heck , MD Date of Birth: 1970/09/11 CSN: [id number] Age: 51 Admit Type: Outpatient Procedure:                Single balloon enteroscopy w/ control of bleeding                            and submucosal injection Indications:              Abnormal video capsule endoscopy, Iron deficiency                            anemia secondary to chronic blood loss                           51 yo male recently admitted with acute blood loss                            anemia. Recent unrevealing EGD and colonoscopy in                            March of this year. Was transfused 3 unit PRBCs                            with good serologic response. VCE was performed,                            but results not back until after he was discharged.                            VCE demonstrates active bleeding in the proximal to                            mid small bowel, approximately 50 minutes beyond                            first duodenal image. Patient was referred to me by                            Dr. Watt Climes for single balloon enteroscopy for                            diagnostic and therapeutic intent. Providers:                Gerrit Heck, MD, Benay Pillow, RN, [facility]                            Technician, Technician, Brien Mates, RNFA Referring MD:             Clarene Essex, MD Medicines:                General Anesthesia Complications:            No immediate complications. Estimated Blood Loss:     Estimated blood loss was minimal. Procedure:  Pre-Anesthesia Assessment:                           - Prior to the procedure, a History and Physical                            was performed, and patient medications and                            allergies were reviewed. The patient's tolerance of                            previous anesthesia was also  reviewed. The risks                            and benefits of the procedure and the sedation                            options and risks were discussed with the patient.                            All questions were answered, and informed consent                            was obtained. Prior Anticoagulants: The patient has                            taken no previous anticoagulant or antiplatelet                            agents. ASA Grade Assessment: III - A patient with                            severe systemic disease. After reviewing the risks                            and benefits, the patient was deemed in                            satisfactory condition to undergo the procedure.                           After obtaining informed consent, the endoscope was                            passed under direct vision. Throughout the                            procedure, the patient's blood pressure, pulse, and                            oxygen saturations were monitored continuously. The  [id number] ([id number]) Olympus enteroscope was                            introduced through the mouth and advanced to the                            deep small bowel. Once the enteroscope could not be                            advanced any further, the balloon overtube was                            engaged. Through a series of inflation/deflations                            and advancement/reductions, the enteroscope was                            advanecd into the mid-jejunum. Advanced the                            enteroscope approximately 120 cm beyond the                            pylrous, until no further advancement could be                            achieved. This marked the distal extent reached.                            The enteroscope was then slowly withdrawn with                            careful examination. The small bowel enteroscopy                             was accomplished without difficulty. The patient                            tolerated the procedure well. Scope In: Scope Out: Findings:      The examined esophagus was normal.      Mild inflammation characterized by congestion (edema) was found in the       gastric fundus and in the gastric body. No ulcers or stigmata of       bleeding noted, albeing on limited views.      There was no evidence of significant pathology in the entire examined       duodenum.      Two angioectasias with no bleeding were found in the proximal jejunum       and in the mid-jejunum. Coagulation for hemostasis using argon plasma at       2 liters/minute and 20 watts was successful. Estimated blood loss: none.      The mucosa was otherwise normal appearing throughout the remainder of       the jejunum. Advanced the enteroscope  to about 120 cm beyond the       pylorus, which was the distal extent reached. This area was tattooed       with an injection of 3 mL of Spot (carbon black) to mark distal extent       reached on this study. Impression:               - Normal esophagus.                           - Gastritis.                           - Normal examined duodenum.                           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                            Treated with argon plasma coagulation (APC).                           - Normal mucosa was found in the jejunum. Tattooed                            the distal extent reached.                           - No specimens collected. Recommendation:           - Discharge patient to home (with escort).                           - Advance diet as tolerated.                           - Continue present medications.                           - Follow-up with Dr. Watt Climes or Dr. Luan Pulling in the                            [facility] at appointment to be scheduled.                           - If concern for rebleeding, can consider repeat                             VCE to determine if new bleed and if proximal or                            distal to the tattoo placed during today's study. Procedure Code(s):        --- Professional ---                           [phone], Small intestinal endoscopy, enteroscopy  beyond second portion of duodenum, not including                            ileum; with control of bleeding (eg, injection,                            bipolar cautery, unipolar cautery, laser, heater                            probe, stapler, plasma coagulator)                           44799, Unlisted procedure, small intestine Diagnosis Code(s):        --- Professional ---                           K29.70, Gastritis, unspecified, without bleeding                           K55.20, Angiodysplasia of colon without hemorrhage                           D50.0, Iron deficiency anemia secondary to blood                            loss (chronic)                           R93.3, Abnormal findings on diagnostic imaging of                            other parts of digestive tract CPT copyright 2019 [employer]. All rights reserved. The codes documented in this report are preliminary and upon coder review may  be revised to meet current compliance requirements. Gerrit Heck, MD 12/28/2021 2:43:23 PM Number of Addenda: 0

## 2021-12-28 NOTE — Anesthesia Preprocedure Evaluation (Addendum)
Anesthesia Evaluation  Patient identified by MRN, date of birth, ID band Patient awake    Reviewed: Allergy & Precautions, NPO status , Patient's Chart, lab work & pertinent test results, reviewed documented beta blocker date and time   History of Anesthesia Complications Negative for: history of anesthetic complications  Airway Mallampati: II  TM Distance: >3 FB Neck ROM: Full    Dental  (+) Dental Advisory Given   Pulmonary COPD, Current Smoker and Patient abstained from smoking.,    Pulmonary exam normal        Cardiovascular hypertension, Pt. on medications and Pt. on home beta blockers +CHF (EF 25-30% in 2015) and + DVT  Normal cardiovascular exam     Neuro/Psych negative neurological ROS     GI/Hepatic Neg liver ROS, GERD  ,Small bowel GI bleed   Endo/Other  negative endocrine ROSdiabetes, Type 2, Insulin Dependent  Renal/GU Renal disease  negative genitourinary   Musculoskeletal negative musculoskeletal ROS (+)   Abdominal   Peds  Hematology  (+) Blood dyscrasia, anemia ,   Anesthesia Other Findings Ozempic, last dose 9/13  Reproductive/Obstetrics                            Anesthesia Physical Anesthesia Plan  ASA: 3  Anesthesia Plan: General   Post-op Pain Management: Minimal or no pain anticipated   Induction: Intravenous and Rapid sequence  PONV Risk Score and Plan: 1 and Ondansetron, Dexamethasone, Treatment may vary due to age or medical condition and Midazolam  Airway Management Planned: Oral ETT  Additional Equipment: None  Intra-op Plan:   Post-operative Plan: Extubation in OR  Informed Consent: I have reviewed the patients History and Physical, chart, labs and discussed the procedure including the risks, benefits and alternatives for the proposed anesthesia with the patient or authorized representative who has indicated his/her understanding and acceptance.      Dental advisory given  Plan Discussed with:   Anesthesia Plan Comments:         Anesthesia Quick Evaluation

## 2021-12-28 NOTE — Anesthesia Procedure Notes (Deleted)
Spinal  Patient location during procedure: OR Reason for block: surgical anesthesia Staffing Performed: anesthesiologist  Anesthesiologist: Kort Stettler E, MD Performed by: Si Jachim E, MD Authorized by: Mccade Sullenberger E, MD   Preanesthetic Checklist Completed: patient identified, IV checked, risks and benefits discussed, surgical consent, monitors and equipment checked, pre-op evaluation and timeout performed Spinal Block Patient position: sitting Prep: DuraPrep and site prepped and draped Patient monitoring: continuous pulse ox, blood pressure and heart rate Approach: midline Location: L3-4 Injection technique: single-shot Needle Needle type: Pencan  Needle gauge: 24 G Needle length: 10 cm Assessment Events: CSF return Additional Notes Functioning IV was confirmed and monitors were applied. Sterile prep and drape, including hand hygiene and sterile gloves were used. The patient was positioned and the spine was prepped. The skin was anesthetized with lidocaine.  Free flow of clear CSF was obtained prior to injecting local anesthetic into the CSF. The needle was carefully withdrawn. The patient tolerated the procedure well.      

## 2021-12-28 NOTE — Anesthesia Procedure Notes (Signed)
Procedure Name: Intubation Date/Time: 12/28/2021 1:47 PM  Performed by: Lind Covert, CRNAPre-anesthesia Checklist: Patient identified, Emergency Drugs available, Suction available, Patient being monitored and Timeout performed Patient Re-evaluated:Patient Re-evaluated prior to induction Oxygen Delivery Method: Circle system utilized Preoxygenation: Pre-oxygenation with 100% oxygen Induction Type: IV induction, Rapid sequence and Cricoid Pressure applied Laryngoscope Size: Mac and 4 Grade View: Grade I Tube type: Oral Tube size: 7.5 mm Number of attempts: 1 Airway Equipment and Method: Stylet Placement Confirmation: ETT inserted through vocal cords under direct vision, positive ETCO2 and breath sounds checked- equal and bilateral Secured at: 23 cm Tube secured with: Tape Dental Injury: Teeth and Oropharynx as per pre-operative assessment

## 2021-12-28 NOTE — Transfer of Care (Signed)
Immediate Anesthesia Transfer of Care Note  Patient: Austin Alexander  Procedure(s) Performed: ENTEROSCOPY BALLOON ENTEROSCOPY SUBMUCOSAL TATTOO INJECTION HOT HEMOSTASIS (ARGON PLASMA COAGULATION/BICAP)  Patient Location: PACU  Anesthesia Type:General  Level of Consciousness: sedated  Airway & Oxygen Therapy: Patient Spontanous Breathing and Patient connected to face mask oxygen  Post-op Assessment: Report given to RN and Post -op Vital signs reviewed and stable  Post vital signs: Reviewed and stable  Last Vitals:  Vitals Value Taken Time  BP 122/55 12/28/21 1447  Temp    Pulse 77 12/28/21 1447  Resp 18 12/28/21 1447  SpO2 99 % 12/28/21 1447  Vitals shown include unvalidated device data.  Last Pain:  Vitals:   12/28/21 1240  TempSrc: Temporal  PainSc: 0-No pain         Complications: No notable events documented.

## 2021-12-29 ENCOUNTER — Encounter (HOSPITAL_COMMUNITY): Payer: Self-pay | Admitting: Gastroenterology

## 2021-12-31 ENCOUNTER — Other Ambulatory Visit: Payer: Self-pay | Admitting: Endocrinology

## 2021-12-31 DIAGNOSIS — E1165 Type 2 diabetes mellitus with hyperglycemia: Secondary | ICD-10-CM

## 2022-01-04 ENCOUNTER — Other Ambulatory Visit: Payer: Self-pay | Admitting: Endocrinology

## 2022-01-04 ENCOUNTER — Other Ambulatory Visit: Payer: Self-pay

## 2022-01-04 DIAGNOSIS — E1165 Type 2 diabetes mellitus with hyperglycemia: Secondary | ICD-10-CM

## 2022-01-04 MED ORDER — DEXCOM G7 SENSOR MISC: 3 refills | Status: DC

## 2022-01-15 ENCOUNTER — Telehealth: Payer: Self-pay | Admitting: Hematology and Oncology

## 2022-01-15 NOTE — Telephone Encounter (Signed)
Scheduled appt per 10/6 referral. Pt is aware of appt date and time. Pt is aware to arrive 15 mins prior to appt time and to bring and updated insurance card. Pt is aware of appt location.   

## 2022-01-25 ENCOUNTER — Other Ambulatory Visit: Payer: Self-pay | Admitting: Endocrinology

## 2022-01-25 ENCOUNTER — Other Ambulatory Visit (INDEPENDENT_AMBULATORY_CARE_PROVIDER_SITE_OTHER): Payer: 59

## 2022-01-25 DIAGNOSIS — E1165 Type 2 diabetes mellitus with hyperglycemia: Secondary | ICD-10-CM

## 2022-01-25 DIAGNOSIS — E782 Mixed hyperlipidemia: Secondary | ICD-10-CM | POA: Diagnosis not present

## 2022-01-25 DIAGNOSIS — Z794 Long term (current) use of insulin: Secondary | ICD-10-CM

## 2022-01-25 LAB — LIPID PANEL
Cholesterol: 146 mg/dL (ref 0–200)
HDL: 33.3 mg/dL — ABNORMAL LOW (ref >39.00)
NonHDL: 113.15
Total CHOL/HDL Ratio: 4
Triglycerides: 213 mg/dL — ABNORMAL HIGH (ref 0.0–149.0)
VLDL: 42.6 mg/dL — ABNORMAL HIGH (ref 0.0–40.0)

## 2022-01-25 LAB — BASIC METABOLIC PANEL
BUN: 27 mg/dL — ABNORMAL HIGH (ref 6–23)
CO2: 23 mEq/L (ref 19–32)
Calcium: 9.2 mg/dL (ref 8.4–10.5)
Chloride: 102 mEq/L (ref 96–112)
Creatinine, Ser: 1.7 mg/dL — ABNORMAL HIGH (ref 0.40–1.50)
GFR: 45.47 mL/min — ABNORMAL LOW (ref >60.00)
Glucose, Bld: 82 mg/dL (ref 70–99)
Potassium: 4.8 mEq/L (ref 3.5–5.1)
Sodium: 135 mEq/L (ref 135–145)

## 2022-01-25 LAB — LDL CHOLESTEROL, DIRECT: Direct LDL: 76 mg/dL

## 2022-01-26 LAB — HEMOGLOBIN A1C: Hgb A1c MFr Bld: 7.2 % — ABNORMAL HIGH (ref 4.6–6.5)

## 2022-01-26 LAB — FRUCTOSAMINE: Fructosamine: 249 umol/L (ref 0–285)

## 2022-01-28 ENCOUNTER — Other Ambulatory Visit (HOSPITAL_COMMUNITY): Payer: Self-pay

## 2022-01-28 ENCOUNTER — Encounter: Payer: Self-pay | Admitting: Endocrinology

## 2022-01-28 ENCOUNTER — Ambulatory Visit (INDEPENDENT_AMBULATORY_CARE_PROVIDER_SITE_OTHER): Payer: 59 | Admitting: Endocrinology

## 2022-01-28 VITALS — BP 124/72 | HR 69 | Ht 71.0 in | Wt 276.8 lb

## 2022-01-28 DIAGNOSIS — E782 Mixed hyperlipidemia: Secondary | ICD-10-CM

## 2022-01-28 DIAGNOSIS — E1165 Type 2 diabetes mellitus with hyperglycemia: Secondary | ICD-10-CM | POA: Diagnosis not present

## 2022-01-28 DIAGNOSIS — Z794 Long term (current) use of insulin: Secondary | ICD-10-CM | POA: Diagnosis not present

## 2022-01-28 MED ORDER — OZEMPIC (1 MG/DOSE) 4 MG/3ML ~~LOC~~ SOPN
1.0000 mg | PEN_INJECTOR | SUBCUTANEOUS | 1 refills | Status: DC
  Filled 2022-01-28: qty 3, 28d supply, fill #0

## 2022-01-28 NOTE — Progress Notes (Signed)
Patient ID: Austin Alexander, male   DOB: April 11, 1971, 51 y.o.   MRN: [id number]           Reason for Appointment: Follow-up for Type 2 Diabetes   History of Present Illness:          Date of diagnosis of type 2 diabetes mellitus:  2009      Background history:   He is not clear when he was first diagnosed with diabetes Had been previously taking metformin and also for a few years Amaryl and Actos Insulin probably started in 2015 according to records and his A1c has been as high as 15.2 in the past He thinks Trulicity was added about a year ago At some point his A1c apparently has been 7-8% but does not know when  Recent history:    INSULIN regimen is: [place] 70 units daily, Humalog 20 units at lunch and dinner   Non-insulin hypoglycemic drugs the patient is taking are: Ozempic 1 mg weekly, Jardiance 10 mg daily  His A1c is 7  Current management, blood sugar patterns and problems identified: He has done very well with his diabetes control and now using the [place] He says that he takes 10 units of insulin to cover his coffee in the morning but this causes his blood sugars to be low about 2 to 3 hours later before lunch Otherwise most of his blood sugars are well controlled after meals He is however not adjusting Humalog based on total carbohydrates or meal size Overnight blood sugars are stable although somewhat variable and average sugar in the morning is mildly increased Time in range is however excellent at 78 He has had difficulty getting his Ozempic filled regularly and this may be causing some weight gain now Not able to exercise        Side effects from medications have been: None  Typical meal intake: Breakfast is frequently skipped, lunch is a largest meal        Interpretation of the Dexcom download for the last 2 weeks as follows  Blood sugars are mildly increased with some variability overnight without any hypoglycemia During the day blood sugars are mostly variable  midday otherwise on an average are generally averaging around 130-150 POSTPRANDIAL readings after lunch are somewhat variable with highest average only 162 However he has tendency to periodic hypoglycemia before lunch around 12-1 PM Postprandial readings after dinner are usually not rising excessively   CGM use % of time 57  2-week average/GV 140  Time in range       78 %  % Time Above 180 19  % Time above 250   % Time Below 70 2     PRE-MEAL Fasting Lunch Dinner Bedtime Overall  Glucose range:       Averages: 140 112      POST-MEAL PC Breakfast PC Lunch PC Dinner  Glucose range:     Averages:  162 143     Previous data:  CGM use % of time 89  2-week average/GV 141/32  Time in range 76, was 79%  % Time Above 180 20  % Time above 250 1.5  % Time Below 70 4.3     Dietician visit, most recent: years ago  Weight history:  Wt Readings from Last 3 Encounters:  01/28/22 276 lb 12.8 oz (125.6 kg)  12/28/21 268 lb (121.6 kg)  12/23/21 266 lb 8.6 oz (120.9 kg)    Glycemic control:   Lab Results  Component Value Date  HGBA1C 7.2 (H) 01/26/2022   HGBA1C 7.0 (H) 11/09/2021   HGBA1C 6.4 08/05/2021   Lab Results  Component Value Date   MICROALBUR 51.4 (H) 07/23/2020   LDLCALC 33 08/05/2021   CREATININE 1.70 (H) 01/25/2022   Lab Results  Component Value Date   MICRALBCREAT 110.7 (H) 07/23/2020    Lab Results  Component Value Date   FRUCTOSAMINE 249 01/25/2022   FRUCTOSAMINE 217 02/19/2020   FRUCTOSAMINE 346 (H) 07/20/2019    Lab on 01/25/2022  Component Date Value Ref Range Status   Cholesterol 01/25/2022 146  0 - 200 mg/dL Final   ATP III Classification       Desirable:  < 200 mg/dL               Borderline High:  200 - 239 mg/dL          High:  > = 240 mg/dL   Triglycerides 01/25/2022 213.0 (H)  0.0 - 149.0 mg/dL Final   Normal:  <150 mg/dLBorderline High:  150 - 199 mg/dL   HDL 01/25/2022 33.30 (L)  >39.00 mg/dL Final   VLDL 01/25/2022 42.6 (H)  0.0 -  40.0 mg/dL Final   Total CHOL/HDL Ratio 01/25/2022 4   Final                  Men          Women1/2 Average Risk     3.4          3.3Average Risk          5.0          4.42X Average Risk          9.6          7.13X Average Risk          15.0          11.0                       NonHDL 01/25/2022 113.15   Final   NOTE:  Non-HDL goal should be 30 mg/dL higher than patient's LDL goal (i.e. LDL goal of < 70 mg/dL, would have non-HDL goal of < 100 mg/dL)   Sodium 01/25/2022 135  135 - 145 mEq/L Final   Potassium 01/25/2022 4.8  3.5 - 5.1 mEq/L Final   Chloride 01/25/2022 102  96 - 112 mEq/L Final   CO2 01/25/2022 23  19 - 32 mEq/L Final   Glucose, Bld 01/25/2022 82  70 - 99 mg/dL Final   BUN 01/25/2022 27 (H)  6 - 23 mg/dL Final   Creatinine, Ser 01/25/2022 1.70 (H)  0.40 - 1.50 mg/dL Final   GFR 01/25/2022 45.47 (L)  >60.00 mL/min Final   Calculated using the CKD-EPI Creatinine Equation (2021)   Calcium 01/25/2022 9.2  8.4 - 10.5 mg/dL Final   Fructosamine 01/25/2022 249  0 - 285 umol/L Final   Comment: Published reference interval for apparently healthy subjects between age 44 and 26 is 42 - 285 umol/L and in a poorly controlled diabetic population is 228 - 563 umol/L with a mean of 396 umol/L.    Direct LDL 01/25/2022 76.0  mg/dL Final   Optimal:  <100 mg/dLNear or Above Optimal:  100-129 mg/dLBorderline High:  130-159 mg/dLHigh:  160-189 mg/dLVery High:  >190 mg/dL   Hgb A1c MFr Bld 01/26/2022 7.2 (H)  4.6 - 6.5 % Final   Glycemic Control Guidelines for People with Diabetes:Non Diabetic:  <6%Goal  of Therapy: <7%Additional Action Suggested:  >8%     Allergies as of 01/28/2022   No Known Allergies      Medication List        Accurate as of January 28, 2022 11:27 AM. If you have any questions, ask your nurse or doctor.          Accu-Chek FastClix Lancets Misc USE AS INSTRUCTED TO CHECK BLOOD SUGAR 2 TIMES DAILY.   Accu-Chek Guide Me w/Device Kit by Does not apply route.    Accu-Chek Guide test strip Generic drug: glucose blood Use as instructed to check blood sugar 2 times daily.   allopurinol 100 MG tablet Commonly known as: ZYLOPRIM Take 1 tablet (100 mg total) by mouth daily.   B-D ULTRAFINE III SHORT PEN 31G X 8 MM Misc Generic drug: Insulin Pen Needle SMARTSIG:Pre-Filled Pen Syringe SUB-Q Daily   blood glucose meter kit and supplies Dispense based on patient and insurance preference. Use up to four times daily as directed. (FOR ICD-9 250.00, 250.01).   carvedilol 25 MG tablet Commonly known as: COREG Take 25 mg by mouth 2 (two) times daily with a meal.   Dexcom G6 Transmitter Misc Change every 90 days   Dexcom G7 Receiver Devi USE TO CHECK BLOOD SUGAR DAILY   Dexcom G7 Sensor Misc Change every 10 days   digoxin 0.125 MG tablet Commonly known as: LANOXIN Take 125 mcg by mouth daily.   fenofibrate 48 MG tablet Commonly known as: Tricor Take 1 tablet (48 mg total) by mouth daily.   furosemide 40 MG tablet Commonly known as: LASIX Take 1 tablet (40 mg total) by mouth daily.   HumaLOG KwikPen 100 UNIT/ML KwikPen Generic drug: insulin lispro INJECT 10 UNITS UNDER THE SKIN AT BREAKFAST, 20 TO 21 UNITS WITH LUNCH, AND 10 TO 12 UNITS WITH DINNER   icosapent Ethyl 1 g capsule Commonly known as: Vascepa Take 2 capsules (2 g total) by mouth 2 (two) times daily.   Jardiance 10 MG Tabs tablet Generic drug: empagliflozin Take 10 mg by mouth daily.   lidocaine 5 % Commonly known as: Lidoderm Place 1 patch onto the skin daily. Remove & Discard patch within 12 hours or as directed by MD   Ozempic (1 MG/DOSE) 4 MG/3ML Sopn Generic drug: Semaglutide (1 MG/DOSE) INJECT 1 MG UNDER THE SKIN EVERY WEEK   pantoprazole 40 MG tablet Commonly known as: Protonix Take 1 tablet (40 mg total) by mouth daily.   simvastatin 40 MG tablet Commonly known as: ZOCOR Take 1 tablet (40 mg total) by mouth daily.   Tyler Aas FlexTouch 200 UNIT/ML  FlexTouch Pen Generic drug: insulin degludec ADMINISTER 90 UNITS UNDER THE SKIN EVERY DAY   triamcinolone acetonide 10 MG/ML injection Commonly known as: KENALOG Inject into the skin.        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   Erectile dysfunction    Multifactorial. Improved with Cialis. New onset.   H/O folliculitis    Heartburn    Herpes exposure    History of herpetic exposure   Hyperlipidemia    Hypertension    variable in nature   Indigestion    Mild obesity    Multiple excoriations    multifactorial in origin   Situational stress    secondary to financial issues   Tobacco abuse     Past Surgical History:  Procedure Laterality Date   BALLOON ENTEROSCOPY  12/28/2021   Procedure: BALLOON ENTEROSCOPY;  Surgeon: Lavena Bullion, DO;  Location: [facility];  Service: Gastroenterology;;   ENTEROSCOPY N/A 12/28/2021   Procedure: SINGLE BALLOON ENTEROSCOPY;  Surgeon: Lavena Bullion, DO;  Location: [facility];  Service: Gastroenterology;  Laterality: N/A;   GIVENS CAPSULE STUDY N/A 12/22/2021   Procedure: GIVENS CAPSULE STUDY;  Surgeon: Clarene Essex, MD;  Location: [place];  Service: Gastroenterology;  Laterality: N/A;   HOT HEMOSTASIS N/A 12/28/2021   Procedure: HOT HEMOSTASIS (ARGON PLASMA COAGULATION/BICAP);  Surgeon: Lavena Bullion, DO;  Location: [facility];   Service: Gastroenterology;  Laterality: N/A;   I & D EXTREMITY Left 06/28/2014   Procedure: IRRIGATION AND DEBRIDEMENT EXTREMITY;  Surgeon: Leanora Cover, MD;  Location: [place];  Service: Orthopedics;  Laterality: Left;   INCISION AND DRAINAGE PERIRECTAL ABSCESS N/A 02/17/2014   Procedure: IRRIGATION AND DEBRIDEMENT PERIRECTAL  AND SCROTAL ABSCESS;  Surgeon: Coralie Keens, MD;  Location: [place];  Service: General;  Laterality: N/A;   MINOR IRRIGATION AND DEBRIDEMENT OF WOUND Left 06/20/2015   Procedure: IRRIGATION AND DEBRIDEMENT Wound with nailbed repair;  Surgeon: Leanora Cover, MD;  Location: [place];  Service: Orthopedics;  Laterality: Left;   SUBMUCOSAL TATTOO INJECTION  12/28/2021   Procedure: SUBMUCOSAL TATTOO INJECTION;  Surgeon: Lavena Bullion, DO;  Location: [facility];  Service: Gastroenterology;;    Family History  Problem Relation Age of Onset   Diabetes type II Mother    Hypertension Mother    Diabetes Other     Social History:  reports that he has been smoking cigarettes. He has a 20.00 pack-year smoking history. He has never used smokeless tobacco. He reports that he does not currently use alcohol after a past usage of about 12.0 standard drinks of alcohol per week. He reports that he does not use drugs.   Review of Systems   Lipid history: Treated by PCP with simvastatin, has mixed hyperlipidemia He has been unable to get Vascepa prescription filled likely from mainly insurance issue However doing somewhat better with fenofibrate 48 mg   Lab Results  Component Value Date   CHOL 146 01/25/2022   CHOL 121 11/09/2021   CHOL 96 08/05/2021   Lab Results  Component Value Date   HDL 33.30 (L) 01/25/2022   HDL 23.40 (L) 11/09/2021   HDL 28.50 (L) 08/05/2021   Lab Results  Component Value Date   LDLCALC 33 08/05/2021   LDLCALC 96 09/23/2013   Lab Results  Component Value Date   TRIG 213.0 (H) 01/25/2022   TRIG 373.0 (H) 11/09/2021   TRIG  172.0 (H) 08/05/2021   Lab Results  Component Value Date   CHOLHDL 4 01/25/2022   CHOLHDL 5 11/09/2021   CHOLHDL 3 08/05/2021   Lab Results  Component Value Date   LDLDIRECT 76.0 01/25/2022   LDLDIRECT 39.0 11/09/2021  LDLDIRECT 43.0 05/11/2021            Hypertension: Has been treated with carvedilol   Followed by cardiologist Dr. Terrence Dupont   BP Readings from Last 3 Encounters:  01/28/22 124/72  12/28/21 (!) 156/84  12/23/21 132/74     Right foot swelling: Has Charcot foot followed by orthopedic surgeon  Currently known complications of diabetes: Erectile dysfunction, minimal neuropathy, microalbuminuria  CKD:   followed by nephrologist, creatinine levels as follows, most recent creatinine :  Lab Results  Component Value Date   CREATININE 1.70 (H) 01/25/2022   CREATININE 2.20 (H) 12/23/2021   CREATININE 2.24 (H) 12/22/2021   HYPERKALEMIA: Resolved  Lab Results  Component Value Date   K 4.8 01/25/2022    HYPONATREMIA: His sodium has been previously low but asymptomatic  Not on diuretics like HCTZ, not  on Lasix daily  Urine osmolality on 01/17/2020 was high at 433 but subsequently was 242 in 7/22  Sodium appears improved and and now 135   Lab Results  Component Value Date   NA 135 01/25/2022   K 4.8 01/25/2022   CL 102 01/25/2022   CO2 23 01/25/2022   Lab Results  Component Value Date   TSH 3.81 10/23/2020    LABS:  Lab on 01/25/2022  Component Date Value Ref Range Status   Cholesterol 01/25/2022 146  0 - 200 mg/dL Final   ATP III Classification       Desirable:  < 200 mg/dL               Borderline High:  200 - 239 mg/dL          High:  > = 240 mg/dL   Triglycerides 01/25/2022 213.0 (H)  0.0 - 149.0 mg/dL Final   Normal:  <150 mg/dLBorderline High:  150 - 199 mg/dL   HDL 01/25/2022 33.30 (L)  >39.00 mg/dL Final   VLDL 01/25/2022 42.6 (H)  0.0 - 40.0 mg/dL Final   Total CHOL/HDL Ratio 01/25/2022 4   Final                  Men           Women1/2 Average Risk     3.4          3.3Average Risk          5.0          4.42X Average Risk          9.6          7.13X Average Risk          15.0          11.0                       NonHDL 01/25/2022 113.15   Final   NOTE:  Non-HDL goal should be 30 mg/dL higher than patient's LDL goal (i.e. LDL goal of < 70 mg/dL, would have non-HDL goal of < 100 mg/dL)   Sodium 01/25/2022 135  135 - 145 mEq/L Final   Potassium 01/25/2022 4.8  3.5 - 5.1 mEq/L Final   Chloride 01/25/2022 102  96 - 112 mEq/L Final   CO2 01/25/2022 23  19 - 32 mEq/L Final   Glucose, Bld 01/25/2022 82  70 - 99 mg/dL Final   BUN 01/25/2022 27 (H)  6 - 23 mg/dL Final   Creatinine, Ser 01/25/2022 1.70 (H)  0.40 - 1.50 mg/dL Final   GFR  01/25/2022 45.47 (L)  >60.00 mL/min Final   Calculated using the CKD-EPI Creatinine Equation (2021)   Calcium 01/25/2022 9.2  8.4 - 10.5 mg/dL Final   Fructosamine 01/25/2022 249  0 - 285 umol/L Final   Comment: Published reference interval for apparently healthy subjects between age 58 and 52 is 13 - 285 umol/L and in a poorly controlled diabetic population is 228 - 563 umol/L with a mean of 396 umol/L.    Direct LDL 01/25/2022 76.0  mg/dL Final   Optimal:  <100 mg/dLNear or Above Optimal:  100-129 mg/dLBorderline High:  130-159 mg/dLHigh:  160-189 mg/dLVery High:  >190 mg/dL   Hgb A1c MFr Bld 01/26/2022 7.2 (H)  4.6 - 6.5 % Final   Glycemic Control Guidelines for People with Diabetes:Non Diabetic:  <6%Goal of Therapy: <7%Additional Action Suggested:  >8%     Lab Results  Component Value Date   TSH 3.81 10/23/2020    Physical Examination:  BP 124/72   Pulse 69   Ht _0  (1.803 m)   Wt 276 lb 12.8 oz (125.6 kg)   SpO2 97%   BMI 38.61 kg/m       ASSESSMENT:  Diabetes type 2 on insulin  See history of present illness for discussion of current diabetes management, blood sugar patterns and problems identified  His A1c is back up to 7.2  He is on basal bolus insulin and  Ozempic 1 mg, now Jardiance  With not getting his Ozempic regularly because of supply issues his weight and A1c have gone up However recent blood sugars are averaging 140 and may do better with getting Ozempic consistently and some weight loss Since his overnight blood sugars are not consistently not able to adjust his [place] Aas currently However some of his high readings in the afternoon may be to rebounding from low sugars before lunch from his 10 units of Humalog in the morning  HYPONATREMIA: Sodium back to normal  CKD: To be followed by nephrologist, currently creatinine is improved likely from reducing diuretics  Lipids: Has high triglycerides, and benefiting from fenofibrate, likely can improve further  PLAN:    He will get the Ozempic filled from the local community pharmacy at [facility] Cut back on portions consistently Stop insulin for just drinking coffee in the morning and if blood sugars are higher he can take it the most 5 units Adjust insulin at lunch and dinner based on meal size No change in basal insulin   Continue fenofibrate for high triglycerides    There are no Patient Instructions on file for this visit.     Elayne Snare 01/28/2022, 11:27 AM   Note: This office note was prepared with Dragon voice recognition system technology. Any transcriptional errors that result from this process are unintentional.

## 2022-01-28 NOTE — Patient Instructions (Signed)
No shot for coffee in am

## 2022-01-31 ENCOUNTER — Encounter: Payer: Self-pay | Admitting: Endocrinology

## 2022-02-02 ENCOUNTER — Encounter: Payer: Self-pay | Admitting: Endocrinology

## 2022-02-04 ENCOUNTER — Encounter: Payer: 59 | Admitting: Hematology and Oncology

## 2022-02-04 ENCOUNTER — Inpatient Hospital Stay: Payer: 59

## 2022-02-04 ENCOUNTER — Inpatient Hospital Stay: Payer: 59 | Admitting: Hematology and Oncology

## 2022-02-04 ENCOUNTER — Other Ambulatory Visit: Payer: Self-pay | Admitting: Endocrinology

## 2022-02-04 ENCOUNTER — Telehealth: Payer: Self-pay | Admitting: Hematology and Oncology

## 2022-02-04 DIAGNOSIS — E1165 Type 2 diabetes mellitus with hyperglycemia: Secondary | ICD-10-CM

## 2022-02-04 NOTE — Telephone Encounter (Signed)
Cancelled, no showed appointment per 24 hours policy. Contacted patient to see if he wanted to rescheduled. At this time, he said he would call back. He said he just had a procedure and was still in a lot of pain.

## 2022-02-12 IMAGING — US US RENAL
1 series · 14 of 25 positions shown · non-contrast
Comparison: 08/19/2015

CLINICAL DATA: Chronic kidney disease, stage IV

EXAM:
RENAL / URINARY TRACT ULTRASOUND COMPLETE

[Series 1: us renal · 0.30mm/px · 14 of 39 slices shown]
[im 1/39]
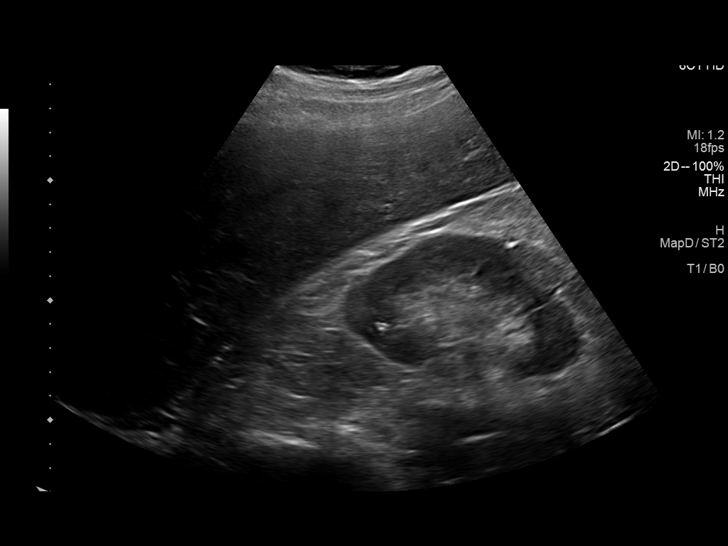
[im 4/39]
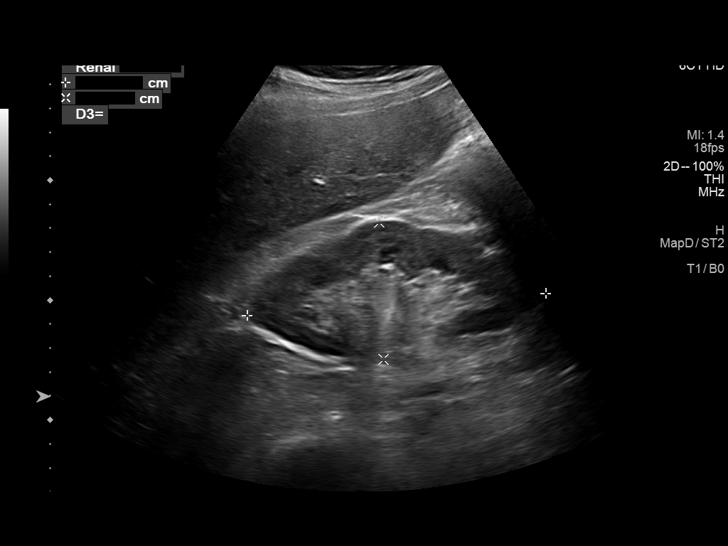
[im 7/39]
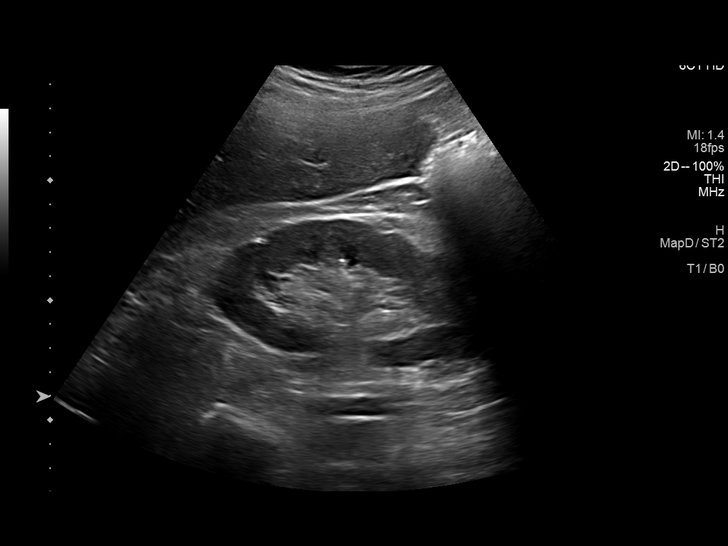
[im 10/39]
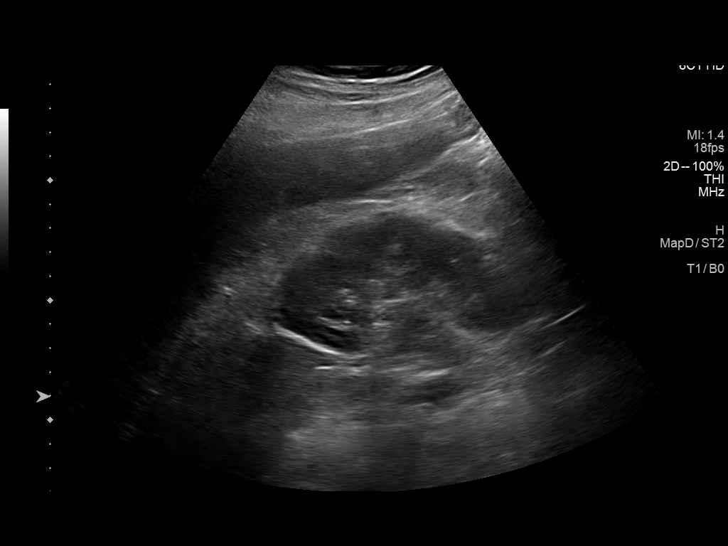
[im 13/39]
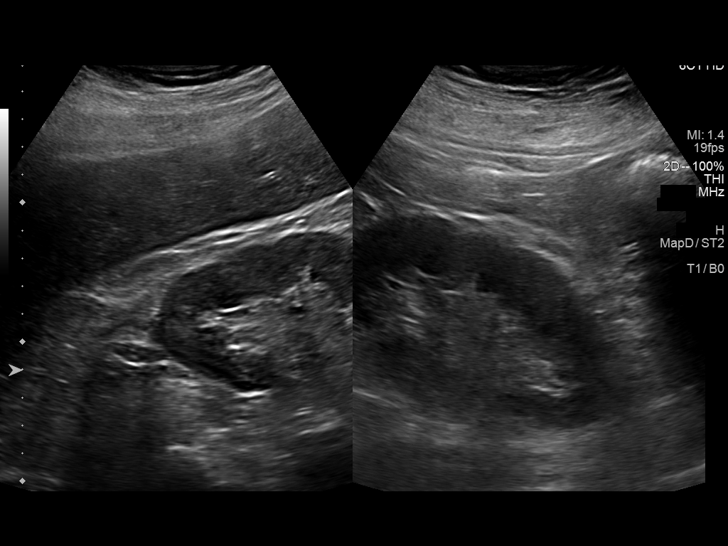
[im 15/39]
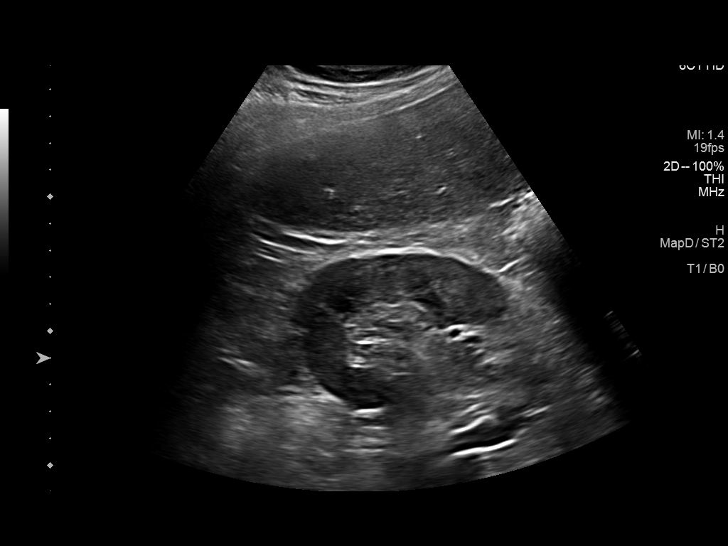
[im 18/39]
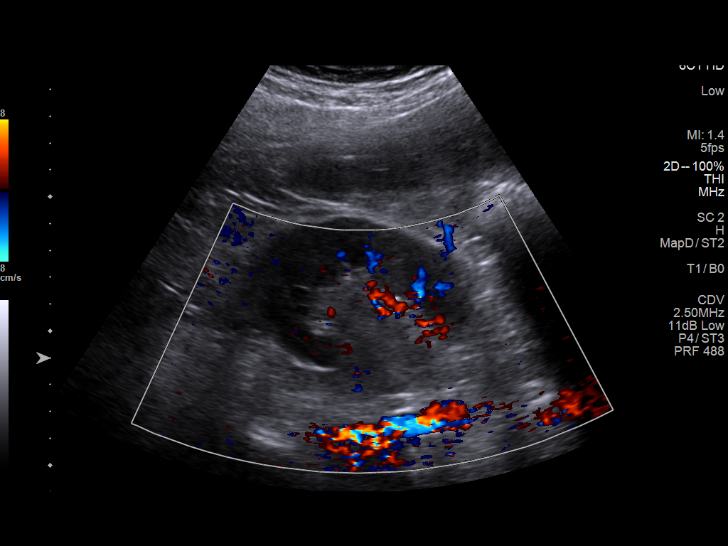
[im 21/39]
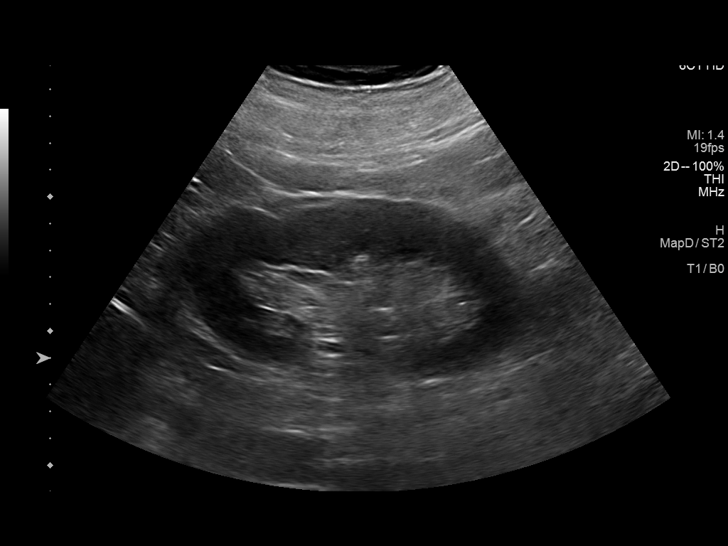
[im 24/39]
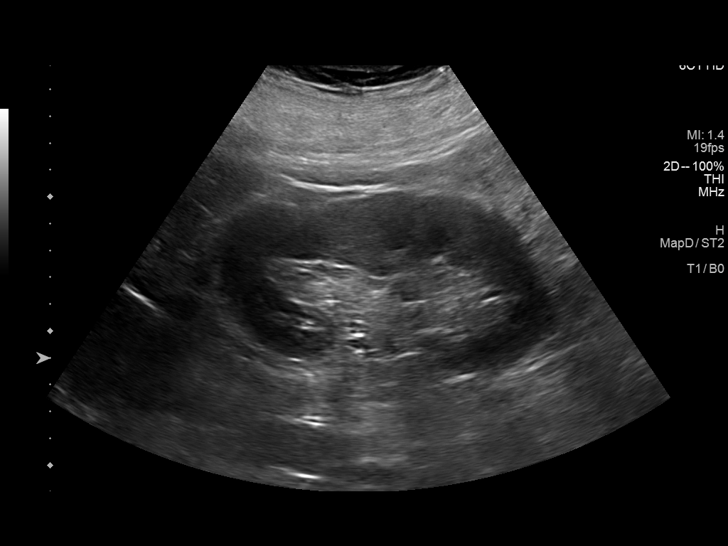
[im 26/39]
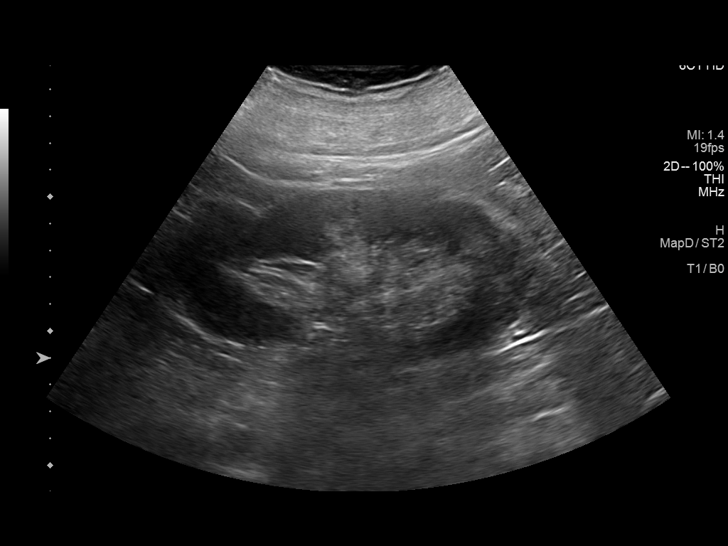
[im 29/39]
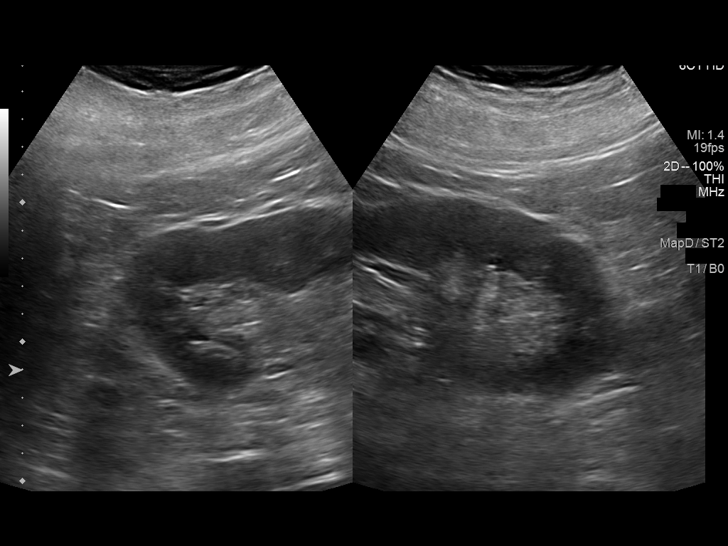
[im 32/39]
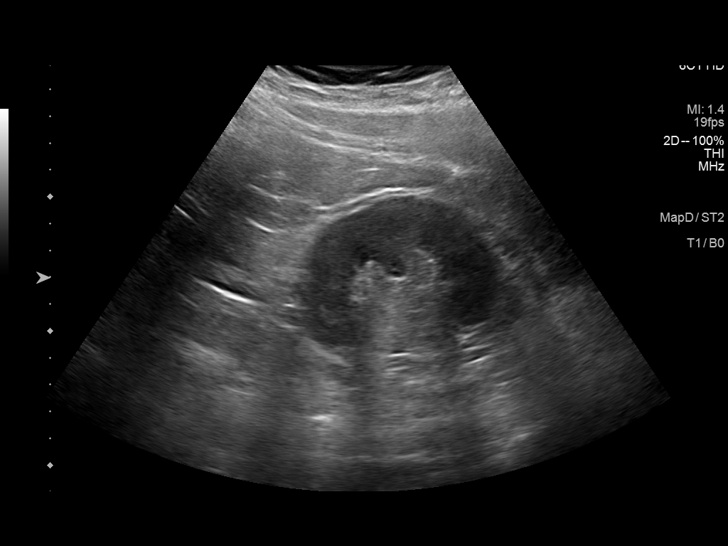
[im 35/39]
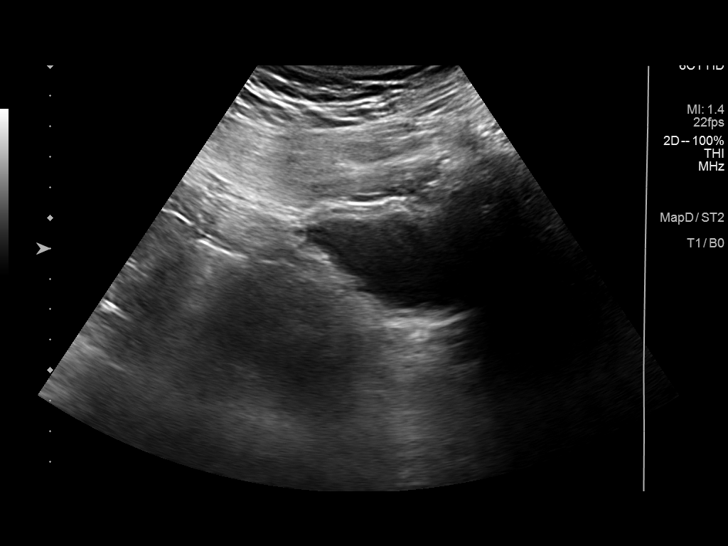
[im 39/39]
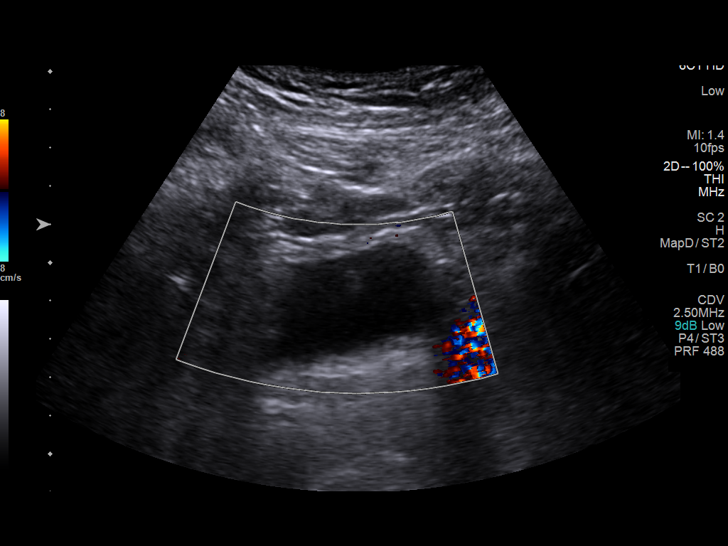

[14 of 25 positions shown; findings below may reference images not displayed]

FINDINGS: Right Kidney:

Renal measurements: 12.5 x 5.7 x 6.3 cm = volume: 238 mL.
Echogenicity within normal limits. No mass or hydronephrosis
visualized.

Left Kidney:

Renal measurements: 13.2 x 6.2 x 5.9 cm = volume: 254 mL.
Echogenicity within normal limits. No mass or hydronephrosis
visualized.

Bladder:

Appears normal for degree of bladder distention.

Other:

None.
IMPRESSION: Normal renal ultrasound.

## 2022-03-11 ENCOUNTER — Encounter: Payer: 59 | Admitting: Hematology and Oncology

## 2022-03-11 ENCOUNTER — Other Ambulatory Visit: Payer: 59

## 2022-03-18 ENCOUNTER — Other Ambulatory Visit: Payer: Self-pay

## 2022-03-18 ENCOUNTER — Encounter: Payer: 59 | Admitting: Hematology and Oncology

## 2022-03-18 NOTE — Progress Notes (Signed)
[place] [phone]   Fax:([phone] [place] NOTE  Patient Care Team: Rogers Blocker, MD as PCP - General (Internal Medicine)  Hematological/Oncological History 12/21/2021-12/23/2021: Admitted due to acute iron deficiency anemia with Hgb 6.7 and hemoccult test positive. He was given 3 units of PRBC and IV ferrlecit 250 mg x 2 doses. CT scan did show mildly nodular hepatic contour concerning for cirrhosis. Capsular endoscopy showed portal gastropathy and two small bowel bleeding sites. Hgb improved to 8.0 at discharge. 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  03/19/2022: Establish care with [facility]  CHIEF COMPLAINTS/PURPOSE OF CONSULTATION:  Iron deficiency anemia  HISTORY OF PRESENTING ILLNESS:  Austin Alexander 51 y.o. male with medical history significant for alcohol abuse, CHF, COPD, T2DM, hyperlipidemia and hypertension presents to the hematology clinic for initial evaluation of iron deficiency anemia. He is unaccompanied for this visit.   On exam today, Mr. Buhl reports improvement of his energy levels since his hospitalization in September 2023. He is compliant with taking iron pills once a day as prescribed. He denies any dietary changes or appetite loss. He denies nausea, vomiting or abdominal pain. His bowel habits are unchanged without any recurrent episodes of diarrhea or constipation. He reports dark stools since starting iron pills. He denies any signs of bleeding or bruising. He denies fevers, chills, night sweats, shortness of breath, chest pain or cough. He has no other complaints. Rest of the 10 point ROS is below.   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   Erectile dysfunction    Multifactorial. Improved with Cialis. New onset.   H/O folliculitis    Heartburn    Herpes exposure    History of herpetic exposure   Hyperlipidemia    Hypertension    variable in nature   Indigestion    Mild obesity    Multiple excoriations    multifactorial in origin   Situational stress    secondary to financial issues   Tobacco abuse     SURGICAL HISTORY: Past Surgical History:  Procedure Laterality Date   BALLOON ENTEROSCOPY  12/28/2021   Procedure: BALLOON ENTEROSCOPY;  Surgeon: Lavena Bullion, DO;  Location: [facility];  Service: Gastroenterology;;   ENTEROSCOPY N/A 12/28/2021   Procedure: SINGLE BALLOON ENTEROSCOPY;  Surgeon: Lavena Bullion, DO;  Location: [facility];  Service: Gastroenterology;  Laterality: N/A;   GIVENS CAPSULE STUDY N/A 12/22/2021   Procedure: GIVENS CAPSULE STUDY;  Surgeon: Clarene Essex, MD;  Location: [facility];  Service: Gastroenterology;  Laterality: N/A;   HOT HEMOSTASIS N/A 12/28/2021   Procedure: HOT HEMOSTASIS (ARGON PLASMA COAGULATION/BICAP);  Surgeon: Lavena Bullion, DO;  Location: [facility];  Service: Gastroenterology;  Laterality: N/A;   I & D EXTREMITY  Left 06/28/2014   Procedure: IRRIGATION AND DEBRIDEMENT EXTREMITY;  Surgeon: Leanora Cover, MD;  Location: [facility];  Service: Orthopedics;  Laterality: Left;   INCISION AND DRAINAGE PERIRECTAL ABSCESS N/A 02/17/2014   Procedure: IRRIGATION AND DEBRIDEMENT PERIRECTAL  AND SCROTAL ABSCESS;  Surgeon: Coralie Keens, MD;  Location: [facility];  Service: General;  Laterality: N/A;   MINOR  IRRIGATION AND DEBRIDEMENT OF WOUND Left 06/20/2015   Procedure: IRRIGATION AND DEBRIDEMENT Wound with nailbed repair;  Surgeon: Leanora Cover, MD;  Location: [facility];  Service: Orthopedics;  Laterality: Left;   SUBMUCOSAL TATTOO INJECTION  12/28/2021   Procedure: SUBMUCOSAL TATTOO INJECTION;  Surgeon: Lavena Bullion, DO;  Location: [facility];  Service: Gastroenterology;;    SOCIAL HISTORY: Social History   Socioeconomic History   Marital status: Single    Spouse name: Not on file   Number of children: Not on file   Years of education: Not on file   Highest education level: Not on file  Occupational History   Not on file  Tobacco Use   Smoking status: Every Day    Packs/day: 1.00    Years: 20.00    Total pack years: 20.00    Types: Cigarettes   Smokeless tobacco: Never  Vaping Use   Vaping Use: Never used  Substance and Sexual Activity   Alcohol use: Not Currently    Alcohol/week: 12.0 standard drinks of alcohol    Types: 10 Cans of beer, 2 Shots of liquor per week    Comment: As of 04/06/14 - states occasional use   Drug use: No   Sexual activity: Yes  Other Topics Concern   Not on file  Social History Narrative   Not on file   Social Determinants of Health   Financial Resource Strain: Not on file  Food Insecurity: No Food Insecurity (12/22/2021)   Hunger Vital Sign    Worried About Running Out of Food in the Last Year: Never true    Ran Out of Food in the Last Year: Never true  Transportation Needs: No Transportation Needs (12/22/2021)   PRAPARE - Hydrologist (Medical): No    Lack of Transportation (Non-Medical): No  Physical Activity: Not on file  Stress: Not on file  Social Connections: Not on file  Intimate Partner Violence: Not At Risk (12/22/2021)   Humiliation, Afraid, Rape, and Kick questionnaire    Fear of Current or Ex-Partner: No    Emotionally Abused: No    Physically Abused: No    Sexually Abused: No     FAMILY HISTORY: Family History  Problem Relation Age of Onset   Diabetes type II Mother    Hypertension Mother    Iron deficiency Mother    Diabetes Other     ALLERGIES:  has No Known Allergies.  MEDICATIONS:  Current Outpatient Medications  Medication Sig Dispense Refill   B-D ULTRAFINE III SHORT PEN 31G X 8 MM MISC SMARTSIG:Pre-Filled Pen Syringe SUB-Q Daily     blood glucose meter kit and supplies Dispense based on patient and insurance preference. Use up to four times daily as directed. (FOR ICD-9 250.00, 250.01). 1 each 0   carvedilol (COREG) 25 MG tablet Take 25 mg by mouth 2 (two) times daily with a meal.     Continuous Blood Gluc Receiver (DEXCOM G7 RECEIVER) DEVI USE TO CHECK BLOOD SUGAR DAILY 1 each 0   Continuous Blood Gluc Sensor (DEXCOM G7 SENSOR) MISC Change every 10 days 3  each 3   Continuous Blood Gluc Transmit (DEXCOM G6 TRANSMITTER) MISC Change every 90 days 3 each 1   digoxin (LANOXIN) 0.125 MG tablet Take 125 mcg by mouth daily.     fenofibrate (TRICOR) 48 MG tablet Take 1 tablet (48 mg total) by mouth daily. 90 tablet 1   HUMALOG KWIKPEN 100 UNIT/ML KwikPen INJECT 10 UNITS UNDER THE SKIN AT BREAKFAST, 20 TO 21 UNITS WITH LUNCH, AND 10 TO 12 UNITS WITH DINNER 15 mL 2   irbesartan (AVAPRO) 75 MG tablet Take 75 mg by mouth daily.     JARDIANCE 10 MG TABS tablet Take 10 mg by mouth daily.     pantoprazole (PROTONIX) 40 MG tablet Take 1 tablet (40 mg total) by mouth daily. 30 tablet 1   Semaglutide, 1 MG/DOSE, (OZEMPIC, 1 MG/DOSE,) 4 MG/3ML SOPN Inject 1 mg into the skin once a week. 9 mL 1   simvastatin (ZOCOR) 40 MG tablet Take 1 tablet (40 mg total) by mouth daily. 30 tablet 0   TRESIBA FLEXTOUCH 200 UNIT/ML FlexTouch Pen ADMINISTER 90 UNITS UNDER THE SKIN EVERY DAY 15 mL 2   triamcinolone acetonide (KENALOG) 10 MG/ML injection Inject into the skin.     allopurinol (ZYLOPRIM) 100 MG tablet Take 1 tablet (100 mg total) by mouth daily. (Patient not taking:  Reported on 12/21/2021) 30 tablet 0   Blood Glucose Monitoring Suppl (ACCU-CHEK GUIDE ME) w/Device KIT by Does not apply route. (Patient not taking: Reported on 03/19/2022)     furosemide (LASIX) 40 MG tablet Take 1 tablet (40 mg total) by mouth daily. (Patient not taking: Reported on 03/19/2022) 30 tablet 0   glucose blood (ACCU-CHEK GUIDE) test strip Use as instructed to check blood sugar 2 times daily. (Patient not taking: Reported on 05/13/2021) 100 each 3   icosapent Ethyl (VASCEPA) 1 g capsule Take 2 capsules (2 g total) by mouth 2 (two) times daily. (Patient not taking: Reported on 12/21/2021) 360 capsule 2   lidocaine (LIDODERM) 5 % Place 1 patch onto the skin daily. Remove & Discard patch within 12 hours or as directed by MD (Patient not taking: Reported on 12/21/2021) 30 patch 0   No current facility-administered medications for this visit.    REVIEW OF SYSTEMS:   Constitutional: ( - ) fevers, ( - )  chills , ( - ) night sweats Eyes: ( - ) blurriness of vision, ( - ) double vision, ( - ) watery eyes Ears, nose, mouth, throat, and face: ( - ) mucositis, ( - ) sore throat Respiratory: ( - ) cough, ( - ) dyspnea, ( - ) wheezes Cardiovascular: ( - ) palpitation, ( - ) chest discomfort, ( - ) lower extremity swelling Gastrointestinal:  ( - ) nausea, ( - ) heartburn, ( - ) change in bowel habits Skin: ( - ) abnormal skin rashes Lymphatics: ( - ) new lymphadenopathy, ( - ) easy bruising Neurological: ( - ) numbness, ( - ) tingling, ( - ) new weaknesses Behavioral/Psych: ( - ) mood change, ( - ) new changes  All other systems were reviewed with the patient and are negative.  PHYSICAL EXAMINATION: ECOG PERFORMANCE STATUS: 1 - Symptomatic but completely ambulatory  Vitals:   03/19/22 0942  BP: 136/77  Pulse: 78  Resp: (!) 178  Temp: 97.7 F (36.5 C)  SpO2: 100%   Filed Weights   03/19/22 0942  Weight: 274 lb 1 oz (124.3 kg)    GENERAL: well appearing male in  NAD  SKIN: skin color,  texture, turgor are normal, no rashes or significant lesions EYES: conjunctiva are pink and non-injected, sclera clear OROPHARYNX: no exudate, no erythema; lips, buccal mucosa, and tongue normal  NECK: supple, non-tender LYMPH:  no palpable lymphadenopathy in the cervical or supraclavicular lymph nodes.  LUNGS: clear to auscultation and percussion with normal breathing effort HEART: regular rate & rhythm and no murmurs and no lower extremity edema ABDOMEN: soft, non-tender, non-distended, normal bowel sounds Musculoskeletal: no cyanosis of digits and no clubbing  PSYCH: alert & oriented x 3, fluent speech NEURO: no focal motor/sensory deficits  LABORATORY DATA:  I have reviewed the data as listed    Latest Ref Rng & Units 03/19/2022   10:04 AM 12/28/2021   12:58 PM 12/23/2021    2:42 AM  CBC  WBC 4.0 - 10.5 K/uL 7.9  11.8  8.6   Hemoglobin 13.0 - 17.0 g/dL 10.2  9.4  8.0   Hematocrit 39.0 - 52.0 % 33.0  32.7  26.2   Platelets 150 - 400 K/uL 522  455  334        Latest Ref Rng & Units 03/19/2022   10:04 AM 01/25/2022   11:35 AM 12/23/2021    2:42 AM  CMP  Glucose 70 - 99 mg/dL 128  82  84   BUN 6 - 20 mg/dL 33  27  23   Creatinine 0.61 - 1.24 mg/dL 2.21  1.70  2.20   Sodium 135 - 145 mmol/L 133  135  137   Potassium 3.5 - 5.1 mmol/L 5.4  4.8  4.9   Chloride 98 - 111 mmol/L 104  102  109   CO2 22 - 32 mmol/L _0 Calcium 8.9 - 10.3 mg/dL 9.1  9.2  8.6   Total Protein 6.5 - 8.1 g/dL 8.4     Total Bilirubin 0.3 - 1.2 mg/dL 0.3     Alkaline Phos 38 - 126 U/L 128     AST 15 - 41 U/L 9     ALT 0 - 44 U/L 10       ASSESSMENT & PLAN Nickalaus Crooke is a 51 y.o. male who presents to the hematology clinic for evaluation of iron deficiency anemia secondary to GI bleeding. Patient is established with gastroenterologist, Dr. Alessandra Bevels. He is tolerating PO iron without any side effects. We discussed repeating labs today in order to determine if patient needs additional IV iron to  bolster levels. Patient is in agreement with the plan   #Iron deficiency anemia 2/2 GI bleed: --Underwent capsular endoscopy on 12/22/2021 that showed probable portal gastropathy and two small bowel bleeding sites. 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  --Patient will follow up with Dr. Alessandra Bevels in outpatient setting  --Currently on ferrous sulfate 325 mg once daily. Recommend to continue once a day with a source of vitamin C --Provided list of iron rich foods to incorporate into diet --Labs today to check CBC, CMP, retic panel, ferritin, iron and TIBC --Consider IV iron infusions to help bolster levels based on today's labs --RTC in 3 months with repeat labs.    Orders Placed This Encounter  Procedures   CBC with Differential ([facility] Only)    Standing Status:   Future    Number of Occurrences:   1    Standing Expiration Date:   03/19/2023   CMP ([facility] only)  Standing Status:   Future    Number of Occurrences:   1    Standing Expiration Date:   03/19/2023   Ferritin    Standing Status:   Future    Number of Occurrences:   1    Standing Expiration Date:   03/19/2023   Iron and Iron Binding Capacity ([facility] only)    Standing Status:   Future    Number of Occurrences:   1    Standing Expiration Date:   03/19/2023   Retic Panel    Standing Status:   Future    Number of Occurrences:   1    Standing Expiration Date:   03/19/2023   Sample to Blood Bank    Standing Status:   Future    Number of Occurrences:   1    Standing Expiration Date:   03/19/2023    All questions were answered. The patient knows to call the clinic with any problems, questions or concerns.  I have spent a total of 60 minutes minutes of face-to-face and non-face-to-face time, preparing to see the patient, obtaining and/or reviewing separately obtained history, performing a medically appropriate examination, counseling and educating  the patient, ordering medications/tests/procedures, referring and communicating with other health care professionals, documenting clinical information in the electronic health record,  and care coordination.   Dede Query, PA-C Department of Hematology/Oncology [place] [facility] Phone: [phone]

## 2022-03-19 ENCOUNTER — Encounter: Payer: Self-pay | Admitting: Physician Assistant

## 2022-03-19 ENCOUNTER — Inpatient Hospital Stay: Payer: Commercial Managed Care - HMO | Attending: Hematology and Oncology | Admitting: Physician Assistant

## 2022-03-19 ENCOUNTER — Inpatient Hospital Stay: Payer: Commercial Managed Care - HMO

## 2022-03-19 VITALS — BP 136/77 | HR 78 | Temp 97.7°F | Resp 178 | Wt 274.1 lb

## 2022-03-19 DIAGNOSIS — K922 Gastrointestinal hemorrhage, unspecified: Secondary | ICD-10-CM

## 2022-03-19 DIAGNOSIS — I509 Heart failure, unspecified: Secondary | ICD-10-CM | POA: Diagnosis not present

## 2022-03-19 DIAGNOSIS — F17210 Nicotine dependence, cigarettes, uncomplicated: Secondary | ICD-10-CM

## 2022-03-19 DIAGNOSIS — D500 Iron deficiency anemia secondary to blood loss (chronic): Secondary | ICD-10-CM

## 2022-03-19 DIAGNOSIS — I110 Hypertensive heart disease with heart failure: Secondary | ICD-10-CM | POA: Diagnosis not present

## 2022-03-19 DIAGNOSIS — E119 Type 2 diabetes mellitus without complications: Secondary | ICD-10-CM

## 2022-03-19 LAB — CMP (CANCER CENTER ONLY)
ALT: 10 U/L (ref 0–44)
AST: 9 U/L — ABNORMAL LOW (ref 15–41)
Albumin: 3.6 g/dL (ref 3.5–5.0)
Alkaline Phosphatase: 128 U/L — ABNORMAL HIGH (ref 38–126)
Anion gap: 4 — ABNORMAL LOW (ref 5–15)
BUN: 33 mg/dL — ABNORMAL HIGH (ref 6–20)
CO2: 25 mmol/L (ref 22–32)
Calcium: 9.1 mg/dL (ref 8.9–10.3)
Chloride: 104 mmol/L (ref 98–111)
Creatinine: 2.21 mg/dL — ABNORMAL HIGH (ref 0.61–1.24)
GFR, Estimated: 35 mL/min — ABNORMAL LOW (ref >60)
Glucose, Bld: 128 mg/dL — ABNORMAL HIGH (ref 70–99)
Potassium: 5.4 mmol/L — ABNORMAL HIGH (ref 3.5–5.1)
Sodium: 133 mmol/L — ABNORMAL LOW (ref 135–145)
Total Bilirubin: 0.3 mg/dL (ref 0.3–1.2)
Total Protein: 8.4 g/dL — ABNORMAL HIGH (ref 6.5–8.1)

## 2022-03-19 LAB — CBC WITH DIFFERENTIAL (CANCER CENTER ONLY)
Abs Immature Granulocytes: 0.02 10*3/uL (ref 0.00–0.07)
Basophils Absolute: 0 10*3/uL (ref 0.0–0.1)
Basophils Relative: 0 %
Eosinophils Absolute: 0.5 10*3/uL (ref 0.0–0.5)
Eosinophils Relative: 7 %
HCT: 33 % — ABNORMAL LOW (ref 39.0–52.0)
Hemoglobin: 10.2 g/dL — ABNORMAL LOW (ref 13.0–17.0)
Immature Granulocytes: 0 %
Lymphocytes Relative: 13 %
Lymphs Abs: 1 10*3/uL (ref 0.7–4.0)
MCH: 23.5 pg — ABNORMAL LOW (ref 26.0–34.0)
MCHC: 30.9 g/dL (ref 30.0–36.0)
MCV: 76 fL — ABNORMAL LOW (ref 80.0–100.0)
Monocytes Absolute: 0.4 10*3/uL (ref 0.1–1.0)
Monocytes Relative: 5 %
Neutro Abs: 5.9 10*3/uL (ref 1.7–7.7)
Neutrophils Relative %: 75 %
Platelet Count: 522 10*3/uL — ABNORMAL HIGH (ref 150–400)
RBC: 4.34 MIL/uL (ref 4.22–5.81)
RDW: 20.7 % — ABNORMAL HIGH (ref 11.5–15.5)
WBC Count: 7.9 10*3/uL (ref 4.0–10.5)
nRBC: 0 % (ref 0.0–0.2)

## 2022-03-19 LAB — RETIC PANEL
Immature Retic Fract: 25.4 % — ABNORMAL HIGH (ref 2.3–15.9)
RBC.: 4.31 MIL/uL (ref 4.22–5.81)
Retic Count, Absolute: 69.4 10*3/uL (ref 19.0–186.0)
Retic Ct Pct: 1.6 % (ref 0.4–3.1)
Reticulocyte Hemoglobin: 26.5 pg — ABNORMAL LOW (ref >27.9)

## 2022-03-19 LAB — IRON AND IRON BINDING CAPACITY (CC-WL,HP ONLY)
Iron: 20 ug/dL — ABNORMAL LOW (ref 45–182)
Saturation Ratios: 7 % — ABNORMAL LOW (ref 17.9–39.5)
TIBC: 291 ug/dL (ref 250–450)
UIBC: 271 ug/dL (ref 117–376)

## 2022-03-19 LAB — SAMPLE TO BLOOD BANK

## 2022-03-19 LAB — FERRITIN: Ferritin: 18 ng/mL — ABNORMAL LOW (ref 24–336)

## 2022-03-21 ENCOUNTER — Encounter: Payer: Self-pay | Admitting: Physician Assistant

## 2022-03-21 DIAGNOSIS — D500 Iron deficiency anemia secondary to blood loss (chronic): Secondary | ICD-10-CM | POA: Insufficient documentation

## 2022-03-22 ENCOUNTER — Telehealth: Payer: Self-pay | Admitting: Physician Assistant

## 2022-03-22 ENCOUNTER — Telehealth: Payer: Self-pay | Admitting: Pharmacy Technician

## 2022-03-22 ENCOUNTER — Telehealth: Payer: Self-pay

## 2022-03-22 NOTE — Telephone Encounter (Signed)
Pt advised of lab results and the need for IV iron.  Pt aware scheduling will call to set up the appt after approved by his insurance.

## 2022-03-22 NOTE — Telephone Encounter (Signed)
Dr. Charlies Silvers,  Monoferric is non preferred medication with Christella Scheuermann and will be denied if patient has not failed and or tried Venofer. Venofer is the preferred medication. Would you like to try venofer?

## 2022-03-22 NOTE — Telephone Encounter (Signed)
-----   Message from Lincoln Brigham, PA-C sent at 03/21/2022  2:04 PM EST ----- Please notify patient that although improved, he still has iron deficiency anemia. Recommend Iv iron to help improve levels. Please continue to take iron pills once a day. We will see him back in 3 months to repeat labs.

## 2022-03-22 NOTE — Telephone Encounter (Signed)
Called patient to notify of new upcoming appointment. Left voicemail with appointment information.  

## 2022-03-23 ENCOUNTER — Ambulatory Visit: Payer: Commercial Managed Care - HMO

## 2022-03-23 ENCOUNTER — Other Ambulatory Visit: Payer: Self-pay

## 2022-03-30 ENCOUNTER — Ambulatory Visit (INDEPENDENT_AMBULATORY_CARE_PROVIDER_SITE_OTHER): Payer: Commercial Managed Care - HMO | Admitting: *Deleted

## 2022-03-30 VITALS — BP 126/76 | HR 75 | Temp 98.3°F | Resp 18 | Ht 71.0 in | Wt 274.8 lb

## 2022-03-30 DIAGNOSIS — K922 Gastrointestinal hemorrhage, unspecified: Secondary | ICD-10-CM | POA: Diagnosis not present

## 2022-03-30 DIAGNOSIS — D500 Iron deficiency anemia secondary to blood loss (chronic): Secondary | ICD-10-CM | POA: Diagnosis not present

## 2022-03-30 MED ORDER — SODIUM CHLORIDE 0.9 % IV SOLN
200.0000 mg | Freq: Once | INTRAVENOUS | Status: AC
  Administered 2022-03-30: 200 mg via INTRAVENOUS
  Filled 2022-03-30: qty 10

## 2022-03-30 MED ORDER — ACETAMINOPHEN 325 MG PO TABS
650.0000 mg | ORAL_TABLET | Freq: Once | ORAL | Status: AC
  Administered 2022-03-30: 650 mg via ORAL
  Filled 2022-03-30: qty 2

## 2022-03-30 MED ORDER — DIPHENHYDRAMINE HCL 25 MG PO CAPS
25.0000 mg | ORAL_CAPSULE | Freq: Once | ORAL | Status: AC
  Administered 2022-03-30: 25 mg via ORAL
  Filled 2022-03-30: qty 1

## 2022-03-30 NOTE — Progress Notes (Signed)
Diagnosis: Iron Deficiency Anemia  Provider:  Marshell Garfinkel MD  Procedure: Infusion  IV Type: Peripheral, IV Location: L Forearm  Venofer (Iron Sucrose), Dose: 200 mg  Infusion Start Time: 1024 am  Infusion Stop Time: 4497 am  Post Infusion IV Care: Observation period completed and Peripheral IV Discontinued  Discharge: Condition: Good, Destination: Home . AVS provided to patient.   Performed by:  Oren Beckmann, RN

## 2022-04-01 ENCOUNTER — Telehealth: Payer: Self-pay

## 2022-04-01 ENCOUNTER — Encounter: Payer: Self-pay | Admitting: Physician Assistant

## 2022-04-01 ENCOUNTER — Other Ambulatory Visit (HOSPITAL_COMMUNITY): Payer: Self-pay

## 2022-04-01 NOTE — Telephone Encounter (Signed)
Pharmacy Patient Advocate Encounter  Prior Authorization for TRESIBA 200 UNIT/ML PEN has been approved.    Effective dates: 04/01/22 through 04/01/23  Spoke with Pharmacy to process.  Received notification from [place] that prior authorization for TRESIBA 200 UNIT/ML PEN is needed.    PA submitted on 04/01/22 [place], [place] Patient Advocate Specialist Direct Number: [phone] Fax: [phone]

## 2022-04-01 NOTE — Telephone Encounter (Signed)
Patient states that he has new insurance and they do not cover ozempic on [employer]. Nor do they cover humalog. Please advise.

## 2022-04-02 NOTE — Telephone Encounter (Signed)
Patient called about tresiba states it is still $700 giving sample until we can find out why.

## 2022-04-06 ENCOUNTER — Telehealth: Payer: Self-pay

## 2022-04-06 ENCOUNTER — Encounter: Payer: Self-pay | Admitting: Physician Assistant

## 2022-04-06 ENCOUNTER — Ambulatory Visit (INDEPENDENT_AMBULATORY_CARE_PROVIDER_SITE_OTHER): Payer: Commercial Managed Care - HMO

## 2022-04-06 ENCOUNTER — Other Ambulatory Visit (HOSPITAL_COMMUNITY): Payer: Self-pay

## 2022-04-06 VITALS — BP 124/77 | HR 76 | Temp 98.5°F | Resp 18 | Wt 276.8 lb

## 2022-04-06 DIAGNOSIS — K922 Gastrointestinal hemorrhage, unspecified: Secondary | ICD-10-CM | POA: Diagnosis not present

## 2022-04-06 DIAGNOSIS — D500 Iron deficiency anemia secondary to blood loss (chronic): Secondary | ICD-10-CM

## 2022-04-06 MED ORDER — ACETAMINOPHEN 325 MG PO TABS: 650.0000 mg | ORAL_TABLET | Freq: Once | ORAL | Status: DC

## 2022-04-06 MED ORDER — SODIUM CHLORIDE 0.9 % IV SOLN
200.0000 mg | Freq: Once | INTRAVENOUS | Status: AC
  Administered 2022-04-06: 200 mg via INTRAVENOUS
  Filled 2022-04-06: qty 10

## 2022-04-06 MED ORDER — DIPHENHYDRAMINE HCL 25 MG PO CAPS: 25.0000 mg | ORAL_CAPSULE | Freq: Once | ORAL | Status: DC

## 2022-04-06 NOTE — Telephone Encounter (Signed)
Pharmacy Patient Advocate Encounter   Received notification from [place] that prior authorization for [facility] 4MG /3ML PENS is needed.    PA submitted on 04/06/22 Key [id number] Status is pending  Karie Soda, [place] Patient Advocate Specialist Direct Number: [phone] Fax: [phone]

## 2022-04-06 NOTE — Telephone Encounter (Signed)
PA is needed for Ozempic and pending determination. Humalog is covered on [employer] and PA should not be needed.

## 2022-04-06 NOTE — Telephone Encounter (Signed)
Pharmacy Patient Advocate Encounter  Prior Authorization for [facility] 4MG /3ML PENS has been approved.    PA# [id number] Effective dates: 04/06/22 through 04/06/23  However, patient has a very high cost. Looks like they have a high deductible plan.     PLEASE ADVISE  Karie Soda, CPhT Pharmacy Patient Advocate Specialist Direct Number: [phone] Fax: [phone]

## 2022-04-06 NOTE — Progress Notes (Signed)
Diagnosis: Iron Deficiency Anemia  Provider:  Marshell Garfinkel MD  Procedure: Infusion  IV Type: Peripheral, IV Location: L Hand  Venofer (Iron Sucrose), Dose: 200 mg  Infusion Start Time: 1040  Infusion Stop Time: 4591  Post Infusion IV Care: Peripheral IV Discontinued  Discharge: Condition: Good, Destination: Home . AVS provided to patient.   Performed by:  Adelina Mings, LPN

## 2022-04-06 NOTE — Telephone Encounter (Signed)
Submitting PA for both on new insurance. See chart notes for updates.

## 2022-04-09 NOTE — Telephone Encounter (Signed)
I called patient and he will stop by today to complete application for ozempic and tresiba.

## 2022-04-13 ENCOUNTER — Ambulatory Visit (INDEPENDENT_AMBULATORY_CARE_PROVIDER_SITE_OTHER): Payer: Commercial Managed Care - HMO

## 2022-04-13 VITALS — BP 129/84 | HR 78 | Temp 98.2°F | Resp 20 | Ht 71.0 in | Wt 281.2 lb

## 2022-04-13 DIAGNOSIS — D500 Iron deficiency anemia secondary to blood loss (chronic): Secondary | ICD-10-CM | POA: Diagnosis not present

## 2022-04-13 MED ORDER — DIPHENHYDRAMINE HCL 25 MG PO CAPS: 25.0000 mg | ORAL_CAPSULE | Freq: Once | ORAL | Status: DC

## 2022-04-13 MED ORDER — SODIUM CHLORIDE 0.9 % IV SOLN
200.0000 mg | Freq: Once | INTRAVENOUS | Status: AC
  Administered 2022-04-13: 200 mg via INTRAVENOUS
  Filled 2022-04-13: qty 10

## 2022-04-13 MED ORDER — ACETAMINOPHEN 325 MG PO TABS: 650.0000 mg | ORAL_TABLET | Freq: Once | ORAL | Status: DC

## 2022-04-13 NOTE — Progress Notes (Signed)
Diagnosis: Iron Deficiency Anemia  Provider:  Marshell Garfinkel MD  Procedure: Infusion  IV Type: Peripheral, IV Location: R Forearm  Venofer (Iron Sucrose), Dose: 200 mg  Infusion Start Time: 4462  Infusion Stop Time: 8638  Post Infusion IV Care: Peripheral IV Discontinued  Discharge: Condition: Good, Destination: Home . AVS provided to patient.   Performed by:  Cleophus Molt, RN

## 2022-04-19 ENCOUNTER — Ambulatory Visit (INDEPENDENT_AMBULATORY_CARE_PROVIDER_SITE_OTHER): Payer: Commercial Managed Care - HMO

## 2022-04-19 VITALS — BP 122/75 | HR 66 | Temp 98.1°F | Resp 18 | Wt 280.6 lb

## 2022-04-19 DIAGNOSIS — D500 Iron deficiency anemia secondary to blood loss (chronic): Secondary | ICD-10-CM

## 2022-04-19 MED ORDER — ACETAMINOPHEN 325 MG PO TABS: 650.0000 mg | ORAL_TABLET | Freq: Once | ORAL | Status: DC

## 2022-04-19 MED ORDER — SODIUM CHLORIDE 0.9 % IV SOLN
200.0000 mg | Freq: Once | INTRAVENOUS | Status: AC
  Administered 2022-04-19: 200 mg via INTRAVENOUS
  Filled 2022-04-19: qty 10

## 2022-04-19 MED ORDER — DIPHENHYDRAMINE HCL 25 MG PO CAPS: 25.0000 mg | ORAL_CAPSULE | Freq: Once | ORAL | Status: DC

## 2022-04-19 NOTE — Progress Notes (Signed)
Diagnosis: Iron Deficiency Anemia  Provider:  Marshell Garfinkel MD  Procedure: Infusion  IV Type: Peripheral, IV Location: L Forearm  Venofer (Iron Sucrose), Dose: 200 mg  Infusion Start Time: 4332  Infusion Stop Time: 9518  Post Infusion IV Care: Peripheral IV Discontinued  Discharge: Condition: Good, Destination: Home . AVS provided to patient.   Performed by:  Adelina Mings, LPN

## 2022-04-20 ENCOUNTER — Ambulatory Visit: Payer: Commercial Managed Care - HMO

## 2022-04-20 NOTE — Telephone Encounter (Signed)
I have not heard from patient assistance as of yet. Patient wants to know if we can give sample of 0.5mg  until he get patient assistance. Please advise

## 2022-04-26 ENCOUNTER — Ambulatory Visit (INDEPENDENT_AMBULATORY_CARE_PROVIDER_SITE_OTHER): Payer: Commercial Managed Care - HMO

## 2022-04-26 VITALS — BP 117/74 | HR 81 | Temp 98.2°F | Resp 18 | Ht 71.0 in | Wt 276.4 lb

## 2022-04-26 DIAGNOSIS — D500 Iron deficiency anemia secondary to blood loss (chronic): Secondary | ICD-10-CM

## 2022-04-26 MED ORDER — SODIUM CHLORIDE 0.9 % IV SOLN
200.0000 mg | Freq: Once | INTRAVENOUS | Status: AC
  Administered 2022-04-26: 200 mg via INTRAVENOUS
  Filled 2022-04-26: qty 10

## 2022-04-26 MED ORDER — ACETAMINOPHEN 325 MG PO TABS: 650.0000 mg | ORAL_TABLET | Freq: Once | ORAL | Status: DC

## 2022-04-26 MED ORDER — DIPHENHYDRAMINE HCL 25 MG PO CAPS: 25.0000 mg | ORAL_CAPSULE | Freq: Once | ORAL | Status: DC

## 2022-04-26 NOTE — Progress Notes (Signed)
Diagnosis: Iron Deficiency Anemia  Provider:  Marshell Garfinkel MD  Procedure: Infusion  IV Type: Peripheral, IV Location: L Forearm  Venofer (Iron Sucrose), Dose: 200 mg  Infusion Start Time: 6144  Infusion Stop Time: 3154  Post Infusion IV Care: Peripheral IV Discontinued  Discharge: Condition: Good, Destination: Home . AVS provided to patient.   Performed by:  Adelina Mings, LPN

## 2022-04-27 ENCOUNTER — Ambulatory Visit: Payer: Commercial Managed Care - HMO

## 2022-05-31 ENCOUNTER — Other Ambulatory Visit (INDEPENDENT_AMBULATORY_CARE_PROVIDER_SITE_OTHER): Payer: Commercial Managed Care - HMO

## 2022-05-31 DIAGNOSIS — Z794 Long term (current) use of insulin: Secondary | ICD-10-CM | POA: Diagnosis not present

## 2022-05-31 DIAGNOSIS — E1165 Type 2 diabetes mellitus with hyperglycemia: Secondary | ICD-10-CM

## 2022-05-31 LAB — GLUCOSE, RANDOM: Glucose, Bld: 192 mg/dL — ABNORMAL HIGH (ref 70–99)

## 2022-05-31 LAB — HEMOGLOBIN A1C: Hgb A1c MFr Bld: 8.1 % — ABNORMAL HIGH (ref 4.6–6.5)

## 2022-06-03 ENCOUNTER — Ambulatory Visit: Payer: 59 | Admitting: Endocrinology

## 2022-06-04 ENCOUNTER — Other Ambulatory Visit: Payer: Self-pay

## 2022-06-04 DIAGNOSIS — E1165 Type 2 diabetes mellitus with hyperglycemia: Secondary | ICD-10-CM

## 2022-06-04 MED ORDER — HUMALOG KWIKPEN 100 UNIT/ML ~~LOC~~ SOPN: PEN_INJECTOR | SUBCUTANEOUS | 2 refills | Status: DC

## 2022-06-08 ENCOUNTER — Ambulatory Visit: Payer: Commercial Managed Care - HMO | Admitting: Endocrinology

## 2022-06-08 ENCOUNTER — Encounter: Payer: Self-pay | Admitting: Endocrinology

## 2022-06-08 ENCOUNTER — Telehealth: Payer: Self-pay

## 2022-06-08 VITALS — BP 122/80 | HR 74 | Ht 71.0 in | Wt 286.0 lb

## 2022-06-08 DIAGNOSIS — E782 Mixed hyperlipidemia: Secondary | ICD-10-CM | POA: Diagnosis not present

## 2022-06-08 DIAGNOSIS — E1165 Type 2 diabetes mellitus with hyperglycemia: Secondary | ICD-10-CM

## 2022-06-08 DIAGNOSIS — Z794 Long term (current) use of insulin: Secondary | ICD-10-CM | POA: Diagnosis not present

## 2022-06-08 MED ORDER — TRULICITY 1.5 MG/0.5ML ~~LOC~~ SOAJ: SUBCUTANEOUS | 0 refills | Status: DC

## 2022-06-08 NOTE — Telephone Encounter (Signed)
Follow up on patient Austin Alexander application. He states that they didn't receive the script and the income section of application is not legible.  Contact patient and let him know the status.

## 2022-06-08 NOTE — Progress Notes (Signed)
Patient ID: Austin Alexander, male   DOB: [birth year], 52 y.o.   MRN: [id number]           Reason for Appointment: Follow-up for Type 2 Diabetes   History of Present Illness:          Date of diagnosis of type 2 diabetes mellitus:  2009      Background history:   He is not clear when he was first diagnosed with diabetes Had been previously taking metformin and also for a few years Amaryl and Actos Insulin probably started in 2015 according to records and his A1c has been as high as 15.2 in the past He thinks Trulicity was added about a year ago At some point his A1c apparently has been 7-8% but does not know when  Recent history:    INSULIN regimen is: [place] 70 units daily, Humalog 12 units in am, 20-25 units at lunch and dinner   Non-insulin hypoglycemic drugs the patient is taking are: Ozempic 0 mg weekly, Jardiance 10 mg daily  His A1c is 8.1 compared to 7.2  Current management, blood sugar patterns and problems identified: He has not been able to get his Ozempic for 2 to 3 months especially the 1 mg dose and blood sugars are generally higher Also because of cost issues he has not used his sensor for the last 4 weeks Prior to that his Dexcom sensor shows an average of 161 with periodic postprandial spikes both in the morning and afternoon but generally variable patterns Also would be having relatively high OVERNIGHT blood sugars He takes some insulin for coffee he did not do so with his lab work and glucose was 192 Recently not experiencing hypoglycemia Also has gained 10 pounds without Ozempic In January had difficulty getting Tyler Aas because of the cost issue and likely has a high deductible He has been irregular with his Vania Rea also because of cost  Side effects from medications have been: None  Typical meal intake: Breakfast is frequently skipped, lunch is a larger meal        Interpretation of the Dexcom download for the last month as above with TIME in range  68% Previously:   CGM use % of time 57  2-week average/GV 140  Time in range       78 %  % Time Above 180 19  % Time above 250   % Time Below 70 2     PRE-MEAL Fasting Lunch Dinner Bedtime Overall  Glucose range:       Averages: 140 112      POST-MEAL PC Breakfast PC Lunch PC Dinner  Glucose range:     Averages:  162 143    Dietician visit, most recent: years ago  Weight history:  Wt Readings from Last 3 Encounters:  06/08/22 286 lb (129.7 kg)  04/26/22 276 lb 6.4 oz (125.4 kg)  04/19/22 280 lb 9.6 oz (127.3 kg)    Glycemic control:   Lab Results  Component Value Date   HGBA1C 8.1 (H) 05/31/2022   HGBA1C 7.2 (H) 01/26/2022   HGBA1C 7.0 (H) 11/09/2021   Lab Results  Component Value Date   MICROALBUR 51.4 (H) 07/23/2020   LDLCALC 33 08/05/2021   CREATININE 2.21 (H) 03/19/2022   Lab Results  Component Value Date   MICRALBCREAT 110.7 (H) 07/23/2020    Lab Results  Component Value Date   FRUCTOSAMINE 249 01/25/2022   FRUCTOSAMINE 217 02/19/2020   FRUCTOSAMINE 346 (H) 07/20/2019    No  visits with results within 1 Week(s) from this visit.  Latest known visit with results is:  Lab on 05/31/2022  Component Date Value Ref Range Status   Glucose, Bld 05/31/2022 192 (H)  70 - 99 mg/dL Final   Hgb A1c MFr Bld 05/31/2022 8.1 (H)  4.6 - 6.5 % Final   Glycemic Control Guidelines for People with Diabetes:Non Diabetic:  <6%Goal of Therapy: <7%Additional Action Suggested:  >8%     Allergies as of 06/08/2022   No Known Allergies      Medication List        Accurate as of June 08, 2022 12:55 PM. If you have any questions, ask your nurse or doctor.          STOP taking these medications    Farxiga 10 MG Tabs tablet Generic drug: dapagliflozin propanediol Stopped by: Elayne Snare, MD       TAKE these medications    B-D ULTRAFINE III SHORT PEN 31G X 8 MM Misc Generic drug: Insulin Pen Needle SMARTSIG:Pre-Filled Pen Syringe SUB-Q Daily   blood  glucose meter kit and supplies Dispense based on patient and insurance preference. Use up to four times daily as directed. (FOR ICD-9 250.00, 250.01).   carvedilol 25 MG tablet Commonly known as: COREG Take 25 mg by mouth 2 (two) times daily with a meal.   Dexcom G6 Transmitter Misc Change every 90 days   Dexcom G7 Receiver Devi USE TO CHECK BLOOD SUGAR DAILY   Dexcom G7 Sensor Misc Change every 10 days   digoxin 0.125 MG tablet Commonly known as: LANOXIN Take 125 mcg by mouth daily.   doxycycline 100 MG tablet Commonly known as: VIBRA-TABS Take 100 mg by mouth 2 (two) times daily.   fenofibrate 48 MG tablet Commonly known as: Tricor Take 1 tablet (48 mg total) by mouth daily.   HumaLOG KwikPen 100 UNIT/ML KwikPen Generic drug: insulin lispro INJECT 10 UNITS UNDER THE SKIN AT BREAKFAST, 20 TO 21 UNITS WITH LUNCH, AND 10 TO 12 UNITS WITH DINNER   irbesartan 75 MG tablet Commonly known as: AVAPRO Take 75 mg by mouth daily.   Jardiance 10 MG Tabs tablet Generic drug: empagliflozin Take 10 mg by mouth daily.   Ozempic (1 MG/DOSE) 4 MG/3ML Sopn Generic drug: Semaglutide (1 MG/DOSE) Inject 1 mg into the skin once a week.   pantoprazole 40 MG tablet Commonly known as: Protonix Take 1 tablet (40 mg total) by mouth daily.   simvastatin 40 MG tablet Commonly known as: ZOCOR Take 1 tablet (40 mg total) by mouth daily.   Tyler Aas FlexTouch 200 UNIT/ML FlexTouch Pen Generic drug: insulin degludec ADMINISTER 90 UNITS UNDER THE SKIN EVERY DAY   triamcinolone acetonide 10 MG/ML injection Commonly known as: KENALOG Inject into the skin.        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   Erectile dysfunction    Multifactorial. Improved with Cialis. New onset.   H/O folliculitis    Heartburn    Herpes exposure    History of herpetic exposure   Hyperlipidemia    Hypertension    variable in nature   Indigestion    Mild obesity    Multiple excoriations    multifactorial in origin   Situational stress    secondary to financial issues   Tobacco abuse     Past Surgical History:  Procedure Laterality Date   BALLOON ENTEROSCOPY  12/28/2021   Procedure: BALLOON ENTEROSCOPY;  Surgeon: Lavena Bullion, DO;  Location: [facility];  Service: Gastroenterology;;   ENTEROSCOPY N/A 12/28/2021   Procedure: SINGLE BALLOON ENTEROSCOPY;  Surgeon: Lavena Bullion, DO;  Location: [facility];  Service: Gastroenterology;  Laterality: N/A;   GIVENS CAPSULE STUDY N/A 12/22/2021   Procedure: GIVENS CAPSULE STUDY;  Surgeon: Clarene Essex, MD;  Location: [place];  Service: Gastroenterology;  Laterality: N/A;   HOT HEMOSTASIS N/A 12/28/2021   Procedure: HOT HEMOSTASIS (ARGON PLASMA COAGULATION/BICAP);  Surgeon: Lavena Bullion, DO;  [facility]: [facility];  Service: Gastroenterology;  Laterality: N/A;   I & D EXTREMITY Left 06/28/2014   Procedure: IRRIGATION AND DEBRIDEMENT EXTREMITY;  Surgeon: Leanora Cover, MD;  Location: [place];  Service: Orthopedics;  Laterality: Left;   INCISION AND DRAINAGE PERIRECTAL ABSCESS N/A 02/17/2014   Procedure: IRRIGATION AND DEBRIDEMENT PERIRECTAL  AND SCROTAL ABSCESS;  Surgeon: Coralie Keens, MD;  Location: [place];  Service: General;  Laterality: N/A;   MINOR IRRIGATION AND DEBRIDEMENT OF WOUND Left 06/20/2015   Procedure: IRRIGATION AND  DEBRIDEMENT Wound with nailbed repair;  Surgeon: Leanora Cover, MD;  Location: [place];  Service: Orthopedics;  Laterality: Left;   SUBMUCOSAL TATTOO INJECTION  12/28/2021   Procedure: SUBMUCOSAL TATTOO INJECTION;  Surgeon: Lavena Bullion, DO;  [facility]: [facility];  Service: Gastroenterology;;    Family History  Problem Relation Age of Onset   Diabetes type II Mother    Hypertension Mother    Iron deficiency Mother    Diabetes Other     Social History:  reports that he has been smoking cigarettes. He has a 20.00 pack-year smoking history. He has never used smokeless tobacco. He reports that he does not currently use alcohol after a past usage of about 12.0 standard drinks of alcohol per week. He reports that he does not use drugs.   Review of Systems   Lipid history: Treated by PCP with simvastatin, has mixed hyperlipidemia Triglycerides treated with fenofibrate 48 mg   Lab Results  Component Value Date   CHOL 146 01/25/2022   CHOL 121 11/09/2021   CHOL 96 08/05/2021   Lab Results  Component Value Date   HDL 33.30 (L) 01/25/2022   HDL 23.40 (L) 11/09/2021   HDL 28.50 (L) 08/05/2021   Lab Results  Component Value Date   LDLCALC 33 08/05/2021   LDLCALC 96 09/23/2013   Lab Results  Component Value Date   TRIG 213.0 (H) 01/25/2022   TRIG 373.0 (H) 11/09/2021   TRIG 172.0 (H) 08/05/2021   Lab Results  Component Value Date   CHOLHDL 4 01/25/2022   CHOLHDL 5 11/09/2021   CHOLHDL 3 08/05/2021   Lab Results  Component Value Date   LDLDIRECT 76.0 01/25/2022   LDLDIRECT 39.0 11/09/2021   LDLDIRECT 43.0 05/11/2021  Hypertension: Has been treated with carvedilol   Followed by cardiologist Dr. Terrence Dupont   BP Readings from Last 3 Encounters:  06/08/22 122/80  04/26/22 117/74  04/19/22 122/75     Right foot swelling: Has Charcot foot followed by orthopedic surgeon  Currently known complications of diabetes: Erectile dysfunction,  minimal neuropathy, microalbuminuria  CKD:   followed by nephrologist, creatinine levels as follows, most recent creatinine :  Lab Results  Component Value Date   CREATININE 2.21 (H) 03/19/2022   CREATININE 1.70 (H) 01/25/2022   CREATININE 2.20 (H) 12/23/2021   HYPERKALEMIA: Resolved  Lab Results  Component Value Date   K 5.4 (H) 03/19/2022    HYPONATREMIA: His sodium has been previously low but asymptomatic  Not on diuretics like HCTZ, not  on Lasix daily  Urine osmolality on 01/17/2020 was high at 433 but subsequently was 242 in 7/22  Sodium appears improved and and now 135   Lab Results  Component Value Date   NA 133 (L) 03/19/2022   K 5.4 (H) 03/19/2022   CL 104 03/19/2022   CO2 25 03/19/2022   Lab Results  Component Value Date   TSH 3.81 10/23/2020    LABS:  No visits with results within 1 Week(s) from this visit.  Latest known visit with results is:  Lab on 05/31/2022  Component Date Value Ref Range Status   Glucose, Bld 05/31/2022 192 (H)  70 - 99 mg/dL Final   Hgb A1c MFr Bld 05/31/2022 8.1 (H)  4.6 - 6.5 % Final   Glycemic Control Guidelines for People with Diabetes:Non Diabetic:  <6%Goal of Therapy: <7%Additional Action Suggested:  >8%     Lab Results  Component Value Date   TSH 3.81 10/23/2020    Physical Examination:  BP 122/80 (BP Location: Left Arm, Patient Position: Sitting, Cuff Size: Large)   Pulse 74   Ht '5\' 11"'$  (1.803 m)   Wt 286 lb (129.7 kg)   SpO2 96%   BMI 39.89 kg/m       ASSESSMENT:  Diabetes type 2 with severe obesity on insulin  See history of present illness for discussion of current diabetes management, blood sugar patterns and problems identified  His A1c is back up to 8.2  He is on basal bolus insulin, previously on Ozempic 1 mg On Jardiance primarily for renal benefits  With not getting his Ozempic recently because of cost his blood sugars are worse and also weight is again going up Also has had some issues  with not getting Tyler Aas consistently Since he is not monitoring his blood sugars currently difficult to assess his current insulin requirements   PLAN:    He will get the Ozempic patient assistance program filled out again In the meantime he will try Trulicity 1.5 mg weekly with a co-pay card and see if this will be affordable Patient brochure also given and discussed that after 1 month he can try 3 mg    There are no Patient Instructions on file for this visit.     Elayne Snare 06/08/2022, 12:55 PM   Note: This office note was prepared with Dragon voice recognition system technology. Any transcriptional errors that result from this process are unintentional.

## 2022-06-18 ENCOUNTER — Other Ambulatory Visit (HOSPITAL_COMMUNITY): Payer: Self-pay

## 2022-06-18 ENCOUNTER — Telehealth: Payer: Self-pay

## 2022-06-18 ENCOUNTER — Encounter: Payer: Self-pay | Admitting: Physician Assistant

## 2022-06-18 NOTE — Telephone Encounter (Signed)
Patient Advocate Encounter   Received notification from [employer] that prior authorization is required for [employer]  Submitted: n/a Key [id number]   Prior authorization is not required at this time. Patient co-pay is $71.53 for 30 day supply

## 2022-06-25 ENCOUNTER — Inpatient Hospital Stay: Payer: Commercial Managed Care - HMO | Attending: Hematology and Oncology

## 2022-06-25 ENCOUNTER — Other Ambulatory Visit: Payer: Self-pay | Admitting: Physician Assistant

## 2022-06-25 ENCOUNTER — Encounter: Payer: Self-pay | Admitting: Physician Assistant

## 2022-06-25 ENCOUNTER — Inpatient Hospital Stay (HOSPITAL_BASED_OUTPATIENT_CLINIC_OR_DEPARTMENT_OTHER): Payer: Commercial Managed Care - HMO | Admitting: Physician Assistant

## 2022-06-25 VITALS — BP 160/85 | HR 86 | Temp 98.0°F | Resp 16 | Wt 292.7 lb

## 2022-06-25 DIAGNOSIS — D500 Iron deficiency anemia secondary to blood loss (chronic): Secondary | ICD-10-CM | POA: Insufficient documentation

## 2022-06-25 DIAGNOSIS — K922 Gastrointestinal hemorrhage, unspecified: Secondary | ICD-10-CM | POA: Diagnosis not present

## 2022-06-25 DIAGNOSIS — F17210 Nicotine dependence, cigarettes, uncomplicated: Secondary | ICD-10-CM | POA: Insufficient documentation

## 2022-06-25 DIAGNOSIS — I10 Essential (primary) hypertension: Secondary | ICD-10-CM | POA: Diagnosis not present

## 2022-06-25 LAB — CBC WITH DIFFERENTIAL (CANCER CENTER ONLY)
Abs Immature Granulocytes: 0.03 10*3/uL (ref 0.00–0.07)
Basophils Absolute: 0 10*3/uL (ref 0.0–0.1)
Basophils Relative: 0 %
Eosinophils Absolute: 0.6 10*3/uL — ABNORMAL HIGH (ref 0.0–0.5)
Eosinophils Relative: 7 %
HCT: 35.3 % — ABNORMAL LOW (ref 39.0–52.0)
Hemoglobin: 11.9 g/dL — ABNORMAL LOW (ref 13.0–17.0)
Immature Granulocytes: 0 %
Lymphocytes Relative: 14 %
Lymphs Abs: 1.1 10*3/uL (ref 0.7–4.0)
MCH: 27 pg (ref 26.0–34.0)
MCHC: 33.7 g/dL (ref 30.0–36.0)
MCV: 80 fL (ref 80.0–100.0)
Monocytes Absolute: 0.6 10*3/uL (ref 0.1–1.0)
Monocytes Relative: 7 %
Neutro Abs: 5.9 10*3/uL (ref 1.7–7.7)
Neutrophils Relative %: 72 %
Platelet Count: 342 10*3/uL (ref 150–400)
RBC: 4.41 MIL/uL (ref 4.22–5.81)
RDW: 18.6 % — ABNORMAL HIGH (ref 11.5–15.5)
WBC Count: 8.2 10*3/uL (ref 4.0–10.5)
nRBC: 0 % (ref 0.0–0.2)

## 2022-06-25 LAB — IRON AND IRON BINDING CAPACITY (CC-WL,HP ONLY)
Iron: 39 ug/dL — ABNORMAL LOW (ref 45–182)
Saturation Ratios: 16 % — ABNORMAL LOW (ref 17.9–39.5)
TIBC: 244 ug/dL — ABNORMAL LOW (ref 250–450)
UIBC: 205 ug/dL (ref 117–376)

## 2022-06-25 LAB — FERRITIN: Ferritin: 51 ng/mL (ref 24–336)

## 2022-06-25 NOTE — Progress Notes (Unsigned)
[phone]   [phone]  PROGRESS NOTE  Patient Care Team: Rogers Blocker, MD as PCP - General (Internal Medicine)  Hematological/Oncological History 12/21/2021-12/23/2021: Admitted due to acute iron deficiency anemia with Hgb 6.7 and hemoccult test positive. He was given 3 units of PRBC and IV ferrlecit 250 mg x 2 doses. CT scan did show mildly nodular hepatic contour concerning for cirrhosis. Capsular endoscopy showed portal gastropathy and two small bowel bleeding sites. Hgb improved to 8.0 at discharge. 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  03/19/2022: Establish care with [facility] Hematology  CHIEF COMPLAINTS/PURPOSE OF CONSULTATION:  Iron deficiency anemia  HISTORY OF PRESENTING ILLNESS:  Austin Alexander 52 y.o. male returns for a follow up for iron deficiency anemia. He is unaccompanied for this visit.   On exam today, Austin Alexander reports he is doing well and tolerating PO iron therapy. He denies any easy bruising or signs of bleeding. He is otherwise feeling well without any new or concerning symptoms. He denies fevers, chills, night sweats, shortness of breath, chest pain, cough, nausea, vomiting, diarrhea or constipation. He has no other complaints. Rest of the 10 point ROS is below.   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   Erectile dysfunction    Multifactorial. Improved with Cialis. New onset.   H/O folliculitis    Heartburn    Herpes exposure    History of herpetic exposure   Hyperlipidemia    Hypertension    variable in nature   Indigestion    Mild obesity    Multiple excoriations    multifactorial in origin   Situational stress    secondary to financial issues   Tobacco abuse     SURGICAL HISTORY: Past Surgical History:  Procedure Laterality Date   BALLOON ENTEROSCOPY  12/28/2021   Procedure: BALLOON ENTEROSCOPY;  Surgeon: Lavena Bullion, DO;  [facility];  Service: Gastroenterology;;   ENTEROSCOPY N/A 12/28/2021   Procedure: SINGLE BALLOON ENTEROSCOPY;  Surgeon: Lavena Bullion, DO;  [facility];  Service: Gastroenterology;  Laterality: N/A;   GIVENS CAPSULE STUDY N/A 12/22/2021   Procedure: GIVENS CAPSULE STUDY;  Surgeon: Clarene Essex, MD;  Location: [place];  Service: Gastroenterology;  Laterality: N/A;   HOT HEMOSTASIS N/A 12/28/2021   Procedure: HOT HEMOSTASIS (ARGON PLASMA COAGULATION/BICAP);  Surgeon: Lavena Bullion, DO;  [facility];  Service: Gastroenterology;  Laterality: N/A;   I & D EXTREMITY Left 06/28/2014   Procedure: IRRIGATION AND DEBRIDEMENT EXTREMITY;  Surgeon: Leanora Cover, MD;  Location: [place];  Service: Orthopedics;  Laterality: Left;   INCISION AND DRAINAGE PERIRECTAL ABSCESS N/A 02/17/2014   Procedure: IRRIGATION AND DEBRIDEMENT PERIRECTAL  AND SCROTAL ABSCESS;  Surgeon: Coralie Keens, MD;  Location:  [place] [facility];  Service: General;  Laterality: N/A;   MINOR IRRIGATION AND DEBRIDEMENT OF WOUND Left 06/20/2015   Procedure: IRRIGATION AND DEBRIDEMENT Wound with nailbed repair;  Surgeon: Leanora Cover, MD;  Location: [place];  Service: Orthopedics;  Laterality: Left;   SUBMUCOSAL TATTOO INJECTION  12/28/2021   Procedure: SUBMUCOSAL TATTOO INJECTION;  Surgeon: Lavena Bullion, DO;  [facility];  Service: Gastroenterology;;    SOCIAL HISTORY: Social History   Socioeconomic History   Marital status: Single    Spouse name: Not on file   Number of children: Not on file   Years of education: Not on file   Highest education level: Not on file  Occupational History   Not on file  Tobacco Use   Smoking status: Every Day    Packs/day: 1.00    Years: 20.00    Additional pack years: 0.00    Total pack years: 20.00    Types: Cigarettes   Smokeless tobacco: Never  Vaping Use   Vaping Use: Never used  Substance and Sexual Activity   Alcohol use: Not Currently    Alcohol/week: 12.0 standard drinks of alcohol    Types: 10 Cans of beer, 2 Shots of liquor per week    Comment: As of 04/06/14 - states occasional use   Drug use: No   Sexual activity: Yes  Other Topics Concern   Not on file  Social History Narrative   Not on file   Social Determinants of Health   Financial Resource Strain: Not on file  Food Insecurity: No Food Insecurity (12/22/2021)   Hunger Vital Sign    Worried About Running Out of Food in the Last Year: Never true    Ran Out of Food in the Last Year: Never true  Transportation Needs: No Transportation Needs (12/22/2021)   PRAPARE - Hydrologist (Medical): No    Lack of Transportation (Non-Medical): No  Physical Activity: Not on file  Stress: Not on file  Social Connections: Not on file  Intimate Partner Violence: Not At Risk (12/22/2021)   Humiliation, Afraid, Rape, and Kick questionnaire    Fear of Current or Ex-Partner: No    Emotionally Abused: No    Physically Abused: No    Sexually Abused: No    FAMILY HISTORY: Family History  Problem Relation Age of Onset   Diabetes type II Mother    Hypertension Mother    Iron deficiency Mother    Diabetes Other     ALLERGIES:  has No Known Allergies.  MEDICATIONS:  Current Outpatient Medications  Medication Sig Dispense Refill   B-D ULTRAFINE III SHORT PEN 31G  X 8 MM MISC SMARTSIG:Pre-Filled Pen Syringe SUB-Q Daily     blood glucose meter kit and supplies Dispense based on patient and insurance preference. Use up to four times daily as directed. (FOR ICD-9 250.00, 250.01). 1 each 0   carvedilol (COREG) 25 MG tablet Take 25 mg by mouth 2 (two) times daily with a meal.     Continuous Blood Gluc Receiver (DEXCOM G7 RECEIVER) DEVI USE TO CHECK BLOOD SUGAR DAILY 1 each 0   Continuous Blood Gluc Sensor (DEXCOM G7 SENSOR) MISC Change every 10 days 3 each 3   Continuous Blood Gluc Transmit (DEXCOM G6 TRANSMITTER) MISC Change every 90 days 3 each 1   digoxin (LANOXIN) 0.125 MG tablet Take 125 mcg by mouth daily.     doxycycline (VIBRA-TABS) 100 MG tablet Take 100 mg  by mouth 2 (two) times daily.     Dulaglutide (TRULICITY) 1.5 0000000 SOPN Inject weekly 2 mL 0   fenofibrate (TRICOR) 48 MG tablet Take 1 tablet (48 mg total) by mouth daily. 90 tablet 1   HUMALOG KWIKPEN 100 UNIT/ML KwikPen INJECT 10 UNITS UNDER THE SKIN AT BREAKFAST, 20 TO 21 UNITS WITH LUNCH, AND 10 TO 12 UNITS WITH DINNER 15 mL 2   irbesartan (AVAPRO) 75 MG tablet Take 75 mg by mouth daily.     JARDIANCE 10 MG TABS tablet Take 10 mg by mouth daily.     pantoprazole (PROTONIX) 40 MG tablet Take 1 tablet (40 mg total) by mouth daily. 30 tablet 1   Semaglutide, 1 MG/DOSE, (OZEMPIC, 1 MG/DOSE,) 4 MG/3ML SOPN Inject 1 mg into the skin once a week. (Patient not taking: Reported on 06/08/2022) 9 mL 1   simvastatin (ZOCOR) 40 MG tablet Take 1 tablet (40 mg total) by mouth daily. 30 tablet 0   TRESIBA FLEXTOUCH 200 UNIT/ML FlexTouch Pen ADMINISTER 90 UNITS UNDER THE SKIN EVERY DAY 15 mL 2   triamcinolone acetonide (KENALOG) 10 MG/ML injection Inject into the skin.     No current facility-administered medications for this visit.    REVIEW OF SYSTEMS:   Constitutional: ( - ) fevers, ( - )  chills , ( - ) night sweats Eyes: ( - ) blurriness of vision, ( - ) double vision, ( - ) watery eyes Ears, nose,  mouth, throat, and face: ( - ) mucositis, ( - ) sore throat Respiratory: ( - ) cough, ( - ) dyspnea, ( - ) wheezes Cardiovascular: ( - ) palpitation, ( - ) chest discomfort, ( - ) lower extremity swelling Gastrointestinal:  ( - ) nausea, ( - ) heartburn, ( - ) change in bowel habits Skin: ( - ) abnormal skin rashes Lymphatics: ( - ) new lymphadenopathy, ( - ) easy bruising Neurological: ( - ) numbness, ( - ) tingling, ( - ) new weaknesses Behavioral/Psych: ( - ) mood change, ( - ) new changes  All other systems were reviewed with the patient and are negative.  PHYSICAL EXAMINATION: ECOG PERFORMANCE STATUS: 0 - Asymptomatic  Vitals:   06/25/22 1451 06/25/22 1500  BP: (!) 179/91 (!) 160/85  Pulse: 86   Resp: 16   Temp: 98 F (36.7 C)   SpO2: 96%    Filed Weights   06/25/22 1451  Weight: 292 lb 11.2 oz (132.8 kg)    GENERAL: well appearing male in NAD  SKIN: skin color, texture, turgor are normal, no rashes or significant lesions EYES: conjunctiva are pink and non-injected, sclera clear LUNGS: clear to auscultation and percussion with normal breathing effort HEART: regular rate & rhythm and no murmurs and no lower extremity edema Musculoskeletal: no cyanosis of digits and no clubbing  PSYCH: alert & oriented x 3, fluent speech NEURO: no focal motor/sensory deficits  LABORATORY DATA:  I have reviewed the data as listed    Latest Ref Rng & Units 06/25/2022    2:35 PM 03/19/2022   10:04 AM 12/28/2021   12:58 PM  CBC  WBC 4.0 - 10.5 K/uL 8.2  7.9  11.8   Hemoglobin 13.0 - 17.0 g/dL 11.9  10.2  9.4   Hematocrit 39.0 - 52.0 % 35.3  33.0  32.7   Platelets 150 - 400 K/uL 342  522  455        Latest Ref Rng & Units 05/31/2022  9:58 AM 03/19/2022   10:04 AM 01/25/2022   11:35 AM  CMP  Glucose 70 - 99 mg/dL 192  128  82   BUN 6 - 20 mg/dL  33  27   Creatinine 0.61 - 1.24 mg/dL  2.21  1.70   Sodium 135 - 145 mmol/L  133  135   Potassium 3.5 - 5.1 mmol/L  5.4  4.8   Chloride  98 - 111 mmol/L  104  102   CO2 22 - 32 mmol/L  25  23   Calcium 8.9 - 10.3 mg/dL  9.1  9.2   Total Protein 6.5 - 8.1 g/dL  8.4    Total Bilirubin 0.3 - 1.2 mg/dL  0.3    Alkaline Phos 38 - 126 U/L  128    AST 15 - 41 U/L  9    ALT 0 - 44 U/L  10      ASSESSMENT & PLAN Austin Alexander is a 52 y.o. male returns for a follow up for iron deficiency anemia.   #Iron deficiency anemia 2/2 GI bleed: --Underwent capsular endoscopy on 12/22/2021 that showed probable portal gastropathy and two small bowel bleeding sites. 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  --Patient is to have outpatient follow up with Dr. Alessandra Bevels,. We will forward labs and request a follow up.  --Received IV venofer 200 mg x 5 doses from 03/30/2022-04/26/2022.  --Currently on ferrous sulfate 325 mg once daily. Recommend to continue once a day with a source of vitamin C --Labs today show improvement of anemia with Hgb of 11.9, MCV 80.0. Iron panel shows serum ion 29, saturation 16%, ferritin 51.  --Recommend another round of IV venofer to help bolster levels.  --RTC in 3 months with repeat labs.   #Hypertension:  --Patient is on carvedilol 25 mg BID --Encouraged smoking cessation --Patient is asymptomatic without any headaches, dizziness, etc --Advised to monitor BP at home daily and follow up with PCP    No orders of the defined types were placed in this encounter.   All questions were answered. The patient knows to call the clinic with any problems, questions or concerns.  I have spent a total of 25 minutes minutes of face-to-face and non-face-to-face time, preparing to see the patient, performing a medically appropriate examination, counseling and educating the patient, ordering medications,documenting clinical information in the electronic health record,  and care coordination.   Dede Query, PA-C Department of Hematology/Oncology [place] at [facility] Phone: [phone]

## 2022-06-28 ENCOUNTER — Telehealth: Payer: Self-pay

## 2022-06-28 ENCOUNTER — Encounter: Payer: Self-pay | Admitting: Physician Assistant

## 2022-06-28 ENCOUNTER — Telehealth: Payer: Self-pay | Admitting: Pharmacy Technician

## 2022-06-28 NOTE — Telephone Encounter (Signed)
Can you notify patient that he will need another round of IV iron to improve his Hgb levels  Also, can you forward last note and labs to Dr. Ihor Austin and request a follow up since he has persistent anemia. Just to see if he needs another scope    Pt advised with VU and agreed to plan.  Records faxed to [facility] requesting an appt for evaluation. Confirmation received

## 2022-06-28 NOTE — Telephone Encounter (Signed)
Kristin Bruins note:  Auth Submission: NO AUTH NEEDED Site of care: Site of care: [facility] Payer: [employer] Medication & CPT/J Code(s) submitted: Venofer (Iron Sucrose) J1756 Route of submission (phone, fax, portal):  Phone # Fax # Auth type: Buy/Bill Units/visits requested: 5 Reference number:  Approval from: 06/11/22 to 10/28/22   Patient will be scheduled as soon as possible

## 2022-06-30 ENCOUNTER — Telehealth: Payer: Self-pay | Admitting: Physician Assistant

## 2022-06-30 NOTE — Telephone Encounter (Signed)
Contacted patient to scheduled appointments. Patient is aware of appointments that are scheduled.   

## 2022-07-02 NOTE — Telephone Encounter (Signed)
Informed pt that his patient assistance application to [employer] for [employer] and [employer] was faxed in January with no update yet. Informed pt that we will go ahead and refax the application.

## 2022-07-05 ENCOUNTER — Ambulatory Visit (INDEPENDENT_AMBULATORY_CARE_PROVIDER_SITE_OTHER): Payer: Commercial Managed Care - HMO

## 2022-07-05 VITALS — BP 130/77 | HR 64 | Temp 97.4°F | Resp 18 | Ht 70.0 in | Wt 288.8 lb

## 2022-07-05 DIAGNOSIS — K922 Gastrointestinal hemorrhage, unspecified: Secondary | ICD-10-CM

## 2022-07-05 DIAGNOSIS — D500 Iron deficiency anemia secondary to blood loss (chronic): Secondary | ICD-10-CM | POA: Diagnosis not present

## 2022-07-05 MED ORDER — SODIUM CHLORIDE 0.9 % IV SOLN
200.0000 mg | Freq: Once | INTRAVENOUS | Status: AC
  Administered 2022-07-05: 200 mg via INTRAVENOUS
  Filled 2022-07-05: qty 10

## 2022-07-05 NOTE — Progress Notes (Signed)
Diagnosis: Iron Deficiency Anemia  Provider:  Marshell Garfinkel MD  Procedure: Infusion  IV Type: Peripheral, IV Location: L Antecubital  Venofer (Iron Sucrose), Dose: 200 mg  Infusion Start Time: [id number]  Infusion Stop Time: 1050  Post Infusion IV Care: Peripheral IV Discontinued  Discharge: Condition: Good, Destination: Home . AVS Declined  Performed by:  Adelina Mings, LPN

## 2022-07-12 ENCOUNTER — Ambulatory Visit (INDEPENDENT_AMBULATORY_CARE_PROVIDER_SITE_OTHER): Payer: Commercial Managed Care - HMO

## 2022-07-12 VITALS — BP 131/77 | HR 66 | Temp 98.2°F | Resp 18 | Ht 71.0 in | Wt 287.8 lb

## 2022-07-12 DIAGNOSIS — K922 Gastrointestinal hemorrhage, unspecified: Secondary | ICD-10-CM | POA: Diagnosis not present

## 2022-07-12 DIAGNOSIS — D500 Iron deficiency anemia secondary to blood loss (chronic): Secondary | ICD-10-CM | POA: Diagnosis not present

## 2022-07-12 MED ORDER — SODIUM CHLORIDE 0.9 % IV SOLN
200.0000 mg | Freq: Once | INTRAVENOUS | Status: AC
  Administered 2022-07-12: 200 mg via INTRAVENOUS
  Filled 2022-07-12: qty 10

## 2022-07-12 NOTE — Progress Notes (Signed)
Diagnosis: Iron Deficiency Anemia  Provider:  Marshell Garfinkel MD  Procedure: Infusion  IV Type: Peripheral, IV Location: L Forearm  Venofer (Iron Sucrose), Dose: 200 mg  Infusion Start Time: [id number]  Infusion Stop Time: [id number]  Post Infusion IV Care: Patient declined observation and Peripheral IV Discontinued  Discharge: Condition: Good, Destination: Home . AVS Provided and AVS Declined  Performed by:  Eugenia Mcalpine, LPN

## 2022-07-19 ENCOUNTER — Ambulatory Visit (INDEPENDENT_AMBULATORY_CARE_PROVIDER_SITE_OTHER): Payer: Commercial Managed Care - HMO

## 2022-07-19 VITALS — BP 137/83 | HR 70 | Temp 97.6°F | Resp 16 | Ht 71.0 in | Wt 287.6 lb

## 2022-07-19 DIAGNOSIS — D500 Iron deficiency anemia secondary to blood loss (chronic): Secondary | ICD-10-CM

## 2022-07-19 DIAGNOSIS — K922 Gastrointestinal hemorrhage, unspecified: Secondary | ICD-10-CM

## 2022-07-19 MED ORDER — SODIUM CHLORIDE 0.9 % IV SOLN
200.0000 mg | Freq: Once | INTRAVENOUS | Status: AC
  Administered 2022-07-19: 200 mg via INTRAVENOUS
  Filled 2022-07-19: qty 10

## 2022-07-19 NOTE — Progress Notes (Signed)
Diagnosis: Iron Deficiency Anemia  Provider:  Chilton Greathouse MD  Procedure: Infusion  IV Type: Peripheral, IV Location: L Forearm  Venofer (Iron Sucrose), Dose: 200 mg  Infusion Start Time: 1016  Infusion Stop Time: 1030  Post Infusion IV Care: Peripheral IV Discontinued  Discharge: Condition: Good, Destination: Home . AVS Provided and AVS Declined  Performed by:  Adriana Mccallum, RN

## 2022-07-26 ENCOUNTER — Ambulatory Visit (INDEPENDENT_AMBULATORY_CARE_PROVIDER_SITE_OTHER): Payer: Commercial Managed Care - HMO | Admitting: *Deleted

## 2022-07-26 VITALS — BP 151/83 | HR 71 | Temp 97.5°F | Resp 18 | Ht 71.0 in | Wt 295.0 lb

## 2022-07-26 DIAGNOSIS — D500 Iron deficiency anemia secondary to blood loss (chronic): Secondary | ICD-10-CM

## 2022-07-26 DIAGNOSIS — K922 Gastrointestinal hemorrhage, unspecified: Secondary | ICD-10-CM

## 2022-07-26 MED ORDER — SODIUM CHLORIDE 0.9 % IV SOLN
200.0000 mg | Freq: Once | INTRAVENOUS | Status: AC
  Administered 2022-07-26: 200 mg via INTRAVENOUS
  Filled 2022-07-26: qty 10

## 2022-07-26 NOTE — Progress Notes (Signed)
Diagnosis: Iron Deficiency Anemia  Provider:  Chilton Greathouse MD  Procedure: Infusion  IV Type: Peripheral, IV Location: L Forearm  Venofer (Iron Sucrose), Dose: 200 mg  Infusion Start Time: 1018 am  Infusion Stop Time: 1039 am  Post Infusion IV Care: Observation period completed and Peripheral IV Discontinued  Discharge: Condition: Good, Destination: Home . AVS Declined  Performed by:  Forrest Moron, RN

## 2022-08-02 ENCOUNTER — Ambulatory Visit (INDEPENDENT_AMBULATORY_CARE_PROVIDER_SITE_OTHER): Payer: Commercial Managed Care - HMO

## 2022-08-02 VITALS — BP 150/81 | HR 70 | Temp 98.7°F | Resp 18 | Ht 71.0 in | Wt 295.2 lb

## 2022-08-02 DIAGNOSIS — K922 Gastrointestinal hemorrhage, unspecified: Secondary | ICD-10-CM | POA: Diagnosis not present

## 2022-08-02 DIAGNOSIS — D500 Iron deficiency anemia secondary to blood loss (chronic): Secondary | ICD-10-CM | POA: Diagnosis not present

## 2022-08-02 MED ORDER — SODIUM CHLORIDE 0.9 % IV SOLN
200.0000 mg | Freq: Once | INTRAVENOUS | Status: AC
  Administered 2022-08-02: 200 mg via INTRAVENOUS
  Filled 2022-08-02: qty 10

## 2022-08-02 NOTE — Progress Notes (Signed)
Diagnosis: Iron Deficiency Anemia  Provider:  Chilton Greathouse MD  Procedure: Infusion  IV Type: Peripheral, IV Location: L Antecubital  Venofer (Iron Sucrose), Dose: 200 mg  Infusion Start Time: 1019  Infusion Stop Time: 1045  Post Infusion IV Care: Peripheral IV Discontinued  Discharge: Condition: Good, Destination: Home . AVS Declined  Performed by:  Loney Hering, LPN

## 2022-08-05 ENCOUNTER — Telehealth: Payer: Self-pay

## 2022-08-05 DIAGNOSIS — E1165 Type 2 diabetes mellitus with hyperglycemia: Secondary | ICD-10-CM

## 2022-08-05 MED ORDER — DEXCOM G7 SENSOR MISC
3 refills | Status: DC
Start: 2022-08-05 — End: 2022-12-23

## 2022-08-05 NOTE — Telephone Encounter (Signed)
Pt needs a PA on Trulicity.

## 2022-08-10 ENCOUNTER — Telehealth: Payer: Self-pay | Admitting: Pharmacy Technician

## 2022-08-10 ENCOUNTER — Encounter: Payer: Self-pay | Admitting: Physician Assistant

## 2022-08-10 ENCOUNTER — Other Ambulatory Visit (HOSPITAL_COMMUNITY): Payer: Self-pay

## 2022-08-10 NOTE — Telephone Encounter (Signed)
Noted  

## 2022-08-10 NOTE — Telephone Encounter (Signed)
Pharmacy Patient Advocate Encounter  Insurance verification completed.    The patient is insured through [place].   Per test claim for Trulicity: rx was filled 08/08/22. Next available fill date is 08/21/22.

## 2022-08-13 ENCOUNTER — Other Ambulatory Visit: Payer: Self-pay

## 2022-08-13 DIAGNOSIS — E1165 Type 2 diabetes mellitus with hyperglycemia: Secondary | ICD-10-CM

## 2022-08-13 MED ORDER — TRESIBA FLEXTOUCH 200 UNIT/ML ~~LOC~~ SOPN
90.0000 [IU] | PEN_INJECTOR | Freq: Every day | SUBCUTANEOUS | 2 refills | Status: DC
Start: 2022-08-13 — End: 2022-12-24

## 2022-08-25 ENCOUNTER — Other Ambulatory Visit: Payer: Self-pay

## 2022-08-25 DIAGNOSIS — E1165 Type 2 diabetes mellitus with hyperglycemia: Secondary | ICD-10-CM

## 2022-08-25 MED ORDER — HUMALOG KWIKPEN 100 UNIT/ML ~~LOC~~ SOPN
PEN_INJECTOR | SUBCUTANEOUS | 0 refills | Status: DC
Start: 2022-08-25 — End: 2023-04-01

## 2022-08-30 ENCOUNTER — Other Ambulatory Visit: Payer: Self-pay | Admitting: Gastroenterology

## 2022-08-30 DIAGNOSIS — R9389 Abnormal findings on diagnostic imaging of other specified body structures: Secondary | ICD-10-CM

## 2022-09-02 ENCOUNTER — Other Ambulatory Visit (INDEPENDENT_AMBULATORY_CARE_PROVIDER_SITE_OTHER): Payer: Commercial Managed Care - HMO

## 2022-09-02 ENCOUNTER — Telehealth: Payer: Self-pay

## 2022-09-02 DIAGNOSIS — Z794 Long term (current) use of insulin: Secondary | ICD-10-CM | POA: Diagnosis not present

## 2022-09-02 DIAGNOSIS — E1165 Type 2 diabetes mellitus with hyperglycemia: Secondary | ICD-10-CM | POA: Diagnosis not present

## 2022-09-02 DIAGNOSIS — E782 Mixed hyperlipidemia: Secondary | ICD-10-CM | POA: Diagnosis not present

## 2022-09-02 LAB — LIPID PANEL
Cholesterol: 124 mg/dL (ref 0–200)
HDL: 31.4 mg/dL — ABNORMAL LOW (ref >39.00)
LDL Cholesterol: 60 mg/dL (ref 0–99)
NonHDL: 92.54
Total CHOL/HDL Ratio: 4
Triglycerides: 163 mg/dL — ABNORMAL HIGH (ref 0.0–149.0)
VLDL: 32.6 mg/dL (ref 0.0–40.0)

## 2022-09-02 LAB — BASIC METABOLIC PANEL
BUN: 26 mg/dL — ABNORMAL HIGH (ref 6–23)
CO2: 26 mEq/L (ref 19–32)
Calcium: 8.9 mg/dL (ref 8.4–10.5)
Chloride: 101 mEq/L (ref 96–112)
Creatinine, Ser: 1.79 mg/dL — ABNORMAL HIGH (ref 0.40–1.50)
GFR: 42.56 mL/min — ABNORMAL LOW (ref >60.00)
Glucose, Bld: 43 mg/dL — CL (ref 70–99)
Potassium: 4.6 mEq/L (ref 3.5–5.1)
Sodium: 134 mEq/L — ABNORMAL LOW (ref 135–145)

## 2022-09-02 LAB — HEMOGLOBIN A1C: Hgb A1c MFr Bld: 7.6 % — ABNORMAL HIGH (ref 4.6–6.5)

## 2022-09-02 NOTE — Telephone Encounter (Signed)
Critical lab glucose 43.  Spoke with patient he has eaten and glucose is now 128.  Feels fine.

## 2022-09-02 NOTE — Telephone Encounter (Signed)
Lab called at 2:41 PM with critical results and call was transferred to clinical staff at 2:45 PM.

## 2022-09-04 ENCOUNTER — Other Ambulatory Visit (HOSPITAL_COMMUNITY): Payer: Self-pay

## 2022-09-07 ENCOUNTER — Encounter: Payer: Self-pay | Admitting: Endocrinology

## 2022-09-07 ENCOUNTER — Ambulatory Visit: Payer: Commercial Managed Care - HMO | Admitting: Endocrinology

## 2022-09-07 VITALS — BP 140/80 | HR 78 | Ht 71.0 in | Wt 299.8 lb

## 2022-09-07 DIAGNOSIS — E1165 Type 2 diabetes mellitus with hyperglycemia: Secondary | ICD-10-CM

## 2022-09-07 DIAGNOSIS — E1169 Type 2 diabetes mellitus with other specified complication: Secondary | ICD-10-CM

## 2022-09-07 DIAGNOSIS — E782 Mixed hyperlipidemia: Secondary | ICD-10-CM

## 2022-09-07 DIAGNOSIS — Z794 Long term (current) use of insulin: Secondary | ICD-10-CM | POA: Diagnosis not present

## 2022-09-07 MED ORDER — TRULICITY 3 MG/0.5ML ~~LOC~~ SOAJ: 3.0000 mg | SUBCUTANEOUS | 2 refills | Status: DC

## 2022-09-07 NOTE — Patient Instructions (Addendum)
No Humalog in am  At lunch 12-16 units and 10   Tresiba 84 units daily

## 2022-09-07 NOTE — Progress Notes (Signed)
Patient ID: Austin Alexander, male   DOB: 1971/03/12, 52 y.o.   MRN: [id number]           Reason for Appointment: Follow-up for Type 2 Diabetes   History of Present Illness:          Date of diagnosis of type 2 diabetes mellitus:  2009      Background history:   He is not clear when he was first diagnosed with diabetes Had been previously taking metformin and also for a few years Amaryl and Actos Insulin probably started in 2015 according to records and his A1c has been as high as 15.2 in the past He thinks Trulicity was added about a year ago At some point his A1c apparently has been 7-8% but does not know when  Recent history:    INSULIN regimen is: [place] 90 units daily, Humalog 10 units in am, 20 units at lunch and dinner   Non-insulin hypoglycemic drugs the patient is taking are:  Jardiance 10 mg daily Trulicity 1.5 mg weekly  His A1c is 7.6, previously 8.1   Current management, blood sugar patterns and problems identified: He was not been able to get his Ozempic for 2 to 3 months because of insurance coverage and he did get approval for Trulicity through his insurance but only started about a month ago He is also concerned about the cost of [place] With taking Trulicity he has had generally much better blood sugars throughout the day and appears to be getting hypoglycemic with some of his doses of Humalog for his coffee in the morning and at lunchtime Also in the lab his glucose was only 43 because he took Humalog long before eating Usually not eating or proper meal in the evening and mostly snacks with somewhat higher readings at times He has been getting his his refills but concerned about the cost and nephrologist is going to switch him to [place] which is better covered He says he is walking during the day although not any programmed exercise Weight is about the same although was previously gaining weight without any GLP-1 drugs  Side effects from medications have been:  None  Typical meal intake: Breakfast is frequently skipped, lunch is a larger meal        Interpretation of the [employer] .  His blood sugars are relatively evenly controlled at all times with blood sugars ranging between an average of 100 to about 146 Overnight blood sugars are very stable in the low 100 range without hypoglycemia He has no significant postprandial hyperglycemia with only mild increase in insulin after about 8 PM and only occasionally over 200 Blood sugars are tending to be low frequently around 9-11 AM and also 12 noon-5 PM but usually transient Premeal blood sugars are also in the low 100 range at lunch and dinner  CGM use % of time   2-week average/GV 121  Time in range    91    %  % Time Above 180 3  % Time above 250 6  % Time Below 70      PRE-MEAL Fasting Lunch Dinner Bedtime Overall  Glucose range:       Averages: 124       POST-MEAL PC Breakfast PC Lunch PC Dinner  Glucose range:     Averages:   146   Prior    CGM use % of time 57  2-week average/GV 140  Time in range       78 %  %  Time Above 180 19  % Time above 250   % Time Below 70 2     PRE-MEAL Fasting Lunch Dinner Bedtime Overall  Glucose range:       Averages: 140 112      POST-MEAL PC Breakfast PC Lunch PC Dinner  Glucose range:     Averages:  162 143    Dietician visit, most recent: years ago  Weight history:  Wt Readings from Last 3 Encounters:  09/07/22 299 lb 12.8 oz (136 kg)  08/02/22 295 lb 3.2 oz (133.9 kg)  07/26/22 295 lb (133.8 kg)    Glycemic control:   Lab Results  Component Value Date   HGBA1C 7.6 (H) 09/02/2022   HGBA1C 8.1 (H) 05/31/2022   HGBA1C 7.2 (H) 01/26/2022   Lab Results  Component Value Date   MICROALBUR 51.4 (H) 07/23/2020   LDLCALC 60 09/02/2022   CREATININE 1.79 (H) 09/02/2022   Lab Results  Component Value Date   MICRALBCREAT 110.7 (H) 07/23/2020    Lab Results  Component Value Date   FRUCTOSAMINE 249 01/25/2022    FRUCTOSAMINE 217 02/19/2020   FRUCTOSAMINE 346 (H) 07/20/2019    Lab on 09/02/2022  Component Date Value Ref Range Status   Cholesterol 09/02/2022 124  0 - 200 mg/dL Final   ATP III Classification       Desirable:  < 200 mg/dL               Borderline High:  200 - 239 mg/dL          High:  > = 161 mg/dL   Triglycerides 09/60/4540 163.0 (H)  0.0 - 149.0 mg/dL Final   Normal:  <981 mg/dLBorderline High:  150 - 199 mg/dL   HDL 19/14/7829 56.21 (L)  >30.86 mg/dL Final   VLDL 57/84/6962 32.6  0.0 - 40.0 mg/dL Final   LDL Cholesterol 09/02/2022 60  0 - 99 mg/dL Final   Total CHOL/HDL Ratio 09/02/2022 4   Final                  Men          Women1/2 Average Risk     3.4          3.3Average Risk          5.0          4.42X Average Risk          9.6          7.13X Average Risk          15.0          11.0                       NonHDL 09/02/2022 92.54   Final   NOTE:  Non-HDL goal should be 30 mg/dL higher than patient's LDL goal (i.e. LDL goal of < 70 mg/dL, would have non-HDL goal of < 100 mg/dL)   Sodium 95/28/4132 440 (L)  135 - 145 mEq/L Final   Potassium 09/02/2022 4.6  3.5 - 5.1 mEq/L Final   Chloride 09/02/2022 101  96 - 112 mEq/L Final   CO2 09/02/2022 26  19 - 32 mEq/L Final   Glucose, Bld 09/02/2022 43 Repeated and verified X2. (LL)  70 - 99 mg/dL Final   BUN 02/06/2535 26 (H)  6 - 23 mg/dL Final   Creatinine, Ser 09/02/2022 1.79 (H)  0.40 - 1.50 mg/dL Final   GFR 64/40/3474  42.56 (L)  >60.00 mL/min Final   Calculated using the CKD-EPI Creatinine Equation (2021)   Calcium 09/02/2022 8.9  8.4 - 10.5 mg/dL Final   Hgb Z6X MFr Bld 09/02/2022 7.6 (H)  4.6 - 6.5 % Final   Glycemic Control Guidelines for People with Diabetes:Non Diabetic:  <6%Goal of Therapy: <7%Additional Action Suggested:  >8%     Allergies as of 09/07/2022   No Known Allergies      Medication List        Accurate as of Sep 07, 2022 11:42 AM. If you have any questions, ask your nurse or doctor.          STOP  taking these medications    Ozempic (1 MG/DOSE) 4 MG/3ML Sopn Generic drug: Semaglutide (1 MG/DOSE) Stopped by: Reather Littler, MD   Trulicity 1.5 MG/0.5ML Sopn Generic drug: Dulaglutide Replaced by: Trulicity 3 MG/0.5ML Sopn Stopped by: Reather Littler, MD       TAKE these medications    B-D ULTRAFINE III SHORT PEN 31G X 8 MM Misc Generic drug: Insulin Pen Needle SMARTSIG:Pre-Filled Pen Syringe SUB-Q Daily   blood glucose meter kit and supplies Dispense based on patient and insurance preference. Use up to four times daily as directed. (FOR ICD-9 250.00, 250.01).   carvedilol 25 MG tablet Commonly known as: COREG Take 25 mg by mouth 2 (two) times daily with a meal.   Dexcom G6 Transmitter Misc Change every 90 days   Dexcom G7 Receiver Devi USE TO CHECK BLOOD SUGAR DAILY   Dexcom G7 Sensor Misc Change every 10 days   digoxin 0.125 MG tablet Commonly known as: LANOXIN Take 125 mcg by mouth daily.   doxycycline 100 MG tablet Commonly known as: VIBRA-TABS Take 100 mg by mouth 2 (two) times daily.   fenofibrate 48 MG tablet Commonly known as: Tricor Take 1 tablet (48 mg total) by mouth daily.   HumaLOG KwikPen 100 UNIT/ML KwikPen Generic drug: insulin lispro INJECT 10 UNITS UNDER THE SKIN AT BREAKFAST, 20 TO 21 UNITS WITH LUNCH, AND 10 TO 12 UNITS WITH DINNER   irbesartan 75 MG tablet Commonly known as: AVAPRO Take 75 mg by mouth daily.   Jardiance 10 MG Tabs tablet Generic drug: empagliflozin Take 10 mg by mouth daily.   pantoprazole 40 MG tablet Commonly known as: Protonix Take 1 tablet (40 mg total) by mouth daily.   simvastatin 40 MG tablet Commonly known as: ZOCOR Take 1 tablet (40 mg total) by mouth daily.   Evaristo Bury FlexTouch 200 UNIT/ML FlexTouch Pen Generic drug: insulin degludec Inject 90 Units into the skin daily.   Trulicity 3 MG/0.5ML Sopn Generic drug: Dulaglutide Inject 3 mg into the skin once a week. 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   Erectile dysfunction    Multifactorial. Improved with Cialis. New onset.   H/O folliculitis    Heartburn    Herpes exposure    History of herpetic exposure   Hyperlipidemia    Hypertension    variable in nature   Indigestion    Mild obesity    Multiple excoriations    multifactorial in origin   Situational stress    secondary to financial issues   Tobacco abuse     Past Surgical History:  Procedure Laterality Date   BALLOON ENTEROSCOPY  12/28/2021   Procedure: BALLOON ENTEROSCOPY;  Surgeon: Shellia Cleverly, DO;  Location: [facility];  Service: Gastroenterology;;   ENTEROSCOPY N/A 12/28/2021   Procedure: SINGLE BALLOON ENTEROSCOPY;  Surgeon: Shellia Cleverly, DO;  Location: [facility];  Service: Gastroenterology;  Laterality: N/A;   GIVENS CAPSULE STUDY N/A 12/22/2021   Procedure: GIVENS CAPSULE STUDY;  Surgeon: Vida Rigger, MD;  Location: [facility];  Service: Gastroenterology;  Laterality: N/A;   HOT HEMOSTASIS N/A 12/28/2021   Procedure: HOT HEMOSTASIS (ARGON PLASMA COAGULATION/BICAP);  Surgeon: Shellia Cleverly, DO;  Location: [facility];  Service: Gastroenterology;  Laterality: N/A;   I & D EXTREMITY Left 06/28/2014   Procedure: IRRIGATION AND DEBRIDEMENT EXTREMITY;  Surgeon: Betha Loa,  MD;  Location: [facility] OR;  Service: Orthopedics;  Laterality: Left;   INCISION AND DRAINAGE PERIRECTAL ABSCESS N/A 02/17/2014   Procedure: IRRIGATION AND DEBRIDEMENT PERIRECTAL  AND SCROTAL ABSCESS;  Surgeon: Abigail Miyamoto, MD;  Location: [facility] OR;  Service: General;  Laterality: N/A;   MINOR IRRIGATION AND DEBRIDEMENT OF WOUND Left 06/20/2015   Procedure: IRRIGATION AND DEBRIDEMENT Wound with nailbed repair;  Surgeon: Betha Loa, MD;  Location: [facility];  Service: Orthopedics;  Laterality: Left;   SUBMUCOSAL TATTOO INJECTION  12/28/2021   Procedure: SUBMUCOSAL TATTOO INJECTION;  Surgeon: Shellia Cleverly, DO;  Location: [facility];  Service: Gastroenterology;;    Family History  Problem Relation Age of Onset   Diabetes type II Mother    Hypertension Mother    Iron deficiency Mother    Diabetes Other     Social History:  reports that he has been smoking cigarettes. He has a 20.00 pack-year smoking history. He has never used smokeless tobacco. He reports that he does not currently use alcohol after a past usage of about 12.0 standard drinks of alcohol per week. He reports that he does not use drugs.   Review of Systems   Lipid history: Treated by PCP with simvastatin, has mixed hyperlipidemia Triglycerides treated with fenofibrate 48 mg and appears to be improving   Lab Results  Component Value Date   CHOL 124 09/02/2022   CHOL 146 01/25/2022   CHOL 121 11/09/2021   Lab Results  Component Value Date   HDL 31.40 (L) 09/02/2022   HDL 33.30 (L) 01/25/2022   HDL 23.40 (L) 11/09/2021   Lab Results  Component Value Date   LDLCALC 60 09/02/2022   LDLCALC 33 08/05/2021   LDLCALC 96 09/23/2013   Lab Results  Component Value Date   TRIG 163.0 (H) 09/02/2022   TRIG 213.0 (H) 01/25/2022   TRIG 373.0 (H) 11/09/2021   Lab Results  Component Value Date   CHOLHDL 4 09/02/2022   CHOLHDL 4 01/25/2022   CHOLHDL 5 11/09/2021   Lab Results  Component Value Date    LDLDIRECT 76.0 01/25/2022   LDLDIRECT 39.0 11/09/2021   LDLDIRECT 43.0 05/11/2021  Hypertension: Has been treated with carvedilol   Followed by cardiologist Dr. Sharyn Lull   BP Readings from Last 3 Encounters:  09/07/22 (!) 150/80  08/02/22 (!) 150/81  07/26/22 (!) 151/83     Right foot swelling: Has Charcot foot followed by orthopedic surgeon  Currently known complications of diabetes: Erectile dysfunction, minimal neuropathy, microalbuminuria  CKD:   followed by nephrologist, creatinine levels as follows, most recent creatinine :  Lab Results  Component Value Date   CREATININE 1.79 (H) 09/02/2022   CREATININE 2.21 (H) 03/19/2022   CREATININE 1.70 (H) 01/25/2022   HYPERKALEMIA: Resolved and restricting high potassium foods and drinks  Lab Results  Component Value Date   K 4.6 09/02/2022    HYPONATREMIA: His sodium has been previously low but asymptomatic  This is almost consistently near normal recently   Lab Results  Component Value Date   NA 134 (L) 09/02/2022   K 4.6 09/02/2022   CL 101 09/02/2022   CO2 26 09/02/2022   Lab Results  Component Value Date   TSH 3.81 10/23/2020    LABS:  Lab on 09/02/2022  Component Date Value Ref Range Status   Cholesterol 09/02/2022 124  0 - 200 mg/dL Final   ATP III Classification       Desirable:  < 200 mg/dL               Borderline High:  200 - 239 mg/dL          High:  > = 161 mg/dL   Triglycerides 09/60/4540 163.0 (H)  0.0 - 149.0 mg/dL Final   Normal:  <981 mg/dLBorderline High:  150 - 199 mg/dL   HDL 19/14/7829 56.21 (L)  >30.86 mg/dL Final   VLDL 57/84/6962 32.6  0.0 - 40.0 mg/dL Final   LDL Cholesterol 09/02/2022 60  0 - 99 mg/dL Final   Total CHOL/HDL Ratio 09/02/2022 4   Final                  Men          Women1/2 Average Risk     3.4          3.3Average Risk          5.0          4.42X Average Risk          9.6          7.13X Average Risk          15.0          11.0                       NonHDL  09/02/2022 92.54   Final   NOTE:  Non-HDL goal should be 30 mg/dL higher than patient's LDL goal (i.e. LDL goal of < 70 mg/dL, would have non-HDL goal of < 100 mg/dL)   Sodium 95/28/4132 440 (L)  135 - 145 mEq/L Final   Potassium 09/02/2022 4.6  3.5 - 5.1 mEq/L Final   Chloride 09/02/2022 101  96 - 112 mEq/L Final   CO2 09/02/2022 26  19 - 32 mEq/L Final   Glucose, Bld 09/02/2022 43 Repeated and verified X2. (LL)  70 - 99 mg/dL Final   BUN 02/06/2535 26 (H)  6 - 23 mg/dL Final   Creatinine, Ser 09/02/2022 1.79 (H)  0.40 - 1.50 mg/dL Final   GFR 64/40/3474 42.56 (L)  >60.00 mL/min Final   Calculated using the CKD-EPI  Creatinine Equation (2021)   Calcium 09/02/2022 8.9  8.4 - 10.5 mg/dL Final   Hgb W0J MFr Bld 09/02/2022 7.6 (H)  4.6 - 6.5 % Final   Glycemic Control Guidelines for People with Diabetes:Non Diabetic:  <6%Goal of Therapy: <7%Additional Action Suggested:  >8%    No history of thyroid disease  Lab Results  Component Value Date   TSH 3.81 10/23/2020    Physical Examination:  BP (!) 150/80 Comment: Pt took meds 1 hour ago  Pulse 78   Ht 5\' 11"  (1.803 m)   Wt 299 lb 12.8 oz (136 kg)   SpO2 98%   BMI 41.81 kg/m   Diabetic Foot Exam - Simple   Simple Foot Form Diabetic Foot exam was performed with the following findings: Yes   Visual Inspection No deformities, no ulcerations, no other skin breakdown bilaterally: Yes Sensation Testing See comments: Yes Pulse Check Posterior Tibialis and Dorsalis pulse intact bilaterally: Yes Comments Decrease in monofilament sensation on most of the toes except left fifth toe, absent on first toes possibly from thickened skin        ASSESSMENT:  Diabetes type 2 with severe obesity on insulin  See history of present illness for discussion of current diabetes management, blood sugar patterns and problems identified  His A1c is back down to 7.6  He is on basal bolus insulin, previously on Ozempic 1 mg and now taking  Trulicity On Jardiance primarily for renal benefits  With now getting his Trulicity as a GLP-1 drug his blood sugars are significantly better in the last month or so However he is also getting some hypoglycemia with his morning and lunchtime insulin doses Overnight blood sugars are in the low 100 range and potentially may get lower also He is also concerned about the lack of weight loss However not doing much formal exercise  LIPIDS: LDL is well-controlled with slightly increased triglycerides, these are improving with better blood sugar control and may benefit from further weight loss also  PLAN:    He will go up to 3 mg on the Trulicity  He can try leaving off the Humalog in the morning when he has his coffee and only use 5 units of blood sugars go up excessively With increasing Trulicity he can reduce his lunch coverage to 12 to 16 units based on meal size Also take 10 units instead of 15 at dinnertime Likely can reduce Tresiba to 84 units instead of 90 for now and more if his blood sugars are low normal waking up If he is still having concerns about high co-pay for [place] he can switch to [employer] which appears to be the only basal insulin covered by his insurance Continue improving diet with lower fat and carbohydrate content Regular walking for exercise He will discuss switching Jardiance to [place] with his nephrologist    Patient Instructions  No Humalog in am  At lunch 12-16 units and 10   Tresiba 84 units daily      Reather Littler 09/07/2022, 11:42 AM   Note: This office note was prepared with Dragon voice recognition system technology. Any transcriptional errors that result from this process are unintentional.

## 2022-09-08 ENCOUNTER — Encounter: Payer: Self-pay | Admitting: Endocrinology

## 2022-09-13 ENCOUNTER — Telehealth: Payer: Self-pay

## 2022-09-13 MED ORDER — TRULICITY 1.5 MG/0.5ML ~~LOC~~ SOAJ: 1.5000 mg | SUBCUTANEOUS | 0 refills | Status: DC

## 2022-09-13 NOTE — Telephone Encounter (Signed)
3mg  dose not available but they have the 1.5mg  dose of Trulicity. Okay to fill

## 2022-09-13 NOTE — Telephone Encounter (Signed)
Done

## 2022-09-21 ENCOUNTER — Other Ambulatory Visit: Payer: Self-pay

## 2022-09-21 ENCOUNTER — Other Ambulatory Visit (HOSPITAL_COMMUNITY): Payer: Self-pay

## 2022-09-21 DIAGNOSIS — D500 Iron deficiency anemia secondary to blood loss (chronic): Secondary | ICD-10-CM

## 2022-09-22 ENCOUNTER — Inpatient Hospital Stay (HOSPITAL_BASED_OUTPATIENT_CLINIC_OR_DEPARTMENT_OTHER): Payer: Commercial Managed Care - HMO | Admitting: Physician Assistant

## 2022-09-22 ENCOUNTER — Other Ambulatory Visit: Payer: Self-pay

## 2022-09-22 ENCOUNTER — Inpatient Hospital Stay: Payer: Commercial Managed Care - HMO | Attending: Hematology and Oncology

## 2022-09-22 ENCOUNTER — Other Ambulatory Visit: Payer: Self-pay | Admitting: Physician Assistant

## 2022-09-22 VITALS — BP 147/79 | HR 82 | Temp 98.6°F | Resp 17 | Wt 298.9 lb

## 2022-09-22 DIAGNOSIS — F17210 Nicotine dependence, cigarettes, uncomplicated: Secondary | ICD-10-CM | POA: Insufficient documentation

## 2022-09-22 DIAGNOSIS — D500 Iron deficiency anemia secondary to blood loss (chronic): Secondary | ICD-10-CM | POA: Insufficient documentation

## 2022-09-22 DIAGNOSIS — K922 Gastrointestinal hemorrhage, unspecified: Secondary | ICD-10-CM | POA: Insufficient documentation

## 2022-09-22 DIAGNOSIS — I509 Heart failure, unspecified: Secondary | ICD-10-CM | POA: Insufficient documentation

## 2022-09-22 DIAGNOSIS — I110 Hypertensive heart disease with heart failure: Secondary | ICD-10-CM | POA: Diagnosis not present

## 2022-09-22 LAB — CBC WITH DIFFERENTIAL (CANCER CENTER ONLY)
Abs Immature Granulocytes: 0.03 10*3/uL (ref 0.00–0.07)
Basophils Absolute: 0 10*3/uL (ref 0.0–0.1)
Basophils Relative: 0 %
Eosinophils Absolute: 0.5 10*3/uL (ref 0.0–0.5)
Eosinophils Relative: 7 %
HCT: 37.2 % — ABNORMAL LOW (ref 39.0–52.0)
Hemoglobin: 12 g/dL — ABNORMAL LOW (ref 13.0–17.0)
Immature Granulocytes: 0 %
Lymphocytes Relative: 15 %
Lymphs Abs: 1.1 10*3/uL (ref 0.7–4.0)
MCH: 27.6 pg (ref 26.0–34.0)
MCHC: 32.3 g/dL (ref 30.0–36.0)
MCV: 85.7 fL (ref 80.0–100.0)
Monocytes Absolute: 0.4 10*3/uL (ref 0.1–1.0)
Monocytes Relative: 5 %
Neutro Abs: 5.4 10*3/uL (ref 1.7–7.7)
Neutrophils Relative %: 73 %
Platelet Count: 334 10*3/uL (ref 150–400)
RBC: 4.34 MIL/uL (ref 4.22–5.81)
RDW: 15.9 % — ABNORMAL HIGH (ref 11.5–15.5)
WBC Count: 7.5 10*3/uL (ref 4.0–10.5)
nRBC: 0 % (ref 0.0–0.2)

## 2022-09-22 LAB — CMP (CANCER CENTER ONLY)
ALT: 13 U/L (ref 0–44)
AST: 14 U/L — ABNORMAL LOW (ref 15–41)
Albumin: 3.1 g/dL — ABNORMAL LOW (ref 3.5–5.0)
Alkaline Phosphatase: 121 U/L (ref 38–126)
Anion gap: 5 (ref 5–15)
BUN: 24 mg/dL — ABNORMAL HIGH (ref 6–20)
CO2: 27 mmol/L (ref 22–32)
Calcium: 8.5 mg/dL — ABNORMAL LOW (ref 8.9–10.3)
Chloride: 102 mmol/L (ref 98–111)
Creatinine: 1.57 mg/dL — ABNORMAL HIGH (ref 0.61–1.24)
GFR, Estimated: 52 mL/min — ABNORMAL LOW (ref >60)
Glucose, Bld: 83 mg/dL (ref 70–99)
Potassium: 4.5 mmol/L (ref 3.5–5.1)
Sodium: 134 mmol/L — ABNORMAL LOW (ref 135–145)
Total Bilirubin: 0.6 mg/dL (ref 0.3–1.2)
Total Protein: 7.8 g/dL (ref 6.5–8.1)

## 2022-09-22 LAB — IRON AND IRON BINDING CAPACITY (CC-WL,HP ONLY)
Iron: 54 ug/dL (ref 45–182)
Saturation Ratios: 27 % (ref 17.9–39.5)
TIBC: 200 ug/dL — ABNORMAL LOW (ref 250–450)
UIBC: 146 ug/dL (ref 117–376)

## 2022-09-22 LAB — FERRITIN: Ferritin: 95 ng/mL (ref 24–336)

## 2022-09-22 NOTE — Progress Notes (Signed)
[facility] [phone]   [phone]  PROGRESS NOTE  Patient Care Team: Gwenyth Bender, MD as PCP - General (Internal Medicine)  Hematological/Oncological History 12/21/2021-12/23/2021: Admitted due to acute iron deficiency anemia with Hgb 6.7 and hemoccult test positive. He was given 3 units of PRBC and IV ferrlecit 250 mg x 2 doses. CT scan did show mildly nodular hepatic contour concerning for cirrhosis. Capsular endoscopy showed portal gastropathy and two small bowel bleeding sites. Hgb improved to 8.0 at discharge. 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  03/19/2022: Establish care with [facility] Hematology 03/30/2022-04/26/2022: Received IV venofer 200 mg x 5 doses 07/05/2022-08/02/2022: Received IV venofer 200 mg x 5 doses  CHIEF COMPLAINTS/PURPOSE OF CONSULTATION:  Iron deficiency anemia  HISTORY OF PRESENTING ILLNESS:  Austin Alexander 52 y.o. male returns for a follow up for iron deficiency anemia. He is unaccompanied for this visit.   On exam today, Mr. Leno reports his energy levels are overall stable. He denies fatigue at this time and is able to complete all his ADLs on his own. He is compliant with taking his PO iron. He denies easy bruising or signs of active bleeding. He denies fevers, chills, night sweats, shortness of breath, chest pain, cough, nausea, vomiting, diarrhea or constipation. He has no other complaints. Rest of the 10 point ROS is below.   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   Erectile dysfunction    Multifactorial. Improved with Cialis. New onset.   H/O folliculitis    Heartburn    Herpes exposure    History of herpetic exposure   Hyperlipidemia    Hypertension    variable in nature   Indigestion    Mild obesity    Multiple excoriations    multifactorial in origin   Situational stress    secondary to financial issues   Tobacco abuse     SURGICAL HISTORY: Past Surgical History:  Procedure Laterality Date   BALLOON ENTEROSCOPY  12/28/2021   Procedure: BALLOON ENTEROSCOPY;  Surgeon: Shellia Cleverly, DO;  Location: [facility] ENDOSCOPY;  Service: Gastroenterology;;   ENTEROSCOPY N/A 12/28/2021   Procedure: SINGLE BALLOON ENTEROSCOPY;  Surgeon: Shellia Cleverly, DO;  Location: [facility] ENDOSCOPY;  Service: Gastroenterology;  Laterality: N/A;   GIVENS CAPSULE STUDY N/A 12/22/2021   Procedure: GIVENS CAPSULE STUDY;  Surgeon: Vida Rigger, MD;  Location: [facility] ENDOSCOPY;  Service: Gastroenterology;  Laterality: N/A;   HOT HEMOSTASIS N/A 12/28/2021   Procedure: HOT HEMOSTASIS (ARGON PLASMA COAGULATION/BICAP);  Surgeon: Shellia Cleverly, DO;  Location: [facility] ENDOSCOPY;  Service: Gastroenterology;  Laterality: N/A;   I & D EXTREMITY Left 06/28/2014   Procedure: IRRIGATION AND DEBRIDEMENT EXTREMITY;  Surgeon: Betha Loa, MD;  Location: [facility] OR;  Service: Orthopedics;  Laterality: Left;   INCISION AND DRAINAGE PERIRECTAL ABSCESS N/A 02/17/2014   Procedure: IRRIGATION AND DEBRIDEMENT PERIRECTAL  AND SCROTAL ABSCESS;  Surgeon: Abigail Miyamoto, MD;  Location: [facility] OR;  Service: General;  Laterality: N/A;   MINOR IRRIGATION AND DEBRIDEMENT OF WOUND Left 06/20/2015   Procedure: IRRIGATION AND DEBRIDEMENT Wound with nailbed repair;  Surgeon: Betha Loa, MD;  Location:  SURGERY CENTER;  Service:  Orthopedics;  Laterality: Left;   SUBMUCOSAL TATTOO INJECTION  12/28/2021   Procedure: SUBMUCOSAL TATTOO INJECTION;  Surgeon: Shellia Cleverly, DO;  Location: [facility] ENDOSCOPY;  Service: Gastroenterology;;    SOCIAL HISTORY: Social History   Socioeconomic History   Marital status: Single    Spouse name: Not on file   Number of children: Not on file   Years of education: Not on file   Highest education level: Not on file  Occupational History   Not on file  Tobacco Use   Smoking status: Every Day    Packs/day: 1.00    Years: 20.00    Additional pack years: 0.00    Total pack years: 20.00    Types: Cigarettes   Smokeless tobacco: Never  Vaping Use   Vaping Use: Never used  Substance and Sexual Activity   Alcohol use: Not Currently    Alcohol/week: 12.0 standard drinks of alcohol    Types: 10 Cans of beer, 2 Shots of liquor per week    Comment: As of 04/06/14 - states occasional use   Drug use: No   Sexual activity: Yes  Other Topics Concern   Not on file  Social History Narrative   Not on file   Social Determinants of Health   Financial Resource Strain: Not on file  Food Insecurity: No Food Insecurity (12/22/2021)   Hunger Vital Sign    Worried About Running Out of Food in the Last Year: Never true    Ran Out of Food in the Last Year: Never true  Transportation Needs: No Transportation Needs (12/22/2021)   PRAPARE - Administrator, Civil Service (Medical): No    Lack of Transportation (Non-Medical): No  Physical Activity: Not on file  Stress: Not on file  Social Connections: Not on file  Intimate Partner Violence: Not At Risk (12/22/2021)   Humiliation, Afraid, Rape, and Kick questionnaire    Fear of Current or Ex-Partner: No    Emotionally Abused: No    Physically Abused: No    Sexually Abused: No    FAMILY HISTORY: Family History  Problem Relation Age of Onset   Diabetes type II Mother    Hypertension Mother    Iron deficiency Mother    Diabetes  Other     ALLERGIES:  has No Known Allergies.  MEDICATIONS:  Current Outpatient Medications  Medication Sig Dispense Refill   B-D ULTRAFINE III SHORT PEN 31G X 8 MM MISC SMARTSIG:Pre-Filled Pen Syringe SUB-Q Daily     blood glucose meter kit and supplies Dispense based on patient and insurance preference. Use up to four times daily as directed. (FOR ICD-9 250.00, 250.01). 1 each 0   carvedilol (COREG) 25 MG tablet Take 25 mg by mouth 2 (two) times daily with a meal.     Continuous Blood Gluc Receiver (DEXCOM G7 RECEIVER) DEVI USE TO CHECK BLOOD SUGAR DAILY 1 each 0   Continuous Glucose Sensor (DEXCOM G7 SENSOR) MISC Change every 10 days 3 each 3   digoxin (LANOXIN) 0.125 MG tablet Take 125 mcg by mouth daily.  doxycycline (VIBRA-TABS) 100 MG tablet Take 100 mg by mouth daily.     Dulaglutide (TRULICITY) 1.5 MG/0.5ML SOPN Inject 1.5 mg into the skin once a week. 6 mL 0   fenofibrate (TRICOR) 48 MG tablet Take 1 tablet (48 mg total) by mouth daily. 90 tablet 1   HUMALOG KWIKPEN 100 UNIT/ML KwikPen INJECT 10 UNITS UNDER THE SKIN AT BREAKFAST, 20 TO 21 UNITS WITH LUNCH, AND 10 TO 12 UNITS WITH DINNER 45 mL 0   insulin degludec (TRESIBA FLEXTOUCH) 200 UNIT/ML FlexTouch Pen Inject 90 Units into the skin daily. 15 mL 2   JARDIANCE 10 MG TABS tablet Take 10 mg by mouth daily.     pantoprazole (PROTONIX) 40 MG tablet Take 1 tablet (40 mg total) by mouth daily. 30 tablet 1   simvastatin (ZOCOR) 40 MG tablet Take 1 tablet (40 mg total) by mouth daily. 30 tablet 0   Continuous Blood Gluc Transmit (DEXCOM G6 TRANSMITTER) MISC Change every 90 days (Patient not taking: Reported on 09/22/2022) 3 each 1   Dulaglutide (TRULICITY) 3 MG/0.5ML SOPN Inject 3 mg into the skin once a week. (Patient not taking: Reported on 09/22/2022) 6 mL 2   irbesartan (AVAPRO) 75 MG tablet Take 75 mg by mouth daily. (Patient not taking: Reported on 09/22/2022)     No current facility-administered medications for this visit.     REVIEW OF SYSTEMS:   Constitutional: ( - ) fevers, ( - )  chills , ( - ) night sweats Eyes: ( - ) blurriness of vision, ( - ) double vision, ( - ) watery eyes Ears, nose, mouth, throat, and face: ( - ) mucositis, ( - ) sore throat Respiratory: ( - ) cough, ( - ) dyspnea, ( - ) wheezes Cardiovascular: ( - ) palpitation, ( - ) chest discomfort, ( - ) lower extremity swelling Gastrointestinal:  ( - ) nausea, ( - ) heartburn, ( - ) change in bowel habits Skin: ( - ) abnormal skin rashes Lymphatics: ( - ) new lymphadenopathy, ( - ) easy bruising Neurological: ( - ) numbness, ( - ) tingling, ( - ) new weaknesses Behavioral/Psych: ( - ) mood change, ( - ) new changes  All other systems were reviewed with the patient and are negative.  PHYSICAL EXAMINATION: ECOG PERFORMANCE STATUS: 0 - Asymptomatic  Vitals:   09/22/22 1500  BP: (!) 147/79  Pulse: 82  Resp: 17  Temp: 98.6 F (37 C)  SpO2: 97%   Filed Weights   09/22/22 1500  Weight: 298 lb 14.4 oz (135.6 kg)    GENERAL: well appearing male in NAD  SKIN: skin color, texture, turgor are normal, no rashes or significant lesions EYES: conjunctiva are pink and non-injected, sclera clear LUNGS: clear to auscultation and percussion with normal breathing effort HEART: regular rate & rhythm and no murmurs and no lower extremity edema Musculoskeletal: no cyanosis of digits and no clubbing  PSYCH: alert & oriented x 3, fluent speech NEURO: no focal motor/sensory deficits  LABORATORY DATA:  I have reviewed the data as listed    Latest Ref Rng & Units 09/22/2022    2:26 PM 06/25/2022    2:35 PM 03/19/2022   10:04 AM  CBC  WBC 4.0 - 10.5 K/uL 7.5  8.2  7.9   Hemoglobin 13.0 - 17.0 g/dL 16.1  09.6  04.5   Hematocrit 39.0 - 52.0 % 37.2  35.3  33.0   Platelets 150 - 400 K/uL 334  342  522        Latest Ref Rng & Units 09/02/2022   11:09 AM 05/31/2022    9:58 AM 03/19/2022   10:04 AM  CMP  Glucose 70 - 99 mg/dL 43 Repeated and  verified X2.  192  128   BUN 6 - 23 mg/dL 26   33   Creatinine 1.61 - 1.50 mg/dL 0.96   0.45   Sodium 409 - 145 mEq/L 134   133   Potassium 3.5 - 5.1 mEq/L 4.6   5.4   Chloride 96 - 112 mEq/L 101   104   CO2 19 - 32 mEq/L 26   25   Calcium 8.4 - 10.5 mg/dL 8.9   9.1   Total Protein 6.5 - 8.1 g/dL   8.4   Total Bilirubin 0.3 - 1.2 mg/dL   0.3   Alkaline Phos 38 - 126 U/L   128   AST 15 - 41 U/L   9   ALT 0 - 44 U/L   10     ASSESSMENT & PLAN Avner Stroder is a 52 y.o. male returns for a follow up for iron deficiency anemia.   #Iron deficiency anemia 2/2 GI bleed: --Underwent capsular endoscopy on 12/22/2021 that showed probable portal gastropathy and two small bowel bleeding sites. 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  --Patient is under the care of Dr. Levora Angel, last seen on 07/26/2022.  --Last received IV venofer 200 mg x 5 doses from 07/05/2022-08/02/2022  --Currently on ferrous sulfate 325 mg once daily.  --Labs today show improvement of anemia with Hgb of 12.0, MCV 85.7. Iron panel shows iron 54, saturation 27%, ferritin 95. --No need for IV iron at this time. Continue on ferrous sulfate 325 mg once daily.  --RTC in 3 months for labs only and 6 months for labs/follow up.   #Hypertension:  --Patient is on carvedilol 25 mg BID --Encouraged smoking cessation --Patient is asymptomatic without any headaches, dizziness, etc --Advised to monitor BP at home daily and follow up with PCP    No orders of the defined types were placed in this encounter.   All questions were answered. The patient knows to call the clinic with any problems, questions or concerns.  I have spent a total of 25 minutes minutes of face-to-face and non-face-to-face time, preparing to see the patient, performing a medically appropriate examination, counseling and educating the patient, ordering medications,documenting clinical information in the  electronic health record,  and care coordination.   Georga Kaufmann, PA-C Department of Hematology/Oncology [facility] Phone: [phone]

## 2022-09-23 ENCOUNTER — Telehealth: Payer: Self-pay

## 2022-09-23 ENCOUNTER — Telehealth: Payer: Self-pay | Admitting: Physician Assistant

## 2022-09-23 ENCOUNTER — Encounter: Payer: Self-pay | Admitting: Physician Assistant

## 2022-09-23 NOTE — Telephone Encounter (Signed)
Pt advised with VU 

## 2022-09-23 NOTE — Telephone Encounter (Signed)
-----   Message from Briant Cedar, PA-C sent at 09/23/2022  1:07 PM EDT ----- Please notify patient that iron levels are normal.  ----- Message ----- From: Interface, Lab In [facility] Sent: 09/22/2022   2:38 PM EDT To: Briant Cedar, PA-C

## 2022-10-08 ENCOUNTER — Ambulatory Visit
Admission: RE | Admit: 2022-10-08 | Discharge: 2022-10-08 | Disposition: A | Discharge status: Home / Self Care | Payer: Commercial Managed Care - HMO | Source: Ambulatory Visit | Attending: Gastroenterology | Admitting: Gastroenterology

## 2022-10-08 DIAGNOSIS — R9389 Abnormal findings on diagnostic imaging of other specified body structures: Secondary | ICD-10-CM

## 2022-10-11 ENCOUNTER — Other Ambulatory Visit (HOSPITAL_COMMUNITY): Payer: Self-pay

## 2022-11-25 ENCOUNTER — Other Ambulatory Visit (INDEPENDENT_AMBULATORY_CARE_PROVIDER_SITE_OTHER): Payer: Commercial Managed Care - HMO

## 2022-11-25 DIAGNOSIS — E1165 Type 2 diabetes mellitus with hyperglycemia: Secondary | ICD-10-CM | POA: Diagnosis not present

## 2022-11-25 DIAGNOSIS — Z794 Long term (current) use of insulin: Secondary | ICD-10-CM | POA: Diagnosis not present

## 2022-11-25 LAB — GLUCOSE, RANDOM: Glucose, Bld: 122 mg/dL — ABNORMAL HIGH (ref 70–99)

## 2022-11-25 LAB — HEMOGLOBIN A1C: Hgb A1c MFr Bld: 6.6 % — ABNORMAL HIGH (ref 4.6–6.5)

## 2022-11-29 ENCOUNTER — Encounter: Payer: Self-pay | Admitting: Endocrinology

## 2022-11-29 ENCOUNTER — Ambulatory Visit: Payer: Commercial Managed Care - HMO | Admitting: Endocrinology

## 2022-11-29 VITALS — BP 136/80 | HR 84 | Ht 71.0 in | Wt 300.0 lb

## 2022-11-29 DIAGNOSIS — Z794 Long term (current) use of insulin: Secondary | ICD-10-CM

## 2022-11-29 DIAGNOSIS — E1165 Type 2 diabetes mellitus with hyperglycemia: Secondary | ICD-10-CM | POA: Diagnosis not present

## 2022-11-29 MED ORDER — TRULICITY 3 MG/0.5ML ~~LOC~~ SOAJ: 3.0000 mg | SUBCUTANEOUS | 2 refills | Status: DC

## 2022-11-29 NOTE — Patient Instructions (Addendum)
10-15 at lunch of Humalog  Tresiba 84 units when going on 3mg  Trulicity

## 2022-11-29 NOTE — Progress Notes (Unsigned)
Patient ID: Austin Alexander, male   DOB: October 27, 1970, 52 y.o.   MRN: [id number]           Reason for Appointment: Follow-up for Type 2 Diabetes   History of Present Illness:          Date of diagnosis of type 2 diabetes mellitus:  2009      Background history:   He is not clear when he was first diagnosed with diabetes Had been previously taking metformin and also for a few years Amaryl and Actos Insulin probably started in 2015 according to records and his A1c has been as high as 15.2 in the past He thinks Trulicity was added about a year prior to his initial visit  Recent history:    INSULIN regimen is: [place] 90 units daily, Humalog 0-10 units in am, 15-20 units at lunch and 0-15 dinner   Non-insulin hypoglycemic drugs the patient is taking are: Farxiga 10 mg daily Trulicity 1.5 mg weekly  His A1c is 7.6, previously 8.1   Current management, blood sugar patterns and problems identified:  He is still concerned about the cost of [place] but considering the higher dose that he is taking the only other alternative is Hospital doctor He was supposed to go up to 3 mg Trulicity but he did not also because apparently it was not available at the pharmacy and continues on the 1.5 mg Although he has not lost his weight his blood sugars continue to be very well-controlled He says he is cut back 5 units on his lunchtime coverage and usually not taking much at breakfast/morning or dinner if at all More recently appears to be getting lower readings after lunch including some hypoglycemia even with the lower doses of Humalog He has difficulty doing much exercise He is now taking [place] instead of Jardiance which was more expensive  Side effects from medications have been: None  Typical meal intake: Breakfast is frequently skipped, lunch is a larger meal        Interpretation of the [employer] .  Overall blood sugars are fairly steady on an average within the target range with lowest  readings mid afternoon and highest readings 3 AM Compared to the last visit he has similar time in range on the more hypoglycemia POSTPRANDIAL hyperglycemia seen only sporadically late morning or evening Postprandial blood sugars recently are tending to be low normal or low after lunch Also some tendency to hypoglycemia before dinnertime Overall variability in blood sugars is present more in the first week compared to the second week  CGM use % of time 97  2-week average/GV 123/28  Time in range        % 90  % Time Above 180 6  % Time above 250   % Time Below 70 4   Previous:  CGM use % of time   2-week average/GV 121  Time in range    91    %  % Time Above 180 3  % Time above 250 6  % Time Below 70    Dietician visit, most recent: years ago  Weight history:  Wt Readings from Last 3 Encounters:  11/29/22 300 lb (136.1 kg)  09/22/22 298 lb 14.4 oz (135.6 kg)  09/07/22 299 lb 12.8 oz (136 kg)    Glycemic control:   Lab Results  Component Value Date   HGBA1C 6.6 (H) 11/25/2022   HGBA1C 7.6 (H) 09/02/2022   HGBA1C 8.1 (H) 05/31/2022   Lab Results  Component Value Date   MICROALBUR 51.4 (H) 07/23/2020   LDLCALC 60 09/02/2022   CREATININE 1.57 (H) 09/22/2022   Lab Results  Component Value Date   MICRALBCREAT 110.7 (H) 07/23/2020    Lab Results  Component Value Date   FRUCTOSAMINE 249 01/25/2022   FRUCTOSAMINE 217 02/19/2020   FRUCTOSAMINE 346 (H) 07/20/2019    Lab on 11/25/2022  Component Date Value Ref Range Status   Glucose, Bld 11/25/2022 122 (H)  70 - 99 mg/dL Final   Hgb V2Z MFr Bld 11/25/2022 6.6 (H)  4.6 - 6.5 % Final   Glycemic Control Guidelines for People with Diabetes:Non Diabetic:  <6%Goal of Therapy: <7%Additional Action Suggested:  >8%     Allergies as of 11/29/2022   No Known Allergies      Medication List        Accurate as of November 29, 2022 11:59 PM. If you have any questions, ask your nurse or doctor.          STOP taking  these medications    Jardiance 10 MG Tabs tablet Generic drug: empagliflozin Stopped by: Reather Littler       TAKE these medications    B-D ULTRAFINE III SHORT PEN 31G X 8 MM Misc Generic drug: Insulin Pen Needle SMARTSIG:Pre-Filled Pen Syringe SUB-Q Daily   blood glucose meter kit and supplies Dispense based on patient and insurance preference. Use up to four times daily as directed. (FOR ICD-9 250.00, 250.01).   carvedilol 25 MG tablet Commonly known as: COREG Take 25 mg by mouth 2 (two) times daily with a meal.   Dexcom G6 Transmitter Misc Change every 90 days   Dexcom G7 Receiver Devi USE TO CHECK BLOOD SUGAR DAILY   Dexcom G7 Sensor Misc Change every 10 days   digoxin 0.125 MG tablet Commonly known as: LANOXIN Take 125 mcg by mouth daily.   doxycycline 100 MG tablet Commonly known as: VIBRA-TABS Take 100 mg by mouth daily.   Entresto 49-51 MG Generic drug: sacubitril-valsartan Take 1 tablet by mouth 2 (two) times daily.   Farxiga 10 MG Tabs tablet Generic drug: dapagliflozin propanediol Take by mouth daily.   fenofibrate 48 MG tablet Commonly known as: Tricor Take 1 tablet (48 mg total) by mouth daily.   HumaLOG KwikPen 100 UNIT/ML KwikPen Generic drug: insulin lispro INJECT 10 UNITS UNDER THE SKIN AT BREAKFAST, 20 TO 21 UNITS WITH LUNCH, AND 10 TO 12 UNITS WITH DINNER   pantoprazole 40 MG tablet Commonly known as: Protonix Take 1 tablet (40 mg total) by mouth daily.   simvastatin 40 MG tablet Commonly known as: ZOCOR Take 1 tablet (40 mg total) by mouth daily.   Evaristo Bury FlexTouch 200 UNIT/ML FlexTouch Pen Generic drug: insulin degludec Inject 90 Units into the skin daily.   Trulicity 3 MG/0.5ML Sopn Generic drug: Dulaglutide Inject 3 mg into the skin once a week. What changed: Another medication with the same name was removed. Continue taking this medication, and follow the directions you see here. Changed by: Reather Littler        Allergies:  No Known Allergies  Past Medical History:  Diagnosis Date   Abscess    Multiple facial abscesses, which has progressed   Abscess of perineum    Alcohol abuse    Asthma    History of,   Bronchitis    CHF (congestive heart failure) (HCC)    COPD (chronic obstructive pulmonary disease) (HCC)    moderate obstruction with low vital capacity  Croup    Cystic acne    with acne keloidosis   Diabetes mellitus without complication (HCC)    Type 2, uncontrolled with questionable improvement   Dietary noncompliance    History of,   Dizziness    EKG, abnormal    History of, with negative cardiac evaluation by history.   Erectile dysfunction    Multifactorial. Improved with Cialis. New onset.   H/O folliculitis    Heartburn    Herpes exposure    History of herpetic exposure   Hyperlipidemia    Hypertension    variable in nature   Indigestion    Mild obesity    Multiple excoriations    multifactorial in origin   Situational stress    secondary to financial issues   Tobacco abuse     Past Surgical History:  Procedure Laterality Date   BALLOON ENTEROSCOPY  12/28/2021   Procedure: BALLOON ENTEROSCOPY;  Surgeon: Shellia Cleverly, DO;  Location: [facility];  Service: Gastroenterology;;   ENTEROSCOPY N/A 12/28/2021   Procedure: SINGLE BALLOON ENTEROSCOPY;  Surgeon: Shellia Cleverly, DO;  Location: [facility];  Service: Gastroenterology;  Laterality: N/A;   GIVENS CAPSULE STUDY N/A 12/22/2021   Procedure: GIVENS CAPSULE STUDY;  Surgeon: Vida Rigger, MD;  Location: [facility]COPY;  Service: Gastroenterology;  Laterality: N/A;   HOT HEMOSTASIS N/A 12/28/2021   Procedure: HOT HEMOSTASIS (ARGON PLASMA COAGULATION/BICAP);  Surgeon: Shellia Cleverly, DO;  Location: [facility];  Service: Gastroenterology;  Laterality: N/A;   I & D EXTREMITY Left 06/28/2014   Procedure: IRRIGATION AND DEBRIDEMENT EXTREMITY;  Surgeon: Betha Loa, MD;  Location: [facility] OR;  Service: Orthopedics;  Laterality:  Left;   INCISION AND DRAINAGE PERIRECTAL ABSCESS N/A 02/17/2014   Procedure: IRRIGATION AND DEBRIDEMENT PERIRECTAL  AND SCROTAL ABSCESS;  Surgeon: Abigail Miyamoto, MD;  Location: [facility] OR;  Service: General;  Laterality: N/A;   MINOR IRRIGATION AND DEBRIDEMENT OF WOUND Left 06/20/2015   Procedure: IRRIGATION AND DEBRIDEMENT Wound with nailbed repair;  Surgeon: Betha Loa, MD;  Location: [facility];  Service: Orthopedics;  Laterality: Left;   SUBMUCOSAL TATTOO INJECTION  12/28/2021   Procedure: SUBMUCOSAL TATTOO INJECTION;  Surgeon: Shellia Cleverly, DO;  Location: [facility];  Service: Gastroenterology;;    Family History  Problem Relation Age of Onset   Diabetes type II Mother    Hypertension Mother    Iron deficiency Mother    Diabetes Other     Social History:  reports that he has been smoking cigarettes. He has a 20 pack-year smoking history. He has never used smokeless tobacco. He reports that he does not currently use alcohol after a past usage of about 12.0 standard drinks of alcohol per week. He reports that he does not use drugs.   Review of Systems   Lipid history: Treated by PCP with simvastatin, has mixed hyperlipidemia Triglycerides treated with fenofibrate 48 mg and appears to be improving   Lab Results  Component Value Date   CHOL 124 09/02/2022   CHOL 146 01/25/2022   CHOL 121 11/09/2021   Lab Results  Component Value Date   HDL 31.40 (L) 09/02/2022   HDL 33.30 (L) 01/25/2022   HDL 23.40 (L) 11/09/2021   Lab Results  Component Value Date   LDLCALC 60 09/02/2022   LDLCALC 33 08/05/2021   LDLCALC 96 09/23/2013   Lab Results  Component Value Date   TRIG 163.0 (H) 09/02/2022   TRIG 213.0 (H) 01/25/2022   TRIG 373.0 (H)  11/09/2021   Lab Results  Component Value Date   CHOLHDL 4 09/02/2022   CHOLHDL 4 01/25/2022   CHOLHDL 5 11/09/2021   Lab Results  Component Value Date   LDLDIRECT 76.0 01/25/2022   LDLDIRECT 39.0 11/09/2021    LDLDIRECT 43.0 05/11/2021            Hypertension: Has been treated with carvedilol   Followed by cardiologist Dr. Sharyn Lull  BP Readings from Last 3 Encounters:  11/29/22 136/80  09/22/22 (!) 147/79  09/07/22 (!) 140/80    Right foot swelling: Has Charcot foot followed by orthopedic surgeon  Currently known complications of diabetes: Erectile dysfunction, minimal neuropathy, microalbuminuria  CKD:   followed by nephrologist, creatinine levels as follows, most recent creatinine :  Lab Results  Component Value Date   CREATININE 1.57 (H) 09/22/2022   CREATININE 1.79 (H) 09/02/2022   CREATININE 2.21 (H) 03/19/2022   HYPERKALEMIA: Resolved and restricting high potassium foods and drinks  Lab Results  Component Value Date   K 4.5 09/22/2022    HYPONATREMIA: His sodium has been previously low but asymptomatic  This is now nearly normal   Lab Results  Component Value Date   NA 134 (L) 09/22/2022   K 4.5 09/22/2022   CL 102 09/22/2022   CO2 27 09/22/2022    LABS:  Lab on 11/25/2022  Component Date Value Ref Range Status   Glucose, Bld 11/25/2022 122 (H)  70 - 99 mg/dL Final   Hgb N8G MFr Bld 11/25/2022 6.6 (H)  4.6 - 6.5 % Final   Glycemic Control Guidelines for People with Diabetes:Non Diabetic:  <6%Goal of Therapy: <7%Additional Action Suggested:  >8%     Physical Examination:  BP 136/80 (BP Location: Left Arm, Patient Position: Sitting, Cuff Size: Large)   Pulse 84   Ht 5\' 11"  (1.803 m)   Wt 300 lb (136.1 kg)   SpO2 98%   BMI 41.84 kg/m       ASSESSMENT:  Diabetes type 2 with severe obesity on insulin  See history of present illness for discussion of current diabetes management, blood sugar patterns and problems identified  His A1c is back down to 6.6  He is on basal bolus insulin, Trulicity 1.5 mg weekly On Farxiga primarily for renal benefits  He does benefit from a GLP-1 drug for the diabetes control Requiring less mealtime insulin  also However not losing weight Is not able to do much physical exercise because of limitations Has been consistent with using his Dexcom to monitor his blood sugars  Hypertension: Blood pressure is well-controlled   PLAN:    He will go up to 3 mg on the Trulicity this should be available now He can try reducing his lunchtime dose to 10 units and take insulin coverage for dinner only if eating larger carbohydrate meal Continue Marcelline Deist  Encouraged him to be as active as possible for weight loss Likely can reduce Tresiba to 84 units when going to 3 mg of Tresiba     Patient Instructions  10-15 at lunch of Humalog  Tresiba 84 units when going on 3mg  Trulicity     Reather Littler 11/30/2022, 2:12 PM   Note: This office note was prepared with Dragon voice recognition system technology. Any transcriptional errors that result from this process are unintentional.

## 2022-11-30 ENCOUNTER — Encounter: Payer: Self-pay | Admitting: Endocrinology

## 2022-12-06 ENCOUNTER — Other Ambulatory Visit: Payer: Self-pay | Admitting: Endocrinology

## 2022-12-07 ENCOUNTER — Telehealth: Payer: Self-pay

## 2022-12-07 ENCOUNTER — Telehealth: Payer: Self-pay | Admitting: Endocrinology

## 2022-12-07 ENCOUNTER — Other Ambulatory Visit (HOSPITAL_COMMUNITY): Payer: Self-pay

## 2022-12-07 NOTE — Telephone Encounter (Signed)
Pharmacy Patient Advocate Encounter   Received notification from Pt Calls Messages that prior authorization for Trulicity is required/requested.   Insurance verification completed.   The patient is insured through [employer] .   Per test claim: PA required; PA submitted to [employer] via CoverMyMeds Key/confirmation #/EOC [id number] Status is pending

## 2022-12-07 NOTE — Telephone Encounter (Signed)
Patient came in to office and stated that the  pharmacy, [facility], has told him that insurance is requiring a prior authorization for the higher dose of Trulicity.  Patient is supposed to take an injection tomorrow (Wednesday, 12/08/2022) and does not have any for tomorrow. Patient wants to know what he needs to do to get medication.

## 2022-12-07 NOTE — Telephone Encounter (Signed)
Spoke with pt informed that will put his medication you to PA team for [employer] and if we don't hear any from them soon, I will put a message to Dr Zachery Dakins to see if we can change the his medication until we are able to get his insulin approved.

## 2022-12-07 NOTE — Telephone Encounter (Signed)
Called and spoke with patient to let him know that the PA for his Truicity is still pending , once we receive a response we will contact him asap.

## 2022-12-07 NOTE — Telephone Encounter (Signed)
Pt stated that  he spoke with the [facility] and was told that he need PA on his Trulicity he said that he need to give his next injection wed.

## 2022-12-08 NOTE — Telephone Encounter (Signed)
Patient aware and will wait on prior authorization

## 2022-12-09 ENCOUNTER — Other Ambulatory Visit: Payer: Self-pay

## 2022-12-09 MED ORDER — TRULICITY 3 MG/0.5ML ~~LOC~~ SOAJ: 3.0000 mg | SUBCUTANEOUS | 0 refills | Status: DC

## 2022-12-14 NOTE — Telephone Encounter (Signed)
Pharmacy Patient Advocate Encounter  Received notification from [employer] that Prior Authorization for Trulicity has been APPROVED through 12/06/2023   PA #/Case ID/Reference #: [id number]

## 2022-12-14 NOTE — Telephone Encounter (Signed)
Patient aware.

## 2022-12-15 ENCOUNTER — Inpatient Hospital Stay: Payer: Commercial Managed Care - HMO | Attending: Hematology and Oncology

## 2022-12-15 ENCOUNTER — Inpatient Hospital Stay: Payer: Commercial Managed Care - HMO

## 2022-12-23 ENCOUNTER — Other Ambulatory Visit: Payer: Self-pay

## 2022-12-23 DIAGNOSIS — E1165 Type 2 diabetes mellitus with hyperglycemia: Secondary | ICD-10-CM

## 2022-12-23 MED ORDER — DEXCOM G7 SENSOR MISC: 3 refills | Status: DC

## 2022-12-24 ENCOUNTER — Other Ambulatory Visit: Payer: Self-pay

## 2022-12-24 DIAGNOSIS — E1165 Type 2 diabetes mellitus with hyperglycemia: Secondary | ICD-10-CM

## 2022-12-24 MED ORDER — DEXCOM G7 SENSOR MISC
3 refills | Status: DC
Start: 2022-12-24 — End: 2023-03-09

## 2022-12-24 MED ORDER — TRESIBA FLEXTOUCH 200 UNIT/ML ~~LOC~~ SOPN: 90.0000 [IU] | PEN_INJECTOR | Freq: Every day | SUBCUTANEOUS | 2 refills | Status: DC

## 2023-01-03 ENCOUNTER — Encounter: Payer: Self-pay | Admitting: Internal Medicine

## 2023-01-07 ENCOUNTER — Other Ambulatory Visit: Payer: Self-pay

## 2023-01-07 DIAGNOSIS — Z794 Long term (current) use of insulin: Secondary | ICD-10-CM

## 2023-01-07 MED ORDER — TRULICITY 3 MG/0.5ML ~~LOC~~ SOAJ: 3.0000 mg | SUBCUTANEOUS | 0 refills | Status: DC

## 2023-01-07 MED ORDER — TRULICITY 3 MG/0.5ML ~~LOC~~ SOAJ
3.0000 mg | SUBCUTANEOUS | 0 refills | Status: DC
Start: 2023-01-07 — End: 2023-04-01

## 2023-03-08 ENCOUNTER — Other Ambulatory Visit (HOSPITAL_COMMUNITY): Payer: Self-pay

## 2023-03-09 ENCOUNTER — Other Ambulatory Visit: Payer: Self-pay

## 2023-03-09 DIAGNOSIS — E1165 Type 2 diabetes mellitus with hyperglycemia: Secondary | ICD-10-CM

## 2023-03-09 MED ORDER — DEXCOM G7 SENSOR MISC
3 refills | Status: DC
Start: 2023-03-09 — End: 2023-12-08

## 2023-03-09 NOTE — Telephone Encounter (Signed)
Dexcom G7 sensor refill request complete

## 2023-03-16 ENCOUNTER — Inpatient Hospital Stay: Payer: Commercial Managed Care - HMO | Admitting: Nurse Practitioner

## 2023-03-16 ENCOUNTER — Inpatient Hospital Stay: Payer: Commercial Managed Care - HMO | Attending: Hematology and Oncology

## 2023-03-16 VITALS — BP 120/78 | HR 86 | Temp 98.2°F | Resp 17 | Wt 303.0 lb

## 2023-03-16 DIAGNOSIS — D500 Iron deficiency anemia secondary to blood loss (chronic): Secondary | ICD-10-CM | POA: Insufficient documentation

## 2023-03-16 DIAGNOSIS — I130 Hypertensive heart and chronic kidney disease with heart failure and stage 1 through stage 4 chronic kidney disease, or unspecified chronic kidney disease: Secondary | ICD-10-CM | POA: Insufficient documentation

## 2023-03-16 DIAGNOSIS — E1122 Type 2 diabetes mellitus with diabetic chronic kidney disease: Secondary | ICD-10-CM | POA: Insufficient documentation

## 2023-03-16 DIAGNOSIS — N189 Chronic kidney disease, unspecified: Secondary | ICD-10-CM | POA: Insufficient documentation

## 2023-03-16 DIAGNOSIS — D631 Anemia in chronic kidney disease: Secondary | ICD-10-CM | POA: Diagnosis not present

## 2023-03-16 DIAGNOSIS — I509 Heart failure, unspecified: Secondary | ICD-10-CM | POA: Diagnosis not present

## 2023-03-16 LAB — CBC WITH DIFFERENTIAL (CANCER CENTER ONLY)
Abs Immature Granulocytes: 0.04 10*3/uL (ref 0.00–0.07)
Basophils Absolute: 0 10*3/uL (ref 0.0–0.1)
Basophils Relative: 0 %
Eosinophils Absolute: 0.6 10*3/uL — ABNORMAL HIGH (ref 0.0–0.5)
Eosinophils Relative: 6 %
HCT: 28.8 % — ABNORMAL LOW (ref 39.0–52.0)
Hemoglobin: 9 g/dL — ABNORMAL LOW (ref 13.0–17.0)
Immature Granulocytes: 0 %
Lymphocytes Relative: 13 %
Lymphs Abs: 1.2 10*3/uL (ref 0.7–4.0)
MCH: 25.4 pg — ABNORMAL LOW (ref 26.0–34.0)
MCHC: 31.3 g/dL (ref 30.0–36.0)
MCV: 81.1 fL (ref 80.0–100.0)
Monocytes Absolute: 0.4 10*3/uL (ref 0.1–1.0)
Monocytes Relative: 4 %
Neutro Abs: 7.2 10*3/uL (ref 1.7–7.7)
Neutrophils Relative %: 77 %
Platelet Count: 391 10*3/uL (ref 150–400)
RBC: 3.55 MIL/uL — ABNORMAL LOW (ref 4.22–5.81)
RDW: 16.7 % — ABNORMAL HIGH (ref 11.5–15.5)
WBC Count: 9.5 10*3/uL (ref 4.0–10.5)
nRBC: 0 % (ref 0.0–0.2)

## 2023-03-16 LAB — FERRITIN: Ferritin: 33 ng/mL (ref 24–336)

## 2023-03-16 LAB — IRON AND IRON BINDING CAPACITY (CC-WL,HP ONLY)
Iron: 39 ug/dL — ABNORMAL LOW (ref 45–182)
Saturation Ratios: 16 % — ABNORMAL LOW (ref 17.9–39.5)
TIBC: 246 ug/dL — ABNORMAL LOW (ref 250–450)
UIBC: 207 ug/dL (ref 117–376)

## 2023-03-16 NOTE — Progress Notes (Signed)
Patient Care Team: Austin Bender, MD as PCP - General (Internal Medicine)  Date of Service: 03/16/2023  CHIEF COMPLAINT: Follow up IDA  CURRENT THERAPY: Oral iron and IV Iron (Venofer) PRN - last given 200 mg over 07/05/22 - 08/02/22 in 5 doses   INTERVAL HISTORY Austin Alexander returns for follow up as scheduled. Last seen by Austin Kaufmann, PA 09/22/22. He ran out of of oral iron a few weeks ago but restarted yesterday, tolerates well.  Denies obvious bleeding or black or stools.  Energy level is stable.  He feels that his other medical conditions are controlled.  Was told he has HS, no recent flares.   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   Erectile dysfunction    Multifactorial. Improved with Cialis. New onset.   H/O folliculitis    Heartburn    Herpes exposure    History of herpetic exposure   Hyperlipidemia    Hypertension    variable in nature   Indigestion    Mild obesity    Multiple excoriations    multifactorial in origin   Situational stress    secondary to financial issues   Tobacco abuse      Past Surgical History:  Procedure Laterality Date   BALLOON ENTEROSCOPY  12/28/2021   Procedure: BALLOON ENTEROSCOPY;  Surgeon: Shellia Cleverly, DO;  Location: [facility];  Service: Gastroenterology;;   ENTEROSCOPY N/A 12/28/2021   Procedure: SINGLE BALLOON  ENTEROSCOPY;  Surgeon: Shellia Cleverly, DO;  Location: [facility];  Service: Gastroenterology;  Laterality: N/A;   GIVENS CAPSULE STUDY N/A 12/22/2021   Procedure: GIVENS CAPSULE STUDY;  Surgeon: Vida Rigger, MD;  Location: [facility] ENDOSCOPY;  Service: Gastroenterology;  Laterality: N/A;   HOT HEMOSTASIS N/A 12/28/2021   Procedure: HOT HEMOSTASIS (ARGON PLASMA COAGULATION/BICAP);  Surgeon: Shellia Cleverly, DO;  Location: [facility];  Service: Gastroenterology;  Laterality: N/A;   I & D EXTREMITY Left 06/28/2014   Procedure: IRRIGATION AND DEBRIDEMENT EXTREMITY;  Surgeon: Betha Loa, MD;  Location: [facility];  Service: Orthopedics;  Laterality: Left;   INCISION AND DRAINAGE PERIRECTAL ABSCESS N/A 02/17/2014   Procedure: IRRIGATION AND DEBRIDEMENT PERIRECTAL  AND SCROTAL ABSCESS;  Surgeon: Abigail Miyamoto, MD;  Location: [facility];  Service: General;  Laterality: N/A;   MINOR IRRIGATION AND DEBRIDEMENT OF WOUND Left 06/20/2015   Procedure: IRRIGATION AND DEBRIDEMENT Wound with nailbed repair;  Surgeon: Betha Loa, MD;  Location: [facility];  Service: Orthopedics;  Laterality: Left;   SUBMUCOSAL TATTOO INJECTION  12/28/2021   Procedure: SUBMUCOSAL TATTOO INJECTION;  Surgeon: Shellia Cleverly, DO;  Location: [facility];  Service: Gastroenterology;;     Outpatient Encounter Medications as of 03/16/2023  Medication Sig   B-D ULTRAFINE III SHORT PEN  31G X 8 MM MISC SMARTSIG:Pre-Filled Pen Syringe SUB-Q Daily   blood glucose meter kit and supplies Dispense based on patient and insurance preference. Use up to four times daily as directed. (FOR ICD-9 250.00, 250.01).   carvedilol (COREG) 25 MG tablet Take 25 mg by mouth 2 (two) times daily with a meal.   Continuous Blood Gluc Receiver (DEXCOM G7 RECEIVER) DEVI USE TO CHECK BLOOD SUGAR DAILY   Continuous Blood Gluc Transmit (DEXCOM G6 TRANSMITTER) MISC Change every 90 days   Continuous Glucose Sensor (DEXCOM G7 SENSOR) MISC Change every 10  days   dapagliflozin propanediol (FARXIGA) 10 MG TABS tablet Take by mouth daily.   digoxin (LANOXIN) 0.125 MG tablet Take 125 mcg by mouth daily.   doxycycline (VIBRA-TABS) 100 MG tablet Take 100 mg by mouth daily.   Dulaglutide (TRULICITY) 3 MG/0.5ML SOPN Inject 3 mg into the skin once a week. Pharmacy Patient Advocate Encounter  Received notification from [employer] that Prior Authorization for Trulicity has been APPROVED through 12/06/2023  PA #/Case ID/Reference #: [id number]   ENTRESTO 49-51 MG Take 1 tablet by mouth 2 (two) times daily.   fenofibrate (TRICOR) 48 MG tablet Take 1 tablet (48 mg total) by mouth daily.   HUMALOG KWIKPEN 100 UNIT/ML KwikPen INJECT 10 UNITS UNDER THE SKIN AT BREAKFAST, 20 TO 21 UNITS WITH LUNCH, AND 10 TO 12 UNITS WITH DINNER   insulin degludec (TRESIBA FLEXTOUCH) 200 UNIT/ML FlexTouch Pen Inject 90 Units into the skin daily.   pantoprazole (PROTONIX) 40 MG tablet Take 1 tablet (40 mg total) by mouth daily.   simvastatin (ZOCOR) 40 MG tablet Take 1 tablet (40 mg total) by mouth daily.   No facility-administered encounter medications on file as of 03/16/2023.     Today's Vitals   03/16/23 1130 03/16/23 1134  BP: 120/78   Pulse: 86   Resp: 17   Temp: 98.2 F (36.8 C)   TempSrc: Temporal   SpO2: 95%   Weight: (!) 303 lb (137.4 kg)   PainSc:  0-No pain   Body mass index is 42.26 kg/m.   PHYSICAL EXAM GENERAL:alert, no distress and comfortable SKIN: no rash  EYES: sclera clear NECK: without mass LYMPH:  no palpable cervical or supraclavicular lymphadenopathy  LUNGS: clear with normal breathing effort HEART: regular rate & rhythm, no lower extremity edema ABDOMEN: abdomen soft, non-tender and normal bowel sounds NEURO: alert & oriented x 3 with fluent speech   CBC    Component Value Date/Time   WBC 9.5 03/16/2023 1104   WBC 11.8 (H) 12/28/2021 1258   RBC 3.55 (L) 03/16/2023 1104   HGB 9.0 (L) 03/16/2023 1104   HGB 9.5 (L) 11/20/2018 1044   HCT  28.8 (L) 03/16/2023 1104   HCT 29.3 (L) 11/20/2018 1044   PLT 391 03/16/2023 1104   PLT 437 11/20/2018 1044   MCV 81.1 03/16/2023 1104   MCV 78 (L) 11/20/2018 1044   MCH 25.4 (L) 03/16/2023 1104   MCHC 31.3 03/16/2023 1104   RDW 16.7 (H) 03/16/2023 1104   RDW 16.0 (H) 11/20/2018 1044   LYMPHSABS 1.2 03/16/2023 1104   LYMPHSABS 1.2 11/20/2018 1044   MONOABS 0.4 03/16/2023 1104   EOSABS 0.6 (H) 03/16/2023 1104   EOSABS 0.8 (H) 11/20/2018 1044   BASOSABS 0.0 03/16/2023 1104   BASOSABS 0.1 11/20/2018 1044     CMP     Component Value Date/Time   NA 134 (L) 09/22/2022 1426   NA 129 (L) 11/20/2018 1044  K 4.5 09/22/2022 1426   CL 102 09/22/2022 1426   CO2 27 09/22/2022 1426   GLUCOSE 122 (H) 11/25/2022 0859   BUN 24 (H) 09/22/2022 1426   BUN 24 11/20/2018 1044   CREATININE 1.57 (H) 09/22/2022 1426   CREATININE 1.65 (H) 05/20/2017 1454   CALCIUM 8.5 (L) 09/22/2022 1426   PROT 7.8 09/22/2022 1426   ALBUMIN 3.1 (L) 09/22/2022 1426   AST 14 (L) 09/22/2022 1426   ALT 13 09/22/2022 1426   ALKPHOS 121 09/22/2022 1426   BILITOT 0.6 09/22/2022 1426   GFRNONAA 52 (L) 09/22/2022 1426   GFRAA 52 (L) 11/20/2018 1044     ASSESSMENT & PLAN:Austin Alexander is a 52 y.o. male returns for a follow up for iron deficiency anemia.    #Iron deficiency anemia 2/2 GI bleed: -Capsule endoscopy 12/22/2021 showed probable portal gastropathy and two small bowel bleeding sites. 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  -Patient is under the care of Dr. Levora Angel -Last received IV venofer 200 mg x 5 doses from 07/05/2022-08/02/2022  -Currently on ferrous sulfate 325 mg once daily but was off for a month or so, restarted 03/15/2023. Tolerates well -Labs reviewed, his anemia is worse lately, hgb 9.0, ferritin 33, serum iron 39 and TIBC 246. I suspect while he has some degree of IDA and was recently off oral iron for 1 month or so, he also has anemia  of CKD/chronic disease, as recent Scr up to 2.45 in 12/2022 per outside lab -He reportedly has HS, so he likely also has a component of chronic infectious/inflammatory  -He just restarted oral iron, continue daily with vit C source, and separate from PPI. Recheck in 3 months, if he has persistent IDA at that time, will offer IV iron   # HTN, DM, CHF, CKD -Per PCP -Scr up to 2.45 in 12/2022 per outside labs; follow up nephrology   PLAN: -Labs reviewed -Continue oral iron with vit C source, separate from PPI -lab in 3 months, if he has persistent IDA, will offer IV iron -Co-morbidities per PCP and nephrology -F/up in 6 months, or sooner if needed  Orders Placed This Encounter  Procedures   CMP (Cancer Center only)    Standing Status:   Future    Standing Expiration Date:   03/20/2024      All questions were answered. The patient knows to call the clinic with any problems, questions or concerns. No barriers to learning were detected. I spent 20 minutes counseling the patient face to face. The total time spent in the appointment was 30 minutes and more than 50% was on counseling, review of test results, and coordination of care.   Santiago Glad, NP-C 03/21/2023

## 2023-03-21 ENCOUNTER — Encounter: Payer: Self-pay | Admitting: Nurse Practitioner

## 2023-03-21 ENCOUNTER — Telehealth: Payer: Self-pay

## 2023-03-21 ENCOUNTER — Other Ambulatory Visit: Payer: Self-pay

## 2023-03-21 DIAGNOSIS — E1165 Type 2 diabetes mellitus with hyperglycemia: Secondary | ICD-10-CM

## 2023-03-21 NOTE — Telephone Encounter (Signed)
Pt advised of lab results and recommendations wit VU.  He agreed to this plan of care and will re ck labs in 3 mo

## 2023-03-21 NOTE — Telephone Encounter (Signed)
-----   Message from Pollyann Samples sent at 03/21/2023  8:19 AM EST ----- Please let pt know iron labs, his anemia is multifactorial, small component from iron deficiency but more likely from CKD/chronic disease as he and I discussed. He was off oral iron for a while and has restarted. I recommend to continue oral iron daily, with vit C source, and separate from PPI. If ferritin lower and has persistent iron deficiency in 3 months, will offer IV Iron.   Thanks Lacie NP

## 2023-03-24 ENCOUNTER — Other Ambulatory Visit: Payer: Commercial Managed Care - HMO

## 2023-03-25 LAB — HEMOGLOBIN A1C
Hgb A1c MFr Bld: 6.5 %{Hb} — ABNORMAL HIGH (ref <5.7)
Mean Plasma Glucose: 140 mg/dL
eAG (mmol/L): 7.7 mmol/L

## 2023-03-25 LAB — BASIC METABOLIC PANEL
BUN/Creatinine Ratio: 9 (calc) (ref 6–22)
BUN: 22 mg/dL (ref 7–25)
CO2: 27 mmol/L (ref 20–32)
Calcium: 9 mg/dL (ref 8.6–10.3)
Chloride: 98 mmol/L (ref 98–110)
Creat: 2.55 mg/dL — ABNORMAL HIGH (ref 0.70–1.30)
Glucose, Bld: 70 mg/dL (ref 65–99)
Potassium: 5.3 mmol/L (ref 3.5–5.3)
Sodium: 133 mmol/L — ABNORMAL LOW (ref 135–146)

## 2023-03-28 ENCOUNTER — Other Ambulatory Visit (HOSPITAL_COMMUNITY): Payer: Self-pay

## 2023-03-28 ENCOUNTER — Telehealth: Payer: Self-pay

## 2023-03-28 NOTE — Telephone Encounter (Signed)
Pharmacy Patient Advocate Encounter   Received notification from CoverMyMeds that prior authorization for Evaristo Bury is required/requested.   Insurance verification completed.   The patient is insured through [employer] .   Per test claim: The current 30 day co-pay is, $0.  No PA needed at this time. This test claim was processed through [facility]- copay amounts may vary at other pharmacies due to pharmacy/plan contracts, or as the patient moves through the different stages of their insurance plan.

## 2023-03-31 ENCOUNTER — Ambulatory Visit: Payer: Commercial Managed Care - HMO | Admitting: Endocrinology

## 2023-04-01 ENCOUNTER — Ambulatory Visit: Payer: Commercial Managed Care - HMO | Admitting: Endocrinology

## 2023-04-01 ENCOUNTER — Encounter: Payer: Self-pay | Admitting: Endocrinology

## 2023-04-01 DIAGNOSIS — Z794 Long term (current) use of insulin: Secondary | ICD-10-CM

## 2023-04-01 DIAGNOSIS — E11649 Type 2 diabetes mellitus with hypoglycemia without coma: Secondary | ICD-10-CM

## 2023-04-01 DIAGNOSIS — E118 Type 2 diabetes mellitus with unspecified complications: Secondary | ICD-10-CM

## 2023-04-01 MED ORDER — HUMALOG KWIKPEN 100 UNIT/ML ~~LOC~~ SOPN: PEN_INJECTOR | SUBCUTANEOUS | 4 refills | Status: AC

## 2023-04-01 MED ORDER — TRULICITY 3 MG/0.5ML ~~LOC~~ SOAJ: 3.0000 mg | SUBCUTANEOUS | 3 refills | Status: DC

## 2023-04-01 MED ORDER — TRESIBA FLEXTOUCH 200 UNIT/ML ~~LOC~~ SOPN
90.0000 [IU] | PEN_INJECTOR | Freq: Every day | SUBCUTANEOUS | 3 refills | Status: DC
  Filled 2023-04-19: qty 9, 20d supply, fill #0
  Filled 2023-05-12: qty 9, 20d supply, fill #1

## 2023-04-01 NOTE — Patient Instructions (Signed)
No change 

## 2023-04-01 NOTE — Progress Notes (Signed)
Outpatient Endocrinology Note [place] Rikita Grabert, MD   Austin Alexander's Name: Austin Alexander    DOB: March 19, 1971    MRN: [id number]                                                    REASON OF VISIT: Follow up for type 2 diabetes mellitus  PCP: Gwenyth Bender, MD  HISTORY OF PRESENT ILLNESS:   Austin Alexander is a 52 y.o. old male with past medical history listed below, is here for follow up for type 2 diabetes mellitus.   Pertinent Diabetes History: Austin Alexander was previously seen by Dr. Lucianne Muss and was last time seen in August 2024.  Austin Alexander was diagnosed with type 2 diabetes mellitus in 2009.  Insulin therapy was started around 2015.  Chronic Diabetes Complications : Retinopathy: no. Last ophthalmology exam was done on annually, reportedly, following with ophthalmology regularly.  Nephropathy: CKD, on ACE/ARB , nephrology every 4 months.  On Farxiga.  On  Entresto. Peripheral neuropathy: mildly present Coronary artery disease: no Stroke: no  Relevant comorbidities and cardiovascular risk factors: Obesity: yes Body mass index is 42.59 kg/m.  Hypertension: Yes  Hyperlipidemia : Yes, on statin   Current / Home Diabetic regimen includes:  INSULIN regimen is: [place] 90 units daily, Humalog 0-12 units in am, 0-12 units at lunch and 0-12 dinner    Non-insulin hypoglycemic drugs the Austin Alexander is taking are: Farxiga 10 mg daily, Trulicity 3 mg weekly  Prior diabetic medications: Glimepiride/Amaryl and Actos/pioglitazone in the past.  Higher dose of Humalog in the past.  Jardiance in the past changed to [place] due to cost.   Glycemic data:    CONTINUOUS GLUCOSE MONITORING SYSTEM (CGMS) INTERPRETATION: At today's visit, we reviewed CGM downloads. The full report is scanned in the media. Reviewing the CGM trends, blood glucose are as follows:  Dexcom G7 CGM-  Sensor Download (Sensor download was reviewed and summarized below.) Dates: December 7 to April 01, 2023  Glucose Management Indicator:  6.3%   Interpretation: -Almost all blood sugar is acceptable.  Austin Alexander has occasional trending down blood sugar after meals seems to be related with using Humalog.  No concerning hypoglycemia.  No significant hyperglycemia.  Hypoglycemia: Austin Alexander has no hypoglycemic episodes. Austin Alexander has hypoglycemia awareness.  Factors modifying glucose control: 1.  Diabetic diet assessment: 2-3 meals a day.   2.  Staying active or exercising: Difficulty doing much exercise.  3.  Medication compliance: compliant al of the time.  Interval history  Diabetes regimen as noted above.  Austin Alexander has not been taking and requiring Humalog much lately uses Humalog as needed for correction of hyperglycemia.  CGM data as reviewed above.  Mostly acceptable blood sugar.  Hemoglobin A1c recently 6.5%.  No other complaints today.  Austin Alexander has been taking Trulicity 3 mg weekly, tolerating well and denies any GI issues.  Austin Alexander reports compliance with Evaristo Bury taking in the morning daily.  REVIEW OF SYSTEMS As per history of present illness.   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   Erectile dysfunction    Multifactorial. Improved with Cialis. New onset.   H/O folliculitis    Heartburn    Herpes exposure    History of herpetic exposure   Hyperlipidemia    Hypertension    variable in nature   Indigestion    Mild obesity    Multiple excoriations     multifactorial in origin   Situational stress    secondary to financial issues   Tobacco abuse     PAST SURGICAL HISTORY: Past Surgical History:  Procedure Laterality Date   BALLOON ENTEROSCOPY  12/28/2021   Procedure: BALLOON ENTEROSCOPY;  Surgeon: Shellia Cleverly, DO;  Location: [facility];  Service: Gastroenterology;;   ENTEROSCOPY N/A 12/28/2021   Procedure: SINGLE BALLOON ENTEROSCOPY;  Surgeon: Shellia Cleverly, DO;  Location: [facility];  Service: Gastroenterology;  Laterality: N/A;   GIVENS CAPSULE STUDY N/A 12/22/2021   Procedure: GIVENS CAPSULE STUDY;  Surgeon: Vida Rigger, MD;  [facility];  Service: Gastroenterology;  Laterality: N/A;   HOT HEMOSTASIS N/A 12/28/2021   Procedure: HOT HEMOSTASIS (ARGON PLASMA COAGULATION/BICAP);  Surgeon: Shellia Cleverly, DO;  Location: [facility];  Service: Gastroenterology;  Laterality: N/A;   I & D EXTREMITY Left 06/28/2014   Procedure: IRRIGATION AND DEBRIDEMENT EXTREMITY;  Surgeon: Betha Loa, MD;  Location: [facility] OR;  Service: Orthopedics;  Laterality: Left;   INCISION AND DRAINAGE PERIRECTAL ABSCESS N/A 02/17/2014   Procedure: IRRIGATION AND DEBRIDEMENT PERIRECTAL  AND SCROTAL ABSCESS;  Surgeon: Abigail Miyamoto, MD;  Location: [facility] OR;  Service: General;  Laterality: N/A;   MINOR IRRIGATION AND DEBRIDEMENT OF WOUND Left 06/20/2015   Procedure: IRRIGATION AND DEBRIDEMENT Wound with nailbed repair;  Surgeon: Betha Loa, MD;  Location: [facility];  Service: Orthopedics;  Laterality: Left;   SUBMUCOSAL TATTOO INJECTION  12/28/2021   Procedure: SUBMUCOSAL TATTOO INJECTION;  Surgeon: Shellia Cleverly, DO;  Location: [facility];  Service: Gastroenterology;;    ALLERGIES: No Known Allergies  FAMILY HISTORY:  Family History  Problem Relation Age of Onset   Diabetes type II Mother    Hypertension Mother    Iron deficiency Mother    Diabetes Other     SOCIAL HISTORY: Social History   Socioeconomic  History   Marital status: Single    Spouse name: Not on file   Number of children: Not on file   Years of education: Not on file   Highest education level: Not on file  Occupational History   Not on file  Tobacco Use   Smoking status: Every Day    Current packs/day: 1.00    Average packs/day: 1 pack/day for 20.0 years (20.0 ttl pk-yrs)    Types: Cigarettes   Smokeless tobacco: Never  Vaping Use   Vaping status: Never Used  Substance and Sexual Activity   Alcohol use: Not Currently    Alcohol/week: 12.0 standard drinks of alcohol    Types: 10 Cans of beer, 2 Shots of liquor per week    Comment: As of 04/06/14 - states occasional use   Drug use: No   Sexual activity: Yes  Other Topics  Concern   Not on file  Social History Narrative   Not on file   Social Drivers of Health   Financial Resource Strain: Not on file  Food Insecurity: No Food Insecurity (12/22/2021)   Hunger Vital Sign    Worried About Running Out of Food in the Last Year: Never true    Ran Out of Food in the Last Year: Never true  Transportation Needs: No Transportation Needs (12/22/2021)   PRAPARE - Administrator, Civil Service (Medical): No    Lack of Transportation (Non-Medical): No  Physical Activity: Not on file  Stress: Not on file  Social Connections: Not on file    MEDICATIONS:  Current Outpatient Medications  Medication Sig Dispense Refill   B-D ULTRAFINE III SHORT PEN 31G X 8 MM MISC SMARTSIG:Pre-Filled Pen Syringe SUB-Q Daily     blood glucose meter kit and supplies Dispense based on Austin Alexander and insurance preference. Use up to four times daily as directed. (FOR ICD-9 250.00, 250.01). 1 each 0   carvedilol (COREG) 25 MG tablet Take 25 mg by mouth 2 (two) times daily with a meal.     Continuous Blood Gluc Receiver (DEXCOM G7 RECEIVER) DEVI USE TO CHECK BLOOD SUGAR DAILY 1 each 0   Continuous Glucose Sensor (DEXCOM G7 SENSOR) MISC Change every 10 days 6 each 3   dapagliflozin  propanediol (FARXIGA) 10 MG TABS tablet Take by mouth daily.     digoxin (LANOXIN) 0.125 MG tablet Take 125 mcg by mouth daily.     doxycycline (VIBRA-TABS) 100 MG tablet Take 100 mg by mouth daily.     ENTRESTO 49-51 MG Take 1 tablet by mouth 2 (two) times daily.     fenofibrate (TRICOR) 48 MG tablet Take 1 tablet (48 mg total) by mouth daily. 90 tablet 1   pantoprazole (PROTONIX) 40 MG tablet Take 1 tablet (40 mg total) by mouth daily. 30 tablet 1   simvastatin (ZOCOR) 40 MG tablet Take 1 tablet (40 mg total) by mouth daily. 30 tablet 0   Continuous Blood Gluc Transmit (DEXCOM G6 TRANSMITTER) MISC Change every 90 days 3 each 1   Dulaglutide (TRULICITY) 3 MG/0.5ML SOAJ Inject 3 mg into the skin once a week. Pharmacy Austin Alexander Advocate Encounter  Received notification from [employer] that Prior Authorization for Trulicity has been APPROVED through 12/06/2023  PA #/Case ID/Reference #: [id number] 6 mL 3   HUMALOG KWIKPEN 100 UNIT/ML KwikPen INJECT 0- 10 UNITS with meals, as needed as instructed, maximum 30 units/day. 15 mL 4   insulin degludec (TRESIBA FLEXTOUCH) 200 UNIT/ML FlexTouch Pen Inject 90 Units into the skin daily. 45 mL 3   No current facility-administered medications for this visit.    PHYSICAL EXAM: Vitals:   04/01/23 1018  BP: 138/70  Pulse: 70  Resp: 20  SpO2: 94%  Weight: (!) 305 lb 6.4 oz (138.5 kg)  Height: 5\' 11"  (1.803 m)   Body mass index is 42.59 kg/m.  Wt Readings from Last 3 Encounters:  04/01/23 (!) 305 lb 6.4 oz (138.5 kg)  03/16/23 (!) 303 lb (137.4 kg)  11/29/22 300 lb (136.1 kg)    General: Well developed, well nourished male in no apparent distress.  HEENT: AT/[place], no external lesions.  Eyes: Conjunctiva clear and no icterus. Neck: Neck supple  Lungs: Respirations not labored Neurologic: Alert, oriented, normal speech Extremities / Skin: Dry. No sores or rashes noted.   Psychiatric: Does not appear depressed or anxious   Diabetic Foot Exam -  Simple   No  data filed     LABS Reviewed Lab Results  Component Value Date   HGBA1C 6.5 (H) 03/24/2023   HGBA1C 6.6 (H) 11/25/2022   HGBA1C 7.6 (H) 09/02/2022   Lab Results  Component Value Date   FRUCTOSAMINE 249 01/25/2022   FRUCTOSAMINE 217 02/19/2020   FRUCTOSAMINE 346 (H) 07/20/2019   Lab Results  Component Value Date   CHOL 124 09/02/2022   HDL 31.40 (L) 09/02/2022   LDLCALC 60 09/02/2022   LDLDIRECT 76.0 01/25/2022   TRIG 163.0 (H) 09/02/2022   CHOLHDL 4 09/02/2022   Lab Results  Component Value Date   MICRALBCREAT 110.7 (H) 07/23/2020   MICRALBCREAT 97.0 (H) 07/20/2019   Lab Results  Component Value Date   CREATININE 2.55 (H) 03/24/2023   Lab Results  Component Value Date   GFR 42.56 (L) 09/02/2022    ASSESSMENT / PLAN  1. Controlled type 2 diabetes mellitus with complication, with long-term current use of insulin (HCC)   2. Type 2 diabetes mellitus with hypoglycemia without coma, with long-term current use of insulin (HCC)     Diabetes Mellitus type 2, complicated by CKD - Diabetic status / severity: Fair control.  Lab Results  Component Value Date   HGBA1C 6.5 (H) 03/24/2023   GMI on CGM 6.3%.  - Hemoglobin A1c goal : <7%  Discussed about increasing dose of Trulicity to 4.5 mg weekly and decreasing dose of Tresiba.  Austin Alexander prefers to stay on current dose of Trulicity.  - Medications: See below.  I) continue Tresiba 90 units daily. II) continue Trulicity 3 mg weekly. III) continue Humalog as needed, discussed instruction when to use Humalog.    - Home glucose testing: CGM/Dexcom G7 and check as needed. - Discussed/ Gave Hypoglycemia treatment plan.  # Consult : not required at this time.   # Annual urine for microalbuminuria/ creatinine ratio, + microalbuminuria currently, continue ACE/ARB ?  On Entresto and Farxiga.  Following with nephrology regularly.  Urine microalbumin creatinine ratio elevated 1746 in September 2024 reviewed nephrology  note. Last  Lab Results  Component Value Date   MICRALBCREAT 110.7 (H) 07/23/2020    # Foot check nightly.  # Annual dilated diabetic eye exams.   - Diet: Make healthy diabetic food choices - Life style / activity / exercise: Discussed.  2. Blood pressure  -  BP Readings from Last 1 Encounters:  04/01/23 138/70    - Control is in target.  - No change in current plans.  3. Lipid status / Hyperlipidemia - Last  Lab Results  Component Value Date   LDLCALC 60 09/02/2022   - Continue simvastatin 40 mg daily.  Fenofibrate 48 mg daily.  Diagnoses and all orders for this visit:  Controlled type 2 diabetes mellitus with complication, with long-term current use of insulin (HCC) -     insulin degludec (TRESIBA FLEXTOUCH) 200 UNIT/ML FlexTouch Pen; Inject 90 Units into the skin daily. -     HUMALOG KWIKPEN 100 UNIT/ML KwikPen; INJECT 0- 10 UNITS with meals, as needed as instructed, maximum 30 units/day. -     Lipid panel -     BASIC METABOLIC PANEL WITH GFR -     Hemoglobin A1c  Type 2 diabetes mellitus with hypoglycemia without coma, with long-term current use of insulin (HCC) -     Dulaglutide (TRULICITY) 3 MG/0.5ML SOAJ; Inject 3 mg into the skin once a week. Pharmacy Austin Alexander Advocate Encounter  Received notification from [employer] that Prior  Authorization for Trulicity has been APPROVED through 12/06/2023  PA #/Case ID/Reference #: [id number]    DISPOSITION Follow up in clinic in 4 months suggested.   All questions answered and Austin Alexander verbalized understanding of the plan.  [place] Mickaela Starlin, MD [facility] Endocrinology [facility] [address] [place], [address] [place], [place] [address] Phone # [phone]  At least part of this note was generated using voice recognition software. Inadvertent word errors may have occurred, which were not recognized during the proofreading process.

## 2023-04-04 ENCOUNTER — Encounter: Payer: Self-pay | Admitting: Endocrinology

## 2023-04-11 ENCOUNTER — Telehealth: Payer: Self-pay

## 2023-04-11 NOTE — Telephone Encounter (Signed)
Another PA request was sent from pharmacy on 12/29 for PA for Austin Alexander, will route to PA to call pharmacy.

## 2023-04-12 ENCOUNTER — Other Ambulatory Visit (HOSPITAL_COMMUNITY): Payer: Self-pay

## 2023-04-12 NOTE — Telephone Encounter (Signed)
complete

## 2023-04-14 ENCOUNTER — Other Ambulatory Visit (HOSPITAL_COMMUNITY): Payer: Self-pay

## 2023-04-14 ENCOUNTER — Telehealth: Payer: Self-pay | Admitting: Pharmacy Technician

## 2023-04-14 NOTE — Telephone Encounter (Signed)
 Pharmacy Patient Advocate Encounter   Received notification from Pt Calls Messages that prior authorization for Tresiba  200u is required/requested.   Insurance verification completed.   The patient is insured through [employer] .   Per test claim: The current 27 day co-pay is, $152.77.  No PA needed at this time. This test claim was processed through [facility]- copay amounts may vary at other pharmacies due to pharmacy/plan contracts, or as the patient moves through the different stages of their insurance plan.     Called the pharmacy to see what rejection they were getting to not be able to process this last month. They had to adjust the quantity. It was still very expensive. Last year he used a discount card of some sort, so she used that instead of his ins. $99 for 42 day supply.

## 2023-04-14 NOTE — Telephone Encounter (Signed)
 Called the pharmacy. Unfortunately they weren't running it correctly & now his deductible has started over. I had them look to see what he did last year, since he's been on the med for a while. She put it on a discount card of some sort, not on the ins. A 40 something day supply (I think she said 71) is $99. They may be able to do an eve lesser amount to make it more affordable. Once he meets his deductible, it will be less with his insurance.

## 2023-04-15 ENCOUNTER — Telehealth: Payer: Self-pay

## 2023-04-15 NOTE — Telephone Encounter (Signed)
 Per patient insurance called and patient needs a new PA for the new year for [place]

## 2023-04-18 ENCOUNTER — Other Ambulatory Visit (HOSPITAL_COMMUNITY): Payer: Self-pay

## 2023-04-19 ENCOUNTER — Other Ambulatory Visit (HOSPITAL_COMMUNITY): Payer: Self-pay

## 2023-04-19 ENCOUNTER — Telehealth: Payer: Self-pay

## 2023-04-19 ENCOUNTER — Other Ambulatory Visit: Payer: Self-pay

## 2023-04-19 NOTE — Telephone Encounter (Signed)
 Spoke with patient regarding the PA results, patient aware. Patient spoke to pharmacy who will supply patient with another discount card. No further questions at this time.

## 2023-05-12 ENCOUNTER — Other Ambulatory Visit (HOSPITAL_COMMUNITY): Payer: Self-pay

## 2023-05-25 ENCOUNTER — Other Ambulatory Visit (HOSPITAL_COMMUNITY): Payer: Self-pay

## 2023-05-25 ENCOUNTER — Telehealth: Payer: Self-pay

## 2023-05-25 NOTE — Telephone Encounter (Signed)
Pharmacy Patient Advocate Encounter   Received notification from CoverMyMeds that prior authorization for Dexcom G7 Sensor is required/requested.   Insurance verification completed.   The patient is insured through [employer] .   Per test claim: Refill too soon. PA is not needed at this time. Medication was filled 05/21/2023. Next eligible fill date is 06/04/2023.

## 2023-06-09 ENCOUNTER — Other Ambulatory Visit: Payer: Self-pay

## 2023-06-09 DIAGNOSIS — E118 Type 2 diabetes mellitus with unspecified complications: Secondary | ICD-10-CM

## 2023-06-09 MED ORDER — TRESIBA FLEXTOUCH 200 UNIT/ML ~~LOC~~ SOPN: 90.0000 [IU] | PEN_INJECTOR | Freq: Every day | SUBCUTANEOUS | 3 refills | Status: DC

## 2023-06-15 ENCOUNTER — Other Ambulatory Visit (HOSPITAL_COMMUNITY): Payer: Self-pay

## 2023-06-15 ENCOUNTER — Inpatient Hospital Stay: Payer: Commercial Managed Care - HMO | Attending: Hematology and Oncology

## 2023-06-15 DIAGNOSIS — D500 Iron deficiency anemia secondary to blood loss (chronic): Secondary | ICD-10-CM | POA: Insufficient documentation

## 2023-06-15 LAB — CMP (CANCER CENTER ONLY)
ALT: 8 U/L (ref 0–44)
AST: 8 U/L — ABNORMAL LOW (ref 15–41)
Albumin: 3.5 g/dL (ref 3.5–5.0)
Alkaline Phosphatase: 80 U/L (ref 38–126)
Anion gap: 5 (ref 5–15)
BUN: 29 mg/dL — ABNORMAL HIGH (ref 6–20)
CO2: 26 mmol/L (ref 22–32)
Calcium: 8.6 mg/dL — ABNORMAL LOW (ref 8.9–10.3)
Chloride: 102 mmol/L (ref 98–111)
Creatinine: 2.84 mg/dL — ABNORMAL HIGH (ref 0.61–1.24)
GFR, Estimated: 26 mL/min — ABNORMAL LOW (ref >60)
Glucose, Bld: 77 mg/dL (ref 70–99)
Potassium: 5.3 mmol/L — ABNORMAL HIGH (ref 3.5–5.1)
Sodium: 133 mmol/L — ABNORMAL LOW (ref 135–145)
Total Bilirubin: 0.3 mg/dL (ref 0.0–1.2)
Total Protein: 8.2 g/dL — ABNORMAL HIGH (ref 6.5–8.1)

## 2023-06-15 LAB — CBC WITH DIFFERENTIAL (CANCER CENTER ONLY)
Abs Immature Granulocytes: 0.03 10*3/uL (ref 0.00–0.07)
Basophils Absolute: 0 10*3/uL (ref 0.0–0.1)
Basophils Relative: 0 %
Eosinophils Absolute: 0.6 10*3/uL — ABNORMAL HIGH (ref 0.0–0.5)
Eosinophils Relative: 6 %
HCT: 26.8 % — ABNORMAL LOW (ref 39.0–52.0)
Hemoglobin: 8.1 g/dL — ABNORMAL LOW (ref 13.0–17.0)
Immature Granulocytes: 0 %
Lymphocytes Relative: 11 %
Lymphs Abs: 1 10*3/uL (ref 0.7–4.0)
MCH: 23.9 pg — ABNORMAL LOW (ref 26.0–34.0)
MCHC: 30.2 g/dL (ref 30.0–36.0)
MCV: 79.1 fL — ABNORMAL LOW (ref 80.0–100.0)
Monocytes Absolute: 0.5 10*3/uL (ref 0.1–1.0)
Monocytes Relative: 5 %
Neutro Abs: 7.1 10*3/uL (ref 1.7–7.7)
Neutrophils Relative %: 78 %
Platelet Count: 420 10*3/uL — ABNORMAL HIGH (ref 150–400)
RBC: 3.39 MIL/uL — ABNORMAL LOW (ref 4.22–5.81)
RDW: 18.2 % — ABNORMAL HIGH (ref 11.5–15.5)
WBC Count: 9.1 10*3/uL (ref 4.0–10.5)
nRBC: 0 % (ref 0.0–0.2)

## 2023-06-15 LAB — IRON AND IRON BINDING CAPACITY (CC-WL,HP ONLY)
Iron: 35 ug/dL — ABNORMAL LOW (ref 45–182)
Saturation Ratios: 13 % — ABNORMAL LOW (ref 17.9–39.5)
TIBC: 263 ug/dL (ref 250–450)
UIBC: 228 ug/dL (ref 117–376)

## 2023-06-15 LAB — FERRITIN: Ferritin: 16 ng/mL — ABNORMAL LOW (ref 24–336)

## 2023-06-16 ENCOUNTER — Other Ambulatory Visit: Payer: Self-pay | Admitting: Physician Assistant

## 2023-06-17 ENCOUNTER — Telehealth: Payer: Self-pay | Admitting: *Deleted

## 2023-06-17 NOTE — Telephone Encounter (Signed)
 TCT patient regarding his recent lab results.  Spoke with him. Advised that he has iron deficiency anemia and will need IV iron. I send orders to [facility]. Please request follow up with his GI (Dr. Forrest Moron) Advised to expect a call from [facility] to set up those infusion appointments. He also states he is going to call his GI doctor today to follow up with him.

## 2023-06-17 NOTE — Telephone Encounter (Signed)
-----   Message from Briant Cedar sent at 06/16/2023  9:30 PM EST ----- Please notify patient that he has iron deficiency anemia and will need IV iron. I send orders to [facility]. Please request follow up with his GI (Dr. Forrest Moron) ----- Message ----- From: Interface, Lab In [place] Sent: 06/15/2023  11:44 AM EST To: Briant Cedar, PA-C

## 2023-06-20 ENCOUNTER — Telehealth: Payer: Self-pay

## 2023-06-20 NOTE — Telephone Encounter (Signed)
 Austin Alexander, patient will be scheduled as soon as possible.  Auth Submission: APPROVED Site of care: Site of care: [facility] Payer: [employer] commercial Medication & CPT/J Code(s) submitted: Feraheme (ferumoxytol) [id number] Route of submission (phone, fax, portal): fax Phone # Fax # Auth type: Buy/Bill PB Units/visits requested: 510mg  x 2 doses Reference number: [id number] Approval from: 06/17/23 to 06/15/24

## 2023-07-12 ENCOUNTER — Other Ambulatory Visit (HOSPITAL_COMMUNITY): Payer: Self-pay

## 2023-07-18 ENCOUNTER — Other Ambulatory Visit (HOSPITAL_COMMUNITY): Payer: Self-pay

## 2023-07-18 ENCOUNTER — Telehealth: Payer: Self-pay | Admitting: Pharmacy Technician

## 2023-07-18 NOTE — Telephone Encounter (Signed)
 Pharmacy Patient Advocate Encounter   Received notification from CoverMyMeds that prior authorization for Tresiba FlexTouch (insulin degludec injection) 200 Units/mL solution is required/requested.   Insurance verification completed.   The patient is insured through [employer] .   Per test claim: PA required; PA submitted to above mentioned insurance via CoverMyMeds Key/confirmation #/EOC [id number] Status is pending

## 2023-07-18 NOTE — Telephone Encounter (Signed)
 Pharmacy Patient Advocate Encounter  Received notification from [employer] that Prior Authorization for Tresiba FlexTouch (insulin degludec injection) 200 Units/mL solution has been APPROVED from 07/18/2023 to 07/17/2024.  **Quantity limit 27 mL/30 days.**   PA #/Case ID/Reference #: [id number]

## 2023-08-02 ENCOUNTER — Telehealth: Payer: Self-pay

## 2023-08-02 NOTE — Telephone Encounter (Signed)
 Patient called to inquire about prior auth for Tresiba . Info relayed from notes from [place] team

## 2023-08-04 ENCOUNTER — Other Ambulatory Visit: Payer: Commercial Managed Care - HMO

## 2023-08-08 ENCOUNTER — Encounter: Payer: Self-pay | Admitting: Endocrinology

## 2023-08-08 ENCOUNTER — Ambulatory Visit: Payer: Commercial Managed Care - HMO | Admitting: Endocrinology

## 2023-08-08 VITALS — BP 130/70 | HR 80 | Resp 20 | Ht 71.0 in | Wt 293.6 lb

## 2023-08-08 DIAGNOSIS — E118 Type 2 diabetes mellitus with unspecified complications: Secondary | ICD-10-CM

## 2023-08-08 DIAGNOSIS — E1165 Type 2 diabetes mellitus with hyperglycemia: Secondary | ICD-10-CM

## 2023-08-08 DIAGNOSIS — Z794 Long term (current) use of insulin: Secondary | ICD-10-CM | POA: Diagnosis not present

## 2023-08-08 LAB — POCT GLYCOSYLATED HEMOGLOBIN (HGB A1C): Hemoglobin A1C: 6.2 % — AB (ref 4.0–5.6)

## 2023-08-08 MED ORDER — TRESIBA FLEXTOUCH 200 UNIT/ML ~~LOC~~ SOPN: 90.0000 [IU] | PEN_INJECTOR | Freq: Every day | SUBCUTANEOUS | 11 refills | Status: DC

## 2023-08-08 NOTE — Progress Notes (Signed)
 Outpatient Endocrinology Note [place] Sherry Blackard, MD   Patient's Name: Austin Alexander    DOB: [birth year]    MRN: [id number]                                                    REASON OF VISIT: Follow up for type 2 diabetes mellitus  PCP: Ferrell Hu, MD  HISTORY OF PRESENT ILLNESS:   Sebastyn Tiedt is a 53 y.o. old male with past medical history listed below, is here for follow up for type 2 diabetes mellitus.   Pertinent Diabetes History: Patient was previously seen by Dr. Hubert Madden and was last time seen in August 2024.  Patient was diagnosed with type 2 diabetes mellitus in 2009.  Insulin  therapy was started around 2015.  Chronic Diabetes Complications : Retinopathy: no. Last ophthalmology exam was done on annually, reportedly, following with ophthalmology regularly.  Nephropathy: CKD, on ACE/ARB , nephrology every 4 months.  On Farxiga.  On  Entresto. Peripheral neuropathy: mildly present Coronary artery disease: no Stroke: no  Relevant comorbidities and cardiovascular risk factors: Obesity: yes Body mass index is 40.95 kg/m.  Hypertension: Yes  Hyperlipidemia : Yes, on statin   Current / Home Diabetic regimen includes:  INSULIN  regimen is: Tresiba  90 units daily, Humalog  0-12 units in am, 0-12 units at lunch and 0-12 units dinner    Non-insulin  hypoglycemic drugs the patient is taking are: Farxiga 10 mg daily, Trulicity  3 mg weekly.  Patient preferred to stay on Trulicity  3 mg weekly.  Prior diabetic medications: Glimepiride /Amaryl  and Actos/pioglitazone in the past.  Higher dose of Humalog  in the past.  Jardiance in the past changed to [place] due to cost.  Glycemic data:    CONTINUOUS GLUCOSE MONITORING SYSTEM (CGMS) INTERPRETATION: At today's visit, we reviewed CGM downloads. The full report is scanned in the media. Reviewing the CGM trends, blood glucose are as follows:  Dexcom G7 CGM-  Sensor Download (Sensor download was reviewed and summarized below.) Dates: April 15  to August 08, 2023, 14 days  Glucose Management Indicator: 6.6%    Interpretation: Almost all acceptable blood sugar.  Rare mild hyperglycemia with blood sugar in 200s postprandially.  Rarely blood sugar in 60s in between the meals, with disconnection with connectivity at times probably related with sensor issues.  No concerning hypoglycemia.  Hypoglycemia: Patient has no hypoglycemic episodes. Patient has hypoglycemia awareness.  Factors modifying glucose control: 1.  Diabetic diet assessment: 2-3 meals a day.   2.  Staying active or exercising: Difficulty doing much exercise.  3.  Medication compliance: compliant al of the time.  Interval history  Diabetes regimen as reviewed above.  Patient reports he ran out of Tresiba  couple of weeks ago, has been taking higher dose of Humalog  to maintain normal blood sugar.  He reports he had problems refilling Tresiba  each month.  CGM data as reviewed above, mostly acceptable blood sugar.  He has been tolerating Trulicity  well.  No other complaints today.  Denies hypoglycemic symptoms.  REVIEW OF SYSTEMS As per history of present illness.   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   Erectile dysfunction    Multifactorial. Improved with Cialis. New onset.   H/O folliculitis    Heartburn    Herpes exposure    History of herpetic exposure   Hyperlipidemia     Hypertension    variable in nature   Indigestion    Mild obesity    Multiple excoriations    multifactorial in origin   Situational stress    secondary to financial issues   Tobacco abuse     PAST SURGICAL HISTORY: Past Surgical History:  Procedure Laterality Date   BALLOON ENTEROSCOPY  12/28/2021   Procedure: BALLOON ENTEROSCOPY;  Surgeon: Annis Kinder, DO;  Location: [facility];  Service: Gastroenterology;;   ENTEROSCOPY N/A 12/28/2021   Procedure: SINGLE BALLOON ENTEROSCOPY;  Surgeon: Annis Kinder, DO;  Location: [facility];  Service: Gastroenterology;  Laterality: N/A;   GIVENS CAPSULE STUDY N/A 12/22/2021   Procedure: GIVENS CAPSULE STUDY;  Surgeon: Ozell Blunt, MD;  Location: [facility];  Service: Gastroenterology;  Laterality: N/A;   HOT HEMOSTASIS N/A 12/28/2021   Procedure: HOT HEMOSTASIS (ARGON PLASMA COAGULATION/BICAP);  Surgeon: Annis Kinder, DO;  Location: [facility];  Service: Gastroenterology;  Laterality: N/A;   I & D EXTREMITY Left 06/28/2014   Procedure: IRRIGATION AND DEBRIDEMENT EXTREMITY;  Surgeon: Brunilda Capra, MD;  Location: [facility] OR;  Service: Orthopedics;  Laterality: Left;   INCISION AND DRAINAGE PERIRECTAL ABSCESS N/A 02/17/2014   Procedure: IRRIGATION AND DEBRIDEMENT PERIRECTAL  AND SCROTAL ABSCESS;  Surgeon: Oza Blumenthal, MD;  Location: [facility] OR;  Service: General;  Laterality: N/A;   MINOR IRRIGATION AND DEBRIDEMENT OF WOUND Left 06/20/2015   Procedure: IRRIGATION AND DEBRIDEMENT Wound with nailbed repair;  Surgeon: Brunilda Capra, MD;  Location: [facility];  Service: Orthopedics;  Laterality: Left;   SUBMUCOSAL TATTOO INJECTION  12/28/2021   Procedure: SUBMUCOSAL TATTOO INJECTION;  Surgeon: Annis Kinder, DO;  Location: [facility];  Service: Gastroenterology;;    ALLERGIES: No Known Allergies  FAMILY HISTORY:  Family History  Problem Relation Age of Onset   Diabetes type II Mother    Hypertension Mother    Iron   deficiency Mother    Diabetes Other     SOCIAL HISTORY: Social History   Socioeconomic History   Marital status: Single    Spouse name: Not on file   Number of children: Not on file   Years of education: Not on file   Highest education level: Not on file  Occupational History   Not on file  Tobacco Use   Smoking status: Every Day    Current packs/day: 1.00    Average packs/day: 1 pack/day for 20.0 years (20.0 ttl pk-yrs)    Types: Cigarettes   Smokeless tobacco: Never  Vaping Use   Vaping status: Never Used  Substance and Sexual Activity   Alcohol use: Not Currently    Alcohol/week: 12.0 standard drinks of alcohol    Types: 10 Cans of beer, 2 Shots of liquor per week    Comment: As of 04/06/14 -  states occasional use   Drug use: No   Sexual activity: Yes  Other Topics Concern   Not on file  Social History Narrative   Not on file   Social Drivers of Health   Financial Resource Strain: Not on file  Food Insecurity: No Food Insecurity (12/22/2021)   Hunger Vital Sign    Worried About Running Out of Food in the Last Year: Never true    Ran Out of Food in the Last Year: Never true  Transportation Needs: No Transportation Needs (12/22/2021)   PRAPARE - Administrator, Civil Service (Medical): No    Lack of Transportation (Non-Medical): No  Physical Activity: Not on file  Stress: Not on file  Social Connections: Not on file    MEDICATIONS:  Current Outpatient Medications  Medication Sig Dispense Refill   B-D ULTRAFINE III SHORT PEN 31G X 8 MM MISC SMARTSIG:Pre-Filled Pen Syringe SUB-Q Daily     blood glucose meter kit and supplies Dispense based on patient and insurance preference. Use up to four times daily as directed. (FOR ICD-9 250.00, 250.01). 1 each 0   carvedilol  (COREG ) 25 MG tablet Take 25 mg by mouth 2 (two) times daily with a meal.     Continuous Blood Gluc Receiver (DEXCOM G7 RECEIVER) DEVI USE TO CHECK BLOOD SUGAR DAILY 1 each 0   Continuous  Glucose Sensor (DEXCOM G7 SENSOR) MISC Change every 10 days 6 each 3   dapagliflozin propanediol (FARXIGA) 10 MG TABS tablet Take by mouth daily.     digoxin  (LANOXIN ) 0.125 MG tablet Take 125 mcg by mouth daily.     doxycycline  (VIBRA -TABS) 100 MG tablet Take 100 mg by mouth daily.     Dulaglutide  (TRULICITY ) 3 MG/0.5ML SOAJ Inject 3 mg into the skin once a week. Pharmacy Patient Advocate Encounter  Received notification from [employer] that Prior Authorization for Trulicity  has been APPROVED through 12/06/2023  PA #/Case ID/Reference #: [id number] 6 mL 3   ENTRESTO 49-51 MG Take 1 tablet by mouth 2 (two) times daily.     fenofibrate  (TRICOR ) 48 MG tablet Take 1 tablet (48 mg total) by mouth daily. 90 tablet 1   HUMALOG  KWIKPEN 100 UNIT/ML KwikPen INJECT 0- 10 UNITS with meals, as needed as instructed, maximum 30 units/day. 15 mL 4   pantoprazole  (PROTONIX ) 40 MG tablet Take 1 tablet (40 mg total) by mouth daily. 30 tablet 1   simvastatin  (ZOCOR ) 40 MG tablet Take 1 tablet (40 mg total) by mouth daily. 30 tablet 0   Continuous Blood Gluc Transmit (DEXCOM G6 TRANSMITTER) MISC Change every 90 days 3 each 1   insulin  degludec (TRESIBA  FLEXTOUCH) 200 UNIT/ML FlexTouch Pen Inject 90 Units into the skin daily. 15 mL 11   No current facility-administered medications for this visit.    PHYSICAL EXAM: Vitals:   08/08/23 0957 08/08/23 0958  BP: (!) 150/86 130/70  Pulse: 80   Resp: 20   SpO2: 94%   Weight: 293 lb 9.6 oz (133.2 kg)   Height: 5\' 11"  (1.803 m)     Body mass index is 40.95 kg/m.  Wt Readings from Last 3 Encounters:  08/08/23 293 lb 9.6 oz (133.2 kg)  04/01/23 (!) 305 lb 6.4 oz (138.5 kg)  03/16/23 (!) 303 lb (137.4 kg)    General: Well developed, well nourished male in no apparent distress.  HEENT: AT/[place], no external lesions.  Eyes: Conjunctiva clear and no icterus. Neck: Neck supple  Lungs: Respirations not labored Neurologic:  Alert, oriented, normal speech Extremities / Skin:  Dry.  Psychiatric: Does not appear depressed or anxious   Diabetic Foot Exam - Simple   No data filed     LABS Reviewed Lab Results  Component Value Date   HGBA1C 6.2 (A) 08/08/2023   HGBA1C 6.5 (H) 03/24/2023   HGBA1C 6.6 (H) 11/25/2022   Lab Results  Component Value Date   FRUCTOSAMINE 249 01/25/2022   FRUCTOSAMINE 217 02/19/2020   FRUCTOSAMINE 346 (H) 07/20/2019   Lab Results  Component Value Date   CHOL 124 09/02/2022   HDL 31.40 (L) 09/02/2022   LDLCALC 60 09/02/2022   LDLDIRECT 76.0 01/25/2022   TRIG 163.0 (H) 09/02/2022   CHOLHDL 4 09/02/2022   Lab Results  Component Value Date   MICRALBCREAT 110.7 (H) 07/23/2020   MICRALBCREAT 97.0 (H) 07/20/2019   Lab Results  Component Value Date   CREATININE 2.84 (H) 06/15/2023   Lab Results  Component Value Date   GFR 42.56 (L) 09/02/2022    ASSESSMENT / PLAN  1. Type 2 diabetes mellitus with hyperglycemia, with long-term current use of insulin  (HCC)   2. Controlled type 2 diabetes mellitus with complication, with long-term current use of insulin  (HCC)     Diabetes Mellitus type 2, complicated by CKD - Diabetic status / severity: Fair control.  Lab Results  Component Value Date   HGBA1C 6.2 (A) 08/08/2023   GMI on CGM 6.3%.  - Hemoglobin A1c goal : <6.5%  No change.  Tresiba  renewed.   - Medications: See below.  I) continue Tresiba  90 units daily. II) continue Trulicity  3 mg weekly. III) continue Humalog  as needed, discussed instruction when to use Humalog .    - Home glucose testing: CGM/Dexcom G7 and check as needed. - Discussed/ Gave Hypoglycemia treatment plan.  # Consult : not required at this time.   # Annual urine for microalbuminuria/ creatinine ratio, + microalbuminuria currently, continue ACE/ARB ?  On Entresto and Farxiga.  Following with nephrology regularly.  Urine microalbumin creatinine ratio elevated 1746 in September 2024 reviewed nephrology note. Last  Lab Results  Component  Value Date   MICRALBCREAT 110.7 (H) 07/23/2020    # Foot check nightly.  # Annual dilated diabetic eye exams.   - Diet: Make healthy diabetic food choices - Life style / activity / exercise: Discussed.  2. Blood pressure  -  BP Readings from Last 1 Encounters:  08/08/23 130/70    - Control is in target.  - No change in current plans.  3. Lipid status / Hyperlipidemia - Last  Lab Results  Component Value Date   LDLCALC 60 09/02/2022   - Continue simvastatin  40 mg daily.  Fenofibrate  48 mg daily.  Diagnoses and all orders for this visit:  Type 2 diabetes mellitus with hyperglycemia, with long-term current use of insulin  (HCC) -     POCT glycosylated hemoglobin (Hb A1C)  Controlled type 2 diabetes mellitus with complication, with long-term current use of insulin  (HCC) -     insulin  degludec (TRESIBA  FLEXTOUCH) 200 UNIT/ML FlexTouch Pen; Inject 90 Units into the skin daily.   DISPOSITION Follow up in clinic in 4 months suggested.   All questions answered and patient verbalized understanding of the plan.  [place] Chrissie Dacquisto, MD [facility] Endocrinology [facility] [address] [place], [address] [place], [place] [address] Phone # [phone]  At least part of this note was generated using voice recognition software. Inadvertent word errors may have occurred, which were not recognized  during the proofreading process.

## 2023-08-08 NOTE — Patient Instructions (Signed)
 Latest Reference Range & Units 11/25/22 08:59 03/24/23 10:22 08/08/23 10:15  Hemoglobin A1C 4.0 - 5.6 % 6.6 (H) 6.5 (H) 6.2 !  (H): Data is abnormally high !: Data is abnormal

## 2023-09-13 ENCOUNTER — Other Ambulatory Visit: Payer: Self-pay | Admitting: Physician Assistant

## 2023-09-13 DIAGNOSIS — D500 Iron deficiency anemia secondary to blood loss (chronic): Secondary | ICD-10-CM

## 2023-09-14 ENCOUNTER — Inpatient Hospital Stay (HOSPITAL_BASED_OUTPATIENT_CLINIC_OR_DEPARTMENT_OTHER): Payer: Commercial Managed Care - HMO | Admitting: Physician Assistant

## 2023-09-14 ENCOUNTER — Inpatient Hospital Stay: Payer: Commercial Managed Care - HMO | Attending: Hematology and Oncology

## 2023-09-14 VITALS — BP 156/88 | HR 86 | Temp 98.1°F | Resp 19 | Ht 71.0 in | Wt 293.6 lb

## 2023-09-14 DIAGNOSIS — D500 Iron deficiency anemia secondary to blood loss (chronic): Secondary | ICD-10-CM | POA: Diagnosis not present

## 2023-09-14 DIAGNOSIS — I10 Essential (primary) hypertension: Secondary | ICD-10-CM | POA: Insufficient documentation

## 2023-09-14 DIAGNOSIS — Z79899 Other long term (current) drug therapy: Secondary | ICD-10-CM | POA: Diagnosis not present

## 2023-09-14 DIAGNOSIS — F17210 Nicotine dependence, cigarettes, uncomplicated: Secondary | ICD-10-CM | POA: Diagnosis not present

## 2023-09-14 LAB — CBC WITH DIFFERENTIAL (CANCER CENTER ONLY)
Abs Immature Granulocytes: 0.02 10*3/uL (ref 0.00–0.07)
Basophils Absolute: 0 10*3/uL (ref 0.0–0.1)
Basophils Relative: 0 %
Eosinophils Absolute: 0.6 10*3/uL — ABNORMAL HIGH (ref 0.0–0.5)
Eosinophils Relative: 7 %
HCT: 26.4 % — ABNORMAL LOW (ref 39.0–52.0)
Hemoglobin: 8.1 g/dL — ABNORMAL LOW (ref 13.0–17.0)
Immature Granulocytes: 0 %
Lymphocytes Relative: 10 %
Lymphs Abs: 0.9 10*3/uL (ref 0.7–4.0)
MCH: 22.8 pg — ABNORMAL LOW (ref 26.0–34.0)
MCHC: 30.7 g/dL (ref 30.0–36.0)
MCV: 74.4 fL — ABNORMAL LOW (ref 80.0–100.0)
Monocytes Absolute: 0.5 10*3/uL (ref 0.1–1.0)
Monocytes Relative: 5 %
Neutro Abs: 6.4 10*3/uL (ref 1.7–7.7)
Neutrophils Relative %: 78 %
Platelet Count: 394 10*3/uL (ref 150–400)
RBC: 3.55 MIL/uL — ABNORMAL LOW (ref 4.22–5.81)
RDW: 18.1 % — ABNORMAL HIGH (ref 11.5–15.5)
WBC Count: 8.4 10*3/uL (ref 4.0–10.5)
nRBC: 0 % (ref 0.0–0.2)

## 2023-09-14 LAB — FERRITIN: Ferritin: 25 ng/mL (ref 24–336)

## 2023-09-14 LAB — IRON AND IRON BINDING CAPACITY (CC-WL,HP ONLY)
Iron: 39 ug/dL — ABNORMAL LOW (ref 45–182)
Saturation Ratios: 15 % — ABNORMAL LOW (ref 17.9–39.5)
TIBC: 256 ug/dL (ref 250–450)
UIBC: 217 ug/dL (ref 117–376)

## 2023-09-14 NOTE — Progress Notes (Signed)
 [facility] [phone]   [phone]  PROGRESS NOTE  Patient Care Team: Austin Hu, MD as PCP - General (Internal Medicine)  Hematological/Oncological History 12/21/2021-12/23/2021: Admitted due to acute iron  deficiency anemia with Hgb 6.7 and hemoccult test positive. He was given 3 units of PRBC and IV ferrlecit 250 mg x 2 doses. CT scan did show mildly nodular hepatic contour concerning for cirrhosis. Capsular endoscopy showed portal gastropathy and two small bowel bleeding sites. Hgb improved to 8.0 at discharge. 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  03/19/2022: Establish care with [facility] Hematology 03/30/2022-04/26/2022: Received IV venofer  200 mg x 5 doses 07/05/2022-08/02/2022: Received IV venofer  200 mg x 5 doses  CHIEF COMPLAINTS/PURPOSE OF CONSULTATION:  Iron  deficiency anemia  HISTORY OF PRESENTING ILLNESS:  Austin Alexander 53 y.o. male returns for a follow up for iron  deficiency anemia. He was last seen by Austin Burton NP on 03/21/2023. In the interim, he denies any changes to his health. He is unaccompanied for this visit.   On exam today, Austin Alexander reports his energy levels are overall stable. He has occasional episodes of fatigue which does not interfere with his ADLs. He continues to take is iron  pills as prescribed without any difficulty. He denies any overt signs of bleeding including hematochezia or melena. He denies fevers, chills, night sweats, shortness of breath, chest pain, cough, nausea, vomiting, diarrhea or constipation. He has no other complaints. Rest of the 10 point ROS is below.   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   Erectile dysfunction    Multifactorial. Improved with Cialis. New onset.   H/O folliculitis    Heartburn    Herpes exposure    History of herpetic exposure   Hyperlipidemia    Hypertension    variable in nature   Indigestion    Mild obesity    Multiple excoriations    multifactorial in origin   Situational stress    secondary to financial issues   Tobacco abuse     SURGICAL HISTORY: Past Surgical History:  Procedure Laterality Date   BALLOON ENTEROSCOPY  12/28/2021   Procedure: BALLOON ENTEROSCOPY;  Surgeon: Austin Kinder, DO;  Location: [facility];  Service: Gastroenterology;;   ENTEROSCOPY N/A 12/28/2021   Procedure: SINGLE BALLOON ENTEROSCOPY;  Surgeon: Austin Kinder, DO;  Location: [facility];  Service: Gastroenterology;  Laterality: N/A;   GIVENS CAPSULE STUDY N/A 12/22/2021   Procedure: GIVENS CAPSULE STUDY;  Surgeon: Austin Blunt, MD;  Location: [facility];  Service: Gastroenterology;  Laterality: N/A;   HOT HEMOSTASIS N/A 12/28/2021   Procedure: HOT HEMOSTASIS (ARGON PLASMA COAGULATION/BICAP);  Surgeon: Austin Kinder, DO;  Location: [facility];  Service: Gastroenterology;  Laterality: N/A;   I & D EXTREMITY  Left 06/28/2014   Procedure: IRRIGATION AND DEBRIDEMENT EXTREMITY;  Surgeon: Austin Capra, MD;  Location: [facility] OR;  Service: Orthopedics;  Laterality: Left;   INCISION AND DRAINAGE PERIRECTAL ABSCESS N/A 02/17/2014   Procedure: IRRIGATION AND DEBRIDEMENT PERIRECTAL  AND SCROTAL ABSCESS;  Surgeon: Austin Blumenthal, MD;  Location: [facility] OR;  Service: General;  Laterality: N/A;   MINOR IRRIGATION AND DEBRIDEMENT OF WOUND Left  06/20/2015   Procedure: IRRIGATION AND DEBRIDEMENT Wound with nailbed repair;  Surgeon: Austin Capra, MD;  Location: [facility];  Service: Orthopedics;  Laterality: Left;   SUBMUCOSAL TATTOO INJECTION  12/28/2021   Procedure: SUBMUCOSAL TATTOO INJECTION;  Surgeon: Austin Kinder, DO;  Location: [facility];  Service: Gastroenterology;;    SOCIAL HISTORY: Social History   Socioeconomic History   Marital status: Single    Spouse name: Not on file   Number of children: Not on file   Years of education: Not on file   Highest education level: Not on file  Occupational History   Not on file  Tobacco Use   Smoking status: Every Day    Current packs/day: 1.00    Average packs/day: 1 pack/day for 20.0 years (20.0 ttl pk-yrs)    Types: Cigarettes   Smokeless tobacco: Never  Vaping Use   Vaping status: Never Used  Substance and Sexual Activity   Alcohol use: Not Currently    Alcohol/week: 12.0 standard drinks of alcohol    Types: 10 Cans of beer, 2 Shots of liquor per week    Comment: As of 04/06/14 - states occasional use   Drug use: No   Sexual activity: Yes  Other Topics Concern   Not on file  Social History Narrative   Not on file   Social Drivers of Health   Financial Resource Strain: Not on file  Food Insecurity: No Food Insecurity (12/22/2021)   Hunger Vital Sign    Worried About Running Out of Food in the Last Year: Never true    Ran Out of Food in the Last Year: Never true  Transportation Needs: No Transportation Needs (12/22/2021)   PRAPARE - Administrator, Civil Service (Medical): No    Lack of Transportation (Non-Medical): No  Physical Activity: Not on file  Stress: Not on file  Social Connections: Not on file  Intimate Partner Violence: Not At Risk (12/22/2021)   Humiliation, Afraid, Rape, and Kick questionnaire    Fear of Current or Ex-Partner: No    Emotionally Abused: No    Physically Abused: No    Sexually Abused: No    FAMILY  HISTORY: Family History  Problem Relation Age of Onset   Diabetes type II Mother    Hypertension Mother    Iron  deficiency Mother    Diabetes Other     ALLERGIES:  has no known allergies.  MEDICATIONS:  Current Outpatient Medications  Medication Sig Dispense Refill   B-D ULTRAFINE III SHORT PEN 31G X 8 MM MISC SMARTSIG:Pre-Filled Pen Syringe SUB-Q Daily     blood glucose meter kit and supplies Dispense based on patient and insurance preference. Use up to four times daily as directed. (FOR ICD-9 250.00, 250.01). 1 each 0   carvedilol  (COREG ) 25 MG tablet Take 25 mg by mouth 2 (two) times daily with a meal.     Continuous Blood Gluc Receiver (DEXCOM G7 RECEIVER) DEVI USE TO CHECK BLOOD SUGAR DAILY 1 each 0   Continuous Blood Gluc Transmit (DEXCOM G6 TRANSMITTER) MISC Change every 90  days 3 each 1   Continuous Glucose Sensor (DEXCOM G7 SENSOR) MISC Change every 10 days 6 each 3   dapagliflozin propanediol (FARXIGA) 10 MG TABS tablet Take by mouth daily.     digoxin  (LANOXIN ) 0.125 MG tablet Take 125 mcg by mouth daily.     doxycycline  (VIBRA -TABS) 100 MG tablet Take 100 mg by mouth daily.     Dulaglutide  (TRULICITY ) 3 MG/0.5ML SOAJ Inject 3 mg into the skin once a week. Pharmacy Patient Advocate Encounter  Received notification from [employer] that Prior Authorization for Trulicity  has been APPROVED through 12/06/2023  PA #/Case ID/Reference #: [id number] 6 mL 3   ENTRESTO 49-51 MG Take 1 tablet by mouth 2 (two) times daily.     HUMALOG  KWIKPEN 100 UNIT/ML KwikPen INJECT 0- 10 UNITS with meals, as needed as instructed, maximum 30 units/day. 15 mL 4   insulin  degludec (TRESIBA  FLEXTOUCH) 200 UNIT/ML FlexTouch Pen Inject 90 Units into the skin daily. 15 mL 11   pantoprazole  (PROTONIX ) 40 MG tablet Take 1 tablet (40 mg total) by mouth daily. 30 tablet 1   simvastatin  (ZOCOR ) 40 MG tablet Take 1 tablet (40 mg total) by mouth daily. 30 tablet 0   fenofibrate  (TRICOR ) 48 MG tablet Take 1 tablet (48 mg  total) by mouth daily. (Patient not taking: Reported on 09/14/2023) 90 tablet 1   No current facility-administered medications for this visit.    REVIEW OF SYSTEMS:   Constitutional: ( - ) fevers, ( - )  chills , ( - ) night sweats Eyes: ( - ) blurriness of vision, ( - ) double vision, ( - ) watery eyes Ears, nose, mouth, throat, and face: ( - ) mucositis, ( - ) sore throat Respiratory: ( - ) cough, ( - ) dyspnea, ( - ) wheezes Cardiovascular: ( - ) palpitation, ( - ) chest discomfort, ( - ) lower extremity swelling Gastrointestinal:  ( - ) nausea, ( - ) heartburn, ( - ) change in bowel habits Skin: ( - ) abnormal skin rashes Lymphatics: ( - ) new lymphadenopathy, ( - ) easy bruising Neurological: ( - ) numbness, ( - ) tingling, ( - ) new weaknesses Behavioral/Psych: ( - ) mood change, ( - ) new changes  All other systems were reviewed with the patient and are negative.  PHYSICAL EXAMINATION: ECOG PERFORMANCE STATUS: 0 - Asymptomatic  Vitals:   09/14/23 1105  BP: (!) 156/88  Pulse: 86  Resp: 19  Temp: 98.1 F (36.7 C)  SpO2: 95%   Filed Weights   09/14/23 1105  Weight: 293 lb 9.6 oz (133.2 kg)    GENERAL: well appearing male in NAD  SKIN: skin color, texture, turgor are normal, no rashes or significant lesions EYES: conjunctiva are pink and non-injected, sclera clear LUNGS: clear to auscultation and percussion with normal breathing effort HEART: regular rate & rhythm and no murmurs and no lower extremity edema Musculoskeletal: no cyanosis of digits and no clubbing  PSYCH: alert & oriented x 3, fluent speech NEURO: no focal motor/sensory deficits  LABORATORY DATA:  I have reviewed the data as listed    Latest Ref Rng & Units 09/14/2023   10:47 AM 06/15/2023   11:29 AM 03/16/2023   11:04 AM  CBC  WBC 4.0 - 10.5 K/uL 8.4  9.1  9.5   Hemoglobin 13.0 - 17.0 g/dL 8.1  8.1  9.0   Hematocrit 39.0 - 52.0 % 26.4  26.8  28.8   Platelets 150 - 400  K/uL 394  420  391         Latest Ref Rng & Units 06/15/2023   11:29 AM 03/24/2023   10:22 AM 11/25/2022    8:59 AM  CMP  Glucose 70 - 99 mg/dL 77  70  601   BUN 6 - 20 mg/dL 29  22    Creatinine 0.93 - 1.24 mg/dL 2.35  5.73    Sodium 220 - 145 mmol/L 133  133    Potassium 3.5 - 5.1 mmol/L 5.3  5.3    Chloride 98 - 111 mmol/L 102  98    CO2 22 - 32 mmol/L 26  27    Calcium 8.9 - 10.3 mg/dL 8.6  9.0    Total Protein 6.5 - 8.1 g/dL 8.2     Total Bilirubin 0.0 - 1.2 mg/dL 0.3     Alkaline Phos 38 - 126 U/L 80     AST 15 - 41 U/L 8     ALT 0 - 44 U/L 8       ASSESSMENT & PLAN Austin Alexander is a 53 y.o. male returns for a follow up for iron  deficiency anemia.   #Iron  deficiency anemia 2/2 GI bleed: --Underwent capsular endoscopy on 12/22/2021 that showed probable portal gastropathy and two small bowel bleeding sites. 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  --Patient is under the care of Dr. Veronda Goody, last seen on 07/26/2022.  --Last received IV venofer  200 mg x 5 doses from 07/05/2022-08/02/2022  --Currently on ferrous sulfate 325 mg once daily.  --Labs today shows worsening anemia with Hgb 8.1, MCV 74.4. Iron  panel shows deficiency with ferritin 25, saturation 15%, serum iron  39.  --Recommend IV iron  to bolster levels. Patient was encouraged to call back [facility] so they can schedule his infusion (Message was sent on 08/22/2023 to schedule).  -- Continue on ferrous sulfate 325 mg once daily.  --RTC in 8 weeks for labs only and 16 weeks labs/follow up  #Possible cirrhosis: --Noted on abdominal US  from 10/08/2023 --Encouraged patient to follow up with is GI team for further evaluation.   #Hypertension:  --Patient is on carvedilol  25 mg BID --Encouraged smoking cessation --Patient is asymptomatic without any headaches, dizziness, etc --Advised to monitor BP at home daily and follow up with PCP    No orders of the defined types were  placed in this encounter.   All questions were answered. The patient knows to call the clinic with any problems, questions or concerns.  I have spent a total of 25 minutes minutes of face-to-face and non-face-to-face time, preparing to see the patient, performing a medically appropriate examination, counseling and educating the patient, ordering medications,documenting clinical information in the electronic health record,  and care coordination.   Wyline Hearing, PA-C Department of Hematology/Oncology [facility] Phone: [phone]

## 2023-09-15 ENCOUNTER — Ambulatory Visit: Payer: Self-pay

## 2023-09-15 NOTE — Telephone Encounter (Signed)
-----   Message from Darilyn Edin sent at 09/15/2023 11:03 AM EDT ----- Please notify patient that iron  levels are still low so we will need for patient to receive IV iron . I encouraged him to call [facility] to get the infusions scheduled. ----- Message ----- From: Interface, Lab In [place] Sent: 09/14/2023  11:05 AM EDT To: Darilyn Edin, PA-C

## 2023-09-15 NOTE — Telephone Encounter (Signed)
 Pt advised with VU and will call the [facility] to set up his appt.

## 2023-09-28 ENCOUNTER — Ambulatory Visit (INDEPENDENT_AMBULATORY_CARE_PROVIDER_SITE_OTHER): Admitting: *Deleted

## 2023-09-28 VITALS — BP 138/85 | HR 82 | Temp 98.2°F | Resp 16 | Ht 71.0 in | Wt 292.6 lb

## 2023-09-28 DIAGNOSIS — K922 Gastrointestinal hemorrhage, unspecified: Secondary | ICD-10-CM | POA: Diagnosis not present

## 2023-09-28 DIAGNOSIS — D500 Iron deficiency anemia secondary to blood loss (chronic): Secondary | ICD-10-CM | POA: Diagnosis not present

## 2023-09-28 MED ORDER — SODIUM CHLORIDE 0.9 % IV SOLN
510.0000 mg | Freq: Once | INTRAVENOUS | Status: AC
  Administered 2023-09-28: 510 mg via INTRAVENOUS
  Filled 2023-09-28: qty 17

## 2023-09-28 NOTE — Progress Notes (Signed)
 Diagnosis: Iron  Deficiency Anemia  Provider:  Mannam, Praveen MD  Procedure: IV Infusion  IV Type: Peripheral, IV Location: L Forearm  Feraheme (Ferumoxytol), Dose: 510 mg  Infusion Start Time: 0949 am  Infusion Stop Time: 1012 am  Post Infusion IV Care: Observation period completed  Discharge: Condition: Good, Destination: Home . AVS Declined  Performed by:  Mayme Spearman, RN

## 2023-10-05 ENCOUNTER — Ambulatory Visit

## 2023-10-05 VITALS — BP 147/74 | HR 85 | Temp 98.0°F | Resp 18 | Ht 71.0 in | Wt 292.6 lb

## 2023-10-05 DIAGNOSIS — D500 Iron deficiency anemia secondary to blood loss (chronic): Secondary | ICD-10-CM

## 2023-10-05 DIAGNOSIS — K922 Gastrointestinal hemorrhage, unspecified: Secondary | ICD-10-CM | POA: Diagnosis not present

## 2023-10-05 MED ORDER — SODIUM CHLORIDE 0.9 % IV SOLN
510.0000 mg | Freq: Once | INTRAVENOUS | Status: AC
  Administered 2023-10-05: 510 mg via INTRAVENOUS
  Filled 2023-10-05: qty 17

## 2023-10-05 NOTE — Progress Notes (Signed)
 Diagnosis: Acute Anemia  Provider:  Lonna Coder MD  Procedure: IV Infusion  IV Type: Peripheral, IV Location: L Forearm  Feraheme  (Ferumoxytol ), Dose: 510 mg  Infusion Start Time: 1046  Infusion Stop Time: 1107  Post Infusion IV Care: Patient declined observation and Peripheral IV Discontinued  Discharge: Condition: Good, Destination: Home . AVS Declined  Performed by:  Donny Childes, RN

## 2023-11-09 ENCOUNTER — Inpatient Hospital Stay: Attending: Hematology and Oncology

## 2023-11-09 DIAGNOSIS — D500 Iron deficiency anemia secondary to blood loss (chronic): Secondary | ICD-10-CM | POA: Insufficient documentation

## 2023-11-09 DIAGNOSIS — K922 Gastrointestinal hemorrhage, unspecified: Secondary | ICD-10-CM | POA: Diagnosis not present

## 2023-11-09 LAB — CBC WITH DIFFERENTIAL (CANCER CENTER ONLY)
Abs Immature Granulocytes: 0.02 K/uL (ref 0.00–0.07)
Basophils Absolute: 0 K/uL (ref 0.0–0.1)
Basophils Relative: 0 %
Eosinophils Absolute: 0.6 K/uL — ABNORMAL HIGH (ref 0.0–0.5)
Eosinophils Relative: 7 %
HCT: 32.2 % — ABNORMAL LOW (ref 39.0–52.0)
Hemoglobin: 9.9 g/dL — ABNORMAL LOW (ref 13.0–17.0)
Immature Granulocytes: 0 %
Lymphocytes Relative: 12 %
Lymphs Abs: 1.1 K/uL (ref 0.7–4.0)
MCH: 23.6 pg — ABNORMAL LOW (ref 26.0–34.0)
MCHC: 30.7 g/dL (ref 30.0–36.0)
MCV: 76.8 fL — ABNORMAL LOW (ref 80.0–100.0)
Monocytes Absolute: 0.4 K/uL (ref 0.1–1.0)
Monocytes Relative: 4 %
Neutro Abs: 6.8 K/uL (ref 1.7–7.7)
Neutrophils Relative %: 77 %
Platelet Count: 394 K/uL (ref 150–400)
RBC: 4.19 MIL/uL — ABNORMAL LOW (ref 4.22–5.81)
RDW: 20.7 % — ABNORMAL HIGH (ref 11.5–15.5)
WBC Count: 9 K/uL (ref 4.0–10.5)
nRBC: 0 % (ref 0.0–0.2)

## 2023-11-09 LAB — CMP (CANCER CENTER ONLY)
ALT: 11 U/L (ref 0–44)
AST: 11 U/L — ABNORMAL LOW (ref 15–41)
Albumin: 3.1 g/dL — ABNORMAL LOW (ref 3.5–5.0)
Alkaline Phosphatase: 152 U/L — ABNORMAL HIGH (ref 38–126)
Anion gap: 6 (ref 5–15)
BUN: 26 mg/dL — ABNORMAL HIGH (ref 6–20)
CO2: 26 mmol/L (ref 22–32)
Calcium: 8.6 mg/dL — ABNORMAL LOW (ref 8.9–10.3)
Chloride: 100 mmol/L (ref 98–111)
Creatinine: 2.1 mg/dL — ABNORMAL HIGH (ref 0.61–1.24)
GFR, Estimated: 36 mL/min — ABNORMAL LOW (ref >60)
Glucose, Bld: 131 mg/dL — ABNORMAL HIGH (ref 70–99)
Potassium: 4.7 mmol/L (ref 3.5–5.1)
Sodium: 132 mmol/L — ABNORMAL LOW (ref 135–145)
Total Bilirubin: 0.3 mg/dL (ref 0.0–1.2)
Total Protein: 8.1 g/dL (ref 6.5–8.1)

## 2023-11-09 LAB — IRON AND IRON BINDING CAPACITY (CC-WL,HP ONLY)
Iron: 37 ug/dL — ABNORMAL LOW (ref 45–182)
Saturation Ratios: 19 % (ref 17.9–39.5)
TIBC: 197 ug/dL — ABNORMAL LOW (ref 250–450)
UIBC: 160 ug/dL (ref 117–376)

## 2023-11-09 LAB — FERRITIN: Ferritin: 46 ng/mL (ref 24–336)

## 2023-11-10 ENCOUNTER — Ambulatory Visit: Payer: Self-pay

## 2023-11-10 NOTE — Telephone Encounter (Signed)
 Pt advised of lab results with verbal understanding.  He is a current patient at [facility] and just recently saw Dr Jayson Player.   Lab results and last office note faxed to [facility]/Dr [facility] and confirmation received

## 2023-11-10 NOTE — Telephone Encounter (Signed)
-----   Message from [place] [employer] [employer] sent at 11/10/2023  1:23 PM EDT ----- Please notify patient that iron  levels are normal. His anemia has improved but still below normal. Please inquire if he has a kidney specialist to managed his elevated creatinine levels as this can  also cause his anemia.   ----- Message ----- From: Rebecka, Lab In [place] Sent: 11/09/2023  10:29 AM EDT To: [place] [employer] [employer], PA-C

## 2023-11-24 ENCOUNTER — Other Ambulatory Visit (HOSPITAL_COMMUNITY): Payer: Self-pay

## 2023-11-24 ENCOUNTER — Telehealth: Payer: Self-pay

## 2023-11-24 NOTE — Telephone Encounter (Signed)
 Pharmacy Patient Advocate Encounter   Received notification from CoverMyMeds that prior authorization for Trulicity  3mg  is required/requested.   Insurance verification completed.   The patient is insured through [employer] .   Per test claim: PA required; PA submitted to above mentioned insurance via Latent Key/confirmation #/EOC [facility] Status is pending

## 2023-12-05 NOTE — Telephone Encounter (Signed)
 Pharmacy Patient Advocate Encounter  Received notification from [employer] that Prior Authorization for Trulicity  3MG /0.5ML auto-injectors has been APPROVED from 11/24/23 to 11/23/24   PA #/Case ID/Reference #: [id number]

## 2023-12-08 ENCOUNTER — Ambulatory Visit: Payer: Self-pay | Admitting: Endocrinology

## 2023-12-08 ENCOUNTER — Encounter: Payer: Self-pay | Admitting: Endocrinology

## 2023-12-08 ENCOUNTER — Ambulatory Visit (INDEPENDENT_AMBULATORY_CARE_PROVIDER_SITE_OTHER): Admitting: Endocrinology

## 2023-12-08 VITALS — BP 152/82 | HR 84 | Resp 20 | Ht 71.0 in | Wt 291.8 lb

## 2023-12-08 DIAGNOSIS — Z794 Long term (current) use of insulin: Secondary | ICD-10-CM

## 2023-12-08 DIAGNOSIS — E11649 Type 2 diabetes mellitus with hypoglycemia without coma: Secondary | ICD-10-CM | POA: Diagnosis not present

## 2023-12-08 DIAGNOSIS — E1165 Type 2 diabetes mellitus with hyperglycemia: Secondary | ICD-10-CM | POA: Diagnosis not present

## 2023-12-08 DIAGNOSIS — E118 Type 2 diabetes mellitus with unspecified complications: Secondary | ICD-10-CM

## 2023-12-08 LAB — POCT GLYCOSYLATED HEMOGLOBIN (HGB A1C): Hemoglobin A1C: 6.8 % — AB (ref 4.0–5.6)

## 2023-12-08 MED ORDER — TRESIBA FLEXTOUCH 200 UNIT/ML ~~LOC~~ SOPN: 60.0000 [IU] | PEN_INJECTOR | Freq: Every day | SUBCUTANEOUS | 4 refills | Status: AC

## 2023-12-08 MED ORDER — TRULICITY 3 MG/0.5ML ~~LOC~~ SOAJ: 3.0000 mg | SUBCUTANEOUS | 3 refills | Status: AC

## 2023-12-08 MED ORDER — DEXCOM G7 SENSOR MISC: 3 refills | Status: AC

## 2023-12-08 NOTE — Progress Notes (Signed)
 Outpatient Endocrinology Note Austin Queenie Aufiero, MD   Patient's Name: Austin Alexander    DOB: Oct 01, 1970    MRN: [id number]                                                    REASON OF VISIT: Follow up for type 2 diabetes mellitus  PCP: Addie Camellia CROME, MD  HISTORY OF PRESENT ILLNESS:   Austin Alexander is a 53 y.o. old male with past medical history listed below, is here for follow up for type 2 diabetes mellitus.   Pertinent Diabetes History: Patient was previously seen by Dr. Von and was last time seen in August 2024.  Patient was diagnosed with type 2 diabetes mellitus in 2009.  Insulin  therapy was started around 2015.  Chronic Diabetes Complications : Retinopathy: no. Last ophthalmology exam was done on annually, reportedly, following with ophthalmology regularly.  Nephropathy: CKD, on ACE/ARB , nephrology every 4 months.  On Farxiga.  On  Entresto. Peripheral neuropathy: mildly present Coronary artery disease: no Stroke: no  Relevant comorbidities and cardiovascular risk factors: Obesity: yes Body mass index is 40.7 kg/m.  Hypertension: Yes  Hyperlipidemia : Yes, on statin   Current / Home Diabetic regimen includes:  INSULIN  regimen is: Tresiba  90 units daily, taking every other day, Humalog  0-12 units in am, 0-12 units at lunch and 0-12 units dinner, has not been taking Humalog  most likely.   Non-insulin  hypoglycemic drugs the patient is taking are: Farxiga 10 mg daily, Trulicity  3 mg weekly.  Patient preferred to stay on Trulicity  3 mg weekly.  Prior diabetic medications: Glimepiride /Amaryl  and Actos/pioglitazone in the past.  Higher dose of Humalog  in the past.  Jardiance in the past changed to [place] due to cost.  Glycemic data:    CONTINUOUS GLUCOSE MONITORING SYSTEM (CGMS) INTERPRETATION: No recent Dexcom data was downloaded and not able to review.  Hypoglycemia: Patient has no hypoglycemic episodes. Patient has hypoglycemia awareness.  Factors modifying glucose  control: 1.  Diabetic diet assessment: 2-3 meals a day.   2.  Staying active or exercising: Difficulty doing much exercise.  3.  Medication compliance: compliant al of the time.  Interval history  No Dexcom data available to review today.  Not able to download the data for recent dates.  He reports he was having hypoglycemia and taking Tresiba  90 units every other day for the last 1 months.  Hemoglobin A1c today 6.8%.  He has numbness and tingling of the feet and has been following with podiatry for callus treatment.  Denies vision problem.  He is due for diabetic eye exam.  He has been tolerating procedure well.  He has not been requiring Humalog  much lately.  No other complaints today.  REVIEW OF SYSTEMS As per history of present illness.   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   Erectile dysfunction    Multifactorial. Improved with Cialis. New onset.   H/O folliculitis    Heartburn    Herpes exposure    History of herpetic exposure   Hyperlipidemia    Hypertension    variable in nature   Indigestion    Mild obesity    Multiple excoriations    multifactorial in origin   Situational stress    secondary to financial issues   Tobacco abuse     PAST SURGICAL HISTORY: Past Surgical History:  Procedure Laterality Date   BALLOON ENTEROSCOPY  12/28/2021   Procedure: BALLOON ENTEROSCOPY;   Surgeon: San Sandor GAILS, DO;  Location: [facility];  Service: Gastroenterology;;   ENTEROSCOPY N/A 12/28/2021   Procedure: SINGLE BALLOON ENTEROSCOPY;  Surgeon: San Sandor GAILS, DO;  Location: [facility];  Service: Gastroenterology;  Laterality: N/A;   GIVENS CAPSULE STUDY N/A 12/22/2021   Procedure: GIVENS CAPSULE STUDY;  Surgeon: Rosalie Kitchens, MD;  Location: [facility];  Service: Gastroenterology;  Laterality: N/A;   HOT HEMOSTASIS N/A 12/28/2021   Procedure: HOT HEMOSTASIS (ARGON PLASMA COAGULATION/BICAP);  Surgeon: San Sandor GAILS, DO;  Location: [facility];  Service: Gastroenterology;  Laterality: N/A;   I & D EXTREMITY Left 06/28/2014   Procedure: IRRIGATION AND DEBRIDEMENT EXTREMITY;  Surgeon: Franky Curia, MD;  Location: [facility] OR;  Service: Orthopedics;  Laterality: Left;   INCISION AND DRAINAGE PERIRECTAL ABSCESS N/A 02/17/2014   Procedure: IRRIGATION AND DEBRIDEMENT PERIRECTAL  AND SCROTAL ABSCESS;  Surgeon: Vicenta Poli, MD;  Location: [facility] OR;  Service: General;  Laterality: N/A;   MINOR IRRIGATION AND DEBRIDEMENT OF WOUND Left 06/20/2015   Procedure: IRRIGATION AND DEBRIDEMENT Wound with nailbed repair;  Surgeon: Franky Curia, MD;  Location: [facility];  Service: Orthopedics;  Laterality: Left;   SUBMUCOSAL TATTOO INJECTION  12/28/2021   Procedure: SUBMUCOSAL TATTOO INJECTION;  Surgeon: San Sandor GAILS, DO;  Location: [facility];  Service: Gastroenterology;;    ALLERGIES: No Known Allergies  FAMILY HISTORY:  Family History  Problem Relation Age of Onset   Diabetes type II Mother    Hypertension Mother    Iron  deficiency Mother    Diabetes Other     SOCIAL HISTORY: Social History   Socioeconomic History   Marital status: Single    Spouse name: Not on file   Number of children: Not on file   Years of education: Not on file   Highest education level: Not on file  Occupational History   Not on file  Tobacco Use   Smoking status: Every Day     Current packs/day: 1.00    Average packs/day: 1 pack/day for 20.0 years (20.0 ttl pk-yrs)    Types: Cigarettes   Smokeless tobacco: Never  Vaping Use   Vaping status: Never Used  Substance and Sexual Activity   Alcohol use: Not Currently    Alcohol/week: 12.0 standard drinks of alcohol    Types: 10 Cans of beer, 2 Shots of liquor per week    Comment: As of 04/06/14 - states occasional use   Drug use: No   Sexual activity: Yes  Other Topics Concern   Not on file  Social History Narrative   Not on file   Social Drivers of Health   Financial Resource Strain: Not on file  Food  Insecurity: No Food Insecurity (12/22/2021)   Hunger Vital Sign    Worried About Running Out of Food in the Last Year: Never true    Ran Out of Food in the Last Year: Never true  Transportation Needs: No Transportation Needs (12/22/2021)   PRAPARE - Administrator, Civil Service (Medical): No    Lack of Transportation (Non-Medical): No  Physical Activity: Not on file  Stress: Not on file  Social Connections: Not on file    MEDICATIONS:  Current Outpatient Medications  Medication Sig Dispense Refill   B-D ULTRAFINE III SHORT PEN 31G X 8 MM MISC SMARTSIG:Pre-Filled Pen Syringe SUB-Q Daily     blood glucose meter kit and supplies Dispense based on patient and insurance preference. Use up to four times daily as directed. (FOR ICD-9 250.00, 250.01). 1 each 0   carvedilol  (COREG ) 25 MG tablet Take 25 mg by mouth 2 (two) times daily with a meal.     dapagliflozin propanediol (FARXIGA) 10 MG TABS tablet Take by mouth daily.     digoxin  (LANOXIN ) 0.125 MG tablet Take 125 mcg by mouth daily.     doxycycline  (VIBRA -TABS) 100 MG tablet Take 100 mg by mouth daily.     ENTRESTO 49-51 MG Take 1 tablet by mouth 2 (two) times daily.     HUMALOG  KWIKPEN 100 UNIT/ML KwikPen INJECT 0- 10 UNITS with meals, as needed as instructed, maximum 30 units/day. 15 mL 4   pantoprazole  (PROTONIX ) 40 MG tablet Take 1  tablet (40 mg total) by mouth daily. 30 tablet 1   simvastatin  (ZOCOR ) 40 MG tablet Take 1 tablet (40 mg total) by mouth daily. 30 tablet 0   Continuous Glucose Sensor (DEXCOM G7 SENSOR) MISC Change every 10 days 6 each 3   Dulaglutide  (TRULICITY ) 3 MG/0.5ML SOAJ Inject 3 mg into the skin once a week. Pharmacy Patient Advocate Encounter  Received notification from [employer] that Prior Authorization for Trulicity  has been APPROVED through 12/06/2023  PA #/Case ID/Reference #: [id number] 6 mL 3   insulin  degludec (TRESIBA  FLEXTOUCH) 200 UNIT/ML FlexTouch Pen Inject 60 Units into the skin daily. 30 mL 4   No current facility-administered medications for this visit.    PHYSICAL EXAM: Vitals:   12/08/23 1017 12/08/23 1018  BP: (!) 158/82 (!) 152/82  Pulse: 84   Resp: 20   SpO2: 96%   Weight: 291 lb 12.8 oz (132.4 kg)   Height: 5' 11 (1.803 m)     Body mass index is 40.7 kg/m.  Wt Readings from Last 3 Encounters:  12/08/23 291 lb 12.8 oz (132.4 kg)  10/05/23 292 lb 9.6 oz (132.7 kg)  09/28/23 292 lb 9.6 oz (132.7 kg)    General: Well developed, well nourished male in no apparent distress.  HEENT: AT/[place], no external lesions.  Eyes: Conjunctiva clear and no icterus. Neck: Neck supple  Lungs: Respirations not labored Neurologic: Alert, oriented, normal speech Extremities / Skin: Dry.  Psychiatric: Does not appear depressed or anxious   Diabetic Foot Exam - Simple   Simple Foot Form Diabetic Foot exam was performed with the following findings: Yes 12/08/2023 10:32 AM  Visual Inspection See comments: Yes Sensation Testing See comments: Yes Pulse Check Posterior Tibialis and Dorsalis pulse intact bilaterally: Yes Comments No ulcer. Right great toe callus. Hammer toes deformity and flat soles bilaterally. Monofilament significantly diminished bilaterally.      LABS Reviewed Lab Results  Component Value Date   HGBA1C 6.8 (A) 12/08/2023  HGBA1C 6.2 (A) 08/08/2023   HGBA1C 6.5 (H)  03/24/2023   Lab Results  Component Value Date   FRUCTOSAMINE 249 01/25/2022   FRUCTOSAMINE 217 02/19/2020   FRUCTOSAMINE 346 (H) 07/20/2019   Lab Results  Component Value Date   CHOL 124 09/02/2022   HDL 31.40 (L) 09/02/2022   LDLCALC 60 09/02/2022   LDLDIRECT 76.0 01/25/2022   TRIG 163.0 (H) 09/02/2022   CHOLHDL 4 09/02/2022   No results found for: [facility]  Lab Results  Component Value Date   CREATININE 2.10 (H) 11/09/2023   Lab Results  Component Value Date   GFR 42.56 (L) 09/02/2022    ASSESSMENT / PLAN  1. Controlled type 2 diabetes mellitus with complication, with long-term current use of insulin  (HCC)   2. Uncontrolled type 2 diabetes mellitus with hyperglycemia, with long-term current use of insulin  (HCC)   3. Type 2 diabetes mellitus with hypoglycemia without coma, with long-term current use of insulin  (HCC)     Diabetes Mellitus type 2, complicated by CKD - Diabetic status / severity: Fair control.  Lab Results  Component Value Date   HGBA1C 6.8 (A) 12/08/2023   GMI on CGM 6.3%.  - Hemoglobin A1c goal : <6.5%  Diabetes regimen as reviewed above.  Patient has been taking Tresiba  90 units every other day due to hypoglycemia.  Adjusted diabetes regimen as follows.  - Medications: See below.  I) decrease Tresiba  from 90 to 60 units daily.  Discussed that he can decrease Tresiba  by 5 units every 3 to 4 days if he still has hypoglycemia after taking every day and can decrease up to 50 units daily.  II) continue Trulicity  3 mg weekly. III) continue Humalog  as needed, discussed instruction when to use Humalog .   IV) Farxiga 10 mg daily.  - Home glucose testing: CGM/Dexcom G7 and check as needed.  - Discussed/ Gave Hypoglycemia treatment plan.  # Consult : not required at this time.   # Annual urine for microalbuminuria/ creatinine ratio, + microalbuminuria currently, continue ACE/ARB ?  On Entresto and Farxiga.  Following with nephrology  regularly.  Urine microalbumin creatinine ratio elevated 1746 in September 2024 reviewed nephrology note.  He wants to check urine microalbumin creatinine ratio in current follow-up visit next month. Last  No results found for: MICRALBCREAT   # Foot check nightly.  # Annual dilated diabetic eye exams.   - Diet: Make healthy diabetic food choices - Life style / activity / exercise: Discussed.  2. Blood pressure  -  BP Readings from Last 1 Encounters:  12/08/23 (!) 152/82    - Control is in target.  - No change in current plans.  3. Lipid status / Hyperlipidemia - Last  Lab Results  Component Value Date   LDLCALC 60 09/02/2022   - Continue simvastatin  40 mg daily.  Fenofibrate  48 mg daily.  Managed by primary care provider.  Diagnoses and all orders for this visit:  Controlled type 2 diabetes mellitus with complication, with long-term current use of insulin  (HCC) -     POCT glycosylated hemoglobin (Hb A1C) -     insulin  degludec (TRESIBA  FLEXTOUCH) 200 UNIT/ML FlexTouch Pen; Inject 60 Units into the skin daily.  Uncontrolled type 2 diabetes mellitus with hyperglycemia, with long-term current use of insulin  (HCC) -     Continuous Glucose Sensor (DEXCOM G7 SENSOR) MISC; Change every 10 days  Type 2 diabetes mellitus with hypoglycemia without coma, with long-term current use of insulin  (HCC) -  Dulaglutide  (TRULICITY ) 3 MG/0.5ML SOAJ; Inject 3 mg into the skin once a week. Pharmacy Patient Advocate Encounter  Received notification from [employer] that Prior Authorization for Trulicity  has been APPROVED through 12/06/2023  PA #/Case ID/Reference #: [id number]   DISPOSITION Follow up in clinic in 4 months suggested.   All questions answered and patient verbalized understanding of the plan.  Austin Grettell Ransdell, MD [facility] Endocrinology [facility] [address] [place], [address] [place], [place] [address] Phone # [phone]  At least part of this note was generated using  voice recognition software. Inadvertent word errors may have occurred, which were not recognized during the proofreading process.

## 2023-12-08 NOTE — Patient Instructions (Signed)
 Decrease Tresiba  60 units daily. Take daily. Continue Trulicity  3 mg weekly. Continue Humalog  as needed. Continue Farxiga 10 mg daily.

## 2024-01-03 ENCOUNTER — Other Ambulatory Visit: Payer: Self-pay | Admitting: Physician Assistant

## 2024-01-03 DIAGNOSIS — D500 Iron deficiency anemia secondary to blood loss (chronic): Secondary | ICD-10-CM

## 2024-01-04 ENCOUNTER — Inpatient Hospital Stay: Attending: Hematology and Oncology

## 2024-01-04 ENCOUNTER — Inpatient Hospital Stay (HOSPITAL_BASED_OUTPATIENT_CLINIC_OR_DEPARTMENT_OTHER): Admitting: Physician Assistant

## 2024-01-04 ENCOUNTER — Other Ambulatory Visit: Payer: Self-pay

## 2024-01-04 VITALS — BP 170/90 | HR 85 | Temp 98.2°F | Resp 15 | Wt 293.5 lb

## 2024-01-04 DIAGNOSIS — F17210 Nicotine dependence, cigarettes, uncomplicated: Secondary | ICD-10-CM | POA: Diagnosis not present

## 2024-01-04 DIAGNOSIS — I129 Hypertensive chronic kidney disease with stage 1 through stage 4 chronic kidney disease, or unspecified chronic kidney disease: Secondary | ICD-10-CM | POA: Diagnosis not present

## 2024-01-04 DIAGNOSIS — K922 Gastrointestinal hemorrhage, unspecified: Secondary | ICD-10-CM | POA: Insufficient documentation

## 2024-01-04 DIAGNOSIS — N189 Chronic kidney disease, unspecified: Secondary | ICD-10-CM | POA: Diagnosis not present

## 2024-01-04 DIAGNOSIS — D500 Iron deficiency anemia secondary to blood loss (chronic): Secondary | ICD-10-CM | POA: Insufficient documentation

## 2024-01-04 LAB — CBC WITH DIFFERENTIAL (CANCER CENTER ONLY)
Abs Immature Granulocytes: 0.02 K/uL (ref 0.00–0.07)
Basophils Absolute: 0 K/uL (ref 0.0–0.1)
Basophils Relative: 0 %
Eosinophils Absolute: 0.6 K/uL — ABNORMAL HIGH (ref 0.0–0.5)
Eosinophils Relative: 7 %
HCT: 29.7 % — ABNORMAL LOW (ref 39.0–52.0)
Hemoglobin: 9.3 g/dL — ABNORMAL LOW (ref 13.0–17.0)
Immature Granulocytes: 0 %
Lymphocytes Relative: 12 %
Lymphs Abs: 0.9 K/uL (ref 0.7–4.0)
MCH: 24.1 pg — ABNORMAL LOW (ref 26.0–34.0)
MCHC: 31.3 g/dL (ref 30.0–36.0)
MCV: 76.9 fL — ABNORMAL LOW (ref 80.0–100.0)
Monocytes Absolute: 0.4 K/uL (ref 0.1–1.0)
Monocytes Relative: 5 %
Neutro Abs: 6.1 K/uL (ref 1.7–7.7)
Neutrophils Relative %: 76 %
Platelet Count: 375 K/uL (ref 150–400)
RBC: 3.86 MIL/uL — ABNORMAL LOW (ref 4.22–5.81)
RDW: 19.5 % — ABNORMAL HIGH (ref 11.5–15.5)
WBC Count: 8 K/uL (ref 4.0–10.5)
nRBC: 0 % (ref 0.0–0.2)

## 2024-01-04 LAB — IRON AND IRON BINDING CAPACITY (CC-WL,HP ONLY)
Iron: 28 ug/dL — ABNORMAL LOW (ref 45–182)
Saturation Ratios: 13 % — ABNORMAL LOW (ref 17.9–39.5)
TIBC: 223 ug/dL — ABNORMAL LOW (ref 250–450)
UIBC: 195 ug/dL (ref 117–376)

## 2024-01-04 LAB — CMP (CANCER CENTER ONLY)
ALT: 10 U/L (ref 0–44)
AST: 11 U/L — ABNORMAL LOW (ref 15–41)
Albumin: 3.1 g/dL — ABNORMAL LOW (ref 3.5–5.0)
Alkaline Phosphatase: 157 U/L — ABNORMAL HIGH (ref 38–126)
Anion gap: 5 (ref 5–15)
BUN: 26 mg/dL — ABNORMAL HIGH (ref 6–20)
CO2: 28 mmol/L (ref 22–32)
Calcium: 8.3 mg/dL — ABNORMAL LOW (ref 8.9–10.3)
Chloride: 104 mmol/L (ref 98–111)
Creatinine: 2.28 mg/dL — ABNORMAL HIGH (ref 0.61–1.24)
GFR, Estimated: 33 mL/min — ABNORMAL LOW (ref >60)
Glucose, Bld: 84 mg/dL (ref 70–99)
Potassium: 4.4 mmol/L (ref 3.5–5.1)
Sodium: 137 mmol/L (ref 135–145)
Total Bilirubin: 0.4 mg/dL (ref 0.0–1.2)
Total Protein: 8 g/dL (ref 6.5–8.1)

## 2024-01-04 LAB — RETIC PANEL
Immature Retic Fract: 26 % — ABNORMAL HIGH (ref 2.3–15.9)
RBC.: 3.85 MIL/uL — ABNORMAL LOW (ref 4.22–5.81)
Retic Count, Absolute: 68.5 K/uL (ref 19.0–186.0)
Retic Ct Pct: 1.8 % (ref 0.4–3.1)
Reticulocyte Hemoglobin: 24.7 pg — ABNORMAL LOW (ref >27.9)

## 2024-01-04 LAB — FERRITIN: Ferritin: 32 ng/mL (ref 24–336)

## 2024-01-04 NOTE — Progress Notes (Signed)
 [facility] [phone]   [phone]  PROGRESS NOTE  Patient Care Team: Addie Camellia CROME, MD as PCP - General (Internal Medicine)  Hematological/Oncological History 12/21/2021-12/23/2021: Admitted due to acute iron  deficiency anemia with Hgb 6.7 and hemoccult test positive. He was given 3 units of PRBC and IV ferrlecit 250 mg x 2 doses. CT scan did show mildly nodular hepatic contour concerning for cirrhosis. Capsular endoscopy showed portal gastropathy and two small bowel bleeding sites. Hgb improved to 8.0 at discharge. 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  03/19/2022: Establish care with [facility] Hematology 03/30/2022-04/26/2022: Received IV venofer  200 mg x 5 doses 07/05/2022-08/02/2022: Received IV venofer  200 mg x 5 doses 09/28/2023-10/05/2023: Received IV feraheme  510 mg x 2 doses  CHIEF COMPLAINTS/PURPOSE OF CONSULTATION:  Iron  deficiency anemia  HISTORY OF PRESENTING ILLNESS:  Austin Alexander 53 y.o. male returns for a follow up for iron  deficiency anemia. He was last seen 09/14/2023. In the interim, he received IV feraheme  510 mg x 2 doses.   On exam today, Mr. Cygan reports his energy levels are overall stable. He reports he is eating well without any changes to his weight. He denies nausea, vomiting or bowel habit changes. He reports having dark stools due to iron  supplementation. Otherwise no overt signs of bleeding including hematochezia or melena. He does have some shortness of breath with exertion but none at rest. He denies fevers, chills, night sweats, chest pain or cough. He has no other complaints. Rest of the 10 point ROS is below.   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   Erectile dysfunction    Multifactorial. Improved with Cialis. New onset.   H/O folliculitis    Heartburn    Herpes exposure    History of herpetic exposure   Hyperlipidemia    Hypertension    variable in nature   Indigestion    Mild obesity    Multiple excoriations    multifactorial in origin   Situational stress    secondary to financial issues   Tobacco abuse     SURGICAL HISTORY: Past Surgical History:  Procedure Laterality Date   BALLOON ENTEROSCOPY  12/28/2021   Procedure: BALLOON ENTEROSCOPY;  Surgeon: San Sandor GAILS, DO;  Location: [facility];  Service: Gastroenterology;;   ENTEROSCOPY N/A 12/28/2021   Procedure: SINGLE BALLOON ENTEROSCOPY;  Surgeon: San Sandor GAILS, DO;  Location: [facility];  Service: Gastroenterology;  Laterality: N/A;   GIVENS CAPSULE STUDY N/A 12/22/2021   Procedure: GIVENS CAPSULE STUDY;  Surgeon: Rosalie Kitchens, MD;  Location: [facility] ENDOSCOPY;  Service: Gastroenterology;  Laterality: N/A;   HOT HEMOSTASIS N/A 12/28/2021   Procedure: HOT HEMOSTASIS (ARGON PLASMA COAGULATION/BICAP);  Surgeon: San Sandor GAILS, DO;  Location: [facility];  Service: Gastroenterology;  Laterality: N/A;   I & D EXTREMITY Left 06/28/2014   Procedure: IRRIGATION AND DEBRIDEMENT EXTREMITY;  Surgeon: Franky Curia, MD;  Location: [facility] OR;  Service: Orthopedics;  Laterality: Left;   INCISION AND DRAINAGE PERIRECTAL ABSCESS N/A 02/17/2014   Procedure: IRRIGATION AND DEBRIDEMENT PERIRECTAL  AND SCROTAL ABSCESS;  Surgeon: Vicenta Poli, MD;  Location: [facility] OR;  Service: General;  Laterality: N/A;   MINOR IRRIGATION  AND DEBRIDEMENT OF WOUND Left 06/20/2015   Procedure: IRRIGATION AND DEBRIDEMENT Wound with nailbed repair;  Surgeon: Franky Curia, MD;  Location: [facility];  Service: Orthopedics;  Laterality: Left;   SUBMUCOSAL TATTOO INJECTION  12/28/2021   Procedure: SUBMUCOSAL TATTOO INJECTION;  Surgeon: San Sandor GAILS, DO;  Location: [facility];  Service: Gastroenterology;;    SOCIAL HISTORY: Social History   Socioeconomic History   Marital status: Single    Spouse name: Not on file   Number of children: Not on file   Years of education: Not on file   Highest education level: Not on file  Occupational History   Not on file  Tobacco Use   Smoking status: Every Day    Current packs/day: 1.00    Average packs/day: 1 pack/day for 20.0 years (20.0 ttl pk-yrs)    Types: Cigarettes   Smokeless tobacco: Never  Vaping Use   Vaping status: Never Used  Substance and Sexual Activity   Alcohol use: Not Currently    Alcohol/week: 12.0 standard drinks of alcohol    Types: 10 Cans of beer, 2 Shots of liquor per week    Comment: As of 04/06/14 - states occasional use   Drug use: No   Sexual activity: Yes  Other Topics Concern   Not on file  Social History Narrative   Not on file   Social Drivers of Health   Financial Resource Strain: Not on file  Food Insecurity: No Food Insecurity (12/22/2021)   Hunger Vital Sign    Worried About Running Out of Food in the Last Year: Never true    Ran Out of Food in the Last Year: Never true  Transportation Needs: No Transportation Needs (12/22/2021)   PRAPARE - Administrator, Civil Service (Medical): No    Lack of Transportation (Non-Medical): No  Physical Activity: Not on file  Stress: Not on file  Social Connections: Not on file  Intimate Partner Violence: Not At Risk (12/22/2021)   Humiliation, Afraid, Rape, and Kick questionnaire    Fear of Current or Ex-Partner: No    Emotionally Abused: No    Physically Abused: No     Sexually Abused: No    FAMILY HISTORY: Family History  Problem Relation Age of Onset   Diabetes type II Mother    Hypertension Mother    Iron  deficiency Mother    Diabetes Other     ALLERGIES:  has no known allergies.  MEDICATIONS:  Current Outpatient Medications  Medication Sig Dispense Refill   B-D ULTRAFINE III SHORT PEN 31G X 8 MM MISC SMARTSIG:Pre-Filled Pen Syringe SUB-Q Daily     blood glucose meter kit and supplies Dispense based on patient and insurance preference. Use up to four times daily as directed. (FOR ICD-9 250.00, 250.01). 1 each 0   carvedilol  (COREG ) 25 MG tablet Take 25 mg by mouth 2 (two) times daily with a meal.     Continuous Glucose Sensor (DEXCOM G7 SENSOR) MISC Change every 10 days 6 each 3   dapagliflozin propanediol (FARXIGA) 10 MG TABS  tablet Take by mouth daily.     digoxin  (LANOXIN ) 0.125 MG tablet Take 125 mcg by mouth daily.     doxycycline  (VIBRA -TABS) 100 MG tablet Take 100 mg by mouth daily.     Dulaglutide  (TRULICITY ) 3 MG/0.5ML SOAJ Inject 3 mg into the skin once a week. Pharmacy Patient Advocate Encounter  Received notification from [employer] that Prior Authorization for Trulicity  has been APPROVED through 12/06/2023  PA #/Case ID/Reference #: [id number] 6 mL 3   ENTRESTO 49-51 MG Take 1 tablet by mouth 2 (two) times daily.     HUMALOG  KWIKPEN 100 UNIT/ML KwikPen INJECT 0- 10 UNITS with meals, as needed as instructed, maximum 30 units/day. 15 mL 4   insulin  degludec (TRESIBA  FLEXTOUCH) 200 UNIT/ML FlexTouch Pen Inject 60 Units into the skin daily. 30 mL 4   pantoprazole  (PROTONIX ) 40 MG tablet Take 1 tablet (40 mg total) by mouth daily. 30 tablet 1   simvastatin  (ZOCOR ) 40 MG tablet Take 1 tablet (40 mg total) by mouth daily. 30 tablet 0   No current facility-administered medications for this visit.    REVIEW OF SYSTEMS:   Constitutional: ( - ) fevers, ( - )  chills , ( - ) night sweats Eyes: ( - ) blurriness of vision, ( - ) double vision, ( - )  watery eyes Ears, nose, mouth, throat, and face: ( - ) mucositis, ( - ) sore throat Respiratory: ( - ) cough, ( - ) dyspnea, ( - ) wheezes Cardiovascular: ( - ) palpitation, ( - ) chest discomfort, ( - ) lower extremity swelling Gastrointestinal:  ( - ) nausea, ( - ) heartburn, ( - ) change in bowel habits Skin: ( - ) abnormal skin rashes Lymphatics: ( - ) new lymphadenopathy, ( - ) easy bruising Neurological: ( - ) numbness, ( - ) tingling, ( - ) new weaknesses Behavioral/Psych: ( - ) mood change, ( - ) new changes  All other systems were reviewed with the patient and are negative.  PHYSICAL EXAMINATION: ECOG PERFORMANCE STATUS: 0 - Asymptomatic  Vitals:   01/04/24 1106  BP: (!) 170/90  Pulse: 85  Resp: 15  Temp: 98.2 F (36.8 C)  SpO2: 97%   Filed Weights   01/04/24 1106  Weight: 293 lb 8 oz (133.1 kg)    GENERAL: well appearing male in NAD  SKIN: skin color, texture, turgor are normal, no rashes or significant lesions EYES: conjunctiva are pink and non-injected, sclera clear LUNGS: clear to auscultation and percussion with normal breathing effort HEART: regular rate & rhythm and no murmurs and no lower extremity edema Musculoskeletal: no cyanosis of digits and no clubbing  PSYCH: alert & oriented x 3, fluent speech NEURO: no focal motor/sensory deficits  LABORATORY DATA:  I have reviewed the data as listed    Latest Ref Rng & Units 01/04/2024   10:24 AM 11/09/2023   10:17 AM 09/14/2023   10:47 AM  CBC  WBC 4.0 - 10.5 K/uL 8.0  9.0  8.4   Hemoglobin 13.0 - 17.0 g/dL 9.3  9.9  8.1   Hematocrit 39.0 - 52.0 % 29.7  32.2  26.4   Platelets 150 - 400 K/uL 375  394  394        Latest Ref Rng & Units 01/04/2024   10:24 AM 11/09/2023   10:17 AM 06/15/2023   11:29 AM  CMP  Glucose 70 - 99 mg/dL 84  868  77   BUN 6 - 20 mg/dL  26  26  29    Creatinine 0.61 - 1.24 mg/dL 7.71  7.89  7.15   Sodium 135 - 145 mmol/L 137  132  133   Potassium 3.5 - 5.1 mmol/L 4.4  4.7  5.3    Chloride 98 - 111 mmol/L 104  100  102   CO2 22 - 32 mmol/L 28  26  26    Calcium 8.9 - 10.3 mg/dL 8.3  8.6  8.6   Total Protein 6.5 - 8.1 g/dL 8.0  8.1  8.2   Total Bilirubin 0.0 - 1.2 mg/dL 0.4  0.3  0.3   Alkaline Phos 38 - 126 U/L 157  152  80   AST 15 - 41 U/L 11  11  8    ALT 0 - 44 U/L 10  11  8      ASSESSMENT & PLAN Austin Alexander is a 53 y.o. male returns for a follow up for iron  deficiency anemia.   #Iron  deficiency anemia 2/2 GI bleed: --Underwent capsular endoscopy on 12/22/2021 that showed probable portal gastropathy and two small bowel bleeding sites. 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  --Patient is under the care of Dr. Elicia, last seen on 07/26/2022.  --Last received IV feraheme  510 mg x 2 doses from 09/28/23-10/05/23.  --Labs today shows persistent anemia with Hgb 9.3, MCV 76.9. Iron  panel shows deficiency with iron  28, saturation 13%, ferritin 32 --Recommend IV feraheme  x 2 doses -- Continue on ferrous sulfate 325 mg once daily.  --RTC in 8 weeks with labs and follow up  #CKD: --Under the care of Dr. Macel --Will consider Epo injection if there is persistent anemia <10 with no evidence of iron  deficiency.    #Possible cirrhosis: --Noted on abdominal US  from 10/08/2023 --Encouraged patient to follow up with is GI team for further evaluation.   #Hypertension:  --Patient is on carvedilol  25 mg BID --Encouraged smoking cessation --Patient is asymptomatic without any headaches, dizziness, etc --Advised to monitor BP at home daily and follow up with PCP    No orders of the defined types were placed in this encounter.   All questions were answered. The patient knows to call the clinic with any problems, questions or concerns.  I have spent a total of 25 minutes minutes of face-to-face and non-face-to-face time, preparing to see the patient, performing a medically appropriate examination,  counseling and educating the patient, ordering medications,documenting clinical information in the electronic health record,  and care coordination.   Johnston Police, PA-C Department of Hematology/Oncology [facility] Phone: [phone]

## 2024-01-05 ENCOUNTER — Telehealth: Payer: Self-pay

## 2024-01-05 ENCOUNTER — Ambulatory Visit: Payer: Self-pay | Admitting: Physician Assistant

## 2024-01-05 NOTE — Telephone Encounter (Signed)
 Johnston, patient will be scheduled as soon as possible.  Auth Submission: APPROVED Site of care: Site of care: [facility] Payer: [employer] commercial Medication & CPT/J Code(s) submitted: Feraheme  (ferumoxytol ) [id number] Diagnosis Code:  Route of submission (phone, fax, portal): phone Phone # [phone] Fax # Auth type: Buy/Bill PB Units/visits requested: 510mg  x 2 doses Reference number: [id number] Approval from: 01/05/24 to 07/04/24

## 2024-01-17 ENCOUNTER — Ambulatory Visit (INDEPENDENT_AMBULATORY_CARE_PROVIDER_SITE_OTHER)

## 2024-01-17 VITALS — BP 123/71 | HR 77 | Temp 97.8°F | Resp 18 | Ht 70.0 in | Wt 291.6 lb

## 2024-01-17 DIAGNOSIS — K922 Gastrointestinal hemorrhage, unspecified: Secondary | ICD-10-CM

## 2024-01-17 DIAGNOSIS — D500 Iron deficiency anemia secondary to blood loss (chronic): Secondary | ICD-10-CM

## 2024-01-17 MED ORDER — SODIUM CHLORIDE 0.9 % IV SOLN
510.0000 mg | Freq: Once | INTRAVENOUS | Status: AC
  Administered 2024-01-17: 510 mg via INTRAVENOUS
  Filled 2024-01-17: qty 17

## 2024-01-17 NOTE — Patient Instructions (Signed)

## 2024-01-17 NOTE — Progress Notes (Signed)
 Diagnosis: Iron  Deficiency Anemia  Provider:  Praveen Mannam MD  Procedure: IV Infusion  IV Type: Peripheral, IV Location: L Forearm  Feraheme  (Ferumoxytol ), Dose: 510 mg  Infusion Start Time: 0917  Infusion Stop Time: 0941  Post Infusion IV Care: Patient declined observation and Peripheral IV Discontinued  Discharge: Condition: Good, Destination: Home . AVS Declined  Performed by:  Laron Angelini, RN

## 2024-01-19 ENCOUNTER — Other Ambulatory Visit: Payer: Self-pay | Admitting: Gastroenterology

## 2024-01-19 DIAGNOSIS — K7460 Unspecified cirrhosis of liver: Secondary | ICD-10-CM

## 2024-01-23 ENCOUNTER — Ambulatory Visit
Admission: RE | Admit: 2024-01-23 | Discharge: 2024-01-23 | Disposition: A | Discharge status: Home / Self Care | Source: Ambulatory Visit | Attending: Gastroenterology | Admitting: Gastroenterology

## 2024-01-23 DIAGNOSIS — K7460 Unspecified cirrhosis of liver: Secondary | ICD-10-CM

## 2024-01-24 ENCOUNTER — Ambulatory Visit (INDEPENDENT_AMBULATORY_CARE_PROVIDER_SITE_OTHER)

## 2024-01-24 VITALS — BP 146/76 | HR 78 | Temp 97.6°F | Resp 18 | Ht 71.0 in | Wt 292.4 lb

## 2024-01-24 DIAGNOSIS — K922 Gastrointestinal hemorrhage, unspecified: Secondary | ICD-10-CM | POA: Diagnosis not present

## 2024-01-24 DIAGNOSIS — D500 Iron deficiency anemia secondary to blood loss (chronic): Secondary | ICD-10-CM | POA: Diagnosis not present

## 2024-01-24 MED ORDER — SODIUM CHLORIDE 0.9 % IV SOLN
510.0000 mg | Freq: Once | INTRAVENOUS | Status: AC
  Administered 2024-01-24: 510 mg via INTRAVENOUS
  Filled 2024-01-24: qty 17

## 2024-01-24 NOTE — Progress Notes (Signed)
 Diagnosis: Iron  Deficiency Anemia  Provider:  Praveen Mannam MD  Procedure: IV Infusion  IV Type: Peripheral, IV Location: R Forearm  Feraheme  (Ferumoxytol ), Dose: 510 mg  Infusion Start Time: 1028  Infusion Stop Time: 1045  Post Infusion IV Care: Patient declined observation and Peripheral IV Discontinued  Discharge: Condition: Good, Destination: Home . AVS Declined  Performed by:  Reford Olliff, RN

## 2024-02-28 ENCOUNTER — Other Ambulatory Visit: Payer: Self-pay

## 2024-02-28 DIAGNOSIS — D500 Iron deficiency anemia secondary to blood loss (chronic): Secondary | ICD-10-CM

## 2024-02-28 NOTE — Progress Notes (Signed)
 [facility] [phone]   [phone]  PROGRESS NOTE  Patient Care Team: Addie Camellia CROME, MD as PCP - General (Internal Medicine)  Hematological/Oncological History 12/21/2021-12/23/2021: Admitted due to acute iron  deficiency anemia with Hgb 6.7 and hemoccult test positive. He was given 3 units of PRBC and IV ferrlecit 250 mg x 2 doses. CT scan did show mildly nodular hepatic contour concerning for cirrhosis. Capsular endoscopy showed portal gastropathy and two small bowel bleeding sites. Hgb improved to 8.0 at discharge. 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  03/19/2022: Establish care with [facility] 03/30/2022-04/26/2022: Received IV venofer  200 mg x 5 doses 07/05/2022-08/02/2022: Received IV venofer  200 mg x 5 doses 09/28/2023-10/05/2023: Received IV feraheme  510 mg x 2 doses  CHIEF COMPLAINTS/PURPOSE OF CONSULTATION:  Iron  deficiency anemia 2/2 to GI Bleeding   HISTORY OF PRESENTING ILLNESS:  Austin Alexander 53 y.o. male returns for a follow up for iron  deficiency anemia. He was last seen 01/04/2024. In the interim, he received IV feraheme  510 mg x 2 doses in Oct 2025.   On exam today, Austin Alexander reports he has been well overall in the interim since our last visit.  He reports his last iron  infusions been well though he has not noticed much of a difference.  He reports his energy is about a 6 or 7 out of 10 today.  He is not having any lightheadedness, dizziness, or shortness of breath.  Reports he is doing his best to try to stay active.  He denies any issues with fevers, chills, sweats.  Denies any overt signs of bleeding, bruising, or dark stools.  He reports that he is currently being worked up for hidradenitis suppurativa by dermatologist.  He notes that he last saw his gastroenterologist about what month ago.  No current plans for endoscopies in the near future.  Overall he is at his baseline  level of health and has no additional questions concerns or complaints today.  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   Erectile dysfunction    Multifactorial. Improved with Cialis. New onset.   H/O folliculitis    Heartburn    Herpes exposure    History of herpetic exposure   Hyperlipidemia    Hypertension    variable in nature   Indigestion    Mild obesity    Multiple excoriations    multifactorial in origin   Situational stress    secondary to financial issues   Tobacco abuse     SURGICAL HISTORY: Past Surgical History:  Procedure Laterality Date   BALLOON ENTEROSCOPY  12/28/2021   Procedure: BALLOON ENTEROSCOPY;  Surgeon: San Sandor GAILS, DO;  Location: [facility];  Service: Gastroenterology;;  ENTEROSCOPY N/A 12/28/2021   Procedure: SINGLE BALLOON ENTEROSCOPY;  Surgeon: San Sandor GAILS, DO;  Location: [facility];  Service: Gastroenterology;  Laterality: N/A;   GIVENS CAPSULE STUDY N/A 12/22/2021   Procedure: GIVENS CAPSULE STUDY;  Surgeon: Rosalie Kitchens, MD;  Location: [facility];  Service: Gastroenterology;  Laterality: N/A;   HOT HEMOSTASIS N/A 12/28/2021   Procedure: HOT HEMOSTASIS (ARGON PLASMA COAGULATION/BICAP);  Surgeon: San Sandor GAILS, DO;  Location: [facility];  Service: Gastroenterology;  Laterality: N/A;   I & D EXTREMITY Left  06/28/2014   Procedure: IRRIGATION AND DEBRIDEMENT EXTREMITY;  Surgeon: Franky Curia, MD;  Location: [facility] OR;  Service: Orthopedics;  Laterality: Left;   INCISION AND DRAINAGE PERIRECTAL ABSCESS N/A 02/17/2014   Procedure: IRRIGATION AND DEBRIDEMENT PERIRECTAL  AND SCROTAL ABSCESS;  Surgeon: Vicenta Poli, MD;  Location: [facility] OR;  Service: General;  Laterality: N/A;   MINOR IRRIGATION AND DEBRIDEMENT OF WOUND Left 06/20/2015   Procedure: IRRIGATION AND DEBRIDEMENT Wound with nailbed repair;  Surgeon: Franky Curia, MD;  Location: [facility];  Service: Orthopedics;  Laterality: Left;   SUBMUCOSAL TATTOO INJECTION  12/28/2021   Procedure: SUBMUCOSAL TATTOO INJECTION;  Surgeon: San Sandor GAILS, DO;  Location: [facility];  Service: Gastroenterology;;    SOCIAL HISTORY: Social History   Socioeconomic History   Marital status: Single    Spouse name: Not on file   Number of children: Not on file   Years of education: Not on file   Highest education level: Not on file  Occupational History   Not on file  Tobacco Use   Smoking status: Every Day    Current packs/day: 1.00    Average packs/day: 1 pack/day for 20.0 years (20.0 ttl pk-yrs)    Types: Cigarettes   Smokeless tobacco: Never  Vaping Use   Vaping status: Never Used  Substance and Sexual Activity   Alcohol use: Not Currently    Alcohol/week: 12.0 standard drinks of alcohol    Types: 10 Cans of beer, 2 Shots of liquor per week    Comment: As of 04/06/14 - states occasional use   Drug use: No   Sexual activity: Yes  Other Topics Concern   Not on file  Social History Narrative   Not on file   Social Drivers of Health   Financial Resource Strain: Not on file  Food Insecurity: No Food Insecurity (12/22/2021)   Hunger Vital Sign    Worried About Running Out of Food in the Last Year: Never true    Ran Out of Food in the Last Year: Never true  Transportation Needs: No Transportation Needs (12/22/2021)   PRAPARE -  Administrator, Civil Service (Medical): No    Lack of Transportation (Non-Medical): No  Physical Activity: Not on file  Stress: Not on file  Social Connections: Not on file  Intimate Partner Violence: Not At Risk (12/22/2021)   Humiliation, Afraid, Rape, and Kick questionnaire    Fear of Current or Ex-Partner: No    Emotionally Abused: No    Physically Abused: No    Sexually Abused: No    FAMILY HISTORY: Family History  Problem Relation Age of Onset   Diabetes type II Mother    Hypertension Mother    Iron  deficiency Mother    Diabetes Other     ALLERGIES:  has no known allergies.  MEDICATIONS:  Current Outpatient Medications  Medication Sig Dispense Refill   B-D ULTRAFINE III SHORT PEN 31G X 8 MM MISC SMARTSIG:Pre-Filled  Pen Syringe SUB-Q Daily     blood glucose meter kit and supplies Dispense based on patient and insurance preference. Use up to four times daily as directed. (FOR ICD-9 250.00, 250.01). 1 each 0   carvedilol  (COREG ) 25 MG tablet Take 25 mg by mouth 2 (two) times daily with a meal.     Continuous Glucose Sensor (DEXCOM G7 SENSOR) MISC Change every 10 days 6 each 3   dapagliflozin propanediol (FARXIGA) 10 MG TABS tablet Take by mouth daily.     digoxin  (LANOXIN ) 0.125 MG tablet Take 125 mcg by mouth daily.     doxycycline  (VIBRA -TABS) 100 MG tablet Take 100 mg by mouth daily.     Dulaglutide  (TRULICITY ) 3 MG/0.5ML SOAJ Inject 3 mg into the skin once a week. Pharmacy Patient Advocate Encounter  Received notification from [employer] that Prior Authorization for Trulicity  has been APPROVED through 12/06/2023  PA #/Case ID/Reference #: [id number] 6 mL 3   ENTRESTO 49-51 MG Take 1 tablet by mouth 2 (two) times daily.     HUMALOG  KWIKPEN 100 UNIT/ML KwikPen INJECT 0- 10 UNITS with meals, as needed as instructed, maximum 30 units/day. 15 mL 4   insulin  degludec (TRESIBA  FLEXTOUCH) 200 UNIT/ML FlexTouch Pen Inject 60 Units into the skin daily. 30 mL 4   pantoprazole   (PROTONIX ) 40 MG tablet Take 1 tablet (40 mg total) by mouth daily. 30 tablet 1   simvastatin  (ZOCOR ) 40 MG tablet Take 1 tablet (40 mg total) by mouth daily. 30 tablet 0   No current facility-administered medications for this visit.    REVIEW OF SYSTEMS:   Constitutional: ( - ) fevers, ( - )  chills , ( - ) night sweats Eyes: ( - ) blurriness of vision, ( - ) double vision, ( - ) watery eyes Ears, nose, mouth, throat, and face: ( - ) mucositis, ( - ) sore throat Respiratory: ( - ) cough, ( - ) dyspnea, ( - ) wheezes Cardiovascular: ( - ) palpitation, ( - ) chest discomfort, ( - ) lower extremity swelling Gastrointestinal:  ( - ) nausea, ( - ) heartburn, ( - ) change in bowel habits Skin: ( - ) abnormal skin rashes Lymphatics: ( - ) new lymphadenopathy, ( - ) easy bruising Neurological: ( - ) numbness, ( - ) tingling, ( - ) new weaknesses Behavioral/Psych: ( - ) mood change, ( - ) new changes  All other systems were reviewed with the patient and are negative.  PHYSICAL EXAMINATION: ECOG PERFORMANCE STATUS: 0 - Asymptomatic  Vitals:   02/29/24 0932  BP: 136/81  Pulse: 88  Resp: 15  Temp: 98 F (36.7 C)  SpO2: 97%    Filed Weights   02/29/24 0932  Weight: 294 lb 1.6 oz (133.4 kg)     GENERAL: well appearing male in NAD  SKIN: skin color, texture, turgor are normal, no rashes or significant lesions EYES: conjunctiva are pink and non-injected, sclera clear LUNGS: clear to auscultation and percussion with normal breathing effort HEART: regular rate & rhythm and no murmurs and no lower extremity edema Musculoskeletal: no cyanosis of digits and no clubbing  PSYCH: alert & oriented x 3, fluent speech NEURO: no focal motor/sensory deficits  LABORATORY DATA:  I have reviewed the data as listed    Latest Ref Rng & Units 02/29/2024    9:03 AM 01/04/2024   10:24 AM 11/09/2023   10:17 AM  CBC  WBC 4.0 - 10.5 K/uL 8.5  8.0  9.0  Hemoglobin 13.0 - 17.0 g/dL 9.6  9.3  9.9    Hematocrit 39.0 - 52.0 % 29.8  29.7  32.2   Platelets 150 - 400 K/uL 386  375  394        Latest Ref Rng & Units 02/29/2024    9:03 AM 01/04/2024   10:24 AM 11/09/2023   10:17 AM  CMP  Glucose 70 - 99 mg/dL 869  84  868   BUN 6 - 20 mg/dL 30  26  26    Creatinine 0.61 - 1.24 mg/dL 7.38  7.71  7.89   Sodium 135 - 145 mmol/L 136  137  132   Potassium 3.5 - 5.1 mmol/L 4.7  4.4  4.7   Chloride 98 - 111 mmol/L 100  104  100   CO2 22 - 32 mmol/L 25  28  26    Calcium 8.9 - 10.3 mg/dL 8.7  8.3  8.6   Total Protein 6.5 - 8.1 g/dL 8.1  8.0  8.1   Total Bilirubin 0.0 - 1.2 mg/dL 0.3  0.4  0.3   Alkaline Phos 38 - 126 U/L 200  157  152   AST 15 - 41 U/L 17  11  11    ALT 0 - 44 U/L 16  10  11      ASSESSMENT & PLAN Philopateer Strine is a 53 y.o. male returns for a follow up for iron  deficiency anemia.   #Iron  deficiency anemia 2/2 GI bleed: --Underwent capsular endoscopy on 12/22/2021 that showed probable portal gastropathy and two small bowel bleeding sites. 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  --Patient is under the care of Dr. Elicia, last seen on 07/26/2022.  --Last received IV feraheme  510 mg x 2 doses from 09/28/23-10/05/23.  --Labs today shows WBC 8.5, Hgb 9.6, MCV 80.1, Plt 386, Cr 2.28.  --Recommend IV feraheme  x 2 doses if iron  is persistently low on today's labs.  -- Continue on ferrous sulfate 325 mg once daily.  --RTC in 8 weeks with labs and follow up  #CKD: --Under the care of Dr. Macel --Will consider Epo injection if there is persistent anemia <10 with no evidence of iron  deficiency.    #Possible cirrhosis: --Noted on abdominal US  from 10/08/2023 --Encouraged patient to follow up with is GI team for further evaluation.   #Hypertension:  --Patient is on carvedilol  25 mg BID --Encouraged smoking cessation --Patient is asymptomatic without any headaches, dizziness, etc --Advised to monitor BP at home daily  and follow up with PCP  No orders of the defined types were placed in this encounter.   All questions were answered. The patient knows to call the clinic with any problems, questions or concerns.  I have spent a total of 30 minutes minutes of face-to-face and non-face-to-face time, preparing to see the patient, performing a medically appropriate examination, counseling and educating the patient, ordering medications,documenting clinical information in the electronic health record,  and care coordination.   Norleen IVAR Kidney, MD Department of Hematology/Oncology [facility] Phone: [phone] Pager: [phone] Email: [email or web address]

## 2024-02-29 ENCOUNTER — Inpatient Hospital Stay: Admitting: Hematology and Oncology

## 2024-02-29 ENCOUNTER — Inpatient Hospital Stay: Attending: Hematology and Oncology

## 2024-02-29 VITALS — BP 136/81 | HR 88 | Temp 98.0°F | Resp 15 | Wt 294.1 lb

## 2024-02-29 DIAGNOSIS — D500 Iron deficiency anemia secondary to blood loss (chronic): Secondary | ICD-10-CM | POA: Insufficient documentation

## 2024-02-29 DIAGNOSIS — F17210 Nicotine dependence, cigarettes, uncomplicated: Secondary | ICD-10-CM | POA: Insufficient documentation

## 2024-02-29 DIAGNOSIS — N189 Chronic kidney disease, unspecified: Secondary | ICD-10-CM | POA: Insufficient documentation

## 2024-02-29 DIAGNOSIS — Z79899 Other long term (current) drug therapy: Secondary | ICD-10-CM | POA: Insufficient documentation

## 2024-02-29 DIAGNOSIS — I10 Essential (primary) hypertension: Secondary | ICD-10-CM | POA: Insufficient documentation

## 2024-02-29 LAB — IRON AND IRON BINDING CAPACITY (CC-WL,HP ONLY)
Iron: 39 ug/dL — ABNORMAL LOW (ref 45–182)
Saturation Ratios: 19 % (ref 17.9–39.5)
TIBC: 210 ug/dL — ABNORMAL LOW (ref 250–450)
UIBC: 171 ug/dL

## 2024-02-29 LAB — CBC WITH DIFFERENTIAL (CANCER CENTER ONLY)
Abs Immature Granulocytes: 0.03 K/uL (ref 0.00–0.07)
Basophils Absolute: 0 K/uL (ref 0.0–0.1)
Basophils Relative: 1 %
Eosinophils Absolute: 0.5 K/uL (ref 0.0–0.5)
Eosinophils Relative: 6 %
HCT: 29.8 % — ABNORMAL LOW (ref 39.0–52.0)
Hemoglobin: 9.6 g/dL — ABNORMAL LOW (ref 13.0–17.0)
Immature Granulocytes: 0 %
Lymphocytes Relative: 14 %
Lymphs Abs: 1.2 K/uL (ref 0.7–4.0)
MCH: 25.8 pg — ABNORMAL LOW (ref 26.0–34.0)
MCHC: 32.2 g/dL (ref 30.0–36.0)
MCV: 80.1 fL (ref 80.0–100.0)
Monocytes Absolute: 0.3 K/uL (ref 0.1–1.0)
Monocytes Relative: 4 %
Neutro Abs: 6.4 K/uL (ref 1.7–7.7)
Neutrophils Relative %: 75 %
Platelet Count: 386 K/uL (ref 150–400)
RBC: 3.72 MIL/uL — ABNORMAL LOW (ref 4.22–5.81)
RDW: 20 % — ABNORMAL HIGH (ref 11.5–15.5)
WBC Count: 8.5 K/uL (ref 4.0–10.5)
nRBC: 0 % (ref 0.0–0.2)

## 2024-02-29 LAB — CMP (CANCER CENTER ONLY)
ALT: 16 U/L (ref 0–44)
AST: 17 U/L (ref 15–41)
Albumin: 3.3 g/dL — ABNORMAL LOW (ref 3.5–5.0)
Alkaline Phosphatase: 200 U/L — ABNORMAL HIGH (ref 38–126)
Anion gap: 11 (ref 5–15)
BUN: 30 mg/dL — ABNORMAL HIGH (ref 6–20)
CO2: 25 mmol/L (ref 22–32)
Calcium: 8.7 mg/dL — ABNORMAL LOW (ref 8.9–10.3)
Chloride: 100 mmol/L (ref 98–111)
Creatinine: 2.61 mg/dL — ABNORMAL HIGH (ref 0.61–1.24)
GFR, Estimated: 28 mL/min — ABNORMAL LOW (ref >60)
Glucose, Bld: 130 mg/dL — ABNORMAL HIGH (ref 70–99)
Potassium: 4.7 mmol/L (ref 3.5–5.1)
Sodium: 136 mmol/L (ref 135–145)
Total Bilirubin: 0.3 mg/dL (ref 0.0–1.2)
Total Protein: 8.1 g/dL (ref 6.5–8.1)

## 2024-02-29 LAB — FERRITIN: Ferritin: 64 ng/mL (ref 24–336)

## 2024-02-29 LAB — RETIC PANEL
Immature Retic Fract: 32.6 % — ABNORMAL HIGH (ref 2.3–15.9)
RBC.: 3.76 MIL/uL — ABNORMAL LOW (ref 4.22–5.81)
Retic Count, Absolute: 72.6 K/uL (ref 19.0–186.0)
Retic Ct Pct: 1.9 % (ref 0.4–3.1)
Reticulocyte Hemoglobin: 28.9 pg (ref >27.9)

## 2024-03-05 ENCOUNTER — Ambulatory Visit: Payer: Self-pay

## 2024-03-05 NOTE — Telephone Encounter (Signed)
-----   Message from Nurse Almarie T sent at 03/05/2024  9:57 AM EST -----  ----- Message ----- From: Federico Norleen ONEIDA MADISON, MD Sent: 03/03/2024   6:47 PM EST To: Almarie DELENA Arabia, RN  Please let Mr. Hinnant know that his iron  labs look good.  There is no need for another iron  infusion at this time.  Will plan to see him back in January 2026 to reevaluate.  In the meantime if he  were to have any worsening symptoms or if he would need to be checked early we are happy to see him back sooner. ----- Message ----- From: Rebecka, [facility] [place] Sent: 02/29/2024   9:24 AM EST To: Norleen ONEIDA Federico MADISON, MD

## 2024-03-05 NOTE — Telephone Encounter (Signed)
   TC to Mr. Garden and informed that his iron  labs look good.  Advised there is no need for another iron  infusion at this time.  Advised we plan to see him back in January 2026 to reevaluate.  Advised in the meantime if he were to have any worsening symptoms or if he would need to be checked early we are happy to see him back sooner. Reviewed scheduled appt and time Jan 2026. Mr Shin verbalized understanding.  Ronal Pray, RN

## 2024-04-11 ENCOUNTER — Encounter: Payer: Self-pay | Admitting: Endocrinology

## 2024-04-11 ENCOUNTER — Ambulatory Visit: Admitting: Endocrinology

## 2024-04-11 ENCOUNTER — Ambulatory Visit: Payer: Self-pay | Admitting: Endocrinology

## 2024-04-11 VITALS — BP 140/80 | HR 74 | Resp 16 | Ht 71.0 in | Wt 298.2 lb

## 2024-04-11 DIAGNOSIS — E1165 Type 2 diabetes mellitus with hyperglycemia: Secondary | ICD-10-CM | POA: Diagnosis not present

## 2024-04-11 DIAGNOSIS — Z794 Long term (current) use of insulin: Secondary | ICD-10-CM | POA: Diagnosis not present

## 2024-04-11 LAB — POCT GLYCOSYLATED HEMOGLOBIN (HGB A1C): Hemoglobin A1C: 6.8 % — AB (ref 4.0–5.6)

## 2024-04-11 NOTE — Progress Notes (Signed)
 "  Outpatient Endocrinology Note [name], MD   Patient's Name: Austin Alexander    DOB: 1971/04/03    MRN: [id number]                                                    REASON OF VISIT: Follow up for type 2 diabetes mellitus  PCP: Addie Camellia CROME, MD  HISTORY OF PRESENT ILLNESS:   Austin Alexander is a 53 y.o. old male with past medical history listed below, is here for follow up for type 2 diabetes mellitus.   Pertinent Diabetes History: Patient was previously seen by Dr. Von and was last time seen in August 2024.  Patient was diagnosed with type 2 diabetes mellitus in 2009.  Insulin  therapy was started around 2015.  Chronic Diabetes Complications : Retinopathy: no. Last ophthalmology exam was done on annually, reportedly, following with ophthalmology regularly.  Nephropathy: CKD, on ACE/ARB , nephrology every 4 months.  On Farxiga.  On  Entresto. Peripheral neuropathy: mildly present Coronary artery disease: no Stroke: no  Relevant comorbidities and cardiovascular risk factors: Obesity: yes Body mass index is 41.59 kg/m.  Hypertension: Yes  Hyperlipidemia : Yes, on statin   Current / Home Diabetic regimen includes:  INSULIN  regimen is: Tresiba  60-70 units daily, Humalog  0-12 units in am, 0-12 units at lunch and 0-12 units dinner, has not been taking Humalog  mostly.   Non-insulin  hypoglycemic drugs the patient is taking are: Farxiga 10 mg daily, Trulicity  3 mg weekly.  Patient preferred to stay on Trulicity  3 mg weekly.  Prior diabetic medications: Glimepiride /Amaryl  and Actos/pioglitazone in the past.  Higher dose of Humalog  in the past.  Jardiance in the past changed to Farxiga due to cost.  Glycemic data:    CONTINUOUS GLUCOSE MONITORING SYSTEM (CGMS) INTERPRETATION:  Dexcom G7 CGM-  Sensor Download (Sensor download was reviewed and summarized below.) Dates: December 18 to April 11, 2024, 14 days  Glucose Management Indicator: 6.4% Sensor usage:94  %    Impression: - Mostly acceptable blood sugar.  No concerning hypoglycemia occasionally blood sugar up to 180 range postprandially.  Occasionally blood sugar in the low normal range in between the meals, no concerning hypoglycemia.  Hypoglycemia: Patient has no hypoglycemic episodes. Patient has hypoglycemia awareness.  Factors modifying glucose control: 1.  Diabetic diet assessment: 2-3 meals a day.   2.  Staying active or exercising: Difficulty doing much exercise.  3.  Medication compliance: compliant al of the time.  Interval history  CGM data as reviewed above.  Mostly acceptable blood sugar.  Hemoglobin A1c 6.8% today.  Diabetes regimen as reviewed and noted above.  He has been mostly taking Tresiba  60 units daily and sometimes up to 70 units.  He has not been using Humalog  much.  He has been tolerating Trulicity  well.  He has complaints of occasional numbness and ting of the feet.  No vision problem.  No other complaints today.  REVIEW OF SYSTEMS As per history of present illness.   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   Erectile dysfunction    Multifactorial. Improved with Cialis. New onset.   H/O folliculitis    Heartburn    Herpes exposure    History of herpetic exposure   Hyperlipidemia    Hypertension    variable in nature   Indigestion    Mild obesity    Multiple excoriations     multifactorial in origin   Situational stress    secondary to financial issues   Tobacco abuse     PAST SURGICAL HISTORY: Past Surgical History:  Procedure Laterality Date   BALLOON ENTEROSCOPY  12/28/2021   Procedure: BALLOON ENTEROSCOPY;  Surgeon: San Sandor GAILS, DO;  Location: [facility];  Service: Gastroenterology;;   ENTEROSCOPY N/A 12/28/2021   Procedure: SINGLE BALLOON ENTEROSCOPY;  Surgeon: San Sandor GAILS, DO;  Location: [facility];  Service: Gastroenterology;  Laterality: N/A;   GIVENS CAPSULE STUDY N/A 12/22/2021   Procedure: GIVENS CAPSULE STUDY;  Surgeon: Rosalie Kitchens, MD;  Location: [facility];  Service: Gastroenterology;  Laterality: N/A;   HOT HEMOSTASIS N/A 12/28/2021   Procedure: HOT HEMOSTASIS (ARGON PLASMA COAGULATION/BICAP);  Surgeon: San Sandor GAILS, DO;  Location: [facility];  Service: Gastroenterology;  Laterality: N/A;   I & D EXTREMITY Left 06/28/2014   Procedure: IRRIGATION AND DEBRIDEMENT EXTREMITY;  Surgeon: Franky Curia, MD;  Location: [facility] OR;  Service: Orthopedics;  Laterality: Left;   INCISION AND DRAINAGE PERIRECTAL ABSCESS N/A 02/17/2014   Procedure: IRRIGATION AND DEBRIDEMENT PERIRECTAL  AND SCROTAL ABSCESS;  Surgeon: Vicenta Poli, MD;  Location: [facility] OR;  Service: General;  Laterality: N/A;   MINOR IRRIGATION AND DEBRIDEMENT OF WOUND Left 06/20/2015   Procedure: IRRIGATION AND DEBRIDEMENT Wound with nailbed repair;  Surgeon: Franky Curia, MD;  Location: [facility];  Service: Orthopedics;  Laterality: Left;   SUBMUCOSAL TATTOO INJECTION  12/28/2021   Procedure: SUBMUCOSAL TATTOO INJECTION;  Surgeon: San Sandor GAILS, DO;  Location: [facility];  Service: Gastroenterology;;    ALLERGIES: No Known Allergies  FAMILY HISTORY:  Family History  Problem Relation Age of Onset   Diabetes type II Mother    Hypertension Mother    Iron  deficiency Mother    Diabetes Other     SOCIAL HISTORY: Social History   Socioeconomic  History   Marital status: Single    Spouse name: Not on file   Number of children: Not on file   Years of education: Not on file   Highest education level: Not on file  Occupational History   Not on file  Tobacco Use   Smoking status: Every Day    Current packs/day: 1.00    Average packs/day: 1 pack/day for 20.0 years (20.0 ttl pk-yrs)    Types: Cigarettes   Smokeless tobacco: Never  Vaping Use   Vaping status: Never Used  Substance and Sexual Activity   Alcohol use: Not Currently    Alcohol/week: 12.0 standard drinks of alcohol    Types: 10 Cans of beer, 2 Shots of liquor per week    Comment: As of 04/06/14 - states occasional use   Drug use: No   Sexual activity:  Yes  Other Topics Concern   Not on file  Social History Narrative   Not on file   Social Drivers of Health   Tobacco Use: High Risk (04/11/2024)   Patient History    Smoking Tobacco Use: Every Day    Smokeless Tobacco Use: Never    Passive Exposure: Not on file  Financial Resource Strain: Not on file  Food Insecurity: No Food Insecurity (12/22/2021)   Hunger Vital Sign    Worried About Running Out of Food in the Last Year: Never true    Ran Out of Food in the Last Year: Never true  Transportation Needs: No Transportation Needs (12/22/2021)   PRAPARE - Administrator, Civil Service (Medical): No    Lack of Transportation (Non-Medical): No  Physical Activity: Not on file  Stress: Not on file  Social Connections: Not on file  Depression (EYV7-0): Not on file  Alcohol Screen: Not on file  Housing: Low Risk (12/22/2021)   Housing    Last Housing Risk Score: 0  Utilities: Not At Risk (12/22/2021)   AHC Utilities    Threatened with loss of utilities: No  Health Literacy: Not on file    MEDICATIONS:  Current Outpatient Medications  Medication Sig Dispense Refill   B-D ULTRAFINE III SHORT PEN 31G X 8 MM MISC SMARTSIG:Pre-Filled Pen Syringe SUB-Q Daily     blood glucose meter kit and supplies  Dispense based on patient and insurance preference. Use up to four times daily as directed. (FOR ICD-9 250.00, 250.01). 1 each 0   carvedilol  (COREG ) 25 MG tablet Take 25 mg by mouth 2 (two) times daily with a meal.     Continuous Glucose Sensor (DEXCOM G7 SENSOR) MISC Change every 10 days 6 each 3   dapagliflozin propanediol (FARXIGA) 10 MG TABS tablet Take by mouth daily.     digoxin  (LANOXIN ) 0.125 MG tablet Take 125 mcg by mouth daily.     doxycycline  (VIBRA -TABS) 100 MG tablet Take 100 mg by mouth daily.     Dulaglutide  (TRULICITY ) 3 MG/0.5ML SOAJ Inject 3 mg into the skin once a week. Pharmacy Patient Advocate Encounter  Received notification from [employer] that Prior Authorization for Trulicity  has been APPROVED through 12/06/2023  PA #/Case ID/Reference #: [id number] 6 mL 3   ENTRESTO 49-51 MG Take 1 tablet by mouth 2 (two) times daily.     HUMALOG  KWIKPEN 100 UNIT/ML KwikPen INJECT 0- 10 UNITS with meals, as needed as instructed, maximum 30 units/day. 15 mL 4   insulin  degludec (TRESIBA  FLEXTOUCH) 200 UNIT/ML FlexTouch Pen Inject 60 Units into the skin daily. 30 mL 4   pantoprazole  (PROTONIX ) 40 MG tablet Take 1 tablet (40 mg total) by mouth daily. 30 tablet 1   simvastatin  (ZOCOR ) 40 MG tablet Take 1 tablet (40 mg total) by mouth daily. 30 tablet 0   No current facility-administered medications for this visit.    PHYSICAL EXAM: Vitals:   04/11/24 1008 04/11/24 1009  BP: (!) 142/82 (!) 140/80  Pulse: 74   Resp: 16   SpO2: 96%   Weight: 298 lb 3.2 oz (135.3 kg)   Height: 5' 11 (1.803 m)      Body mass index is 41.59 kg/m.  Wt Readings from Last 3 Encounters:  04/11/24 298 lb 3.2 oz (135.3 kg)  02/29/24 294 lb 1.6 oz (133.4 kg)  01/24/24 292 lb 6.4 oz (132.6 kg)    General: Well developed, well nourished male in no apparent distress.  HEENT: AT/[place], no external lesions.  Eyes: Conjunctiva clear and no icterus. Neck: Neck supple  Lungs: Respirations not labored Neurologic:  Alert, oriented, normal speech Extremities / Skin: Dry.  Psychiatric: Does not appear depressed or anxious   Diabetic Foot Exam - Simple   No data filed     LABS Reviewed Lab Results  Component Value Date   HGBA1C 6.8 (A) 04/11/2024   HGBA1C 6.8 (A) 12/08/2023   HGBA1C 6.2 (A) 08/08/2023   Lab Results  Component Value Date   FRUCTOSAMINE 249 01/25/2022   FRUCTOSAMINE 217 02/19/2020   FRUCTOSAMINE 346 (H) 07/20/2019   Lab Results  Component Value Date   CHOL 124 09/02/2022   HDL 31.40 (L) 09/02/2022   LDLCALC 60 09/02/2022   LDLDIRECT 76.0 01/25/2022   TRIG 163.0 (H) 09/02/2022   CHOLHDL 4 09/02/2022   No results found for: [facility]  Lab Results  Component Value Date   CREATININE 2.61 (H) 02/29/2024   Lab Results  Component Value Date   GFR 42.56 (L) 09/02/2022    ASSESSMENT / PLAN  1. Type 2 diabetes mellitus with hyperglycemia, with long-term current use of insulin  (HCC)     Diabetes Mellitus type 2, complicated by CKD - Diabetic status / severity: Fair control.  Lab Results  Component Value Date   HGBA1C 6.8 (A) 04/11/2024   GMI on CGM 6.4%.  - Hemoglobin A1c goal : <6.5%  Reasonable control of diabetes mellitus.  Adjusted diabetes regimen as follows.  - Medications: See below.  I) advised to stay on Tresiba  60 units daily.  Advised not to take 7 units, to avoid potential hypoglycemia. II) continue Trulicity  3 mg weekly.  Discussed about increasing dose however patient wants to stay on current dose. III) continue Humalog  as needed, discussed instruction when to use Humalog .  Advised to take Humalog  5 to 10 units for heavier meals. IV) Farxiga 10 mg daily.  - Home glucose testing: CGM/Dexcom G7 and check as needed.  - Discussed/ Gave Hypoglycemia treatment plan.  # Consult : not required at this time.   # Annual urine for microalbuminuria/ creatinine ratio, + microalbuminuria currently, continue ACE/ARB ?  On Entresto and Farxiga.   Following with nephrology regularly.  Urine microalbumin creatinine ratio elevated 1746 in September 2024 reviewed nephrology note.  He wants to monitor with nephrology.   Last  No results found for: MICRALBCREAT   # Foot check nightly.  # Annual dilated diabetic eye exams.   - Diet: Make healthy diabetic food choices - Life style / activity / exercise: Discussed.  2. Blood pressure  -  BP Readings from Last 1 Encounters:  04/11/24 (!) 140/80    - Control is in target.  - No change in current plans.  3. Lipid status / Hyperlipidemia - Last  Lab Results  Component Value Date   LDLCALC 60 09/02/2022   - Continue simvastatin  40 mg daily.  Fenofibrate  48 mg daily.  Managed by primary care provider.  Diagnoses and all orders for this visit:  Type 2 diabetes mellitus with hyperglycemia, with long-term current use of insulin  (HCC) -     POCT glycosylated hemoglobin (Hb A1C)    DISPOSITION Follow up in clinic in 4 months suggested.   All questions answered and patient verbalized understanding of the plan.  TRUE Shackleford, MD [facility] Endocrinology [facility] [address], [address] [place], [place] [address] Phone # [phone]  At least part of this note was generated using voice  recognition software. Inadvertent word errors may have occurred, which were not recognized during the proofreading process. "

## 2024-04-24 NOTE — Progress Notes (Unsigned)
 " Austin Alexander [phone]) [phone]   [phone]  PROGRESS NOTE  Patient Care Team: Austin Camellia CROME, MD as PCP - General (Internal Medicine)  Hematological/Oncological History 12/21/2021-12/23/2021: Admitted due to acute iron  deficiency anemia with Hgb 6.7 and hemoccult test positive. He was given 3 units of PRBC and IV ferrlecit 250 mg x 2 doses. CT scan did show mildly nodular hepatic contour concerning for cirrhosis. Capsular endoscopy showed portal gastropathy and two small bowel bleeding sites. Hgb improved to 8.0 at discharge. 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  03/19/2022: Establish care with Austin Alexander 03/30/2022-04/26/2022: Received IV venofer  200 mg x 5 doses 07/05/2022-08/02/2022: Received IV venofer  200 mg x 5 doses 09/28/2023-10/05/2023: Received IV feraheme  510 mg x 2 doses 01/17/2024-01/24/2024: Received IV feraheme  510 mg x 2 doses  CHIEF COMPLAINTS/PURPOSE OF CONSULTATION:  Iron  deficiency anemia 2/2 to GI Bleeding   HISTORY OF PRESENTING ILLNESS:  Austin Alexander 54 y.o. male returns for a follow up for iron  deficiency anemia. He was last seen 02/29/2024. In the interim, he received IV feraheme  510 mg x 2 doses in Oct 2025.   On exam today, Austin Alexander reports his energy and appetite are overall stable. He was recently evaluated by his nephrologist and was started on hydralazine  and isosorbide dinitrate earlier this month. He denies any signs of bleeding including hematochezia or melena. He denies nausea, vomiting or bowel habit changes. He denies easy bruising or overt signs of bleeding. He denies fevers, chills, sweats, shortness of breath, chest pain or cough. Rest of the ROS is below.    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   Erectile dysfunction    Multifactorial. Improved with Cialis. New onset.   H/O folliculitis    Heartburn    Herpes exposure    History of herpetic exposure   Hyperlipidemia    Hypertension    variable in nature   Indigestion    Mild obesity    Multiple excoriations    multifactorial in origin   Situational stress    secondary to financial issues   Tobacco abuse     SURGICAL HISTORY: Past Surgical History:  Procedure Laterality Date   BALLOON ENTEROSCOPY  12/28/2021   Procedure: BALLOON ENTEROSCOPY;  Surgeon: San Sandor GAILS, DO;  Location: [facility];  Service: Gastroenterology;;   ENTEROSCOPY N/A 12/28/2021   Procedure: SINGLE BALLOON ENTEROSCOPY;  Surgeon: San Sandor GAILS, DO;  Location: [facility];  Service: Gastroenterology;  Laterality: N/A;   GIVENS CAPSULE STUDY N/A 12/22/2021   Procedure: GIVENS CAPSULE STUDY;  Surgeon: Rosalie Kitchens, MD;  Location: [facility];  Service: Gastroenterology;  Laterality: N/A;   HOT HEMOSTASIS N/A 12/28/2021   Procedure: HOT HEMOSTASIS (ARGON PLASMA COAGULATION/BICAP);  Surgeon: Cirigliano, Vito V,  DO;  Location: [facility];  Service: Gastroenterology;  Laterality: N/A;   I & D EXTREMITY Left 06/28/2014   Procedure: IRRIGATION AND DEBRIDEMENT EXTREMITY;  Surgeon: Franky Curia, MD;  Location: [facility] OR;  Service: Orthopedics;  Laterality: Left;   INCISION AND DRAINAGE PERIRECTAL ABSCESS N/A 02/17/2014   Procedure: IRRIGATION AND DEBRIDEMENT PERIRECTAL  AND SCROTAL ABSCESS;  Surgeon: Vicenta Poli, MD;  Location: [facility] OR;   Service: General;  Laterality: N/A;   MINOR IRRIGATION AND DEBRIDEMENT OF WOUND Left 06/20/2015   Procedure: IRRIGATION AND DEBRIDEMENT Wound with nailbed repair;  Surgeon: Franky Curia, MD;  Location: [facility];  Service: Orthopedics;  Laterality: Left;   SUBMUCOSAL TATTOO INJECTION  12/28/2021   Procedure: SUBMUCOSAL TATTOO INJECTION;  Surgeon: San Sandor GAILS, DO;  Location: [facility];  Service: Gastroenterology;;    SOCIAL HISTORY: Social History   Socioeconomic History   Marital status: Single    Spouse name: Not on file   Number of children: Not on file   Years of education: Not on file   Highest education level: Not on file  Occupational History   Not on file  Tobacco Use   Smoking status: Every Day    Current packs/day: 1.00    Average packs/day: 1 pack/day for 20.0 years (20.0 ttl pk-yrs)    Types: Cigarettes   Smokeless tobacco: Never  Vaping Use   Vaping status: Never Used  Substance and Sexual Activity   Alcohol use: Not Currently    Alcohol/week: 12.0 standard drinks of alcohol    Types: 10 Cans of beer, 2 Shots of liquor per week    Comment: As of 04/06/14 - states occasional use   Drug use: No   Sexual activity: Yes  Other Topics Concern   Not on file  Social History Narrative   Not on file   Social Drivers of Health   Tobacco Use: High Risk (04/11/2024)   Patient History    Smoking Tobacco Use: Every Day    Smokeless Tobacco Use: Never    Passive Exposure: Not on file  Financial Resource Strain: Not on file  Food Insecurity: No Food Insecurity (12/22/2021)   Hunger Vital Sign    Worried About Running Out of Food in the Last Year: Never true    Ran Out of Food in the Last Year: Never true  Transportation Needs: No Transportation Needs (12/22/2021)   PRAPARE - Administrator, Civil Service (Medical): No    Lack of Transportation (Non-Medical): No  Physical Activity: Not on file  Stress: Not on file  Social Connections:  Not on file  Intimate Partner Violence: Not At Risk (12/22/2021)   Humiliation, Afraid, Rape, and Kick questionnaire    Fear of Current or Ex-Partner: No    Emotionally Abused: No    Physically Abused: No    Sexually Abused: No  Depression (PHQ2-9): Not on file  Alcohol Screen: Not on file  Housing: Low Risk (12/22/2021)   Housing    Last Housing Risk Score: 0  Utilities: Not At Risk (12/22/2021)   AHC Utilities    Threatened with loss of utilities: No  Health Literacy: Not on file    FAMILY HISTORY: Family History  Problem Relation Age of Onset   Diabetes type II Mother    Hypertension Mother    Iron  deficiency Mother    Diabetes Other     ALLERGIES:  has no known allergies.  MEDICATIONS:  Current Outpatient Medications  Medication Sig Dispense  Refill   B-D ULTRAFINE III SHORT PEN 31G X 8 MM MISC SMARTSIG:Pre-Filled Pen Syringe SUB-Q Daily     blood glucose meter kit and supplies Dispense based on patient and insurance preference. Use up to four times daily as directed. (FOR ICD-9 250.00, 250.01). 1 each 0   carvedilol  (COREG ) 25 MG tablet Take 25 mg by mouth 2 (two) times daily with a meal.     Continuous Glucose Sensor (DEXCOM G7 SENSOR) MISC Change every 10 days 6 each 3   dapagliflozin propanediol (FARXIGA) 10 MG TABS tablet Take by mouth daily.     digoxin  (LANOXIN ) 0.125 MG tablet Take 125 mcg by mouth daily.     doxycycline  (VIBRA -TABS) 100 MG tablet Take 100 mg by mouth daily.     Dulaglutide  (TRULICITY ) 3 MG/0.5ML SOAJ Inject 3 mg into the skin once a week. Pharmacy Patient Advocate Encounter  Received notification from [employer] that Prior Authorization for Trulicity  has been APPROVED through 12/06/2023  PA #/Case ID/Reference #: [id number] 6 mL 3   HUMALOG  KWIKPEN 100 UNIT/ML KwikPen INJECT 0- 10 UNITS with meals, as needed as instructed, maximum 30 units/day. 15 mL 4   hydrALAZINE  (APRESOLINE ) 50 MG tablet Take 50 mg by mouth 2 (two) times daily.     insulin  degludec  (TRESIBA  FLEXTOUCH) 200 UNIT/ML FlexTouch Pen Inject 60 Units into the skin daily. 30 mL 4   isosorbide dinitrate (ISORDIL) 20 MG tablet Take 20 mg by mouth 2 (two) times daily.     pantoprazole  (PROTONIX ) 40 MG tablet Take 1 tablet (40 mg total) by mouth daily. 30 tablet 1   simvastatin  (ZOCOR ) 40 MG tablet Take 1 tablet (40 mg total) by mouth daily. 30 tablet 0   ENTRESTO 49-51 MG Take 1 tablet by mouth 2 (two) times daily. (Patient not taking: Reported on 04/25/2024)     No current facility-administered medications for this visit.    REVIEW OF SYSTEMS:   Constitutional: ( - ) fevers, ( - )  chills , ( - ) night sweats Eyes: ( - ) blurriness of vision, ( - ) double vision, ( - ) watery eyes Ears, nose, mouth, throat, and face: ( - ) mucositis, ( - ) sore throat Respiratory: ( - ) cough, ( - ) dyspnea, ( - ) wheezes Cardiovascular: ( - ) palpitation, ( - ) chest discomfort, ( - ) lower extremity swelling Gastrointestinal:  ( - ) nausea, ( - ) heartburn, ( - ) change in bowel habits Skin: ( - ) abnormal skin rashes Lymphatics: ( - ) new lymphadenopathy, ( - ) easy bruising Neurological: ( - ) numbness, ( - ) tingling, ( - ) new weaknesses Behavioral/Psych: ( - ) mood change, ( - ) new changes  All other systems were reviewed with the patient and are negative.  PHYSICAL EXAMINATION: ECOG PERFORMANCE STATUS: 0 - Asymptomatic  Vitals:   04/25/24 0951  BP: 127/66  Pulse: 91  Resp: 18  Temp: 98.2 F (36.8 C)  SpO2: 96%    Filed Weights   04/25/24 0951  Weight: 298 lb 4.8 oz (135.3 kg)    GENERAL: well appearing male in NAD  SKIN: skin color, texture, turgor are normal, no rashes or significant lesions EYES: conjunctiva are pink and non-injected, sclera clear LUNGS: clear to auscultation and percussion with normal breathing effort HEART: regular rate & rhythm and no murmurs and no lower extremity edema Musculoskeletal: no cyanosis of digits and no clubbing  PSYCH: alert &  oriented  x 3, fluent speech NEURO: no focal motor/sensory deficits  LABORATORY DATA:  I have reviewed the data as listed    Latest Ref Rng & Units 04/25/2024    9:24 AM 02/29/2024    9:03 AM 01/04/2024   10:24 AM  CBC  WBC 4.0 - 10.5 K/uL 8.1  8.5  8.0   Hemoglobin 13.0 - 17.0 g/dL 9.1  9.6  9.3   Hematocrit 39.0 - 52.0 % 28.2  29.8  29.7   Platelets 150 - 400 K/uL 324  386  375        Latest Ref Rng & Units 04/25/2024    9:24 AM 02/29/2024    9:03 AM 01/04/2024   10:24 AM  CMP  Glucose 70 - 99 mg/dL 891  869  84   BUN 6 - 20 mg/dL 29  30  26    Creatinine 0.61 - 1.24 mg/dL 6.67  7.38  7.71   Sodium 135 - 145 mmol/L 134  136  137   Potassium 3.5 - 5.1 mmol/L 4.9  4.7  4.4   Chloride 98 - 111 mmol/L 100  100  104   CO2 22 - 32 mmol/L 22  25  28    Calcium 8.9 - 10.3 mg/dL 8.4  8.7  8.3   Total Protein 6.5 - 8.1 g/dL 8.2  8.1  8.0   Total Bilirubin 0.0 - 1.2 mg/dL 0.4  0.3  0.4   Alkaline Phos 38 - 126 U/L 141  200  157   AST 15 - 41 U/L 17  17  11    ALT 0 - 44 U/L 15  16  10      ASSESSMENT & PLAN Austin Alexander is a 54 y.o. male returns for a follow up for iron  deficiency anemia.   #Iron  deficiency anemia 2/2 GI bleed: --Underwent capsular endoscopy on 12/22/2021 that showed probable portal gastropathy and two small bowel bleeding sites. 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  --Patient is under the care of Dr. Elicia, last seen on 07/26/2022.  --Last received IV feraheme  510 mg x 2 doses from 01/17/2024-01/24/2024 --Labs today shows anemia with Hgb 9.1, MCV 81.0. Iron  panel shows iron  20, saturation 8%, ferritin pending.  --Proceed with IV feraheme  510 mg x 2 doses. -- Continue on ferrous sulfate 325 mg once daily.  --RTC in 3 months with labs only and 6 months with labs/follow up.   #CKD: --Under the care of Dr. Macel, creatinine has increased to 3.32.  --Will forward lab results to Dr. Macel for review.   --Will consider Epo injection if there is persistent anemia <10 with no evidence of iron  deficiency.   #Possible cirrhosis: --Noted on abdominal US  from 10/08/2023 --Encouraged patient to follow up with is GI team for further evaluation.     No orders of the defined types were placed in this encounter.   All questions were answered. The patient knows to call the clinic with any problems, questions or concerns.  I have spent a total of 30 minutes minutes of face-to-face and non-face-to-face time, preparing to see the patient, performing a medically appropriate examination, counseling and educating the patient, ordering medications,documenting clinical information in the electronic health record,  and care coordination.   Johnston Police PA-C Dept of [facility] and On[facility] Phone: [phone]   "

## 2024-04-25 ENCOUNTER — Other Ambulatory Visit: Payer: Self-pay

## 2024-04-25 ENCOUNTER — Inpatient Hospital Stay: Attending: Hematology and Oncology

## 2024-04-25 ENCOUNTER — Inpatient Hospital Stay (HOSPITAL_BASED_OUTPATIENT_CLINIC_OR_DEPARTMENT_OTHER): Admitting: Physician Assistant

## 2024-04-25 VITALS — BP 127/66 | HR 91 | Temp 98.2°F | Resp 18 | Ht 71.0 in | Wt 298.3 lb

## 2024-04-25 DIAGNOSIS — F17210 Nicotine dependence, cigarettes, uncomplicated: Secondary | ICD-10-CM | POA: Insufficient documentation

## 2024-04-25 DIAGNOSIS — D500 Iron deficiency anemia secondary to blood loss (chronic): Secondary | ICD-10-CM

## 2024-04-25 DIAGNOSIS — N189 Chronic kidney disease, unspecified: Secondary | ICD-10-CM | POA: Insufficient documentation

## 2024-04-25 LAB — CMP (CANCER CENTER ONLY)
ALT: 15 U/L (ref 0–44)
AST: 17 U/L (ref 15–41)
Albumin: 3.5 g/dL (ref 3.5–5.0)
Alkaline Phosphatase: 141 U/L — ABNORMAL HIGH (ref 38–126)
Anion gap: 13 (ref 5–15)
BUN: 29 mg/dL — ABNORMAL HIGH (ref 6–20)
CO2: 22 mmol/L (ref 22–32)
Calcium: 8.4 mg/dL — ABNORMAL LOW (ref 8.9–10.3)
Chloride: 100 mmol/L (ref 98–111)
Creatinine: 3.32 mg/dL — ABNORMAL HIGH (ref 0.61–1.24)
GFR, Estimated: 21 mL/min — ABNORMAL LOW
Glucose, Bld: 108 mg/dL — ABNORMAL HIGH (ref 70–99)
Potassium: 4.9 mmol/L (ref 3.5–5.1)
Sodium: 134 mmol/L — ABNORMAL LOW (ref 135–145)
Total Bilirubin: 0.4 mg/dL (ref 0.0–1.2)
Total Protein: 8.2 g/dL — ABNORMAL HIGH (ref 6.5–8.1)

## 2024-04-25 LAB — CBC WITH DIFFERENTIAL (CANCER CENTER ONLY)
Abs Immature Granulocytes: 0.04 K/uL (ref 0.00–0.07)
Basophils Absolute: 0 K/uL (ref 0.0–0.1)
Basophils Relative: 1 %
Eosinophils Absolute: 0.5 K/uL (ref 0.0–0.5)
Eosinophils Relative: 6 %
HCT: 28.2 % — ABNORMAL LOW (ref 39.0–52.0)
Hemoglobin: 9.1 g/dL — ABNORMAL LOW (ref 13.0–17.0)
Immature Granulocytes: 1 %
Lymphocytes Relative: 15 %
Lymphs Abs: 1.2 K/uL (ref 0.7–4.0)
MCH: 26.1 pg (ref 26.0–34.0)
MCHC: 32.3 g/dL (ref 30.0–36.0)
MCV: 81 fL (ref 80.0–100.0)
Monocytes Absolute: 0.5 K/uL (ref 0.1–1.0)
Monocytes Relative: 6 %
Neutro Abs: 5.8 K/uL (ref 1.7–7.7)
Neutrophils Relative %: 71 %
Platelet Count: 324 K/uL (ref 150–400)
RBC: 3.48 MIL/uL — ABNORMAL LOW (ref 4.22–5.81)
RDW: 18.6 % — ABNORMAL HIGH (ref 11.5–15.5)
WBC Count: 8.1 K/uL (ref 4.0–10.5)
nRBC: 0 % (ref 0.0–0.2)

## 2024-04-25 LAB — IRON AND IRON BINDING CAPACITY (CC-WL,HP ONLY)
Iron: 20 ug/dL — ABNORMAL LOW (ref 45–182)
Saturation Ratios: 8 % — ABNORMAL LOW (ref 17.9–39.5)
TIBC: 238 ug/dL — ABNORMAL LOW (ref 250–450)
UIBC: 218 ug/dL

## 2024-04-25 LAB — FERRITIN: Ferritin: 34 ng/mL (ref 24–336)

## 2024-04-25 LAB — RETIC PANEL
Immature Retic Fract: 24.2 % — ABNORMAL HIGH (ref 2.3–15.9)
RBC.: 3.51 MIL/uL — ABNORMAL LOW (ref 4.22–5.81)
Retic Count, Absolute: 63.9 K/uL (ref 19.0–186.0)
Retic Ct Pct: 1.8 % (ref 0.4–3.1)
Reticulocyte Hemoglobin: 26.4 pg — ABNORMAL LOW

## 2024-04-26 ENCOUNTER — Telehealth: Payer: Self-pay | Admitting: Pharmacy Technician

## 2024-04-26 ENCOUNTER — Telehealth: Payer: Self-pay | Admitting: Physician Assistant

## 2024-04-26 NOTE — Telephone Encounter (Signed)
" ° °  Auth Submission: APPROVED Site of care: Site of care: [facility] Payer: [employer] Commercial Medication & CPT/J Code(s) submitted: Feraheme  (ferumoxytol ) [id number] Diagnosis Code: D50.0 Route of submission (phone, fax, portal): Latent portal  Phone # [phone] Fax # Auth type: Buy/Bill PB Units/visits requested: 510 mg x 2 Reference number: [id number] Approval from: 05/08/2024 to 07/10/2024        "

## 2024-04-26 NOTE — Telephone Encounter (Signed)
 I spoke to the pt about scheduling future appts

## 2024-05-08 ENCOUNTER — Encounter: Payer: Self-pay | Admitting: Physician Assistant

## 2024-05-15 ENCOUNTER — Ambulatory Visit (INDEPENDENT_AMBULATORY_CARE_PROVIDER_SITE_OTHER): Admitting: *Deleted

## 2024-05-15 VITALS — BP 164/68 | HR 74 | Temp 97.8°F | Resp 20 | Ht 71.0 in | Wt 302.2 lb

## 2024-05-15 DIAGNOSIS — D500 Iron deficiency anemia secondary to blood loss (chronic): Secondary | ICD-10-CM | POA: Diagnosis not present

## 2024-05-15 MED ORDER — SODIUM CHLORIDE 0.9 % IV SOLN
510.0000 mg | Freq: Once | INTRAVENOUS | Status: AC
  Administered 2024-05-15: 510 mg via INTRAVENOUS
  Filled 2024-05-15: qty 17

## 2024-05-15 NOTE — Progress Notes (Signed)
 Diagnosis: Iron  Deficiency Anemia  Provider:  Mannam, Praveen MD  Procedure: IV Infusion  IV Type: Peripheral, IV Location: L Antecubital  Feraheme  (Ferumoxytol ), Dose: 510 mg  Infusion Start Time: 1032  Infusion Stop Time: 1050  Post Infusion IV Care: Observation period completed and Peripheral IV Discontinued  Discharge: Condition: Good, Destination: Home . AVS Declined  Performed by:  Everest Brod E, RN

## 2024-05-22 ENCOUNTER — Ambulatory Visit

## 2024-07-23 ENCOUNTER — Inpatient Hospital Stay

## 2024-08-13 ENCOUNTER — Ambulatory Visit: Admitting: Endocrinology

## 2024-10-23 ENCOUNTER — Inpatient Hospital Stay

## 2024-10-23 ENCOUNTER — Inpatient Hospital Stay: Admitting: Physician Assistant
